# Patient Record
Sex: Female | Born: 1955
Health system: Southern US, Community
[De-identification: ages and names within clinical notes are randomized; demographics above are authoritative.]

## PROBLEM LIST (undated history)

## (undated) DIAGNOSIS — J45909 Unspecified asthma, uncomplicated: Secondary | ICD-10-CM

## (undated) DIAGNOSIS — I499 Cardiac arrhythmia, unspecified: Secondary | ICD-10-CM

## (undated) DIAGNOSIS — G4730 Sleep apnea, unspecified: Secondary | ICD-10-CM

## (undated) DIAGNOSIS — K219 Gastro-esophageal reflux disease without esophagitis: Secondary | ICD-10-CM

## (undated) DIAGNOSIS — F79 Unspecified intellectual disabilities: Secondary | ICD-10-CM

## (undated) DIAGNOSIS — J690 Pneumonitis due to inhalation of food and vomit: Secondary | ICD-10-CM

## (undated) DIAGNOSIS — I459 Conduction disorder, unspecified: Secondary | ICD-10-CM

## (undated) DIAGNOSIS — E039 Hypothyroidism, unspecified: Secondary | ICD-10-CM

## (undated) DIAGNOSIS — M1990 Unspecified osteoarthritis, unspecified site: Secondary | ICD-10-CM

## (undated) DIAGNOSIS — Z950 Presence of cardiac pacemaker: Secondary | ICD-10-CM

## (undated) DIAGNOSIS — I82439 Acute embolism and thrombosis of unspecified popliteal vein: Secondary | ICD-10-CM

## (undated) DIAGNOSIS — R569 Unspecified convulsions: Secondary | ICD-10-CM

## (undated) DIAGNOSIS — Q909 Down syndrome, unspecified: Secondary | ICD-10-CM

## (undated) DIAGNOSIS — I2699 Other pulmonary embolism without acute cor pulmonale: Secondary | ICD-10-CM

## (undated) HISTORY — DX: Hypothyroidism, unspecified: E03.9

## (undated) HISTORY — DX: Sleep apnea, unspecified: G47.30

## (undated) HISTORY — PX: TIBIA FRACTURE SURGERY: SHX806

## (undated) HISTORY — DX: Cardiac arrhythmia, unspecified: I49.9

## (undated) HISTORY — DX: Acute embolism and thrombosis of unspecified popliteal vein: I82.439

## (undated) HISTORY — DX: Unspecified osteoarthritis, unspecified site: M19.90

## (undated) HISTORY — DX: Gastro-esophageal reflux disease without esophagitis: K21.9

## (undated) HISTORY — DX: Unspecified asthma, uncomplicated: J45.909

## (undated) HISTORY — PX: PEG TUBE PLACEMENT: SUR1034

## (undated) HISTORY — DX: Presence of cardiac pacemaker: Z95.0

---

## 2015-12-09 HISTORY — PX: PACEMAKER INSERTION: SHX728

## 2016-05-10 DIAGNOSIS — Q909 Down syndrome, unspecified: Secondary | ICD-10-CM | POA: Insufficient documentation

## 2016-05-10 DIAGNOSIS — E039 Hypothyroidism, unspecified: Secondary | ICD-10-CM | POA: Insufficient documentation

## 2016-05-10 DIAGNOSIS — Z931 Gastrostomy status: Secondary | ICD-10-CM | POA: Insufficient documentation

## 2016-05-10 DIAGNOSIS — Z950 Presence of cardiac pacemaker: Secondary | ICD-10-CM | POA: Insufficient documentation

## 2016-05-10 DIAGNOSIS — Z86711 Personal history of pulmonary embolism: Secondary | ICD-10-CM | POA: Insufficient documentation

## 2016-05-10 DIAGNOSIS — D6489 Other specified anemias: Secondary | ICD-10-CM | POA: Insufficient documentation

## 2016-05-10 DIAGNOSIS — R748 Abnormal levels of other serum enzymes: Secondary | ICD-10-CM | POA: Insufficient documentation

## 2016-05-11 ENCOUNTER — Emergency Department (HOSPITAL_BASED_OUTPATIENT_CLINIC_OR_DEPARTMENT_OTHER): Payer: Medicare (Managed Care)

## 2016-05-11 ENCOUNTER — Emergency Department (HOSPITAL_BASED_OUTPATIENT_CLINIC_OR_DEPARTMENT_OTHER)
Admission: EM | Admit: 2016-05-11 | Discharge: 2016-05-11 | Disposition: A | Discharge status: Home / Self Care | Payer: Medicare (Managed Care) | Attending: Emergency Medicine | Admitting: Emergency Medicine

## 2016-05-11 ENCOUNTER — Encounter (HOSPITAL_BASED_OUTPATIENT_CLINIC_OR_DEPARTMENT_OTHER): Payer: Self-pay | Admitting: *Deleted

## 2016-05-11 DIAGNOSIS — Y929 Unspecified place or not applicable: Secondary | ICD-10-CM | POA: Diagnosis not present

## 2016-05-11 DIAGNOSIS — Q909 Down syndrome, unspecified: Secondary | ICD-10-CM | POA: Insufficient documentation

## 2016-05-11 DIAGNOSIS — S0512XA Contusion of eyeball and orbital tissues, left eye, initial encounter: Secondary | ICD-10-CM

## 2016-05-11 DIAGNOSIS — Y939 Activity, unspecified: Secondary | ICD-10-CM | POA: Diagnosis not present

## 2016-05-11 DIAGNOSIS — Z950 Presence of cardiac pacemaker: Secondary | ICD-10-CM | POA: Diagnosis not present

## 2016-05-11 DIAGNOSIS — Z79899 Other long term (current) drug therapy: Secondary | ICD-10-CM | POA: Insufficient documentation

## 2016-05-11 DIAGNOSIS — R569 Unspecified convulsions: Secondary | ICD-10-CM | POA: Diagnosis present

## 2016-05-11 DIAGNOSIS — Y999 Unspecified external cause status: Secondary | ICD-10-CM | POA: Diagnosis not present

## 2016-05-11 DIAGNOSIS — H05232 Hemorrhage of left orbit: Secondary | ICD-10-CM | POA: Insufficient documentation

## 2016-05-11 DIAGNOSIS — G40909 Epilepsy, unspecified, not intractable, without status epilepticus: Secondary | ICD-10-CM | POA: Insufficient documentation

## 2016-05-11 DIAGNOSIS — W19XXXA Unspecified fall, initial encounter: Secondary | ICD-10-CM

## 2016-05-11 DIAGNOSIS — W07XXXA Fall from chair, initial encounter: Secondary | ICD-10-CM | POA: Insufficient documentation

## 2016-05-11 HISTORY — DX: Other pulmonary embolism without acute cor pulmonale: I26.99

## 2016-05-11 HISTORY — DX: Conduction disorder, unspecified: I45.9

## 2016-05-11 HISTORY — DX: Pneumonitis due to inhalation of food and vomit: J69.0

## 2016-05-11 HISTORY — DX: Unspecified convulsions: R56.9

## 2016-05-11 HISTORY — DX: Down syndrome, unspecified: Q90.9

## 2016-05-11 HISTORY — DX: Unspecified intellectual disabilities: F79

## 2016-05-11 LAB — CBC WITH DIFFERENTIAL/PLATELET
BASOS ABS: 0.1 10*3/uL (ref 0.0–0.1)
Basophils Relative: 1 %
Eosinophils Absolute: 0.1 10*3/uL (ref 0.0–0.7)
Eosinophils Relative: 2 %
HEMATOCRIT: 32.3 % — AB (ref 36.0–46.0)
Hemoglobin: 10.1 g/dL — ABNORMAL LOW (ref 12.0–15.0)
LYMPHS ABS: 1.1 10*3/uL (ref 0.7–4.0)
LYMPHS PCT: 20 %
MCH: 34.1 pg — AB (ref 26.0–34.0)
MCHC: 31.3 g/dL (ref 30.0–36.0)
MCV: 109.1 fL — AB (ref 78.0–100.0)
MONO ABS: 0.5 10*3/uL (ref 0.1–1.0)
Monocytes Relative: 10 %
NEUTROS ABS: 3.7 10*3/uL (ref 1.7–7.7)
Neutrophils Relative %: 67 %
Platelets: 128 10*3/uL — ABNORMAL LOW (ref 150–400)
RBC: 2.96 MIL/uL — AB (ref 3.87–5.11)
RDW: 17.2 % — AB (ref 11.5–15.5)
WBC: 5.4 10*3/uL (ref 4.0–10.5)

## 2016-05-11 LAB — BASIC METABOLIC PANEL
ANION GAP: 9 (ref 5–15)
BUN: 21 mg/dL — AB (ref 6–20)
CO2: 29 mmol/L (ref 22–32)
Calcium: 9.2 mg/dL (ref 8.9–10.3)
Chloride: 98 mmol/L — ABNORMAL LOW (ref 101–111)
Creatinine, Ser: 1.13 mg/dL — ABNORMAL HIGH (ref 0.44–1.00)
GFR calc Af Amer: >60 mL/min (ref >60)
GFR calc non Af Amer: 52 mL/min — ABNORMAL LOW (ref >60)
GLUCOSE: 94 mg/dL (ref 65–99)
POTASSIUM: 4.2 mmol/L (ref 3.5–5.1)
Sodium: 136 mmol/L (ref 135–145)

## 2016-05-11 LAB — CBG MONITORING, ED: GLUCOSE-CAPILLARY: 110 mg/dL — AB (ref 65–99)

## 2016-05-11 NOTE — ED Provider Notes (Signed)
[facility] Provider Note   CSN: [id number] Arrival date & time: 05/11/16  1840     History   Chief Complaint Chief Complaint  Patient presents with  . Fall    HPI Kaitlyn Powell is a 60 y.o. female.  Patient with history of Down syndrome, recent pulmonary embolism on Lovenox, aspiration pneumonia, and seizures presents to the emergency department with chief complaint of seizures. She has company by her family members, state that she had a sudden seizure today. They state that she has occasional seizures despite taking Dilantin. She has been compliant in taking her medications. The seizure lasted only a few seconds. However, the patient's family members were unable to catch the patient, and she fell striking her left forehead. This is the reason that brought her for further evaluation the emergency department today. There are no other associated symptoms.   The history is provided by the patient. No language interpreter was used.    Past Medical History:  Diagnosis Date  . Aspiration pneumonia (HCC)   . Down syndrome   . Heart block   . Mental retardation   . Pulmonary emboli (HCC)   . Seizures (HCC)     There are no active problems to display for this patient.   Past Surgical History:  Procedure Laterality Date  . PACEMAKER INSERTION    . PEG TUBE PLACEMENT    . TIBIA FRACTURE SURGERY Right     OB History    No data available       Home Medications    Prior to Admission medications   Medication Sig Start Date End Date Taking? Authorizing Provider  citalopram (CELEXA) 40 MG tablet Take 40 mg by mouth daily.   Yes Historical Provider, MD  folic acid (FOLVITE) 1 MG tablet Take 1 mg by mouth daily.   Yes Historical Provider, MD  levothyroxine (SYNTHROID, LEVOTHROID) 100 MCG tablet Take 100 mcg by mouth daily before breakfast.   Yes Historical Provider, MD  Melatonin 1 MG/4ML LIQD Take by mouth.   Yes Historical Provider, MD  phenytoin (DILANTIN)  125 MG/5ML suspension Take by mouth 3 (three) times daily.   Yes Historical Provider, MD  simvastatin (ZOCOR) 40 MG tablet Take 40 mg by mouth daily.   Yes Historical Provider, MD  vitamin B-12 (CYANOCOBALAMIN) 100 MCG tablet Take 100 mcg by mouth daily.   Yes Historical Provider, MD    Family History History reviewed. No pertinent family history.  Social History Social History  Substance Use Topics  . Smoking status: Never Smoker  . Smokeless tobacco: Never Used  . Alcohol use No     Allergies   Review of patient's allergies indicates no known allergies.   Review of Systems Review of Systems  Skin: Positive for wound.  Neurological: Positive for seizures.  All other systems reviewed and are negative.    Physical Exam Updated Vital Signs BP 108/56 (BP Location: Right Arm)   Pulse 69   Temp 97.8 F (36.6 C) (Oral)   Resp 18   Ht 4\' 10"  (1.473 m)   Wt 77.1 kg   SpO2 99%   BMI 35.53 kg/m   Physical Exam  Constitutional: She is oriented to person, place, and time. She appears well-developed and well-nourished.  Characteristic features of Down syndrome  HENT:  Head: Normocephalic and atraumatic.  Left supraorbital hematoma, with moderate tenderness to palpation  Eyes: Conjunctivae and EOM are normal. Pupils are equal, round, and reactive to light.  No  evidence of entrapment  Neck: Normal range of motion. Neck supple.  Cardiovascular: Normal rate and regular rhythm.  Exam reveals no gallop and no friction rub.   No murmur heard. Pulmonary/Chest: Effort normal and breath sounds normal. No respiratory distress. She has no wheezes. She has no rales. She exhibits no tenderness.  Lungs sound clear on exam  Abdominal: Soft. Bowel sounds are normal. She exhibits no distension and no mass. There is no tenderness. There is no rebound and no guarding.  Musculoskeletal: Normal range of motion. She exhibits no edema or tenderness.  Moves all extremities throughout range of  motion  Neurological: She is alert and oriented to person, place, and time.  Skin: Skin is warm and dry.  Psychiatric: She has a normal mood and affect. Her behavior is normal. Judgment and thought content normal.  Nursing note and vitals reviewed.    ED Treatments / Results  Labs (all labs ordered are listed, but only abnormal results are displayed) Labs Reviewed  CBC WITH DIFFERENTIAL/PLATELET - Abnormal; Notable for the following:       Result Value   RBC 2.96 (*)    Hemoglobin 10.1 (*)    HCT 32.3 (*)    MCV 109.1 (*)    MCH 34.1 (*)    RDW 17.2 (*)    Platelets 128 (*)    All other components within normal limits  BASIC METABOLIC PANEL - Abnormal; Notable for the following:    Chloride 98 (*)    BUN 21 (*)    Creatinine, Ser 1.13 (*)    GFR calc non Af Amer 52 (*)    All other components within normal limits  CBG MONITORING, ED - Abnormal; Notable for the following:    Glucose-Capillary 110 (*)    All other components within normal limits    EKG  EKG Interpretation  Date/Time:  Monday May 11 2016 18:56:58 EDT Ventricular Rate:  62 PR Interval:    QRS Duration: 92 QT Interval:  431 QTC Calculation: 438 R Axis:   86 Text Interpretation:  Sinus rhythm Biatrial enlargement Low voltage, precordial leads Confirmed by DELO  MD, DOUGLAS ([id number]) on 05/11/2016 7:02:15 PM       Radiology Dg Chest 2 View  Result Date: 05/11/2016 CLINICAL DATA:  Status post seizure and fall out of chair, with central chest pain and shortness of breath. Initial encounter. EXAM: CHEST  2 VIEW COMPARISON:  None. FINDINGS: Vascular congestion is noted, with central airspace opacification, concerning for pulmonary edema. No pleural effusion or pneumothorax is seen. The heart is borderline normal in size; a pacemaker is noted at the right chest wall, with leads ending overlying the right atrium and right ventricle. No acute osseous abnormalities are identified. There is chronic deformity of  the distal left clavicle. IMPRESSION: Vascular congestion, with central airspace opacification, concerning for pulmonary edema. This may be transient in nature. No displaced rib fracture seen. Electronically Signed   By: Roanna Raider M.D.   On: 05/11/2016 20:12   Dg Abdomen 1 View  Result Date: 05/11/2016 CLINICAL DATA:  Status post fall out of chair, left-sided abdominal pain. Seizure. Initial encounter. EXAM: ABDOMEN - 1 VIEW COMPARISON:  None. FINDINGS: The patient's G-tube is noted overlying the left upper quadrant. Its position is not well assessed on supine images. The visualized bowel gas pattern is grossly unremarkable, with a moderate amount of stool noted in the colon. No free abdominal air is seen, though evaluation for free air is limited  on supine views. Mild degenerative change is noted at the lower lumbar spine. The visualized lung bases are grossly clear. IMPRESSION: 1. G-tube noted ending overlying the left upper quadrant. 2. Unremarkable bowel gas pattern; no free intra-abdominal air seen. Moderate amount of stool noted in the colon. Electronically Signed   By: Roanna RaiderJeffery  Chang M.D.   On: 05/11/2016 20:11   Ct Head Wo Contrast  Result Date: 05/11/2016 CLINICAL DATA:  60 year old female with Down's syndrome and intellectual disability with witnessed seizure falling out of chair and hitting head. On blood thinner. Initial encounter. EXAM: CT HEAD WITHOUT CONTRAST TECHNIQUE: Contiguous axial images were obtained from the base of the skull through the vertex without intravenous contrast. COMPARISON:  None. FINDINGS: Brain: No intracranial hemorrhage. Chronic microvascular changes without CT evidence of large acute infarct. Moderate global atrophy without hydrocephalus. No intracranial mass lesion noted on this unenhanced exam. Vascular: Mild vascular calcifications. Skull: Prominent hematoma left frontal/ supraorbital region without underlying skull fracture. Sinuses/Orbits: Globes appear to be  grossly intact. Minimal mucosal thickening right maxillary sinus. Other: Degenerative changes temporomandibular joint. Congenital incomplete fusion C1 ring. Mild dental disease. IMPRESSION: Large left frontal/ supraorbital subcutaneous hematoma without underlying fracture or intracranial hemorrhage. The globe appears to be grossly intact. Moderate global atrophy. Chronic microvascular changes without CT evidence of large acute infarct. Electronically Signed   By: Lacy DuverneySteven  Olson M.D.   On: 05/11/2016 19:32    Procedures Procedures (including critical care time)  Medications Ordered in ED Medications - No data to display   Initial Impression / Assessment and Plan / ED Course  I have reviewed the triage vital signs and the nursing notes.  Pertinent labs & imaging results that were available during my care of the patient were reviewed by me and considered in my medical decision making (see chart for details).  Clinical Course    Patient presents to ED after seizure. She has a hematoma to her left eyebrow. CT scan is negative. PEG tube in place (family was concerned about placement). Chest x-ray shows vascular congestion and opacification. Patient is currently being treated for PE and aspiration pneumonia.  Patient seen by and discussed with Dr. Judd Lienelo, who states that the patient can be discharged. She is well appearing despite her hematoma over her left eye. She is alert and oriented, and seems to be her typical self per the family members.  It is noted that the patient had a few episodes. Oxygen desaturation while on the monitor in the emergency department. This was witnessed by Dr. Judd Lienelo, who states the patient did not have a good waveform at the time, and questions true desaturation.  He states that as patient looks well and sounds clear, she can be discharged.  Final Clinical Impressions(s) / ED Diagnoses   Final diagnoses:  Fall, initial encounter  Traumatic hematoma of left orbit, initial  encounter  Seizure [facility](HCC)    New Prescriptions New Prescriptions   No medications on file     Roxy HorsemanRobert Murray Guzzetta, PA-C 05/11/16 2127    Geoffery Lyonsouglas Delo, MD 05/11/16 2259

## 2016-05-11 NOTE — ED Triage Notes (Addendum)
Pt has a hx of seizures.  Had a witnessed seizure tonight.  Reports that she fell out of the chair and hit her head.  States that she is on Lovenox due to a PE.  Noted to have a left hematoma.

## 2016-05-28 ENCOUNTER — Ambulatory Visit (HOSPITAL_COMMUNITY): Payer: Medicare (Managed Care)

## 2016-07-03 DIAGNOSIS — R569 Unspecified convulsions: Secondary | ICD-10-CM | POA: Insufficient documentation

## 2016-07-03 DIAGNOSIS — H9193 Unspecified hearing loss, bilateral: Secondary | ICD-10-CM | POA: Insufficient documentation

## 2016-07-06 ENCOUNTER — Telehealth: Payer: Self-pay

## 2016-07-06 NOTE — Telephone Encounter (Signed)
SENT NOTES TO SCHEDULING 

## 2016-07-15 ENCOUNTER — Ambulatory Visit (INDEPENDENT_AMBULATORY_CARE_PROVIDER_SITE_OTHER): Payer: Medicare (Managed Care) | Admitting: Podiatry

## 2016-07-15 ENCOUNTER — Encounter: Payer: Self-pay | Admitting: Podiatry

## 2016-07-15 VITALS — BP 110/73 | HR 52 | Ht <= 58 in | Wt 163.0 lb

## 2016-07-15 DIAGNOSIS — B351 Tinea unguium: Secondary | ICD-10-CM

## 2016-07-15 DIAGNOSIS — L608 Other nail disorders: Secondary | ICD-10-CM

## 2016-07-15 DIAGNOSIS — M79609 Pain in unspecified limb: Secondary | ICD-10-CM | POA: Diagnosis not present

## 2016-07-15 DIAGNOSIS — L603 Nail dystrophy: Secondary | ICD-10-CM

## 2016-07-15 DIAGNOSIS — Z79899 Other long term (current) drug therapy: Secondary | ICD-10-CM

## 2016-07-15 NOTE — Progress Notes (Signed)
Subjective: Patient presents today for possible treatment and evaluation of fungal nails bilaterally 1 through 5. Patient states that the nails have been discolored and thickened for greater than 1 month. Patient presents today for further treatment and evaluation.  Objective: Physical Exam General: The patient is alert and oriented x3 in no acute distress.  Dermatology: Hyperkeratotic, discolored, thickened, onychodystrophy of nails noted bilaterally.  Skin is warm, dry and supple bilateral lower extremities. Negative for open lesions or macerations.  Vascular: Palpable pedal pulses bilaterally. No edema or erythema noted. Capillary refill within normal limits.  Neurological: Epicritic and protective threshold grossly intact bilaterally.   Musculoskeletal Exam: Range of motion within normal limits to all pedal and ankle joints bilateral. Muscle strength 5/5 in all groups bilateral.   Assessment: #1 onychodystrophy bilateral toenails #2 possible onychomycosis #3 hyperkeratotic nails bilateral  Plan of Care:  #1 Patient was evaluated. #2 Orders for liver function tests were ordered today.  #3 Today nail biopsy was taken and sent to pathology for fungal culture. #5 patient is to return to clinic in 4 weeks to discuss fungal culture nail biopsy findings and LFT results and discuss different treatment options. #6 if nail fungal culture is positive for fungus we will contact the patient and let her know fungal culture results #7 today mechanical debridement of nails 1 through 5 bilaterally was performed using a nail nipper without incident or bleeding. Nails were smoothed down with a rotary dremel  Felecia ShellingBrent M. Marili Vader, DPM [facility]  Dr. Felecia ShellingBrent M. Vineta Carone, DPM   [address]                                        [place], [place] [address]                Office [phone]  Fax [phone]

## 2016-07-16 NOTE — Addendum Note (Signed)
Addended by: Renaldo ReelPARRY, MELODY A on: 07/16/2016 12:24 PM   Modules accepted: Orders

## 2016-07-22 ENCOUNTER — Encounter: Payer: Self-pay | Admitting: Internal Medicine

## 2016-07-22 ENCOUNTER — Ambulatory Visit
Admission: RE | Admit: 2016-07-22 | Discharge: 2016-07-22 | Disposition: A | Discharge status: Home / Self Care | Payer: Medicare Other | Source: Ambulatory Visit | Attending: Internal Medicine | Admitting: Internal Medicine

## 2016-07-22 ENCOUNTER — Other Ambulatory Visit: Payer: Self-pay | Admitting: Podiatry

## 2016-07-22 ENCOUNTER — Ambulatory Visit (INDEPENDENT_AMBULATORY_CARE_PROVIDER_SITE_OTHER): Payer: Medicare Other | Admitting: Internal Medicine

## 2016-07-22 VITALS — BP 106/70 | HR 64 | Ht <= 58 in | Wt 167.6 lb

## 2016-07-22 DIAGNOSIS — I442 Atrioventricular block, complete: Secondary | ICD-10-CM

## 2016-07-22 DIAGNOSIS — T82110A Breakdown (mechanical) of cardiac electrode, initial encounter: Secondary | ICD-10-CM

## 2016-07-22 DIAGNOSIS — I459 Conduction disorder, unspecified: Secondary | ICD-10-CM | POA: Diagnosis not present

## 2016-07-22 NOTE — Progress Notes (Signed)
ANDY,CAMILLE L, MD:  Kaitlyn MccreedyJeanne L Powell is a 60 y.o. female with a h/o down syndrome and transient complete heart block with syncope sp PPM (BSc) in [place] who presents today to establish care in the Electrophysiology device clinic.  She was quite ill with aspiration pneumonia 8/17.  She has had a slow recovery and has [place] to [place] to live with her sister Kaitlyn BruinsMary Zillich.   She is now doing better.  She has had no further syncope since her PPM implant.  Review of notes from [place] reveal that she has had elevated RV threshold since early June but does not RV pace very often.  Today, she  denies symptoms of palpitations, chest pain, shortness of breath, orthopnea, PND, lower extremity edema, dizziness, presyncope, syncope, or neurologic sequela.  The patientis tolerating medications without difficulties and is otherwise without complaint today.   Past Medical History:  Diagnosis Date  . Aspiration pneumonia (HCC)   . Asthma   . Down syndrome   . GERD (gastroesophageal reflux disease)   . Heart block   . Mental retardation   . Pulmonary emboli (HCC)   . Seizures (HCC)    Past Surgical History:  Procedure Laterality Date  . PACEMAKER INSERTION  12/09/2015   [employer] Accolade MRI EL PPM implanted in [place] for complete heart block and syncope  . PEG TUBE PLACEMENT    . TIBIA FRACTURE SURGERY Right     Social History   Social History  . Marital status: Single    Spouse name: N/A  . Number of children: N/A  . Years of education: N/A   Occupational History  . Not on file.   Social History Main Topics  . Smoking status: Never Smoker  . Smokeless tobacco: Never Used  . Alcohol use No  . Drug use: No  . Sexual activity: Not on file   Other Topics Concern  . Not on file   Social History Narrative  . No narrative on file    No family history on file.  Allergies  Allergen Reactions  . Cephalexin Other (See Comments)    Seizures     Current Outpatient  Prescriptions  Medication Sig Dispense Refill  . citalopram (CELEXA) 20 MG tablet Take one tablet  by mouth  daily    . famotidine (PEPCID) 40 MG/5ML suspension Take 20 mg  by mouth daily    . folic acid (FOLVITE) 1 MG tablet Take 1 mg  by mouth  daily    . ipratropium-albuterol (DUONEB) 0.5-2.5 (3) MG/3ML SOLN Take 1 ampule by nebulization 2 (two) times daily.    Marland Kitchen. levothyroxine (SYNTHROID, LEVOTHROID) 150 MCG tablet Take one tablet (150 mcg total)  by mouth  daily    . Melatonin 1 MG/4ML LIQD Take 3 mg  by mouth  at bedtime    . OXYGEN Inhale 2 L into the lungs as directed. With CPAP    . phenytoin (DILANTIN) 200 MG ER capsule Take 200 mg  by mouth  twice daily    . risperiDONE (RISPERDAL) 1 MG tablet Take 1 mg by mouth every morning and 2 mg at night    . simvastatin (ZOCOR) 40 MG tablet Take 40 mg  by mouth  at bedtime    . vitamin B-12 (CYANOCOBALAMIN) 100 MCG tablet Take 100 mcg by mouth daily     No current facility-administered medications for this visit.     ROS- all systems are reviewed and negative except  as per HPI  Physical Exam: Vitals:   07/22/16 1449  BP: 106/70  Pulse: 64  Weight: 167 lb 9.6 oz (76 kg)  Height: 4\' 10"  (1.473 m)    GEN- The patient is pleasant appearing, alert and oriented x 3 today.   Head- normocephalic, atraumatic Eyes-  Sclera clear, conjunctiva pink Ears- hearing intact Oropharynx- clear with poor dentition Neck- supple, no JVP Lungs- Clear to ausculation bilaterally, normal work of breathing Chest- pacemaker pocket is well healed Heart- Regular rate and rhythm, no murmurs, rubs or gallops, PMI not laterally displaced GI- soft, NT, ND, + BS Extremities- no clubbing, cyanosis, or edema MS- no significant deformity or atrophy Skin- no rash or lesion Psych- euthymic mood, full affect Neuro- strength and sensation are intact  Pacemaker interrogation- reviewed in detail today,  See PACEART report  Assessment and Plan:  1. Transient  complete heart block with syncope Doing well s/p PPM implant No further episodes Normal pacemaker function though RV threshold appears chronically elevated (3V @0 .4 msec since June 2017). See [employer] report No changes today Will obtain CXR to evaluate RV lead placement No plans for lead revision at this time.  Return to see EP NP in 6 months I will see in a year [employer] remote monitoring  Hillis RangeJames Annya Lizana MD, [facility] 07/22/2016 3:25 PM

## 2016-07-22 NOTE — Patient Instructions (Addendum)
Medication Instructions:  Your physician recommends that you continue on your current medications as directed. Please refer to the Current Medication list given to you today.   Labwork: None ordered   Testing/Procedures: A chest x-ray takes a picture of the organs and structures inside the chest, including the heart, lungs, and blood vessels. This test can show several things, including, whether the heart is enlarges; whether fluid is building up in the lungs; and whether pacemaker / defibrillator leads are still in place.    Follow-Up: Remote monitoring is used to monitor your Pacemaker  from home. This monitoring reduces the number of office visits required to check your device to one time per year. It allows us to keep an eye on the functioning of your device to ensure it is working properly. You are scheduled for a device check from home on 10/21/16. You may send your transmission at any time that day. If you have a wireless device, the transmission will be sent automatically. After your physician reviews your transmission, you will receive a postcard with your next transmission date.   Your physician wants you to follow-up in: 6 months with Kaitlyn BalsamAmber Seiler, NP You will receive a reminder letter in the mail two months in advance. If you don't receive a letter, please call our office to schedule the follow-up appointment.     Any Other Special Instructions Will Be Listed Below (If Applicable).     If you need a refill on your cardiac medications before your next appointment, please call your pharmacy.

## 2016-07-23 LAB — HEPATIC FUNCTION PANEL
ALBUMIN: 3.5 g/dL — AB (ref 3.6–4.8)
ALT: 10 IU/L (ref 0–32)
AST: 18 IU/L (ref 0–40)
Alkaline Phosphatase: 117 IU/L (ref 39–117)
Bilirubin, Direct: 0.08 mg/dL (ref 0.00–0.40)
TOTAL PROTEIN: 7.7 g/dL (ref 6.0–8.5)

## 2016-07-28 ENCOUNTER — Telehealth: Payer: Self-pay

## 2016-07-28 NOTE — Telephone Encounter (Signed)
Her results are in and are postive for fungus, look under labs for [facility] results

## 2016-07-28 NOTE — Telephone Encounter (Signed)
Patient calling to see if hepatic function panel results were back.

## 2016-07-28 NOTE — Telephone Encounter (Signed)
I don't see that her nail biopsy fungal culture results are available yet. Do not start on oral Terbinafine until pathology comes back.  Dr. Logan BoresEvans

## 2016-07-29 ENCOUNTER — Other Ambulatory Visit: Payer: Self-pay

## 2016-07-29 MED ORDER — TERBINAFINE HCL 250 MG PO TABS: 250.0000 mg | ORAL_TABLET | Freq: Every day | ORAL | 2 refills | Status: DC

## 2016-08-05 DIAGNOSIS — H906 Mixed conductive and sensorineural hearing loss, bilateral: Secondary | ICD-10-CM | POA: Insufficient documentation

## 2016-08-11 ENCOUNTER — Telehealth: Payer: Self-pay | Admitting: Gastroenterology

## 2016-08-11 NOTE — Telephone Encounter (Signed)
Records have been placed on Dr.Nandigam's desk for review. °

## 2016-08-12 ENCOUNTER — Encounter: Payer: Self-pay | Admitting: Gastroenterology

## 2016-08-12 NOTE — Telephone Encounter (Signed)
Dr. Nandigam reviewed records and has accepted patient. Ok to schedule OV. Left message for patient to return my call. °

## 2016-08-27 LAB — CUP PACEART INCLINIC DEVICE CHECK
Brady Statistic RA Percent Paced: 7 %
Brady Statistic RV Percent Paced: 1 % — CL
Date Time Interrogation Session: 20171220050000
Implantable Lead Implant Date: 20170508
Implantable Lead Location: 753859
Implantable Lead Serial Number: 1111
Lead Channel Pacing Threshold Amplitude: 0.8 V
Lead Channel Pacing Threshold Amplitude: 3.3 V
Lead Channel Pacing Threshold Pulse Width: 0.4 ms
Lead Channel Sensing Intrinsic Amplitude: 6.7 mV
Lead Channel Setting Pacing Amplitude: 5 V
Lead Channel Setting Pacing Pulse Width: 0.4 ms
MDC IDC LEAD IMPLANT DT: 20170508
MDC IDC LEAD LOCATION: 753860
MDC IDC LEAD SERIAL: 1111
MDC IDC MSMT LEADCHNL RA IMPEDANCE VALUE: 579 Ohm
MDC IDC MSMT LEADCHNL RV IMPEDANCE VALUE: 657 Ohm
MDC IDC MSMT LEADCHNL RV PACING THRESHOLD PULSEWIDTH: 0.4 ms
MDC IDC MSMT LEADCHNL RV SENSING INTR AMPL: 17.8 mV
MDC IDC PG IMPLANT DT: 20170508
MDC IDC SET LEADCHNL RA PACING AMPLITUDE: 2.5 V
MDC IDC SET LEADCHNL RV SENSING SENSITIVITY: 2.5 mV
Pulse Gen Serial Number: 747619

## 2016-09-02 ENCOUNTER — Ambulatory Visit (INDEPENDENT_AMBULATORY_CARE_PROVIDER_SITE_OTHER): Payer: Medicare Other | Admitting: Gastroenterology

## 2016-09-02 ENCOUNTER — Encounter: Payer: Self-pay | Admitting: Gastroenterology

## 2016-09-02 VITALS — BP 94/58 | HR 52 | Ht <= 58 in | Wt 161.0 lb

## 2016-09-02 DIAGNOSIS — K5901 Slow transit constipation: Secondary | ICD-10-CM

## 2016-09-02 DIAGNOSIS — R748 Abnormal levels of other serum enzymes: Secondary | ICD-10-CM

## 2016-09-02 DIAGNOSIS — R1312 Dysphagia, oropharyngeal phase: Secondary | ICD-10-CM

## 2016-09-02 DIAGNOSIS — Q909 Down syndrome, unspecified: Secondary | ICD-10-CM

## 2016-09-02 MED ORDER — LINACLOTIDE 72 MCG PO CAPS: 72.0000 ug | ORAL_CAPSULE | Freq: Every day | ORAL | 3 refills | Status: DC

## 2016-09-02 NOTE — Patient Instructions (Signed)
We have give you samples of Linzess 72 mcg to take 1 daily  Follow up as needed

## 2016-09-02 NOTE — Progress Notes (Signed)
Kaitlyn Powell    [id number]    [birth year]  Primary Care Physician:ANDY,CAMILLE L, MD  Referring Physician: Leamon Arnt, MD [address] [place] [place], [place] [address]  Chief complaint:  Constipation   HPI:  10 y is F with Down's syndrome with oropharyngeal dysphagia is here to establish care. Patient has relocated to [facility] and was previously followed by GI in [facility] He was admitted to the hospital in [facility] in September with aspiration pneumonia, pulmonary embolus and had  percutaneous gastrostomy at the time. PEG tube was pulled out in Oct, 2017, as was noted to have infection of the PEG tube site,  was transiently on to feeds through NG Dobhoff for 2 weeks and later she passed swallow test and is currently on pureed diet Other comorbidities include hypothyroidism, DVT, pulmonary embolism treated with anticoagulation for 3 months, Aspiration pneumonia, asthma, obstructive sleep apnea, sick sinus syndrome s/p pacemaker Most recent labs 05/12/16: WBC 5.0, Hgb 10.3, HCT 32.9, Platelets 166, Cr 1.07, Albumin 3.5, T.Bili 0.2, AST 20, ALT 12, Alk Phos 105, Lipase 220, Vit B12 882 She has been off anticoagulation since November 2017. Tolerating by mouth intake well. Denies any nausea, vomiting, abdominal pain, melena or bright red blood per rectum She is accompanied by her sister who is her caregiver, she is concerned about irregular bowel habits and constipation. She is getting MiraLAX and senna as needed with 2-3 bowel movements a week.    Constipation:   Outpatient Encounter Prescriptions as of 09/02/2016  Medication Sig  . citalopram (CELEXA) 20 MG tablet Take one tablet  by mouth  daily  . famotidine (PEPCID) 40 MG/5ML suspension Take 20 mg  by mouth daily  . folic acid (FOLVITE) 1 MG tablet Take 1 mg  by mouth  daily  . ipratropium-albuterol (DUONEB) 0.5-2.5 (3) MG/3ML SOLN Take 1 ampule by nebulization 2 (two) times daily.  Marland Kitchen  levothyroxine (SYNTHROID, LEVOTHROID) 150 MCG tablet Take one tablet (150 mcg total)  by mouth  daily  . Melatonin 1 MG/4ML LIQD Take 3 mg  by mouth  at bedtime  . OXYGEN Inhale 2 L into the lungs as directed. With CPAP  . phenytoin (DILANTIN) 200 MG ER capsule Take 200 mg  by mouth  twice daily  . risperiDONE (RISPERDAL) 1 MG tablet Take 1 mg by mouth every morning and 2 mg at night  . simvastatin (ZOCOR) 40 MG tablet Take 40 mg  by mouth  at bedtime  . vitamin B-12 (CYANOCOBALAMIN) 100 MCG tablet Take 100 mcg by mouth daily  . [DISCONTINUED] terbinafine (LAMISIL) 250 MG tablet Take 1 tablet (250 mg total) by mouth daily.   No facility-administered encounter medications on file as of 09/02/2016.     Allergies as of 09/02/2016 - Review Complete 09/02/2016  Allergen Reaction Noted  . Cephalexin Other (See Comments) 06/04/2016    Past Medical History:  Diagnosis Date  . Arthritis   . Aspiration pneumonia ([place])   . Asthma   . Cardiac arrhythmia    have pacemeaker  . Down syndrome   . GERD (gastroesophageal reflux disease)   . Heart block   . Hypothyroidism   . Mental retardation   . Pulmonary emboli ([place])   . Seizures ([place])   . Sleep apnea with use of continuous positive airway pressure (CPAP)     Past Surgical History:  Procedure Laterality Date  . PACEMAKER INSERTION  12/09/2015   [employer] Accolade MRI EL PPM implanted in [place] for complete heart block and syncope  . PEG TUBE PLACEMENT    . TIBIA FRACTURE SURGERY Right     Family History  Problem Relation Age of Onset  . Diabetes Father   . Heart disease Father   . Breast cancer Sister     Social History   Social History  . Marital status: Single    Spouse name: N/A  . Number of children: N/A  . Years of education: N/A   Occupational History  . Not on file.   Social History Main Topics  . Smoking status: Never Smoker  . Smokeless tobacco: Never Used  . Alcohol use No  . Drug use: No  . Sexual  activity: Not on file   Other Topics Concern  . Not on file   Social History Narrative  . No narrative on file      Review of systems: Review of Systems  Constitutional: Negative for fever and chills.  HENT: Negative.   Eyes: Negative for blurred vision.  Respiratory: Negative for cough, shortness of breath and wheezing.   Cardiovascular: Negative for chest pain and palpitations.  Gastrointestinal: as per HPI Genitourinary: Negative for dysuria, urgency, frequency and hematuria.  Musculoskeletal: Positive for myalgias, back pain and joint pain.  Skin: Negative for itching and rash.  Neurological: Negative for dizziness, tremors, focal weakness, seizures and loss of consciousness.  Endo/Heme/Allergies: Positive for seasonal allergies.  Psychiatric/Behavioral: Negative for depression, suicidal ideas and hallucinations.  All other systems reviewed and are negative.   Physical Exam: Vitals:   09/02/16 0827  BP: (!) 94/58  Pulse: (!) 52   Body mass index is 37.42 kg/m. Gen:      No acute distress HEENT:  EOMI, sclera anicteric Neck:     No masses; no thyromegaly Lungs:    Clear to auscultation bilaterally; normal respiratory effort CV:         Regular rate and rhythm; no murmurs Abd:      + bowel sounds; soft, non-tender; no palpable masses, no distension Ext:    No edema; adequate peripheral perfusion Skin:      Warm and dry; no rash Neuro: alert and oriented x 3 Psych: normal mood and affect  Data Reviewed:  Reviewed labs, radiology imaging, old records and pertinent past GI work up   Assessment and Plan/Recommendations:  61 year-old female with obesity, Down syndrome, hypothyroidism and obstructive sleep apnea is here to establish care  Constipation: Start low-dose linzess 72 g daily, will titrate the dose based on response Increase dietary fluid and fiber  Oropharyngeal dysphagia: She is currently tolerating pured diet  Elevated lipase: Patient has no  symptoms of pancreatitis, noted elevated lipase on prior labs during hospitalization No abdominal pain or renal dysfunction She is currently asymptomatic, no associated LFT abnormality No history of alcohol abuse Could be medication related or idiopathic We'll consider further investigation if she develops any symptoms  Discussed in detail the benefits and risks with colonoscopy for colorectal cancer screening and her sister who is also her caregiver Given her multiple comorbidities including oropharyngeal dysphagia , seizure disorder which puts her at high risk for aspiration, opted to hold off pursuing colonoscopy or any other modality for colorectal cancer screening at this point of time, her sister was in agreement  Greater than 50% of the time used for counseling as well as treatment plan and follow-up. Patient's sister had multiple questions which were  answered to her satisfaction  Damaris Hippo , MD [phone] Mon-Fri 8a-5p [phone] after 5p, weekends, holidays  CC: Leamon Arnt, MD

## 2016-09-21 ENCOUNTER — Encounter: Payer: Self-pay | Admitting: Gastroenterology

## 2016-09-22 ENCOUNTER — Other Ambulatory Visit: Payer: Self-pay

## 2016-09-23 ENCOUNTER — Other Ambulatory Visit: Payer: Self-pay

## 2016-09-23 MED ORDER — SENNA 176 MG/5ML PO SYRP: 10.0000 mL | ORAL_SOLUTION | Freq: Every day | ORAL | 11 refills | Status: DC

## 2016-09-28 ENCOUNTER — Other Ambulatory Visit: Payer: Self-pay

## 2016-09-28 MED ORDER — SENNA 176 MG/5ML PO SYRP: 10.0000 mL | ORAL_SOLUTION | Freq: Every day | ORAL | 11 refills | Status: DC

## 2016-10-14 ENCOUNTER — Ambulatory Visit (INDEPENDENT_AMBULATORY_CARE_PROVIDER_SITE_OTHER): Payer: Medicare Other | Admitting: Podiatry

## 2016-10-14 DIAGNOSIS — M79609 Pain in unspecified limb: Secondary | ICD-10-CM

## 2016-10-14 DIAGNOSIS — B351 Tinea unguium: Secondary | ICD-10-CM | POA: Diagnosis not present

## 2016-10-14 DIAGNOSIS — L608 Other nail disorders: Secondary | ICD-10-CM | POA: Diagnosis not present

## 2016-10-14 DIAGNOSIS — L603 Nail dystrophy: Secondary | ICD-10-CM

## 2016-10-14 MED ORDER — NONFORMULARY OR COMPOUNDED ITEM
1.0000 [drp] | Freq: Every day | 2 refills | Status: DC
Start: 2016-10-14 — End: 2017-01-11

## 2016-10-21 ENCOUNTER — Telehealth: Payer: Self-pay | Admitting: Cardiology

## 2016-10-21 ENCOUNTER — Ambulatory Visit (INDEPENDENT_AMBULATORY_CARE_PROVIDER_SITE_OTHER): Payer: Medicare Other | Admitting: *Deleted

## 2016-10-21 DIAGNOSIS — I442 Atrioventricular block, complete: Secondary | ICD-10-CM

## 2016-10-21 NOTE — Telephone Encounter (Signed)
Confirmed remote transmission w/ pt sister.   

## 2016-10-21 NOTE — Progress Notes (Signed)
Remote pacemaker transmission.   

## 2016-10-22 ENCOUNTER — Encounter: Payer: Self-pay | Admitting: Cardiology

## 2016-10-24 NOTE — Progress Notes (Signed)
   SUBJECTIVE Patient  presents to office today complaining of elongated, thickened nails. Pain while ambulating in shoes. Patient is unable to trim their own nails.   OBJECTIVE General Patient is awake, alert, and oriented x 3 and in no acute distress. Derm Skin is dry and supple bilateral. Negative open lesions or macerations. Remaining integument unremarkable. Nails are tender, long, thickened and dystrophic with subungual debris, consistent with onychomycosis, 1-5 bilateral. No signs of infection noted. Vasc  DP and PT pedal pulses palpable bilaterally. Temperature gradient within normal limits.  Neuro Epicritic and protective threshold sensation diminished bilaterally.  Musculoskeletal Exam No symptomatic pedal deformities noted bilateral. Muscular strength within normal limits.  ASSESSMENT 1. Onychodystrophic nails 1-5 bilateral with hyperkeratosis of nails.  2. Onychomycosis of nail due to dermatophyte bilateral 3. Pain in foot bilateral  PLAN OF CARE 1. Patient evaluated today.  2. Instructed to maintain good pedal hygiene and foot care.  3. Mechanical debridement of nails 1-5 bilaterally performed using a nail nipper. Filed with dremel without incident.  4. Today we discussed different options for fungal nails. We discussed laser treatment, oral terbinafine, and antifungal topical nail lacquer. 5. Prescription for antifungal nail lacquer through [facility] 6. Start terbinafine dry and 50 mg. Patient artery has a prescription. 7. Return to clinic in 6 mos.    [name], DPM [facility]  Dr. [name], DPM    [address]                                        [place], [place] [address]                Office [phone]  Fax [phone]

## 2016-10-26 LAB — CUP PACEART REMOTE DEVICE CHECK
Battery Remaining Percentage: 100 %
Implantable Lead Implant Date: 20170508
Implantable Lead Implant Date: 20170508
Implantable Lead Location: 753860
Implantable Lead Serial Number: 1111
Implantable Pulse Generator Implant Date: 20170508
Lead Channel Impedance Value: 564 Ohm
Lead Channel Impedance Value: 603 Ohm
Lead Channel Pacing Threshold Pulse Width: 0.4 ms
Lead Channel Setting Pacing Amplitude: 5 V
Lead Channel Setting Sensing Sensitivity: 2.5 mV
MDC IDC LEAD LOCATION: 753859
MDC IDC LEAD SERIAL: 1111
MDC IDC MSMT BATTERY REMAINING LONGEVITY: 180 mo
MDC IDC MSMT LEADCHNL RA PACING THRESHOLD AMPLITUDE: 0.5 V
MDC IDC SESS DTM: 20180321164000
MDC IDC SET LEADCHNL RA PACING AMPLITUDE: 2.5 V
MDC IDC SET LEADCHNL RV PACING PULSEWIDTH: 0.4 ms
MDC IDC STAT BRADY RA PERCENT PACED: 17 %
MDC IDC STAT BRADY RV PERCENT PACED: 0 %
Pulse Gen Serial Number: 747619

## 2016-12-30 ENCOUNTER — Encounter: Payer: Self-pay | Admitting: Internal Medicine

## 2016-12-31 DIAGNOSIS — G4733 Obstructive sleep apnea (adult) (pediatric): Secondary | ICD-10-CM | POA: Insufficient documentation

## 2017-01-11 ENCOUNTER — Encounter: Payer: Self-pay | Admitting: Neurology

## 2017-01-11 ENCOUNTER — Ambulatory Visit (INDEPENDENT_AMBULATORY_CARE_PROVIDER_SITE_OTHER): Payer: Medicare Other | Admitting: Neurology

## 2017-01-11 DIAGNOSIS — G40909 Epilepsy, unspecified, not intractable, without status epilepticus: Secondary | ICD-10-CM

## 2017-01-11 DIAGNOSIS — Q909 Down syndrome, unspecified: Secondary | ICD-10-CM

## 2017-01-11 MED ORDER — LEVETIRACETAM 500 MG PO TABS: 1000.0000 mg | ORAL_TABLET | Freq: Two times a day (BID) | ORAL | 11 refills | Status: DC

## 2017-01-11 NOTE — Progress Notes (Signed)
PATIENT: Kaitlyn Powell DOB: 04-26-1956  Chief Complaint  Patient presents with  . New Patient (Initial Visit)    [facility] increase in frequency of drop seizures, PT saw neurolgist in [place]     HISTORICAL  Kaitlyn Powell is a 61 years old left-handed female, accompanied by her sister Kaitlyn BruinsMary Powell, who is also her power of attorney, seen in refer by her primary care physician  Kaitlyn Powell, Kaitlyn L, to evaluate for seizure, initial evaluation was on January 11 2017.  I reviewed and summarized the referring and most recent [facility], she had a history of Down syndrome, asthma, obstructive sleep apnea, on CPAP at night, hypothyroidism, seizure, sick sinus syndrome status post pacemaker, she [facility] March 12 2016 with dyspnea and wheezing, was noted to have low-grade fever 37.6, oxygen saturation was 82%, chest x-ray showed Ldisease, she was treated with Zosyn and Vancomyocin, CT angiograms demonstrate small feeding defect in left lower lobe pulmonary artery, suspicious for PE, she was treated with IV heparin, was also seen for possible congestive heart failure, required ICU admission, daytime BiPAP, she had prolonged ICU stay in for 16 days, she was able to eventually wean off BiPAP, modified swallowing test confirmed swallowing dysfunction with silent aspiration, she was put on NG tube, later switched to Coumadin due to PE, but INR was difficult to stabilize in the therapeutic window,  Her acute hypoxic respiratory failure was thought due to the combination of pulmonary emboli, obstructive sleep apnea, congestive heart failure exacerbation and pneumonia, she was also treated with a short course of Solu-Medrol, echocardiogram August 11 showed ejection fraction of 60-65%, pulmonary hypertension likely secondary to diastolic heart  failure, possible contribution from PE and L space disease,  She was seen by hematologist, who suggested 3 months course of anticoagulation with Lovenox, she had prolonged rehabilitation since April 2017, regained significant recovery, she also noted To have thrombocytopenia, had PEG tube placement, this was placed on April 17 2016 without complication, postprocedure she had mild to moderate generalized abdominal pain, extensive evaluations, including normal or negative UA, hepatic panel, abdominal x-ray, she was able to tolerate the tube feeding well, next  Laboratory evaluations in September 2017, WBC 5.9, hemoglobin of 9.0, creatinine of 0.88,  She also had a lifelong history of seizure, previously was treated with Dilantin, macrocytic anemia with normal B12 level, considered due to prolonged use of Dilantin, there was a concern of interaction between Dilantin and other long-term by mouth anticoagulation treatment such as Coumadin and Kerin SalenXalreto, she was discharged with Lovenox subcutaneous injection, PEG tube was removed in Nov 2017, she is on a mechanical soft diet, thickened liquid, she began to progress to small bite, able to take by mouth's by mouth  Most recent laboratory evaluation in May 2018, hemoglobin 10 point 7, normal CMP, creatinine of 0.96, normal liver functional test, slight elevated d-dimer 1.01  She is currently taking Dilantin 200 mg twice a day, she lives with her sister's family, she has stopped the Lovenox since November 2017,  She suffered seizure all her life, drop seizures, she is now having on daily basis, she had sudden onset of lost muscle tone, loss consciousness, lasting less than 1 minute,   REVIEW OF SYSTEMS: Full 14 system review of systems performed and notable only for shortness of breath, cough, hearing loss, trouble swallowing,  seizure, passing out, allergies, runny nose  ALLERGIES: Allergies  Allergen Reactions  . Cephalexin Other (See Comments)     Seizures     HOME MEDICATIONS: Current Outpatient Prescriptions  Medication Sig Dispense Refill  . albuterol (ACCUNEB) 1.25 MG/3ML nebulizer solution Take 3 mLs (1.25 mg total) by nebulization every 6 (six) hours as needed for Wheezing.    . citalopram (CELEXA) 20 MG tablet GIVE 1 TABLET VIA G TUBE EVERY DAY    . cyanocobalamin (TH VITAMIN B12) 100 MCG tablet Take by mouth.    . famotidine (PEPCID) 40 MG/5ML suspension Take 20 mg  by mouth daily    . folic acid (FOLVITE) 1 MG tablet Take 1 mg  by mouth  daily    . ipratropium-albuterol (DUONEB) 0.5-2.5 (3) MG/3ML SOLN Take 1 ampule by nebulization 2 (two) times daily.    Marland Kitchen levothyroxine (SYNTHROID, LEVOTHROID) 150 MCG tablet Take one tablet (150 mcg total)  by mouth  daily    . Melatonin 1 MG TABS Take by mouth.    . OXYGEN Inhale 2 L into the lungs as directed. With CPAP    . phenytoin (DILANTIN) 200 MG ER capsule Take 200 mg  by mouth  twice daily    . risperiDONE (RISPERDAL) 1 MG tablet Take 1 mg by mouth every morning and 2 mg at night    . simvastatin (ZOCOR) 40 MG tablet Take 40 mg  by mouth  at bedtime     No current facility-administered medications for this visit.     PAST MEDICAL HISTORY: Past Medical History:  Diagnosis Date  . Arthritis   . Aspiration pneumonia ([facility])   . Asthma   . Cardiac arrhythmia    have pacemeaker  . Down syndrome   . GERD (gastroesophageal reflux disease)   . Heart block   . Hypothyroidism   . Mental retardation   . Pulmonary emboli ([facility])   . Seizures ([facility])   . Sleep apnea with use of continuous positive airway pressure (CPAP)     PAST SURGICAL HISTORY: Past Surgical History:  Procedure Laterality Date  . PACEMAKER INSERTION  12/09/2015   [employer] Accolade MRI EL PPM implanted in [place] for complete heart block and syncope  . PEG TUBE PLACEMENT    . TIBIA FRACTURE SURGERY Right     FAMILY HISTORY: Family History  Problem Relation Age of Onset  . Diabetes Father   . Heart  disease Father   . Breast cancer Sister     SOCIAL HISTORY:  Social History   Social History  . Marital status: Single    Spouse name: N/A  . Number of children: N/A  . Years of education: N/A   Occupational History  . disabled    Social History Main Topics  . Smoking status: Never Smoker  . Smokeless tobacco: Never Used  . Alcohol use No  . Drug use: No  . Sexual activity: Not on file   Other Topics Concern  . Not on file   Social History Narrative  . No narrative on file     PHYSICAL EXAM   Vitals:   01/11/17 1041  BP: (!) 105/58  Pulse: (!) 51  Weight: 158 lb 8 oz (71.9 kg)  Height: 4\' 10"  (1.473 m)    Not recorded      Body mass index is 33.13 kg/m.  PHYSICAL EXAMNIATION:  Gen: NAD, conversant, well nourised, obese, well groomed  Cardiovascular: Regular rate rhythm, no peripheral edema, warm, nontender. Eyes: Conjunctivae clear without exudates or hemorrhage Neck: Supple, no carotid bruits. Pulmonary: Clear to auscultation bilaterally   NEUROLOGICAL EXAM:  MENTAL STATUS: Speech/Cognition: Slurred speech, facial feature for people with Down syndrome, follow commands and exam CRANIAL NERVES: CN II: Visual fields are full to confrontation. Fundoscopic exam is normal with sharp discs and no vascular changes. Pupils are round equal and briskly reactive to light. CN III, IV, VI: extraocular movement are normal. No ptosis. CN V: Facial sensation is intact to pinprick in all 3 divisions bilaterally. Corneal responses are intact.  CN VII: Face is symmetric with normal eye closure and smile. CN VIII: Hearing is normal to rubbing fingers CN IX, X: Palate elevates symmetrically. Phonation is normal. CN XI: Head turning and shoulder shrug are intact CN XII: Tongue is midline with normal movements and no atrophy.  MOTOR: There is no pronator drift of out-stretched arms. Muscle bulk and tone are normal. Muscle strength is  normal.  REFLEXES: Reflexes are 2+ and symmetric at the biceps, triceps, knees, and ankles. Plantar responses are flexor.  SENSORY: Intact to light touch, pinprick, positional sensation and vibratory sensation are intact in fingers and toes.  COORDINATION: Rapid alternating movements and fine finger movements are intact. There is no dysmetria on finger-to-nose and heel-knee-shin.    GAIT/STANCE: Mildly unsteady   DIAGNOSTIC DATA (LABS, IMAGING, TESTING) - I reviewed patient records, labs, notes, testing and imaging myself where available.   ASSESSMENT AND PLAN  CAEDYN TASSINARI is a 61 y.o. female   Down syndrome Epilepsy  Complete evaluation with MRI of the brain with without contrast  EEG  Suboptimal control with Dilantin 200 mg twice a day, worry about medication interaction, will stop Dilantin, start Keppra 500 mg 2 tablets twice a day   Levert Feinstein, M.D. Ph.D.  [facility] [address] [place], [place] [address] Ph: [phone] Fax: [phone]  CC:Kaitlyn Ora, Powell

## 2017-01-13 ENCOUNTER — Telehealth: Payer: Self-pay | Admitting: Neurology

## 2017-01-13 NOTE — Telephone Encounter (Signed)
Already sent mychart message with instructions below this am.

## 2017-01-13 NOTE — Telephone Encounter (Signed)
Patient is scheduled for her MRI for [facility]/20/17 at [facility] for Wednesday 01/20/17. Patient sister [name] is aware of date and time and is good with the appointment. Medicaid auth: NPR Reference # [id number] on 01/13/17.

## 2017-01-13 NOTE — Telephone Encounter (Signed)
Please call or email patient's sister,  She should take Keppra 500 mg 2 tablets twice a day Dilantin 100 mg 2 tablets every night for one week Then Dilantin 100 mg one tablet every night for one week Then stop Dilantin  Keep Keppra 500 mg 2 tablets twice a day until next follow-up visit in September 2018.

## 2017-01-13 NOTE — Telephone Encounter (Signed)
I just got off the phone with patients sister Kaitlyn Powell (on dpr) because Kaitlyn Powell has a pacemaker and [facility] reached out to me and informed me they will not be able to schedule her MRI there do to the pace maker.. I informed her I could schedule [facility].. She also informed me that she sent a  my chart message to Dr. Terrace ArabiaYan on 01/11/17 about her sister medicine.

## 2017-01-15 MED ORDER — PHENYTOIN SODIUM EXTENDED 100 MG PO CAPS: 100.0000 mg | ORAL_CAPSULE | Freq: Every day | ORAL | 0 refills | Status: DC

## 2017-01-18 ENCOUNTER — Encounter: Payer: Self-pay | Admitting: Internal Medicine

## 2017-01-18 ENCOUNTER — Ambulatory Visit (INDEPENDENT_AMBULATORY_CARE_PROVIDER_SITE_OTHER): Payer: Medicare Other | Admitting: Internal Medicine

## 2017-01-18 VITALS — BP 110/60 | HR 54 | Ht <= 58 in | Wt 158.4 lb

## 2017-01-18 DIAGNOSIS — I442 Atrioventricular block, complete: Secondary | ICD-10-CM | POA: Diagnosis not present

## 2017-01-18 DIAGNOSIS — T82110A Breakdown (mechanical) of cardiac electrode, initial encounter: Secondary | ICD-10-CM

## 2017-01-18 NOTE — Progress Notes (Signed)
PCP: Kaitlyn Powell, Kaitlyn L, MD  Kaitlyn Powell is a 61 y.o. female who presents today for routine electrophysiology followup.  Since last being seen in our clinic, the patient reports doing very well.  Today, she denies symptoms of palpitations, chest pain, shortness of breath,  lower extremity edema, dizziness, presyncope, or syncope.  The patient is otherwise without complaint today.   Past Medical History:  Diagnosis Date  . Arthritis   . Aspiration pneumonia ([facility])   . Asthma   . Cardiac arrhythmia    have pacemeaker  . Down syndrome   . GERD (gastroesophageal reflux disease)   . Heart block   . Hypothyroidism   . Mental retardation   . Pulmonary emboli ([facility])   . Seizures ([facility])   . Sleep apnea with use of continuous positive airway pressure (CPAP)    Past Surgical History:  Procedure Laterality Date  . PACEMAKER INSERTION  12/09/2015   [employer] Scientific Accolade MRI EL PPM implanted in [place] for complete heart block and syncope  . PEG TUBE PLACEMENT    . TIBIA FRACTURE SURGERY Right     ROS- all systems are reviewed and negative except as per HPI above  Current Outpatient Prescriptions  Medication Sig Dispense Refill  . citalopram (CELEXA) 20 MG tablet GIVE 1 TABLET VIA G TUBE EVERY DAY    . cyanocobalamin (TH VITAMIN B12) 100 MCG tablet Take 100 mcg by mouth every morning.     . famotidine (PEPCID) 40 MG/5ML suspension Take 20 mg  by mouth daily    . folic acid (FOLVITE) 1 MG tablet Take 1 mg  by mouth  daily    . ipratropium-albuterol (DUONEB) 0.5-2.5 (3) MG/3ML SOLN Take 1 ampule by nebulization 2 (two) times daily.    Marland Kitchen. levETIRAcetam (KEPPRA) 500 MG tablet Take 2 tablets (1,000 mg total) by mouth 2 (two) times daily. 120 tablet 11  . levothyroxine (SYNTHROID, LEVOTHROID) 150 MCG tablet Take one tablet (150 mcg total)  by mouth  daily    . Melatonin 1 MG TABS Take 1 mg by mouth at bedtime.     . OXYGEN Inhale 2 Powell into the lungs as directed. With CPAP    . phenytoin (DILANTIN)  100 MG ER capsule Take 1 capsule (100 mg total) by mouth at bedtime. 7 capsule 0  . phenytoin (DILANTIN) 200 MG ER capsule Take 200 mg  by mouth at dinner time    . risperiDONE (RISPERDAL) 1 MG tablet Take 1 mg by mouth every morning and 2 mg at night    . senna (SENOKOT) 176 MG/5ML SYRP as directed. As needed for stomach    . simvastatin (ZOCOR) 40 MG tablet Take 40 mg  by mouth  at bedtime     No current facility-administered medications for this visit.     Physical Exam: Vitals:   01/18/17 1438  BP: 110/60  Pulse: (!) 54  SpO2: 93%  Weight: 158 lb 6.4 oz (71.8 kg)  Height: 4\' 10"  (1.473 m)    GEN- The patient is well appearing, alert and oriented x 3 today.   Head- normocephalic, atraumatic Eyes-  Sclera clear, conjunctiva pink Ears- hearing intact Oropharynx- clear Lungs- Clear to ausculation bilaterally, normal work of breathing Chest- pacemaker pocket is well healed Heart- Regular rate and rhythm, no murmurs, rubs or gallops, PMI not laterally displaced GI- soft, NT, ND, + BS Extremities- no clubbing, cyanosis, or edema  Pacemaker interrogation- reviewed in detail today,  See PACEART report  Assessment and Plan:  1. Transient complete heart block with syncope Resolved with PPM RV threshold is chronically elevated though she does not RV pace currently Will follow without revision at this point Normal pacemaker function See Arita Miss Art report  Return to see me in a year [place] remote monitoring  Hillis Range MD, [facility] 01/18/2017 3:07 PM

## 2017-01-18 NOTE — Patient Instructions (Addendum)
Medication Instructions:  Your physician recommends that you continue on your current medications as directed. Please refer to the Current Medication list given to you today.   Labwork: None ordered   Testing/Procedures: None ordered   Follow-Up: Remote monitoring is used to monitor your Pacemaker from home. This monitoring reduces the number of office visits required to check your device to one time per year. It allows us to keep an eye on the functioning of your device to ensure it is working properly. You are scheduled for a device check from home on 04/19/17. You may send your transmission at any time that day. If you have a wireless device, the transmission will be sent automatically. After your physician reviews your transmission, you will receive a postcard with your next transmission date.   Your physician wants you to follow-up in: 12 months with Dr. Johney FrameAllred.  You will receive a reminder letter in the mail two months in advance. If you don't receive a letter, please call our office to schedule the follow-up appointment.    Any Other Special Instructions Will Be Listed Below (If Applicable).     If you need a refill on your cardiac medications before your next appointment, please call your pharmacy.

## 2017-01-20 ENCOUNTER — Ambulatory Visit (HOSPITAL_COMMUNITY): Payer: Medicare Other

## 2017-01-21 ENCOUNTER — Telehealth: Payer: Self-pay | Admitting: Neurology

## 2017-01-21 ENCOUNTER — Institutional Professional Consult (permissible substitution): Payer: Medicare Other | Admitting: Pulmonary Disease

## 2017-01-21 MED ORDER — LEVETIRACETAM 500 MG PO TABS: 1500.0000 mg | ORAL_TABLET | Freq: Two times a day (BID) | ORAL | 0 refills | Status: DC

## 2017-01-21 NOTE — Telephone Encounter (Signed)
[facility] - says during the time of these seizures she was on the following medications: 1) Dilantin 100mg  ER, one tab in evening (in the process of tapering off) 2) Keppra 1000mg , BID  Dr. Lucia GaskinsAhern is work-in MD this afternoon.  She has instructed the following: 1) continue to taper off Dilantin, as prescribed 2) Increase Keppra to 1500mg . BID.  New rx sent to pharmacy.  Kaitlyn Powell is agreeable to the medication changed and verbalized understanding.

## 2017-01-21 NOTE — Addendum Note (Signed)
Addended by: Lilla ShookKIRKMAN, MICHELLE C on: 01/21/2017 06:35 PM   Modules accepted: Orders

## 2017-01-21 NOTE — Telephone Encounter (Signed)
Patients sister [name] (listed on DPR) called office in reference to phenytoin (DILANTIN) 100 MG ER capsule and levETIRAcetam (KEPPRA) 500 MG tablet.  Patient is week 2 of Keppra and next Wednesday Dilantin will be completed.  Patient had a seizure last week in the middle of the night convulsing bad, patient had another one today while using the restroom convulsing again  Please call

## 2017-01-25 NOTE — Telephone Encounter (Signed)
Spoke to Kaitlyn Powell - states her sister has been having tonic clonic seizures now rather than drop seizures.  Dr. Marjory LiesPenumalli is the work-in MD this morning and he has instructed the following:  1) continue Keppra 1500mg , BID 2) increase Dilantin ER back up to 200mg  daily (they enough med at home) 3) call us back in one week for an update then a further decision will be made about the Dilantin ER dose. 4) call us earlier for any seizure activity  Kaitlyn Powell is agreeable to this plan and verbalized understanding.

## 2017-01-25 NOTE — Telephone Encounter (Signed)
[name] patient;s sister is calling. She states patient had 2 seizures yesterday and needs to be seen today.

## 2017-01-25 NOTE — Telephone Encounter (Signed)
Left message for a return call

## 2017-01-25 NOTE — Telephone Encounter (Signed)
[name] returning your call

## 2017-01-28 ENCOUNTER — Other Ambulatory Visit: Payer: Self-pay | Admitting: *Deleted

## 2017-01-28 NOTE — Telephone Encounter (Signed)
I spoke with patient's sister. Since starting Keppra, before first breakthrough generalized seizure, patient appeared to be overmedicated/sedated. When Dilantin was decreased patient had first through generalized seizure. Since that time patient continues to be somewhat oversedated. Therefore we will return to patient's baseline Dilantin 200 mg twice a day, which helped keep generalized seizures under control for many years. We will also taper Keppra down to a lower dose to help reduce side effect of sedation. The patient continues to have repeated generalized seizures of the weekend, I have instructed family to take her to the emergency room for more immediate treatment.  PLAN: - Will restart dilantin 200mg  twice a day. - Tonight give keppra 1000mg  tonight. - Tomorrow start keppra 500mg  twice a day.    Suanne MarkerVIKRAM R. PENUMALLI, MD 01/28/2017, 4:42 PM Certified in Neurology, Neurophysiology and Neuroimaging  [facility] [address], [address] [place], [place] [address] [phone]

## 2017-01-28 NOTE — Telephone Encounter (Signed)
Per vo by Dr. Marjory LiesPenumalli, instruct the following: 1) continue Keppra 1500mg , BID 2) go back to previous dose of Dilantin ER 200mg , BID  Keep her pending EEG appt on 01/29/17.

## 2017-01-28 NOTE — Telephone Encounter (Signed)
Spoke to [place] (pt's primary caregiver) - she is concerned about her sister's recent medication changes.  She had two additional tonic clonic seizures today (both while having a bowel movement).  Also, reports she has developed a stuttering speech and excessive drooling.  She is currently taking Keppra 1500mg , BID and Dilantin ER 200mg  daily.

## 2017-01-28 NOTE — Telephone Encounter (Signed)
Patients sister [name] called office in reference to patient having 2 small seizures today again when patient is in the bathroom.  Patient is lethargic and tired  [name] is not happy with the medication and would like to speak with RN.  Please call

## 2017-01-28 NOTE — Telephone Encounter (Signed)
Spoke to [place] - she is uncomfortable to continue her sister on Keppra.  States it has been 30+ years since she had a tonic clonic seizure.  She would like MD to return her call.

## 2017-01-29 ENCOUNTER — Ambulatory Visit (INDEPENDENT_AMBULATORY_CARE_PROVIDER_SITE_OTHER): Payer: Medicare Other | Admitting: Diagnostic Neuroimaging

## 2017-01-29 DIAGNOSIS — Q909 Down syndrome, unspecified: Secondary | ICD-10-CM

## 2017-01-29 DIAGNOSIS — G40909 Epilepsy, unspecified, not intractable, without status epilepticus: Secondary | ICD-10-CM

## 2017-02-01 ENCOUNTER — Ambulatory Visit (HOSPITAL_COMMUNITY)
Admission: RE | Admit: 2017-02-01 | Discharge: 2017-02-01 | Disposition: A | Discharge status: Home / Self Care | Payer: Medicare Other | Source: Ambulatory Visit | Attending: Neurology | Admitting: Neurology

## 2017-02-01 DIAGNOSIS — G40909 Epilepsy, unspecified, not intractable, without status epilepticus: Secondary | ICD-10-CM

## 2017-02-01 DIAGNOSIS — Q909 Down syndrome, unspecified: Secondary | ICD-10-CM | POA: Insufficient documentation

## 2017-02-01 LAB — CREATININE, SERUM
Creatinine, Ser: 1 mg/dL (ref 0.44–1.00)
GFR calc Af Amer: >60 mL/min (ref >60)
GFR, EST NON AFRICAN AMERICAN: 60 mL/min — AB (ref >60)

## 2017-02-01 MED ORDER — GADOBENATE DIMEGLUMINE 529 MG/ML IV SOLN
15.0000 mL | Freq: Once | INTRAVENOUS | Status: AC
  Administered 2017-02-01: 15 mL via INTRAVENOUS

## 2017-02-04 NOTE — Procedures (Signed)
   [facility]  EEG (ELECTROENCEPHALOGRAM) REPORT   STUDY DATE: 01/29/17 PATIENT NAME: Kaitlyn Powell DOB: 1956-03-11 MRN: [id number]  ORDERING CLINICIAN: Levert FeinsteinYijun Yan, MD PhD   TECHNOLOGIST: Charlesetta IvoryBeau Handy TECHNIQUE: Electroencephalogram was recorded utilizing standard 10-20 system of lead placement and reformatted into average and bipolar montages.  RECORDING TIME: 20 minutes  ACTIVATION: none  CLINICAL INFORMATION: 61 year old female with down syndrome and seizures.  FINDINGS: Background rhythms of mixed frequencies: 2-3, 3-4, 5-6 hertz and 50-60 microvolts. Rare generalized spike and wave discharges (2-3 hertz) are noted. No electrographic seizures are seen. Patient recorded in the awake and drowsy state. EKG channel shows 50-60 beats per minute.  IMPRESSION:  Abnormal EEG in the awake and drowsy states demonstrating: 1. Moderate-severe slowing consistent with moderate-severe encephalopathy. 2. Rare generalized spike and wave discharges (2-3 hertz) are noted.  3. No electrographic seizures are seen.     INTERPRETING PHYSICIAN:  Suanne MarkerVIKRAM R. Almer Bushey, MD Certified in Neurology, Neurophysiology and Neuroimaging  [facility] [address], [address] [place], [place] [address] [phone]

## 2017-02-08 ENCOUNTER — Other Ambulatory Visit: Payer: Self-pay | Admitting: Obstetrics & Gynecology

## 2017-02-08 ENCOUNTER — Telehealth: Payer: Self-pay | Admitting: Neurology

## 2017-02-08 DIAGNOSIS — R928 Other abnormal and inconclusive findings on diagnostic imaging of breast: Secondary | ICD-10-CM

## 2017-02-08 LAB — CUP PACEART INCLINIC DEVICE CHECK
Date Time Interrogation Session: 20180618040000
Implantable Lead Implant Date: 20170508
Implantable Lead Location: 753859
Implantable Lead Model: 7740
Implantable Lead Serial Number: 1111
Implantable Pulse Generator Implant Date: 20170508
Lead Channel Pacing Threshold Amplitude: 2.5 V
Lead Channel Pacing Threshold Pulse Width: 1 ms
Lead Channel Sensing Intrinsic Amplitude: 14.3 mV
Lead Channel Setting Pacing Amplitude: 2 V
Lead Channel Setting Pacing Amplitude: 5 V
Lead Channel Setting Pacing Pulse Width: 1 ms
Lead Channel Setting Sensing Sensitivity: 2.5 mV
MDC IDC LEAD IMPLANT DT: 20170508
MDC IDC LEAD LOCATION: 753860
MDC IDC LEAD SERIAL: 1111
MDC IDC MSMT LEADCHNL RA IMPEDANCE VALUE: 567 Ohm
MDC IDC MSMT LEADCHNL RA PACING THRESHOLD AMPLITUDE: 0.8 V
MDC IDC MSMT LEADCHNL RA PACING THRESHOLD PULSEWIDTH: 0.4 ms
MDC IDC MSMT LEADCHNL RA SENSING INTR AMPL: 4.6 mV
MDC IDC MSMT LEADCHNL RV IMPEDANCE VALUE: 636 Ohm
MDC IDC PG SERIAL: 747619
MDC IDC STAT BRADY RA PERCENT PACED: 22 %
MDC IDC STAT BRADY RV PERCENT PACED: 1 % — AB

## 2017-02-08 NOTE — Telephone Encounter (Signed)
Patients sister called and stated that she had seen the results from the patients EEG posted on her mychart and she is concerned and would like to discuss this with Nadeen LandauMichelle RN. Please call and advise.

## 2017-02-08 NOTE — Telephone Encounter (Signed)
Left message for a return call

## 2017-02-08 NOTE — Telephone Encounter (Signed)
Patients sister returned call

## 2017-02-08 NOTE — Progress Notes (Signed)
Please call sister and advise her that patient's EEG or brain wave test we performed was reported as showing global slowing, which is a nonspecific finding and can be seen in patients with medication related sleepiness, lethargy due to an underlying medical illness, electrolyte disturbance, mental retardation or developmental delay. While there was no evidence of active Seizures, there were occasional abnormal electrical discharges in the brain waves, indicating risk for Sz or lower threshold for Sx. Continue AED and FU as per Dr. Terrace ArabiaYan.  No further action is required on this test at this time. Please remind patient's sister about Sz precautions and Sz triggers: Sleep deprivation, dehydration, overheating, stress, hypoglycemia or skipping meals, certain medications or excessive alcohol use, especially stopping alcohol abruptly if you have had heavy alcohol use before (aka alcohol withdrawal seizure). If you have a prolonged seizure over 2-5 minutes or back to back seizures, call or have someone call 911 or take you to the nearest emergency room. You cannot drive a car or operate any other machinery or vehicle within 6 months of a seizure. Please do not swim alone or take a tub bath for safety. Do not climb on ladders or be at heights alone. Do not cook with large quantities of boiling water or oil for safety. Please ensure the water temperature at home is not too high. When carrying or caring for small children and infants, make sure you sit down when holding the child are feeding the child or changing them to minimize risk for injury to the child are to you if you were to have a seizure.  Take your medicine for seizure prevention regularly and do not skip doses or stop medication abruptly and tone are told to do so by your healthcare provider. Try to get a refill on your antiepileptic medication ahead of time, so you are not at risk of running out. If you run out of the seizure medication and do not have a refill  at hand she may run into medication withdrawal seizures. Avoid taking Wellbutrin, narcotic pain medications and tramadol, as they can lower seizure threshold.  As per [facility] [place] D[place] statutes, patients with seizures and epilepsy are not allowed to drive until they have been seizure-free for at least 6 months. This also applies to driving or using heavy equipment or power tools. to keep any upcoming appointments or tests and to call us with any interim questions, concerns, problems or updates. Thanks,  Huston FoleySaima Trenten Watchman, MD, PhD

## 2017-02-08 NOTE — Telephone Encounter (Signed)
Spoke to her sister, Kaitlyn Powell - she is aware of results.  They would like an earlier appt to further discuss her sister's care. She has been scheduled on 02/17/17.  Please call sister and advise her that patient's EEG or brain wave test we performed was reported as showing global slowing, which is a nonspecific finding and can be seen in patients with medication related sleepiness, lethargy due to an underlying medical illness, electrolyte disturbance, mental retardation or developmental delay. While there was no evidence of active Seizures, there were occasional abnormal electrical discharges in the brain waves, indicating risk for Sz or lower threshold for Sx. Continue AED and FU as per Dr. Terrace ArabiaYan.  No further action is required on this test at this time. Please remind patient's sister about Sz precautions and Sz triggers: Sleep deprivation, dehydration, overheating, stress, hypoglycemia or skipping meals, certain medications or excessive alcohol use, especially stopping alcohol abruptly if you have had heavy alcohol use before (aka alcohol withdrawal seizure). If you have a prolonged seizure over 2-5 minutes or back to back seizures, call or have someone call 911 or take you to the nearest emergency room. You cannot drive a car or operate any other machinery or vehicle within 6 months of a seizure. Please do not swim alone or take a tub bath for safety. Do not climb on ladders or be at heights alone. Do not cook with large quantities of boiling water or oil for safety. Please ensure the water temperature at home is not too high. When carrying or caring for small children and infants, make sure you sit down when holding the child are feeding the child or changing them to  minimize risk for injury to the child are to you if you were to have a seizure.  Take your medicine for seizure prevention regularly and do not skip doses or stop medication abruptly and tone are told to do so by your healthcare provider. Try to  get a refill on your antiepileptic medication ahead of time, so you are not at risk of running out. If you run out of the seizure medication and do not have a refill at hand she may run into medication withdrawal seizures. Avoid taking Wellbutrin, narcotic pain medications and tramadol, as they can lower seizure threshold.  As per [facility] [place] DMP statutes, patients with seizures and epilepsy are not allowed to drive until they have been seizure-free for at least 6 months. This also applies to driving or using heavy equipment or power tools.  to keep any upcoming appointments or tests and to call us with any interim questions, concerns, problems or updates. Thanks,  Huston FoleySaima Athar, MD, PhD

## 2017-02-10 ENCOUNTER — Ambulatory Visit
Admission: RE | Admit: 2017-02-10 | Discharge: 2017-02-10 | Disposition: A | Discharge status: Home / Self Care | Payer: Medicare Other | Source: Ambulatory Visit | Attending: Obstetrics & Gynecology | Admitting: Obstetrics & Gynecology

## 2017-02-10 DIAGNOSIS — R928 Other abnormal and inconclusive findings on diagnostic imaging of breast: Secondary | ICD-10-CM

## 2017-02-10 NOTE — Telephone Encounter (Signed)
Spoke to patient's sister to provide EEG results.  She requested an earlier appt and it has been moved to 02/17/17.

## 2017-02-11 ENCOUNTER — Ambulatory Visit (INDEPENDENT_AMBULATORY_CARE_PROVIDER_SITE_OTHER)
Admission: RE | Admit: 2017-02-11 | Discharge: 2017-02-11 | Disposition: A | Discharge status: Home / Self Care | Payer: Medicare Other | Source: Ambulatory Visit | Attending: Pulmonary Disease | Admitting: Pulmonary Disease

## 2017-02-11 ENCOUNTER — Institutional Professional Consult (permissible substitution): Payer: Medicare Other | Admitting: Pulmonary Disease

## 2017-02-11 ENCOUNTER — Telehealth: Payer: Self-pay | Admitting: Pulmonary Disease

## 2017-02-11 ENCOUNTER — Ambulatory Visit (INDEPENDENT_AMBULATORY_CARE_PROVIDER_SITE_OTHER): Payer: Medicare Other | Admitting: Pulmonary Disease

## 2017-02-11 ENCOUNTER — Encounter: Payer: Self-pay | Admitting: Pulmonary Disease

## 2017-02-11 VITALS — BP 120/60 | HR 56 | Ht <= 58 in | Wt 158.0 lb

## 2017-02-11 DIAGNOSIS — J690 Pneumonitis due to inhalation of food and vomit: Secondary | ICD-10-CM | POA: Diagnosis not present

## 2017-02-11 DIAGNOSIS — G4734 Idiopathic sleep related nonobstructive alveolar hypoventilation: Secondary | ICD-10-CM

## 2017-02-11 DIAGNOSIS — R1310 Dysphagia, unspecified: Secondary | ICD-10-CM | POA: Diagnosis not present

## 2017-02-11 DIAGNOSIS — G4733 Obstructive sleep apnea (adult) (pediatric): Secondary | ICD-10-CM

## 2017-02-11 DIAGNOSIS — R05 Cough: Secondary | ICD-10-CM

## 2017-02-11 DIAGNOSIS — R059 Cough, unspecified: Secondary | ICD-10-CM

## 2017-02-11 NOTE — Patient Instructions (Addendum)
For your recurrent aspiration pneumonia: We will check a chest x-ray today We will refer you to speech therapy If you develop fever, chills, lethargy, shortness of breath, or cough with mucus production then let me know right away because this could be pneumonia  For your obstructive sleep apnea: Keep using BiPAP with oxygen at night We will check her oxygen level while you are sleeping  Cough: I think this is due to gastroesophageal reflux disease Ask the pharmacist if it's okay to take pantoprazole in the morning.  For your asthma: Keep using albuterol on an as-needed basis for shortness of breath, I think the way you're doing it now is fine  We will see you back in 4 months or sooner if needed

## 2017-02-11 NOTE — Addendum Note (Signed)
Addended by: Maxwell MarionBLANKENSHIP, MARGIE A on: 02/11/2017 12:53 PM   Modules accepted: Orders

## 2017-02-11 NOTE — Telephone Encounter (Signed)
Called [name]/[facility] regarding patient's O2 order.  Since the patient has a documented OSA they cannot have an ONO and must go into the sleep lab for a Titration Study on Bipap to prove need for O2.  Will send to Dr Kendrick FriesMcQuaid for the okay to place the the order.  Order will need to request that a family member be accepted to stay the night with her.  Please advise. Thanks.

## 2017-02-11 NOTE — Progress Notes (Signed)
Subjective:    Patient ID: Kaitlyn Powell, female    DOB: 19-Dec-1955, 61 y.o.   MRN: [id number]  Synopsis: Referred in July 2018 for evaluation of asthma and pulmonary embolism. The pulmonary embolism was diagnosed in 2017.  That is her only PE and it was provoked by being sedentary in a wheelchair.  She has a significant history of aspiration pneumonia.  Has a past medical history significant for Down syndrome. Had a PEG tube which was removed in November 2017.  She has used BIPAP with oxygen since 2008.   HPI Chief Complaint  Patient presents with  . Pulmonary Consult    Self referral for family history of PE and asthma. Pt c/o SOB with rest and activity, dry cough. Pt states she has occassional chest pain. Pt denies f/c/s.    Kaitlyn Powell ("Jee-nee") is here with her sister to establish care.  They note a dry cough which has been a problem for her for a few months.  The family notes that her oxygen level will drop down to the low 90's and they use oxygen sometimes during the daytime to treat her.  They also give a fair amount of albuterol during the day (2-3x per day).  If she is not doing well she will cough more, she nearly never coughs up mucus.  The family tries to watch for signs of dyspnea when she walks with them.  Her brother in law is a nurse who will listen to her lungs.    There has been concern for aspiration and she still has a couple of episodes of overt aspiration while eating in the last few months.  She had worked with speech therapy quite a bit.  They feel that her dysphagia is related to her Down's syndrome.  She eats mechanical soft and pureed diet.  She drinks honey thickened liquids.     She has been living with her sister since 04/2016. Prior that she lived in a group home for 30+ years.  She apparently had an infection around the PEG In 05/2016 and had Keflex that was associated with a seizure.  In November 2017 she had a normal swallowing test and had the PEG removed.     She uses the BIPAP every night.  Apparently she needs a very special mask.  She is very compliant with the mask.    Past Medical History:  Diagnosis Date  . Arthritis   . Aspiration pneumonia (HCC)   . Asthma   . Cardiac arrhythmia    have pacemeaker  . Down syndrome   . GERD (gastroesophageal reflux disease)   . Heart block   . Hypothyroidism   . Mental retardation   . Pulmonary emboli (HCC)   . Seizures (HCC)   . Sleep apnea with use of continuous positive airway pressure (CPAP)      Family History  Problem Relation Age of Onset  . Diabetes Father   . Heart disease Father   . Breast cancer Sister      Social History   Social History  . Marital status: Single    Spouse name: N/A  . Number of children: N/A  . Years of education: N/A   Occupational History  . disabled    Social History Main Topics  . Smoking status: Never Smoker  . Smokeless tobacco: Never Used  . Alcohol use No  . Drug use: No  . Sexual activity: Not on file   Other Topics Concern  . Not  on file   Social History Narrative   LIves in group home      Allergies  Allergen Reactions  . Cephalexin Other (See Comments)    Seizures      Outpatient Medications Prior to Visit  Medication Sig Dispense Refill  . citalopram (CELEXA) 20 MG tablet GIVE 1 TABLET VIA G TUBE EVERY DAY    . cyanocobalamin (TH VITAMIN B12) 100 MCG tablet Take 100 mcg by mouth every morning.     . famotidine (PEPCID) 40 MG/5ML suspension Take 20 mg  by mouth daily    . folic acid (FOLVITE) 1 MG tablet Take 1 mg  by mouth  daily    . ipratropium-albuterol (DUONEB) 0.5-2.5 (3) MG/3ML SOLN Take 1 ampule by nebulization 2 (two) times daily.    Marland Kitchen levETIRAcetam (KEPPRA) 500 MG tablet Take 3 tablets (1,500 mg total) by mouth 2 (two) times daily. 180 tablet 0  . levothyroxine (SYNTHROID, LEVOTHROID) 150 MCG tablet Take one tablet (150 mcg total)  by mouth  daily    . Melatonin 1 MG TABS Take 1 mg by mouth at bedtime.     .  OXYGEN Inhale 2 L into the lungs as directed. With CPAP    . phenytoin (DILANTIN) 100 MG ER capsule Take 1 capsule (100 mg total) by mouth at bedtime. 7 capsule 0  . risperiDONE (RISPERDAL) 1 MG tablet Take 1 mg by mouth every morning and 2 mg at night    . senna (SENOKOT) 176 MG/5ML SYRP as directed. As needed for stomach    . simvastatin (ZOCOR) 40 MG tablet Take 40 mg  by mouth  at bedtime    . phenytoin (DILANTIN) 200 MG ER capsule Take 200 mg  by mouth at dinner time     No facility-administered medications prior to visit.       Review of Systems  Constitutional: Negative for fever and unexpected weight change.  HENT: Negative for congestion, dental problem, ear pain, nosebleeds, postnasal drip, rhinorrhea, sinus pressure, sneezing, sore throat and trouble swallowing.   Eyes: Negative for redness and itching.  Respiratory: Positive for cough and shortness of breath. Negative for chest tightness and wheezing.   Cardiovascular: Negative for palpitations and leg swelling.  Gastrointestinal: Negative for nausea and vomiting.  Genitourinary: Negative for dysuria.  Musculoskeletal: Negative for joint swelling.  Skin: Negative for rash.  Neurological: Negative for headaches.  Hematological: Does not bruise/bleed easily.  Psychiatric/Behavioral: Negative for dysphoric mood. The patient is not nervous/anxious.        Objective:   Physical Exam Vitals:   02/11/17 1058  BP: 120/60  Pulse: (!) 56  SpO2: 96%  Weight: 158 lb (71.7 kg)  Height: 4\' 10"  (1.473 m)    Gen: obese, changes consistent with Downs syndrome, no acute distress HENT: NCAT, OP clear with poor dentition, thick neck Eyes: PERRL, EOMi Lymph: no cervical lymphadenopathy PULM: Crackles bases bilaterally CV: RRR, no mgr, no JVD GI: BS+, soft, nontender, no hsm Derm: no rash or skin breakdown MSK: normal bulk and tone Neuro: A&Ox4, CN II-XII intact, strength 5/5 in all 4 extremities Psyche: normal mood and  affect    CBC    Component Value Date/Time   WBC 5.4 05/11/2016 1920   RBC 2.96 (L) 05/11/2016 1920   HGB 10.1 (L) 05/11/2016 1920   HCT 32.3 (L) 05/11/2016 1920   PLT 128 (L) 05/11/2016 1920   MCV 109.1 (H) 05/11/2016 1920   MCH 34.1 (H) 05/11/2016 1920  MCHC 31.3 05/11/2016 1920   RDW 17.2 (H) 05/11/2016 1920   LYMPHSABS 1.1 05/11/2016 1920   MONOABS 0.5 05/11/2016 1920   EOSABS 0.1 05/11/2016 1920   BASOSABS 0.1 05/11/2016 1920    Records from her physicians in [facility] reviewed, they note that she has a past medical history significant for Down syndrome, pulmonary embolism, PEG tube placement, hypothyroidism and Dilantin use for seizure disorder.       Assessment & Plan:  Aspiration pneumonia, unspecified aspiration pneumonia type, unspecified laterality, unspecified part of lung (HCC) - Plan: DG Chest 2 View  OSA (obstructive sleep apnea) - Plan: Ambulatory Referral for DME, Pulse oximetry, overnight  Dysphagia, unspecified type - Plan: Ambulatory referral to Speech Therapy  Cough   Discussion: [name] is here to establish care with me today for her dysphasia leading to aspiration pneumonia, obstructive sleep apnea with nocturnal hypoxemia, and asthma all in the setting of Down syndrome. From what I can tell she has been very well cared for over the years. I think that the current treatment plan her family has at home clearly is working quite well she is able to ambulate and she has not been hospitalized recently for pneumonia.  In regards to her pulmonary embolism this was a provoked one time event and so she no longer needs anticoagulation.  In regards to her dry cough, I think there is a high likelihood of GERD.  She likely has some degree of scarring in her lungs secondary to chronic aspiration. To try to mitigate this risk and going to get her back into speech therapy to help her with her swallowing technique and appropriate food texture.  We  will check an overnight oximetry test.  For your recurrent aspiration pneumonia: We will check a chest x-ray today We will refer you to speech therapy If you develop fever, chills, lethargy, shortness of breath, or cough with mucus production then let me know right away because this could be pneumonia  For your obstructive sleep apnea: Keep using BiPAP with oxygen at night We will check her oxygen level while you are sleeping  For your asthma: Keep using albuterol on an as-needed basis for shortness of breath, I think the way you're doing it now is fine  We will see you back in 4 months or sooner if needed  Current Outpatient Prescriptions:  .  citalopram (CELEXA) 20 MG tablet, GIVE 1 TABLET VIA G TUBE EVERY DAY, Disp: , Rfl:  .  cyanocobalamin (TH VITAMIN B12) 100 MCG tablet, Take 100 mcg by mouth every morning. , Disp: , Rfl:  .  famotidine (PEPCID) 40 MG/5ML suspension, Take 20 mg  by mouth daily, Disp: , Rfl:  .  folic acid (FOLVITE) 1 MG tablet, Take 1 mg  by mouth  daily, Disp: , Rfl:  .  ipratropium-albuterol (DUONEB) 0.5-2.5 (3) MG/3ML SOLN, Take 1 ampule by nebulization 2 (two) times daily., Disp: , Rfl:  .  levETIRAcetam (KEPPRA) 500 MG tablet, Take 3 tablets (1,500 mg total) by mouth 2 (two) times daily., Disp: 180 tablet, Rfl: 0 .  levothyroxine (SYNTHROID, LEVOTHROID) 150 MCG tablet, Take one tablet (150 mcg total)  by mouth  daily, Disp: , Rfl:  .  Melatonin 1 MG TABS, Take 1 mg by mouth at bedtime. , Disp: , Rfl:  .  OXYGEN, Inhale 2 L into the lungs as directed. With CPAP, Disp: , Rfl:  .  phenytoin (DILANTIN) 100 MG ER capsule, Take 1 capsule (100 mg total)  by mouth at bedtime., Disp: 7 capsule, Rfl: 0 .  risperiDONE (RISPERDAL) 1 MG tablet, Take 1 mg by mouth every morning and 2 mg at night, Disp: , Rfl:  .  senna (SENOKOT) 176 MG/5ML SYRP, as directed. As needed for stomach, Disp: , Rfl:  .  simvastatin (ZOCOR) 40 MG tablet, Take 40 mg  by mouth  at bedtime, Disp: ,  Rfl:

## 2017-02-12 NOTE — Telephone Encounter (Signed)
[facility].  [employer] has been providing pt's O2 since October & they will be responsible for her.  I will send ONO order to [employer].  The issue is that [employer] is not in network with Medicare & they did not realize this was the pt's primary insurance.  They will need to work something out.  Nothing further needed at this time.

## 2017-02-12 NOTE — Addendum Note (Signed)
Addended by: Sheran LuzEAST, Najee Manninen K on: 02/12/2017 10:35 AM   Modules accepted: Orders

## 2017-02-12 NOTE — Telephone Encounter (Signed)
Can someone ask another company to do the ONO?  I've had lots of people get an ONO on BIPAP and O2 before.  It would be overkill to send this girl to the sleep lab for this.

## 2017-02-17 ENCOUNTER — Encounter: Payer: Self-pay | Admitting: Neurology

## 2017-02-17 ENCOUNTER — Ambulatory Visit (INDEPENDENT_AMBULATORY_CARE_PROVIDER_SITE_OTHER): Payer: Medicare Other | Admitting: Neurology

## 2017-02-17 VITALS — BP 123/58 | HR 58 | Ht <= 58 in | Wt 154.5 lb

## 2017-02-17 DIAGNOSIS — G40909 Epilepsy, unspecified, not intractable, without status epilepticus: Secondary | ICD-10-CM

## 2017-02-17 DIAGNOSIS — Q909 Down syndrome, unspecified: Secondary | ICD-10-CM | POA: Diagnosis not present

## 2017-02-17 NOTE — Progress Notes (Signed)
PATIENT: Kaitlyn Powell DOB: 04-11-1956  Chief Complaint  Patient presents with  . Seizures    She is here with her sister/primary caregiver, Kaitlyn Powell. She would like to discuss her EEG results and medication regimen.  . Down Syndrome     HISTORICAL  Kaitlyn Powell is a 61 years old left-handed female, accompanied by her sister Kaitlyn Powell, who is also her power of attorney, seen in refer by her primary care physician  Kaitlyn Powell, Kaitlyn Powell, to evaluate for seizure, initial evaluation was on January 11 2017.  I reviewed and summarized the referring and most recent hospital [facility], she had a history of Down syndrome, asthma, obstructive sleep apnea, on CPAP at night, hypothyroidism, seizure, sick sinus syndrome status post pacemaker, she was [facility] on March 12 2016 with dyspnea and wheezing, was noted to have low-grade fever 37.6, oxygen saturation was 82%, chest x-ray showed Ldisease, she was treated with Zosyn and Vancomyocin, CT angiograms demonstrate small feeding defect in left lower lobe pulmonary artery, suspicious for PE, she was treated with IV heparin, was also seen for possible congestive heart failure, required ICU admission, daytime BiPAP, she had prolonged ICU stay in for 16 days, she was able to eventually wean off BiPAP, modified swallowing test confirmed swallowing dysfunction with silent aspiration, she was put on NG tube, later switched to Coumadin due to PE, but INR was difficult to stabilize in the therapeutic window,   Her acute hypoxic respiratory failure was thought due to the combination of pulmonary emboli, obstructive sleep apnea, congestive heart failure exacerbation and pneumonia, she was also treated with a short course of Solu-Medrol, echocardiogram August 11 showed ejection fraction of 60-65%, pulmonary hypertension likely secondary to  diastolic heart failure, possible contribution from PE and Powell space disease,  She was seen by hematologist, who suggested 3 months course of anticoagulation with Lovenox, she had prolonged rehabilitation since April 2017, regained significant recovery, she also noted to have thrombocytopenia, had PEG tube placement, this was placed on April 17 2016 without complication, postprocedure she had mild to moderate generalized abdominal pain, extensive evaluations, including normal or negative UA, hepatic panel, abdominal x-ray, she was able to tolerate the tube feeding well, next  Laboratory evaluations in September 2017, WBC 5.9, hemoglobin of 9.0, creatinine of 0.88,  She also had a lifelong history of seizure, previously was treated with Dilantin, macrocytic anemia with normal B12 level, considered due to prolonged use of Dilantin, there was a concern of interaction between Dilantin and other long-term by mouth anticoagulation treatment such as Coumadin and Kerin SalenXalreto, she was discharged with Lovenox subcutaneous injection, PEG tube was removed in Nov 2017, she is on a mechanical soft diet, thickened liquid, she began to progress to small bite, able to take by mouth's by mouth  Most recent laboratory evaluation in May 2018, hemoglobin 10 point 7, normal CMP, creatinine of 0.96, normal liver functional test, slight elevated d-dimer 1.01  She is currently taking Dilantin 200 mg twice a day, she lives with her sister's family, she has stopped the Lovenox since November 2017,  She suffered seizure all her life, drop seizures, she is now having on daily basis, she had sudden onset of lost muscle tone, loss consciousness, lasting less than 1 minute,   UPDATE February 17 2017: She is accompanied by her sister at today's clinical visit, following last visit on January 11 2017, in attempt to change her antiepileptic medications tapering off the Dilantin, titrating up Keppra, she suffered significant  complications,  While taking Keppra 500 mg twice a day, decrease Dilantin to 200 mg every night, she suffered generalized tonic-clonic seizure, with total of 6 generalized tonic-clonic seizure since, the last one was on January 28 2017, Dilantin 200 mg twice a day was reintroduced, she is now on lower dose of Keppra 500 mg twice a day,  She still drowsy, especially after morning dose of medication, she no longer has recurrent generalized tonic-clonic seizure, only had one drop attack with current combinations,  Before that last generalized seizure was in October 2017, she was also noted to be emotional.   We have personally reviewed MRI of the brain without contrast in July 2018: Congenital microcephaly consistent with her history of Down syndrome, moderate parenchymal brain volume loss, moderate small vessel disease  EEG showed moderate to severe background slowing, rare generalized spike slow waves,  REVIEW OF SYSTEMS: Full 14 system review of systems performed and notable only for shortness of breath, cough, hearing loss, trouble swallowing, seizure, passing out, allergies, runny nose  ALLERGIES: Allergies  Allergen Reactions  . Cephalexin Other (See Comments)    Seizures     HOME MEDICATIONS: Current Outpatient Prescriptions  Medication Sig Dispense Refill  . citalopram (CELEXA) 20 MG tablet GIVE 1 TABLET VIA G TUBE EVERY DAY    . cyanocobalamin (TH VITAMIN B12) 100 MCG tablet Take 100 mcg by mouth every morning.     . famotidine (PEPCID) 40 MG/5ML suspension Take 20 mg  by mouth daily    . folic acid (FOLVITE) 1 MG tablet Take 1 mg  by mouth  daily    . ipratropium-albuterol (DUONEB) 0.5-2.5 (3) MG/3ML SOLN Take 1 ampule by nebulization 2 (two) times daily.    Marland Kitchen levETIRAcetam (KEPPRA) 500 MG tablet Take 3 tablets (1,500 mg total) by mouth 2 (two) times daily. 180 tablet 0  . levothyroxine (SYNTHROID, LEVOTHROID) 150 MCG tablet Take one tablet (150 mcg total)  by mouth  daily    .  Melatonin 1 MG TABS Take 1 mg by mouth at bedtime.     . OXYGEN Inhale 2 Powell into the lungs as directed. With CPAP    . phenytoin (DILANTIN) 100 MG ER capsule Take 1 capsule (100 mg total) by mouth at bedtime. 7 capsule 0  . risperiDONE (RISPERDAL) 1 MG tablet Take 1 mg by mouth every morning and 2 mg at night    . senna (SENOKOT) 176 MG/5ML SYRP as directed. As needed for stomach    . simvastatin (ZOCOR) 40 MG tablet Take 40 mg  by mouth  at bedtime     No current facility-administered medications for this visit.     PAST MEDICAL HISTORY: Past Medical History:  Diagnosis Date  . Arthritis   . Aspiration pneumonia (HCC)   . Asthma   . Cardiac arrhythmia    have pacemeaker  . Down syndrome   . GERD (gastroesophageal reflux disease)   . Heart block   . Hypothyroidism   . Mental retardation   . Pulmonary emboli (HCC)   . Seizures (HCC)   . Sleep apnea with use of continuous positive airway pressure (CPAP)     PAST SURGICAL HISTORY: Past Surgical History:  Procedure Laterality Date  . PACEMAKER INSERTION  12/09/2015   [employer] Accolade MRI EL PPM implanted in PA for complete heart block and syncope  . PEG  TUBE PLACEMENT    . TIBIA FRACTURE SURGERY Right     FAMILY HISTORY: Family History  Problem Relation Age of Onset  . Diabetes Father   . Heart disease Father   . Breast cancer Sister     SOCIAL HISTORY:  Social History   Social History  . Marital status: Single    Spouse name: N/A  . Number of children: N/A  . Years of education: N/A   Occupational History  . disabled    Social History Main Topics  . Smoking status: Never Smoker  . Smokeless tobacco: Never Used  . Alcohol use No  . Drug use: No  . Sexual activity: Not on file   Other Topics Concern  . Not on file   Social History Narrative   LIves in group home      PHYSICAL EXAM   Vitals:   02/17/17 1527  BP: (!) 123/58  Pulse: (!) 58  Weight: 154 lb 8 oz (70.1 kg)  Height: 4\' 10"   (1.473 m)    Not recorded      Body mass index is 32.29 kg/m.  PHYSICAL EXAMNIATION:  Gen: NAD, conversant, well nourised, obese, well groomed                     Cardiovascular: Regular rate rhythm, no peripheral edema, warm, nontender. Eyes: Conjunctivae clear without exudates or hemorrhage Neck: Supple, no carotid bruits. Pulmonary: Clear to auscultation bilaterally   NEUROLOGICAL EXAM:  MENTAL STATUS: Speech/Cognition: Slurred speech, facial feature consistent with Down syndrome, follow commands and exam CRANIAL NERVES: CN II: Visual fields are full to confrontation. Fundoscopic exam is normal with sharp discs and no vascular changes. Pupils are round equal and briskly reactive to light. CN III, IV, VI: extraocular movement are normal. No ptosis. CN V: Facial sensation is intact to pinprick in all 3 divisions bilaterally. Corneal responses are intact.  CN VII: Face is symmetric with normal eye closure and smile. CN VIII: Hearing is normal to rubbing fingers CN IX, X: Palate elevates symmetrically. Phonation is normal. CN XI: Head turning and shoulder shrug are intact CN XII: Tongue is midline with normal movements and no atrophy.  MOTOR: There is no pronator drift of out-stretched arms. Muscle bulk and tone are normal. Muscle strength is normal.  REFLEXES: Reflexes are 2+ and symmetric at the biceps, triceps, knees, and ankles. Plantar responses are flexor.  SENSORY: Intact to light touch, pinprick, positional sensation and vibratory sensation are intact in fingers and toes.  COORDINATION: Rapid alternating movements and fine finger movements are intact. There is no dysmetria on finger-to-nose and heel-knee-shin.    GAIT/STANCE: Mildly unsteady   DIAGNOSTIC DATA (LABS, IMAGING, TESTING) - I reviewed patient records, labs, notes, testing and imaging myself where available.   ASSESSMENT AND PLAN  Kaitlyn Powell is a 61 y.o. female   Down  syndrome Epilepsy  Continue Keppra 500mg  bid  Dilantin 200mg  bid  Return to clinic in 4-6 weeks, if she remains to have early morning fatigue, emotional outburst, may consider lower dose of keppra.   Kaitlyn Powell, M.D. Ph.D.  [facility] [address], [address] [place], [place] [address] Ph: [phone] Fax: [phone]  CC:Kaitlyn Ora, MD

## 2017-02-18 ENCOUNTER — Telehealth: Payer: Self-pay | Admitting: Neurology

## 2017-02-18 ENCOUNTER — Encounter: Payer: Self-pay | Admitting: Pulmonary Disease

## 2017-02-18 NOTE — Telephone Encounter (Signed)
Pt sister [name](on DPR) has additional questions re: care of pt and would like a call back please

## 2017-02-18 NOTE — Telephone Encounter (Signed)
[name] needed to get the fax number to medical records.  Provided her with the information.

## 2017-02-24 ENCOUNTER — Telehealth: Payer: Self-pay | Admitting: Pulmonary Disease

## 2017-02-24 DIAGNOSIS — G4733 Obstructive sleep apnea (adult) (pediatric): Secondary | ICD-10-CM

## 2017-02-24 NOTE — Telephone Encounter (Signed)
lmom tcb x1 to E[facility]ison...we are not pt's pcp office

## 2017-02-24 NOTE — Telephone Encounter (Signed)
[name] to call me back regarding order for cpap supplies.

## 2017-02-24 NOTE — Telephone Encounter (Signed)
Left a message for Kaitlyn Powell to call back.

## 2017-02-25 NOTE — Telephone Encounter (Signed)
OK by me 

## 2017-02-25 NOTE — Telephone Encounter (Signed)
Left detailed VM on Allison's named VMbox giving VO as requested.  Nothing further needed.

## 2017-02-25 NOTE — Telephone Encounter (Signed)
Revonda StandardAllison returned phone call, Dr. Kendrick FriesMcquaid is the referring physician for speech therapy.Marland Kitchen.Marland Kitchen.Allison contact # [phone].Marland Kitchen.ert

## 2017-02-25 NOTE — Telephone Encounter (Signed)
BQ  Please Advise-  Revonda Standardllison [facility] requesting [facility] verbal order for pt to receive speech once a week for three weeks and an order for evaluation for physical therapy at home.    FYI to nurse: Revonda Standardllison states due to her being in and out of patients home, she states if we get her voicemail it is confidential and we can leave the orders on her voicemail.

## 2017-02-25 NOTE — Telephone Encounter (Signed)
lmomtcb x1 

## 2017-02-25 NOTE — Telephone Encounter (Signed)
LMOM TCB x2 for Verlon Au[facility]lie with [facility]

## 2017-02-25 NOTE — Telephone Encounter (Signed)
[name] called me back & left vm.  She states pt & her sister came by the office on 7/23 & picked up supplies.  I called the sister & she states this is correct.  He call yesterday was in regards to the oxygen concentrator they need.  Can no longer use [facility] for O2 due to they don't file Medicare. I told her I would send this message to triage nurse & let them know order needed for O2  I spoke to Security[facility]lie [facility] yesterday & she was going to have a triage message put in.  I see that message has been sent to triage for O2 so I will close this message.  Nothing further needed for cpap supplies

## 2017-02-26 NOTE — Telephone Encounter (Signed)
Kaitlyn Powell returning call - she can be reached at [phone] -pr

## 2017-02-26 NOTE — Telephone Encounter (Signed)
BQ ok to order cpap titration [facility]?  Thanks

## 2017-02-26 NOTE — Telephone Encounter (Signed)
Spoke with Verlon [facility]lie at [facility]. She stated that the patient will be need to be sent to a different DME for her o2 due to her Medicare. She stated that she had already talked to [place] about this? Will route to [place]herry.

## 2017-02-26 NOTE — Telephone Encounter (Signed)
[facility] [place] [facility].  Since pt is on bi-pap & needs O2 she will have to have a cpap titration study.  An order needs to be placed.

## 2017-02-26 NOTE — Telephone Encounter (Signed)
I have left vm for [name] to [facility] me[facility]  They have pt's speech therapy & bi-pap.  Will see if they can take her for O2 before trying another dme & pulling the bi-pap.  [facility].

## 2017-03-01 NOTE — Telephone Encounter (Signed)
The cpap titration has been ordered per BQ>  Nothing further is needed.

## 2017-03-01 NOTE — Telephone Encounter (Signed)
Ok by me

## 2017-03-03 ENCOUNTER — Ambulatory Visit (HOSPITAL_BASED_OUTPATIENT_CLINIC_OR_DEPARTMENT_OTHER): Payer: Medicare Other | Attending: Pulmonary Disease | Admitting: Pulmonary Disease

## 2017-03-03 ENCOUNTER — Encounter: Payer: Self-pay | Admitting: Pulmonary Disease

## 2017-03-03 DIAGNOSIS — Q909 Down syndrome, unspecified: Secondary | ICD-10-CM | POA: Insufficient documentation

## 2017-03-03 DIAGNOSIS — G4761 Periodic limb movement disorder: Secondary | ICD-10-CM | POA: Diagnosis not present

## 2017-03-03 DIAGNOSIS — Z950 Presence of cardiac pacemaker: Secondary | ICD-10-CM | POA: Insufficient documentation

## 2017-03-03 DIAGNOSIS — G4733 Obstructive sleep apnea (adult) (pediatric): Secondary | ICD-10-CM | POA: Insufficient documentation

## 2017-03-05 ENCOUNTER — Telehealth: Payer: Self-pay

## 2017-03-05 ENCOUNTER — Institutional Professional Consult (permissible substitution): Payer: Medicare Other | Admitting: Pulmonary Disease

## 2017-03-05 ENCOUNTER — Encounter: Payer: Self-pay | Admitting: Pulmonary Disease

## 2017-03-05 NOTE — Procedures (Signed)
Patient Name: Kaitlyn Powell, Kaitlyn Powell Study Date: 03/03/2017 Gender: Female D.O.B: 03-21-1956 Age (years): 60 Referring Provider: Max Fickleouglas McQuaid Height (inches): 58 Interpreting Physician: Cyril Mourningakesh Wylodean Shimmel MD, ABSM Weight (lbs): 160 RPSGT: Lowry RamMckinney, Takeya BMI: 33 MRN: [id number] Neck Size: 16.50    CLINICAL INFORMATION The patient is referred for a CPAP titration to treat sleep apnea.h/o Downs syndrome , with previous diagnosis of  OSA maintained on biPAP ?Titration study 09/2015 : BiPAP titrated to 19/15 This study was per formed to assess need for supplemental O2  SLEEP STUDY TECHNIQUE As per the AASM Manual for the Scoring of Sleep and Associated Events v2.3 (April 2016) with a hypopnea requiring 4% desaturations.  The channels recorded and monitored were frontal, central and occipital EEG, electrooculogram (EOG), submentalis EMG (chin), nasal and oral airflow, thoracic and abdominal wall motion, anterior tibialis EMG, snore microphone, electrocardiogram, and pulse oximetry. Continuous positive airway pressure (CPAP) was initiated at the beginning of the study and titrated to treat sleep-disordered breathing.  RESPIRATORY PARAMETERS Optimal PAP Pressure (cm): 14 AHI at Optimal Pressure (/hr): 9.6 Overall Minimal O2 (%): 76.00 Supine % at Optimal Pressure (%): 45 Minimal O2 at Optimal Pressure (%): 85.0     SLEEP ARCHITECTURE The study was initiated at 9:15:23 PM and ended at 4:55:00 AM.  Sleep onset time was 0.3 minutes and the sleep efficiency was 98.4%. The total sleep time was 452.5 minutes.  The patient spent 0.77% of the night in stage N1 sleep, 90.50% in stage N2 sleep, 0.00% in stage N3 and 8.73% in REM.Stage REM latency was 144.5 minutes  Wake after sleep onset was 6.8. Alpha intrusion was absent. Supine sleep was 86.96%.  CARDIAC DATA The 2 lead EKG demonstrated sinus rhythm, pacemaker generated. The mean heart rate was 50.23 beats per minute. Other EKG findings include:  None. LEG MOVEMENT DATA The total Periodic Limb Movements of Sleep (PLMS) were 10. The PLMS index was 1.33. A PLMS index of <15 is considered normal in adults.  IMPRESSIONS -  PAP pressure was titrated to 14 cm of water. Titration was sub optimal since few residual events were noted at this level - Central sleep apnea was not noted during this titration (CAI = 0.0/h). - Severe oxygen desaturations were observed during this titration (min O2 = 76.00%). - The patient snored with Soft snoring volume during this titration study. - No cardiac abnormalities were observed during this study. - Clinically significant periodic limb movements were not noted during this study. Arousals associated with PLMs were rare.   DIAGNOSIS - Obstructive Sleep Apnea (327.23 [G47.33 ICD-10])   RECOMMENDATIONS - CPAP was titrated to 14 cm H2O with a Small size Resmed Full Face Mask AirFit F20 mask and heated humidification. I note that on a prior titration on 09/2015, events were eliminated with bilevel 19/15 cm. That may be the more optimal level. No supplemental O2 was required during either study - Avoid alcohol, sedatives and other CNS depressants that may worsen sleep apnea and disrupt normal sleep architecture. - Sleep hygiene should be reviewed to assess factors that may improve sleep quality. - Weight management and regular exercise should be initiated or continued. - Return to [facility] Center for re-evaluation after 4 weeks of therapy   Cyril Mourningakesh Sephora Boyar MD Board Certified in Sleep medicine

## 2017-03-05 NOTE — Telephone Encounter (Signed)
A,  Please let her know that her oxygen level was low on BIPAP with Oxygen. I want them to increase her O2 flow to 3LPM with BIPAP.  Thanks,  B  -------  Spoke with pt's sister and POA [name], who is requesting to further discuss these changes to her machine with a sleep specialist- pt owns bipap and wishes to stay on Bipap, and would like a further explanation to her sleep study than I am willing to offer.  Pt scheduled to see RA on Monday. Will close encounter.

## 2017-03-05 NOTE — Telephone Encounter (Signed)
-----   Message from Lupita Leashouglas B McQuaid, MD sent at 03/05/2017  1:31 PM EDT ----- A, Her CPAP titration showed that she should be on 14cm H20.  Let's try this, but she needs 3L O2 bleed in.  Needs ONO.  If her sister notes any change in symptoms then let us know right away and we can try BIPAP again. Kipp BroodBrent

## 2017-03-08 ENCOUNTER — Telehealth: Payer: Self-pay | Admitting: Pulmonary Disease

## 2017-03-08 ENCOUNTER — Ambulatory Visit: Payer: 59 | Admitting: Pulmonary Disease

## 2017-03-08 DIAGNOSIS — G4733 Obstructive sleep apnea (adult) (pediatric): Secondary | ICD-10-CM

## 2017-03-08 NOTE — Telephone Encounter (Signed)
Libby the order has been placed. Thanks

## 2017-03-08 NOTE — Telephone Encounter (Signed)
Kaitlyn PenSherry,  Do you know anything about placing [facility] the portable concentrator that they are needing since they have to turn the one in to [facility] due to insurance.

## 2017-03-08 NOTE — Telephone Encounter (Signed)
Spoke to ToysRusmary she said pt has changed to regular medicare and [facility] does not except it so pt needs order to [facility] and will need ordered placed thanks libby

## 2017-03-08 NOTE — Telephone Encounter (Signed)
Order was sent to [facility] to change [facility] from [facility] Kaitlyn Powell

## 2017-03-09 ENCOUNTER — Encounter: Payer: Self-pay | Admitting: *Deleted

## 2017-03-09 ENCOUNTER — Telehealth: Payer: Self-pay | Admitting: Pulmonary Disease

## 2017-03-09 NOTE — Telephone Encounter (Signed)
Order was put in yesterday afternoon for [facility]  Nothing further needed.

## 2017-03-09 NOTE — Telephone Encounter (Signed)
Called and spoke with [name] and she stated that at this point they are going to wait and see if they can find one that the pt may purchase without going through a company.  Nothing further is needed.

## 2017-03-11 ENCOUNTER — Telehealth: Payer: Self-pay | Admitting: Pulmonary Disease

## 2017-03-11 NOTE — Telephone Encounter (Signed)
Patient sister [name] calling back regarding return phone call - she can be reached at [phone] -pr

## 2017-03-11 NOTE — Telephone Encounter (Signed)
Lupita LeashMcQuaid, Douglas B, MD  Velvet Batheaulfield, Ashley L, CMA        Hi,  This showed that her O2 sat on BIPAP with 2L was less than 88% for over an hour. She needs to use 3L with BIPAP. I think we may have already addressed this. There was an office visit pending.  Kipp BroodBrent   --------------------------------------------- Spoke with pt's daughter, [name]. She states that she is concerned with the increased. Reports that at night her and her husband check the pt's oxygen level on 2L and she's always at 97-98%. [name] is concerned with increasing her to 3L when it seems that 2L is sufficient enough per their pulse ox.  BQ - please advise. Thanks.

## 2017-03-18 ENCOUNTER — Ambulatory Visit (INDEPENDENT_AMBULATORY_CARE_PROVIDER_SITE_OTHER): Payer: Medicare Other | Admitting: Neurology

## 2017-03-18 ENCOUNTER — Encounter: Payer: Self-pay | Admitting: Neurology

## 2017-03-18 VITALS — BP 130/64 | HR 53 | Resp 18 | Ht <= 58 in | Wt 157.0 lb

## 2017-03-18 DIAGNOSIS — G40909 Epilepsy, unspecified, not intractable, without status epilepticus: Secondary | ICD-10-CM | POA: Diagnosis not present

## 2017-03-18 DIAGNOSIS — Q909 Down syndrome, unspecified: Secondary | ICD-10-CM | POA: Diagnosis not present

## 2017-03-18 MED ORDER — PHENYTOIN SODIUM EXTENDED 200 MG PO CAPS: 200.0000 mg | ORAL_CAPSULE | Freq: Every day | ORAL | 4 refills | Status: DC

## 2017-03-18 MED ORDER — LEVETIRACETAM ER 750 MG PO TB24: 1500.0000 mg | ORAL_TABLET | Freq: Every day | ORAL | 11 refills | Status: DC

## 2017-03-18 NOTE — Progress Notes (Signed)
PATIENT: Kaitlyn Powell DOB: [birth year]  Chief Complaint  Patient presents with  . Seizures    4 sz. since last ov, all while straining with bowel movement.  8/5, 8-7, 8-8, 8-13.       HISTORICAL  MELLANY DINSMORE is a 61 years old left-handed female, accompanied by her sister [name], who is also her power of attorney, seen in refer by her primary care physician  [name], to evaluate for seizure, initial evaluation was on January 11 2017.  I reviewed and summarized the referring and most recent hospital discharge from [facility] in September 2017, she had a history of Down syndrome, asthma, obstructive sleep apnea, on CPAP at night, hypothyroidism, seizure, sick sinus syndrome status post pacemaker, she was admitted to the hospital at [facility] on March 12 2016 with dyspnea and wheezing, was noted to have low-grade fever 37.6, oxygen saturation was 82%, chest x-ray showed Ldisease, she was treated with Zosyn and Vancomyocin, CT angiograms demonstrate small feeding defect in left lower lobe pulmonary artery, suspicious for PE, she was treated with IV heparin, was also seen for possible congestive heart failure, required ICU admission, daytime BiPAP, she had prolonged ICU stay in for 16 days, she was able to eventually wean off BiPAP, modified swallowing test confirmed swallowing dysfunction with silent aspiration, she was put on NG tube, later switched to Coumadin due to PE, but INR was difficult to stabilize in the therapeutic window,   Her acute hypoxic respiratory failure was thought due to the combination of pulmonary emboli, obstructive sleep apnea, congestive heart failure exacerbation and pneumonia, she was also treated with a short course of Solu-Medrol, echocardiogram August 11 showed ejection fraction of 60-65%, pulmonary hypertension likely secondary to diastolic heart failure, possible contribution from PE and L  space disease,  She was seen by hematologist, who suggested 3 months course of anticoagulation with Lovenox, she had prolonged rehabilitation since April 2017, regained significant recovery, she also noted to have thrombocytopenia, had PEG tube placement, this was placed on April 17 2016 without complication, postprocedure she had mild to moderate generalized abdominal pain, extensive evaluations, including normal or negative UA, hepatic panel, abdominal x-ray, she was able to tolerate the tube feeding well, next  Laboratory evaluations in September 2017, WBC 5.9, hemoglobin of 9.0, creatinine of 0.88,  She also had a lifelong history of seizure, previously was treated with Dilantin, macrocytic anemia with normal B12 level, considered due to prolonged use of Dilantin, there was a concern of interaction between Dilantin and other long-term by mouth anticoagulation treatment such as Coumadin and [name], she was discharged with Lovenox subcutaneous injection, PEG tube was removed in Nov 2017, she is on a mechanical soft diet, thickened liquid, she began to progress to small bite, able to take by mouth's by mouth  Most recent laboratory evaluation in May 2018, hemoglobin 10 point 7, normal CMP, creatinine of 0.96, normal liver functional test, slight elevated d-dimer 1.01  She is currently taking Dilantin 200 mg twice a day, she lives with her sister's family, she has stopped the Lovenox since November 2017,  She suffered seizure all her life, drop seizures, she is now having on daily basis, she had sudden onset of lost muscle tone, loss consciousness, lasting less than 1 minute,   UPDATE February 17 2017: She is accompanied by her sister at today's clinical visit, following last visit on January 11 2017, in attempt to change  her antiepileptic medications tapering off the Dilantin, titrating up Keppra, she suffered significant complications,  While taking Keppra 500 mg twice a day, decrease Dilantin to  200 mg every night, she suffered generalized tonic-clonic seizure, with total of 6 generalized tonic-clonic seizure since, the last one was on January 28 2017, Dilantin 200 mg twice a day was reintroduced, she is now on lower dose of Keppra 500 mg twice a day,  She still drowsy, especially after morning dose of medication, she no longer has recurrent generalized tonic-clonic seizure, only had one drop attack with current combinations,  Before that last generalized seizure was in October 2017, she was also noted to be emotional.   We have personally reviewed MRI of the brain without contrast in July 2018: Congenital microcephaly consistent with her history of Down syndrome, moderate parenchymal brain volume loss, moderate small vessel disease  EEG showed moderate to severe background slowing, rare generalized spike slow waves,  UPDATE March 18 2017: she is now taking Dilantin ER 200 mg 1 tablet twice a day, and  keppra 500 mg twice a day  sister Reported drop attacks, sudden loss of muscle tone, lasting for a few second, no body jerking movement, but only happened while she was sitting on the toilet straining, she had 4 spells over past 2 weeks,  Her sister reported that adding Keppra has decreased the frequency of those spells, but I am not sure those could represent vasovagal syncope versus seizures  REVIEW OF SYSTEMS: Full 14 system review of systems performed and notable only for headache, seizure, hearing loss, eye itching,  ALLERGIES: Allergies  Allergen Reactions  . Cephalexin Other (See Comments)    Seizures     HOME MEDICATIONS: Current Outpatient Prescriptions  Medication Sig Dispense Refill  . citalopram (CELEXA) 20 MG tablet GIVE 1 TABLET VIA G TUBE EVERY DAY    . cyanocobalamin (TH VITAMIN B12) 100 MCG tablet Take 100 mcg by mouth every morning.     . famotidine (PEPCID) 40 MG/5ML suspension Take 20 mg  by mouth daily    . folic acid (FOLVITE) 1 MG tablet Take 1 mg  by mouth   daily    . ipratropium-albuterol (DUONEB) 0.5-2.5 (3) MG/3ML SOLN Take 1 ampule by nebulization 2 (two) times daily.    Marland Kitchen. levETIRAcetam (KEPPRA) 500 MG tablet Take 3 tablets (1,500 mg total) by mouth 2 (two) times daily. 180 tablet 0  . levothyroxine (SYNTHROID, LEVOTHROID) 150 MCG tablet Take one tablet (150 mcg total)  by mouth  daily    . Melatonin 1 MG TABS Take 1 mg by mouth at bedtime.     . OXYGEN Inhale 2 L into the lungs as directed. With CPAP    . phenytoin (DILANTIN) 100 MG ER capsule Take 1 capsule (100 mg total) by mouth at bedtime. 7 capsule 0  . risperiDONE (RISPERDAL) 1 MG tablet Take 1 mg by mouth every morning and 2 mg at night    . senna (SENOKOT) 176 MG/5ML SYRP as directed. As needed for stomach    . simvastatin (ZOCOR) 40 MG tablet Take 40 mg  by mouth  at bedtime     No current facility-administered medications for this visit.     PAST MEDICAL HISTORY: Past Medical History:  Diagnosis Date  . Arthritis   . Aspiration pneumonia (HCC)   . Asthma   . Cardiac arrhythmia    have pacemeaker  . Down syndrome   . GERD (gastroesophageal reflux disease)   .  Heart block   . Hypothyroidism   . Mental retardation   . Pulmonary emboli (HCC)   . Seizures (HCC)   . Sleep apnea with use of continuous positive airway pressure (CPAP)     PAST SURGICAL HISTORY: Past Surgical History:  Procedure Laterality Date  . PACEMAKER INSERTION  12/09/2015   [employer] Accolade MRI EL PPM implanted in PA for complete heart block and syncope  . PEG TUBE PLACEMENT    . TIBIA FRACTURE SURGERY Right     FAMILY HISTORY: Family History  Problem Relation Age of Onset  . Diabetes Father   . Heart disease Father   . Breast cancer Sister     SOCIAL HISTORY:  Social History   Social History  . Marital status: Single    Spouse name: N/A  . Number of children: N/A  . Years of education: N/A   Occupational History  . disabled    Social History Main Topics  . Smoking  status: Never Smoker  . Smokeless tobacco: Never Used  . Alcohol use No  . Drug use: No  . Sexual activity: Not on file   Other Topics Concern  . Not on file   Social History Narrative   LIves in group home      PHYSICAL EXAM   Vitals:   03/18/17 1309  BP: 130/64  Pulse: (!) 53  Resp: 18  Weight: 157 lb (71.2 kg)  Height: 4\' 7"  (1.397 m)    Not recorded      Body mass index is 36.49 kg/m.  PHYSICAL EXAMNIATION:  Gen: NAD, conversant, well nourised, obese, well groomed                     Cardiovascular: Regular rate rhythm, no peripheral edema, warm, nontender. Eyes: Conjunctivae clear without exudates or hemorrhage Neck: Supple, no carotid bruits. Pulmonary: Clear to auscultation bilaterally   NEUROLOGICAL EXAM:  MENTAL STATUS: Speech/Cognition: Slurred speech, facial feature consistent with Down syndrome, follow commands and exam CRANIAL NERVES: CN II: Visual fields are full to confrontation. Fundoscopic exam is normal with sharp discs and no vascular changes. Pupils are round equal and briskly reactive to light. CN III, IV, VI: extraocular movement are normal. No ptosis. CN V: Facial sensation is intact to pinprick in all 3 divisions bilaterally. Corneal responses are intact.  CN VII: Face is symmetric with normal eye closure and smile. CN VIII: Hearing is normal to rubbing fingers CN IX, X: Palate elevates symmetrically. Phonation is normal. CN XI: Head turning and shoulder shrug are intact CN XII: Tongue is midline with normal movements and no atrophy.  MOTOR: There is no pronator drift of out-stretched arms. Muscle bulk and tone are normal. Muscle strength is normal.  REFLEXES: Reflexes are 2+ and symmetric at the biceps, triceps, knees, and ankles. Plantar responses are flexor.  SENSORY: Intact to light touch, pinprick, positional sensation and vibratory sensation are intact in fingers and toes.  COORDINATION: Rapid alternating movements and fine  finger movements are intact. There is no dysmetria on finger-to-nose and heel-knee-shin.    GAIT/STANCE: Mildly unsteady   DIAGNOSTIC DATA (LABS, IMAGING, TESTING) - I reviewed patient records, labs, notes, testing and imaging myself where available.   ASSESSMENT AND PLAN  JONAY HITCHCOCK is a 61 y.o. female   Down syndrome Epilepsy  Keep Dilantin 200mg  bid  Increase Keppra xr 750mg  2 tabs qhs.  She has recurrent spells of brief drop on toilet.  Levert Feinstein, M.D.  Ph.D.  [facility] [address] [place], [place] [address] Ph: [phone] Fax: [phone]  CC:[name], MD

## 2017-03-25 ENCOUNTER — Telehealth: Payer: Self-pay | Admitting: *Deleted

## 2017-03-25 NOTE — Telephone Encounter (Addendum)
PA for levetiracetam ER approved by [facility] [phone]) with end date marked "until further notice".  Pt LT#[id number].  Attempted to reach patient's sister/cargiver, [name] - no answer and mailbox full  I have notified the pharmacy of approval and they will contact them for pick up.

## 2017-03-29 NOTE — Telephone Encounter (Signed)
On two separate occassions our devices have measured much lower O2 saturations: 01/2017 ONO on BIPAP with 2L> O2 sat < 88% for an hour 03/2017 CPAP titration > 14 cm H20, still had very low oxygen desaturation to 70% range  If they choose to believe that their device is more accurate then that is ultimately their decision that comes with the risk of cardiac arrhythmias and pulmonary hypertension. We recommend 3L O2 with BIPAP.

## 2017-03-29 NOTE — Telephone Encounter (Signed)
LM x 1 

## 2017-03-30 ENCOUNTER — Telehealth: Payer: Self-pay | Admitting: Pulmonary Disease

## 2017-03-30 NOTE — Telephone Encounter (Signed)
Spoke with Kaitlyn Powell. She stated that they will keep the patient on 2L of O2. She still feels 3L is too much at this time. Nothing else was needed at time of call.

## 2017-03-30 NOTE — Telephone Encounter (Signed)
Closing this message since the original message is still open.

## 2017-04-12 ENCOUNTER — Ambulatory Visit: Payer: Medicare Other | Admitting: Neurology

## 2017-04-12 ENCOUNTER — Ambulatory Visit (INDEPENDENT_AMBULATORY_CARE_PROVIDER_SITE_OTHER): Payer: Medicare Other | Admitting: Podiatry

## 2017-04-12 ENCOUNTER — Encounter: Payer: Self-pay | Admitting: Podiatry

## 2017-04-12 DIAGNOSIS — M79609 Pain in unspecified limb: Secondary | ICD-10-CM

## 2017-04-12 DIAGNOSIS — B351 Tinea unguium: Secondary | ICD-10-CM | POA: Diagnosis not present

## 2017-04-16 NOTE — Progress Notes (Signed)
   SUBJECTIVE Patient presents to office today complaining of elongated, thickened nails. Pain while ambulating in shoes. Patient is unable to trim their own nails.   Past Medical History:  Diagnosis Date  . Arthritis   . Aspiration pneumonia (HCC)   . Asthma   . Cardiac arrhythmia    have pacemeaker  . Down syndrome   . GERD (gastroesophageal reflux disease)   . Heart block   . Hypothyroidism   . Mental retardation   . Pulmonary emboli (HCC)   . Seizures (HCC)   . Sleep apnea with use of continuous positive airway pressure (CPAP)     OBJECTIVE General Patient is awake, alert, and oriented x 3 and in no acute distress. Derm Skin is dry and supple bilateral. Negative open lesions or macerations. Remaining integument unremarkable. Nails are tender, long, thickened and dystrophic with subungual debris, consistent with onychomycosis, 1-5 bilateral. No signs of infection noted. Vasc  DP and PT pedal pulses palpable bilaterally. Temperature gradient within normal limits.  Neuro Epicritic and protective threshold sensation diminished bilaterally.  Musculoskeletal Exam No symptomatic pedal deformities noted bilateral. Muscular strength within normal limits.  ASSESSMENT 1. Onychodystrophic nails 1-5 bilateral with hyperkeratosis of nails.  2. Onychomycosis of nail due to dermatophyte bilateral 3. Pain in foot bilateral  PLAN OF CARE 1. Patient evaluated today.  2. Instructed to maintain good pedal hygiene and foot care.  3. Mechanical debridement of nails 1-5 bilaterally performed using a nail nipper. Filed with dremel without incident.  4. Return to clinic in 3 mos.    Bethanny Toelle M. Brittini Brubeck, DPM [facility]  Dr. Hezakiah Champeau M. Edy Belt, DPM    [address]                                        [place], [place] [address]                Office [phone]  Fax [phone]     

## 2017-04-19 ENCOUNTER — Ambulatory Visit (INDEPENDENT_AMBULATORY_CARE_PROVIDER_SITE_OTHER): Payer: Medicare Other | Admitting: *Deleted

## 2017-04-19 ENCOUNTER — Ambulatory Visit: Payer: Medicare Other | Admitting: Podiatry

## 2017-04-19 DIAGNOSIS — I442 Atrioventricular block, complete: Secondary | ICD-10-CM

## 2017-04-19 NOTE — Progress Notes (Signed)
Remote pacemaker transmission.   

## 2017-04-22 ENCOUNTER — Encounter: Payer: Self-pay | Admitting: Cardiology

## 2017-04-23 ENCOUNTER — Institutional Professional Consult (permissible substitution): Payer: Medicare Other | Admitting: Pulmonary Disease

## 2017-04-26 LAB — CUP PACEART REMOTE DEVICE CHECK
Battery Remaining Longevity: 180 mo
Battery Remaining Percentage: 100 %
Brady Statistic RA Percent Paced: 30 %
Implantable Lead Implant Date: 20170508
Implantable Lead Location: 753859
Implantable Lead Location: 753860
Implantable Lead Model: 7740
Implantable Lead Model: 7741
Implantable Pulse Generator Implant Date: 20170508
Lead Channel Impedance Value: 631 Ohm
Lead Channel Setting Pacing Amplitude: 2 V
Lead Channel Setting Sensing Sensitivity: 2.5 mV
MDC IDC LEAD IMPLANT DT: 20170508
MDC IDC LEAD SERIAL: 1111
MDC IDC LEAD SERIAL: 1111
MDC IDC MSMT LEADCHNL RA IMPEDANCE VALUE: 573 Ohm
MDC IDC MSMT LEADCHNL RA PACING THRESHOLD AMPLITUDE: 0.6 V
MDC IDC MSMT LEADCHNL RA PACING THRESHOLD PULSEWIDTH: 0.4 ms
MDC IDC PG SERIAL: 747619
MDC IDC SESS DTM: 20180917063200
MDC IDC SET LEADCHNL RV PACING AMPLITUDE: 5 V
MDC IDC SET LEADCHNL RV PACING PULSEWIDTH: 1 ms
MDC IDC STAT BRADY RV PERCENT PACED: 2 %

## 2017-05-25 ENCOUNTER — Telehealth: Payer: Self-pay | Admitting: Neurology

## 2017-05-25 NOTE — Telephone Encounter (Signed)
Pt sister ([place] has asked that I add she is asking for a call back re: concern of pt, not so much about appointment but just with advise re: pt medication

## 2017-05-25 NOTE — Telephone Encounter (Signed)
Pt sister(on DPR) calling re: concern for pt, she states there has been an increase in her mini drop seizures.  There are concerns with balance and memory of pt as well.  Pt sister would like to be called if there is anyway of pt being seen before current scheduled appointment

## 2017-05-25 NOTE — Telephone Encounter (Signed)
[place]oke to [place] (sister and primary caregiver on HIPAA) - she is reporting an increase in patient's drop seizures, balance issues and memory concerns.  She feels like it is medication related and is asking for patient to be seen earlier for evaluation.  She has been placed on Megan's schedule, in an available time slot, on 05/26/17.

## 2017-05-26 ENCOUNTER — Encounter: Payer: Self-pay | Admitting: Adult Health

## 2017-05-26 ENCOUNTER — Ambulatory Visit (INDEPENDENT_AMBULATORY_CARE_PROVIDER_SITE_OTHER): Payer: Medicare Other | Admitting: Adult Health

## 2017-05-26 VITALS — BP 107/57 | HR 61 | Ht <= 58 in | Wt 161.8 lb

## 2017-05-26 DIAGNOSIS — R569 Unspecified convulsions: Secondary | ICD-10-CM | POA: Diagnosis not present

## 2017-05-26 DIAGNOSIS — Z5181 Encounter for therapeutic drug level monitoring: Secondary | ICD-10-CM | POA: Diagnosis not present

## 2017-05-26 MED ORDER — LEVETIRACETAM ER 500 MG PO TB24: 2000.0000 mg | ORAL_TABLET | Freq: Every day | ORAL | 11 refills | Status: DC

## 2017-05-26 NOTE — Progress Notes (Signed)
PATIENT: Kaitlyn Powell DOB: 11-01-1955  REASON FOR VISIT: follow up HISTORY FROM: patient  HISTORY OF PRESENT ILLNESS: HISTORY- Copied From Dr. Zannie Cove notes: Kaitlyn Powell is a 61 years old left-handed female, accompanied by her sister [name], who is also her power of attorney, seen in refer by her primary care physician  [name], to evaluate for seizure, initial evaluation was on January 11 2017.  I reviewed and summarized the referring and most recent hospital discharge from [facility] in September 2017, she had a history of Down syndrome, asthma, obstructive sleep apnea, on CPAP at night, hypothyroidism, seizure, sick sinus syndrome status post pacemaker, she was admitted to the hospital at [facility] on March 12 2016 with dyspnea and wheezing, was noted to have low-grade fever 37.6, oxygen saturation was 82%, chest x-ray showed Ldisease, she was treated with Zosyn and Vancomyocin, CT angiograms demonstrate small feeding defect in left lower lobe pulmonary artery, suspicious for PE, she was treated with IV heparin, was also seen for possible congestive heart failure, required ICU admission, daytime BiPAP, she had prolonged ICU stay in for 16 days, she was able to eventually wean off BiPAP, modified swallowing test confirmed swallowing dysfunction with silent aspiration, she was put on NG tube, later switched to Coumadin due to PE, but INR was difficult to stabilize in the therapeutic window,   Her acute hypoxic respiratory failure was thought due to the combination of pulmonary emboli, obstructive sleep apnea, congestive heart failure exacerbation and pneumonia, she was also treated with a short course of Solu-Medrol, echocardiogram August 11 showed ejection fraction of 60-65%, pulmonary hypertension likely secondary to diastolic heart failure, possible contribution from PE and L space disease,  She was seen by  hematologist, who suggested 3 months course of anticoagulation with Lovenox, she had prolonged rehabilitation since April 2017, regained significant recovery, she also noted to have thrombocytopenia, had PEG tube placement, this was placed on April 17 2016 without complication, postprocedure she had mild to moderate generalized abdominal pain, extensive evaluations, including normal or negative UA, hepatic panel, abdominal x-ray, she was able to tolerate the tube feeding well, next  Laboratory evaluations in September 2017, WBC 5.9, hemoglobin of 9.0, creatinine of 0.88,  She also had a lifelong history of seizure, previously was treated with Dilantin, macrocytic anemia with normal B12 level, considered due to prolonged use of Dilantin, there was a concern of interaction between Dilantin and other long-term by mouth anticoagulation treatment such as Coumadin and [name], she was discharged with Lovenox subcutaneous injection, PEG tube was removed in Nov 2017, she is on a mechanical soft diet, thickened liquid, she began to progress to small bite, able to take by mouth's by mouth  Most recent laboratory evaluation in May 2018, hemoglobin 10 point 7, normal CMP, creatinine of 0.96, normal liver functional test, slight elevated d-dimer 1.01  She is currently taking Dilantin 200 mg twice a day, she lives with her sister's family, she has stopped the Lovenox since November 2017,  She suffered seizure all her life, drop seizures, she is now having on daily basis, she had sudden onset of lost muscle tone, loss consciousness, lasting less than 1 minute,   UPDATE February 17 2017: She is accompanied by her sister at today's clinical visit, following last visit on January 11 2017, in attempt to change her antiepileptic medications tapering off the Dilantin, titrating up Keppra, she suffered significant complications,  While taking  Keppra 500 mg twice a day, decrease Dilantin to 200 mg every night, she  suffered generalized tonic-clonic seizure, with total of 6 generalized tonic-clonic seizure since, the last one was on January 28 2017, Dilantin 200 mg twice a day was reintroduced, she is now on lower dose of Keppra 500 mg twice a day,  She still drowsy, especially after morning dose of medication, she no longer has recurrent generalized tonic-clonic seizure, only had one drop attack with current combinations,  Before that last generalized seizure was in October 2017, she was also noted to be emotional.   We have personally reviewed MRI of the brain without contrast in July 2018: Congenital microcephaly consistent with her history of Down syndrome, moderate parenchymal brain volume loss, moderate small vessel disease  EEG showed moderate to severe background slowing, rare generalized spike slow waves,  UPDATE March 18 2017: she is now taking Dilantin ER 200 mg 1 tablet twice a day, and  keppra 500 mg twice a day  sister Reported drop attacks, sudden loss of muscle tone, lasting for a few second, no body jerking movement, but only happened while she was sitting on the toilet straining, she had 4 spells over past 2 weeks,  Her sister reported that adding Keppra has decreased the frequency of those spells, but I am not sure those could represent vasovagal syncope versus seizures  Today 05/26/17:  Kaitlyn Powell is a 61 year old female with a history of Down syndrome and seizures. She returns today for follow-up visit. At the last visit Keppra ER was increased to1500 mg at bedtime. She remains on Dilantin 100 mg twice a day. Her sister reports that she had an increase in "drop attacks." In August she had 3, in September she had 6 and in October she has had 8. They report that these episodes always occur when she is on the toilet. She reports in the past when Keppra was initiated they did see a decrease in these events but recently they have increased again. The patient has not changed  Or missed  any medication. The patient's brother-in-law does not feel that it is a vasovagal event because she is disoriented and slightly confused after the event. She returns today for an evaluation.     REVIEW OF SYSTEMS: Out of a complete 14 system review of symptoms, the patient complains only of the following symptoms, and all other reviewed systems are negative.  See history of present illness  ALLERGIES: Allergies  Allergen Reactions  . Cephalexin Other (See Comments)    Seizures     HOME MEDICATIONS: Outpatient Medications Prior to Visit  Medication Sig Dispense Refill  . citalopram (CELEXA) 20 MG tablet GIVE 1 TABLET VIA G TUBE EVERY DAY    . cyanocobalamin (TH VITAMIN B12) 100 MCG tablet Take 100 mcg by mouth every morning.     . famotidine (PEPCID) 40 MG/5ML suspension Take 20 mg  by mouth daily    . folic acid (FOLVITE) 1 MG tablet Take 1 mg  by mouth  daily    . ipratropium-albuterol (DUONEB) 0.5-2.5 (3) MG/3ML SOLN Take 1 ampule by nebulization 2 (two) times daily.    . Levetiracetam (KEPPRA XR) 750 MG TB24 Take 2 tablets (1,500 mg total) by mouth at bedtime. 60 tablet 11  . levETIRAcetam (KEPPRA) 500 MG tablet Take 3 tablets (1,500 mg total) by mouth 2 (two) times daily. 180 tablet 0  . levothyroxine (SYNTHROID, LEVOTHROID) 150 MCG tablet Take one tablet (150 mcg total)  by  mouth  daily    . Melatonin 1 MG TABS Take 1 mg by mouth at bedtime.     . OXYGEN Inhale 2 L into the lungs as directed. With CPAP    . phenytoin (DILANTIN) 200 MG ER capsule Take 1 capsule (200 mg total) by mouth at bedtime. 90 capsule 4  . risperiDONE (RISPERDAL) 1 MG tablet Take 1 mg by mouth every morning and 2 mg at night    . senna (SENOKOT) 176 MG/5ML SYRP as directed. As needed for stomach    . simvastatin (ZOCOR) 40 MG tablet Take 40 mg  by mouth  at bedtime     No facility-administered medications prior to visit.     PAST MEDICAL HISTORY: Past Medical History:  Diagnosis Date  . Arthritis   .  Aspiration pneumonia (HCC)   . Asthma   . Cardiac arrhythmia    have pacemeaker  . Down syndrome   . GERD (gastroesophageal reflux disease)   . Heart block   . Hypothyroidism   . Mental retardation   . Pulmonary emboli (HCC)   . Seizures (HCC)   . Sleep apnea with use of continuous positive airway pressure (CPAP)     PAST SURGICAL HISTORY: Past Surgical History:  Procedure Laterality Date  . PACEMAKER INSERTION  12/09/2015   [employer] Accolade MRI EL PPM implanted in PA for complete heart block and syncope  . PEG TUBE PLACEMENT    . TIBIA FRACTURE SURGERY Right     FAMILY HISTORY: Family History  Problem Relation Age of Onset  . Diabetes Father   . Heart disease Father   . Breast cancer Sister     SOCIAL HISTORY: Social History   Social History  . Marital status: Single    Spouse name: N/A  . Number of children: N/A  . Years of education: N/A   Occupational History  . disabled    Social History Main Topics  . Smoking status: Never Smoker  . Smokeless tobacco: Never Used  . Alcohol use No  . Drug use: No  . Sexual activity: Not on file   Other Topics Concern  . Not on file   Social History Narrative   LIves in group home       PHYSICAL EXAM  Vitals:   05/26/17 1052  BP: (!) 107/57  Pulse: 61  Weight: 161 lb 12.8 oz (73.4 kg)  Height: 4\' 7"  (1.397 m)   There is no height or weight on file to calculate BMI.  Generalized: Well developed, in no acute distress   Neurological examination  Mentation: Alert Follows all commands. Cranial nerve II-XII: Pupils were equal round reactive to light. Extraocular movements were full, visual field were full on confrontational test. Head turning and shoulder shrug  were normal and symmetric. Motor: The motor testing reveals 5 over 5 strength of all 4 extremities. Good symmetric motor tone is noted throughout.  Sensory: Sensory testing is intact to soft touch on all 4 extremities. No evidence of  extinction is noted.   Gait and station: Gait is slightly unsteady. Tandem gait not attempted.   DIAGNOSTIC DATA (LABS, IMAGING, TESTING) - I reviewed patient records, labs, notes, testing and imaging myself where available.  Lab Results  Component Value Date   WBC 5.4 05/11/2016   HGB 10.1 (L) 05/11/2016   HCT 32.3 (L) 05/11/2016   MCV 109.1 (H) 05/11/2016   PLT 128 (L) 05/11/2016      Component Value Date/Time  NA 136 05/11/2016 1920   K 4.2 05/11/2016 1920   CL 98 (L) 05/11/2016 1920   CO2 29 05/11/2016 1920   GLUCOSE 94 05/11/2016 1920   BUN 21 (H) 05/11/2016 1920   CREATININE 1.00 02/01/2017 1417   CALCIUM 9.2 05/11/2016 1920   PROT 7.7 07/22/2016 1419   ALBUMIN 3.5 (L) 07/22/2016 1419   AST 18 07/22/2016 1419   ALT 10 07/22/2016 1419   ALKPHOS 117 07/22/2016 1419   BILITOT <0.2 07/22/2016 1419   GFRNONAA 60 (L) 02/01/2017 1417   GFRAA >60 02/01/2017 1417     ASSESSMENT AND PLAN 61 y.o. year old female  has a past medical history of Arthritis; Aspiration pneumonia (HCC); Asthma; Cardiac arrhythmia; Down syndrome; GERD (gastroesophageal reflux disease); Heart block; Hypothyroidism; Mental retardation; Pulmonary emboli (HCC); Seizures (HCC); and Sleep apnea with use of continuous positive airway pressure (CPAP). here with:  1. Seizures  The patient has had an increase in "drop attacks." These events always occur on the toilet. These events may not represent seizures but rather vasovagal events. The family tends to disagree. I will increase Keppra to 2000 mg at bedtime. She will continue on Dilantin. I will check blood work today. The patient's gait is slightly unsteady. It may be beneficial for her to begin using her walker again when ambulating. Again advised that if these events continue they should let us know. She will follow-up in 6 months or sooner if needed.     Butch PennyMegan Haili Donofrio, MSN, NP-C 05/26/2017, 10:56 AM [facility] [address],  [address] [place], [place] [address] [phone]

## 2017-05-26 NOTE — Patient Instructions (Signed)
Your Plan:  Continue Dilantin  Change Keppra XR to 500 mg- 4 tablets at bedtime Blood work today Can consider Namenda for memory  Thank you for coming [facility]aitlyn Powell at [facility]. I hope we have been able to provide you high quality care today.  You may receive a patient satisfaction survey over the next few weeks. We would appreciate your feedback and comments so that we may continue to improve ourselves and the health of our patients.  Memantine Tablets What is this medicine? MEMANTINE (MEM an teen) is used to treat dementia caused by Alzheimer's disease. This medicine may be used for other purposes; ask your health care provider or pharmacist if you have questions. COMMON BRAND NAME(S): Namenda What should I tell my health care provider before I take this medicine? They need to know if you have any of these conditions: -difficulty passing urine -kidney disease -liver disease -seizures -an unusual or allergic reaction to memantine, other medicines, foods, dyes, or preservatives -pregnant or trying to get pregnant -breast-feeding How should I use this medicine? Take this medicine by mouth with a glass of water. Follow the directions on the prescription label. You may take this medicine with or without food. Take your doses at regular intervals. Do not take your medicine more often than directed. Continue to take your medicine even if you feel better. Do not stop taking except on the advice of your doctor or health care professional. Talk to your pediatrician regarding the use of this medicine in children. Special care may be needed. Overdosage: If you think you have taken too much of this medicine contact a poison control center or emergency room at once. NOTE: This medicine is only for you. Do not share this medicine with others. What if I miss a dose? If you miss a dose, take it as soon as you can. If it is almost time for your next dose, take only that dose. Do not  take double or extra doses. If you do not take your medicine for several days, contact your health care provider. Your dose may need to be changed. What may interact with this medicine? -acetazolamide -amantadine -cimetidine -dextromethorphan -dofetilide -hydrochlorothiazide -ketamine -metformin -methazolamide -quinidine -ranitidine -sodium bicarbonate -triamterene This list may not describe all possible interactions. Give your health care provider a list of all the medicines, herbs, non-prescription drugs, or dietary supplements you use. Also tell them if you smoke, drink alcohol, or use illegal drugs. Some items may interact with your medicine. What should I watch for while using this medicine? Visit your doctor or health care professional for regular checks on your progress. Check with your doctor or health care professional if there is no improvement in your symptoms or if they get worse. You may get drowsy or dizzy. Do not drive, use machinery, or do anything that needs mental alertness until you know how this drug affects you. Do not stand or sit up quickly, especially if you are an older patient. This reduces the risk of dizzy or fainting spells. Alcohol can make you more drowsy and dizzy. Avoid alcoholic drinks. What side effects may I notice from receiving this medicine? Side effects that you should report to your doctor or health care professional as soon as possible: -allergic reactions like skin rash, itching or hives, swelling of the face, lips, or tongue -agitation or a feeling of restlessness -depressed mood -dizziness -hallucinations -redness, blistering, peeling or loosening of the skin, including inside the mouth -seizures -vomiting Side  effects that usually do not require medical attention (report to your doctor or health care professional if they continue or are bothersome): -constipation -diarrhea -headache -nausea -trouble sleeping This list may not describe  all possible side effects. Call your doctor for medical advice about side effects. You may report side effects to FDA at 1-800-FDA-1088. Where should I keep my medicine? Keep out of the reach of children. Store at room temperature between 15 degrees and 30 degrees C (59 degrees and 86 degrees F). Throw away any unused medicine after the expiration date. NOTE: This sheet is a summary. It may not cover all possible information. If you have questions about this medicine, talk to your doctor, pharmacist, or health care provider.  2018 Elsevier/Gold Standard (2013-05-08 14:10:42)

## 2017-05-27 LAB — COMPREHENSIVE METABOLIC PANEL
ALK PHOS: 127 IU/L — AB (ref 39–117)
ALT: 11 IU/L (ref 0–32)
AST: 17 IU/L (ref 0–40)
Albumin/Globulin Ratio: 0.8 — ABNORMAL LOW (ref 1.2–2.2)
Albumin: 3.5 g/dL — ABNORMAL LOW (ref 3.6–4.8)
BUN / CREAT RATIO: 29 — AB (ref 12–28)
BUN: 23 mg/dL (ref 8–27)
CHLORIDE: 97 mmol/L (ref 96–106)
CO2: 27 mmol/L (ref 20–29)
Calcium: 9.1 mg/dL (ref 8.7–10.3)
Creatinine, Ser: 0.8 mg/dL (ref 0.57–1.00)
GFR calc Af Amer: 92 mL/min/{1.73_m2} (ref >59)
GFR calc non Af Amer: 80 mL/min/{1.73_m2} (ref >59)
GLUCOSE: 67 mg/dL (ref 65–99)
Globulin, Total: 4.2 g/dL (ref 1.5–4.5)
Potassium: 4.9 mmol/L (ref 3.5–5.2)
Sodium: 138 mmol/L (ref 134–144)
Total Protein: 7.7 g/dL (ref 6.0–8.5)

## 2017-05-27 LAB — CBC WITH DIFFERENTIAL/PLATELET
BASOS ABS: 0.1 10*3/uL (ref 0.0–0.2)
Basos: 1 %
EOS (ABSOLUTE): 0.3 10*3/uL (ref 0.0–0.4)
Eos: 5 %
HEMOGLOBIN: 11.7 g/dL (ref 11.1–15.9)
Hematocrit: 35.6 % (ref 34.0–46.6)
IMMATURE GRANULOCYTES: 0 %
Immature Grans (Abs): 0 10*3/uL (ref 0.0–0.1)
LYMPHS ABS: 1 10*3/uL (ref 0.7–3.1)
Lymphs: 18 %
MCH: 33.6 pg — ABNORMAL HIGH (ref 26.6–33.0)
MCHC: 32.9 g/dL (ref 31.5–35.7)
MCV: 102 fL — ABNORMAL HIGH (ref 79–97)
MONOCYTES: 14 %
Monocytes Absolute: 0.8 10*3/uL (ref 0.1–0.9)
NEUTROS PCT: 62 %
Neutrophils Absolute: 3.2 10*3/uL (ref 1.4–7.0)
Platelets: 163 10*3/uL (ref 150–379)
RBC: 3.48 x10E6/uL — AB (ref 3.77–5.28)
RDW: 14.2 % (ref 12.3–15.4)
WBC: 5.2 10*3/uL (ref 3.4–10.8)

## 2017-05-27 LAB — PHENYTOIN LEVEL, TOTAL: PHENYTOIN (DILANTIN), SERUM: 15.5 ug/mL (ref 10.0–20.0)

## 2017-05-27 NOTE — Progress Notes (Signed)
I have reviewed and agreed above plan. 

## 2017-05-31 ENCOUNTER — Encounter (HOSPITAL_BASED_OUTPATIENT_CLINIC_OR_DEPARTMENT_OTHER): Payer: Self-pay

## 2017-05-31 ENCOUNTER — Emergency Department (HOSPITAL_BASED_OUTPATIENT_CLINIC_OR_DEPARTMENT_OTHER): Payer: Medicare Other

## 2017-05-31 ENCOUNTER — Emergency Department (HOSPITAL_BASED_OUTPATIENT_CLINIC_OR_DEPARTMENT_OTHER)
Admission: EM | Admit: 2017-05-31 | Discharge: 2017-05-31 | Disposition: A | Discharge status: Home / Self Care | Payer: Medicare Other | Attending: Emergency Medicine | Admitting: Emergency Medicine

## 2017-05-31 DIAGNOSIS — Z79899 Other long term (current) drug therapy: Secondary | ICD-10-CM | POA: Insufficient documentation

## 2017-05-31 DIAGNOSIS — W19XXXA Unspecified fall, initial encounter: Secondary | ICD-10-CM

## 2017-05-31 DIAGNOSIS — Y92512 Supermarket, store or market as the place of occurrence of the external cause: Secondary | ICD-10-CM | POA: Diagnosis not present

## 2017-05-31 DIAGNOSIS — Y9301 Activity, walking, marching and hiking: Secondary | ICD-10-CM | POA: Insufficient documentation

## 2017-05-31 DIAGNOSIS — S0990XA Unspecified injury of head, initial encounter: Secondary | ICD-10-CM

## 2017-05-31 DIAGNOSIS — F99 Mental disorder, not otherwise specified: Secondary | ICD-10-CM | POA: Diagnosis not present

## 2017-05-31 DIAGNOSIS — W010XXA Fall on same level from slipping, tripping and stumbling without subsequent striking against object, initial encounter: Secondary | ICD-10-CM | POA: Diagnosis not present

## 2017-05-31 DIAGNOSIS — Q909 Down syndrome, unspecified: Secondary | ICD-10-CM | POA: Diagnosis not present

## 2017-05-31 DIAGNOSIS — E039 Hypothyroidism, unspecified: Secondary | ICD-10-CM | POA: Diagnosis not present

## 2017-05-31 DIAGNOSIS — Y999 Unspecified external cause status: Secondary | ICD-10-CM | POA: Diagnosis not present

## 2017-05-31 DIAGNOSIS — J45909 Unspecified asthma, uncomplicated: Secondary | ICD-10-CM | POA: Insufficient documentation

## 2017-05-31 NOTE — ED Provider Notes (Signed)
[facility] EMERGENCY DEPARTMENT Provider Note   CSN: [id number] Arrival date & time: 05/31/17  1449     History   Chief Complaint Chief Complaint  Patient presents with  . Fall    HPI AFUA HOOTS is a 61 y.o. female.  Patient was at the grocery store slipped and fell backwards.  No loss of consciousness.  No syncope.  Patient fall was witnessed.  Patient has Down syndrome.  Patient very pleasant and very cooperative.  No specific complaints.      Past Medical History:  Diagnosis Date  . Arthritis   . Aspiration pneumonia (HCC)   . Asthma   . Cardiac arrhythmia    have pacemeaker  . Down syndrome   . GERD (gastroesophageal reflux disease)   . Heart block   . Hypothyroidism   . Mental retardation   . Pulmonary emboli (HCC)   . Seizures (HCC)   . Sleep apnea with use of continuous positive airway pressure (CPAP)     Patient Active Problem List   Diagnosis Date Noted  . OSA (obstructive sleep apnea) 03/03/2017  . Down syndrome 01/11/2017  . Seizure disorder (HCC) 01/11/2017    Past Surgical History:  Procedure Laterality Date  . PACEMAKER INSERTION  12/09/2015   [employer] Accolade MRI EL PPM implanted in PA for complete heart block and syncope  . PEG TUBE PLACEMENT    . TIBIA FRACTURE SURGERY Right     OB History    No data available       Home Medications    Prior to Admission medications   Medication Sig Start Date End Date Taking? Authorizing Provider  citalopram (CELEXA) 20 MG tablet GIVE 1 TABLET VIA G TUBE EVERY DAY 09/08/16   [provider]  cyanocobalamin (TH VITAMIN B12) 100 MCG tablet Take 100 mcg by mouth every morning.     [provider]  famotidine (PEPCID) 40 MG/5ML suspension Take 20 mg  by mouth daily    [provider]  folic acid (FOLVITE) 1 MG tablet Take 1 mg  by mouth  daily    [provider]  ipratropium-albuterol (DUONEB) 0.5-2.5 (3) MG/3ML SOLN Take 1 ampule by  nebulization 2 (two) times daily. 07/03/16   [provider]  levETIRAcetam (KEPPRA XR) 500 MG 24 hr tablet Take 4 tablets (2,000 mg total) by mouth daily. 05/26/17   Butch Penny, NP  levothyroxine (SYNTHROID, LEVOTHROID) 150 MCG tablet Take one tablet (150 mcg total)  by mouth  daily 05/21/16   [provider]  Melatonin 1 MG TABS Take 1 mg by mouth at bedtime.     [provider]  OXYGEN Inhale 2 L into the lungs as directed. With CPAP    [provider]  phenytoin (DILANTIN) 200 MG ER capsule Take 1 capsule (200 mg total) by mouth at bedtime. Patient taking differently: Take 100 mg by mouth 2 (two) times daily.  03/18/17   Levert Feinstein, MD  risperiDONE (RISPERDAL) 1 MG tablet Take 1 mg by mouth every morning and 2 mg at night    [provider]  senna (SENOKOT) 176 MG/5ML SYRP as directed. As needed for stomach    [provider]  simvastatin (ZOCOR) 40 MG tablet Take 40 mg  by mouth  at bedtime    [provider]    Family History Family History  Problem Relation Age of Onset  . Diabetes Father   . Heart disease Father   .  Breast cancer Sister     Social History Social History  Substance Use Topics  . Smoking status: Never Smoker  . Smokeless tobacco: Never Used  . Alcohol use No     Allergies   Cephalexin   Review of Systems Review of Systems  Constitutional: Negative for fever.  HENT: Negative for congestion.   Eyes: Negative for redness.  Respiratory: Negative for shortness of breath.   Cardiovascular: Negative for chest pain.  Gastrointestinal: Negative for abdominal pain.  Genitourinary: Negative for dysuria.  Musculoskeletal: Negative for back pain and neck pain.  Skin: Negative for wound.  Neurological: Positive for headaches. Negative for syncope.  Hematological: Does not bruise/bleed easily.  Psychiatric/Behavioral: Negative for confusion.     Physical Exam Updated Vital Signs BP (!) 126/51  (BP Location: Right Arm)   Pulse (!) 57   Temp 97.9 F (36.6 C) (Oral)   Resp 16   SpO2 100%   Physical Exam  Constitutional: She appears well-developed and well-nourished. No distress.  HENT:  Head: Normocephalic and atraumatic.  Mouth/Throat: Oropharynx is clear and moist.  Eyes: Pupils are equal, round, and reactive to light. Conjunctivae and EOM are normal.  Neck: Normal range of motion. Neck supple.  Cardiovascular: Normal rate, regular rhythm and normal heart sounds.   Pulmonary/Chest: Effort normal and breath sounds normal. No respiratory distress.  Abdominal: Soft. Bowel sounds are normal. There is no tenderness.  Musculoskeletal: Normal range of motion.  Neurological: She is alert. No cranial nerve deficit or sensory deficit. She exhibits normal muscle tone. Coordination normal.  Skin: Skin is warm.  Nursing note and vitals reviewed.    ED Treatments / Results  Labs (all labs ordered are listed, but only abnormal results are displayed) Labs Reviewed - No data to display  EKG  EKG Interpretation None       Radiology Ct Head Wo Contrast  Result Date: 05/31/2017 CLINICAL DATA:  Head trauma after fall. EXAM: CT HEAD WITHOUT CONTRAST CT CERVICAL SPINE WITHOUT CONTRAST TECHNIQUE: Multidetector CT imaging of the head and cervical spine was performed following the standard protocol without intravenous contrast. Multiplanar CT image reconstructions of the cervical spine were also generated. COMPARISON:  Brain MRI dated by second 2018. FINDINGS: CT HEAD FINDINGS Brain: No evidence of acute infarction, hemorrhage, hydrocephalus, extra-axial collection or mass lesion/mass effect. Unchanged moderate cerebral atrophy with ex vacuo dilatation of the ventricles, advanced for age. Stable chronic microvascular ischemic white matter disease. Vascular: No hyperdense vessel or unexpected calcification. Skull: No fracture or focal lesion. Sinuses/Orbits: Air-fluid level in the right  maxillary sinus. The orbits are unremarkable. Other: None. CT CERVICAL SPINE FINDINGS Alignment: Straightening of the normal cervical lordosis. No traumatic malalignment. Skull base and vertebrae: Congenital nonunion of the posterior arch of C1. No fracture or primary bone lesion. Soft tissues and spinal canal: No prevertebral fluid or swelling. No visible canal hematoma. Disc levels: Moderate degenerative disc disease and uncovertebral hypertrophy from C3-C4 through C6-C7. Scattered right-sided facet arthropathy resulting in mild right neuroforaminal stenosis at C2-C3, C3-C4, and C5-C6. Upper chest: Negative. Other: None. IMPRESSION: 1. No acute intracranial abnormality. Stable moderate cerebral atrophy, advanced for age. 2. No acute cervical spine fracture. Moderate multilevel degenerative changes throughout the cervical spine, as described above. 3. Air-fluid level in the right maxillary sinus. Correlate for sinusitis. Electronically Signed   By: Obie Dredge M.D.   On: 05/31/2017 16:30   Ct Cervical Spine Wo Contrast  Result Date: 05/31/2017 CLINICAL DATA:  Head trauma after  fall. EXAM: CT HEAD WITHOUT CONTRAST CT CERVICAL SPINE WITHOUT CONTRAST TECHNIQUE: Multidetector CT imaging of the head and cervical spine was performed following the standard protocol without intravenous contrast. Multiplanar CT image reconstructions of the cervical spine were also generated. COMPARISON:  Brain MRI dated by second 2018. FINDINGS: CT HEAD FINDINGS Brain: No evidence of acute infarction, hemorrhage, hydrocephalus, extra-axial collection or mass lesion/mass effect. Unchanged moderate cerebral atrophy with ex vacuo dilatation of the ventricles, advanced for age. Stable chronic microvascular ischemic white matter disease. Vascular: No hyperdense vessel or unexpected calcification. Skull: No fracture or focal lesion. Sinuses/Orbits: Air-fluid level in the right maxillary sinus. The orbits are unremarkable. Other: None. CT  CERVICAL SPINE FINDINGS Alignment: Straightening of the normal cervical lordosis. No traumatic malalignment. Skull base and vertebrae: Congenital nonunion of the posterior arch of C1. No fracture or primary bone lesion. Soft tissues and spinal canal: No prevertebral fluid or swelling. No visible canal hematoma. Disc levels: Moderate degenerative disc disease and uncovertebral hypertrophy from C3-C4 through C6-C7. Scattered right-sided facet arthropathy resulting in mild right neuroforaminal stenosis at C2-C3, C3-C4, and C5-C6. Upper chest: Negative. Other: None. IMPRESSION: 1. No acute intracranial abnormality. Stable moderate cerebral atrophy, advanced for age. 2. No acute cervical spine fracture. Moderate multilevel degenerative changes throughout the cervical spine, as described above. 3. Air-fluid level in the right maxillary sinus. Correlate for sinusitis. Electronically Signed   By: Obie DredgeWilliam T Derry M.D.   On: 05/31/2017 16:30    Procedures Procedures (including critical care time)  Medications Ordered in ED Medications - No data to display   Initial Impression / Assessment and Plan / ED Course  I have reviewed the triage vital signs and the nursing notes.  Pertinent labs & imaging results that were available during my care of the patient were reviewed by me and considered in my medical decision making (see chart for details).    Patient has Down syndrome.  But patient is very alert very cooperative.  Patient without any complaints other than a little bit of a mild headache.  CT of head and neck without any acute findings.  Her concussion information provided.  Patient will be treated symptomatically and will return for any new or worse symptoms or follow-up with her doctor.   Final Clinical Impressions(s) / ED Diagnoses   Final diagnoses:  Fall, initial encounter  Injury of head, initial encounter    New Prescriptions New Prescriptions   No medications on file     Vanetta MuldersZackowski,  Ysidro Ramsay, MD 05/31/17 1807

## 2017-05-31 NOTE — ED Notes (Signed)
ED Provider at bedside. 

## 2017-05-31 NOTE — ED Triage Notes (Signed)
Pt fell at grocery store approx 1pm-was sent here by [facility] for possible CT r/t to head injury-per sister pt struck back of head on the floor-only pain site c/o-no break in skin noted-presents to triage in w/c-NAD

## 2017-05-31 NOTE — Discharge Instructions (Signed)
Return for any new or worse symptoms.  Follow-up with your doctor as needed.  CT of head and neck without any acute findings.  Still could have concussion symptoms.  Information provided.

## 2017-06-14 ENCOUNTER — Telehealth: Payer: Self-pay | Admitting: Adult Health

## 2017-06-14 NOTE — Telephone Encounter (Signed)
Called sister back . She stated that the patient has been compliant with seizure medications. She stated that she and her husband discussed home EEG with NP at last office visit if the increase in medication didn't work. [name] stated these episodes were a little different; the patient fell out on the floor, began staring, was confused, but she didn't black out. This RN stated will discuss with NP and she will get a call about setting it up. She verbalized understanding, appreciation.

## 2017-06-14 NOTE — Telephone Encounter (Signed)
LVM for sister, Charlotte SanesMary POA and requested call back. Advised in VM this RN wanted to know if patient had been compliant in medications since Keppra was increased by NP at last office visit.

## 2017-06-14 NOTE — Telephone Encounter (Signed)
Patient's sister [name] is returning your call.

## 2017-06-14 NOTE — Telephone Encounter (Signed)
Called patient's sister, Kaitlyn Powell and informed her that Kaitlyn Powell ordered a 72 hr EEG with [facility], and the order has been faxed  Advised her that since the events sometimes occur when her sister is in the bathroom, she will need to be sure the EEG monitors are in place when patient goes to the bathroom. Gave her their phone number and advised she may call if she hasn't heard from them in several days. She verbalized understanding, appreciation.

## 2017-06-14 NOTE — Telephone Encounter (Signed)
Patient sister [name] called stating that her sister had some sort of Seizer of Friday and Saturday. They were walking her to the bedroom and she stated her leg hurt and she fell out and her eyes rolled back. Her sister stating about having a EEG at home.

## 2017-06-15 ENCOUNTER — Telehealth: Payer: Self-pay | Admitting: *Deleted

## 2017-06-15 NOTE — Telephone Encounter (Signed)
Received call from [place] stating [facility] needs EEG report from past year, required by insurance.  EEG report successfully faxed .

## 2017-06-16 ENCOUNTER — Ambulatory Visit (INDEPENDENT_AMBULATORY_CARE_PROVIDER_SITE_OTHER): Payer: Medicare Other | Admitting: Pulmonary Disease

## 2017-06-16 ENCOUNTER — Encounter: Payer: Self-pay | Admitting: Pulmonary Disease

## 2017-06-16 VITALS — BP 118/66 | HR 50 | Ht <= 58 in | Wt 157.0 lb

## 2017-06-16 DIAGNOSIS — R55 Syncope and collapse: Secondary | ICD-10-CM

## 2017-06-16 DIAGNOSIS — G4733 Obstructive sleep apnea (adult) (pediatric): Secondary | ICD-10-CM

## 2017-06-16 DIAGNOSIS — G4734 Idiopathic sleep related nonobstructive alveolar hypoventilation: Secondary | ICD-10-CM

## 2017-06-16 DIAGNOSIS — R1310 Dysphagia, unspecified: Secondary | ICD-10-CM | POA: Diagnosis not present

## 2017-06-16 DIAGNOSIS — J690 Pneumonitis due to inhalation of food and vomit: Secondary | ICD-10-CM | POA: Diagnosis not present

## 2017-06-16 MED ORDER — FLUTICASONE PROPIONATE 50 MCG/ACT NA SUSP
2.0000 | Freq: Every day | NASAL | 11 refills | Status: DC
Start: 2017-06-16 — End: 2017-09-02

## 2017-06-16 NOTE — Patient Instructions (Signed)
Obstructive sleep apnea: Keep using BiPAP at night We will adjust the amount of oxygen flow to 3 L/min Let us know if she seems to be too sleepy in the mornings  Chronic respiratory failure with hypoxemia: As above, we will adjust the amount of oxygen to 3 L/min  Dysphagia with chronic aspiration: Continue to follow the recommendations of pured diet  Syncope: If the workup from the neuro doctor is unrevealing then I recommend an echocardiogram  Allergic rhinitis: We will refill the prescription for fluticasone  We will see back in 6 months or sooner if needed

## 2017-06-16 NOTE — Progress Notes (Signed)
Subjective:    Patient ID: Kaitlyn Powell, female    DOB: [birth year], 61 y.o.   MRN: [id number]  Synopsis: Referred in July 2018 for evaluation of asthma and pulmonary embolism. The pulmonary embolism was diagnosed in 2017.  That is her only PE and it was provoked by being sedentary in a wheelchair.  She has a significant history of aspiration pneumonia.  Has a past medical history significant for Down syndrome. Had a PEG tube which was removed in November 2017.  She has used BIPAP with oxygen since 2008.   HPI Chief Complaint  Patient presents with  . Follow-up    pt states she is doing well, does note occasional dyspnea, cough.      Kaitlyn Powell has been doing well. Her transition to home has been.   Her family has been checking her oxygen level 2-3 times per night and they say that her oxygen level is around 96-97%. No problems with cough or pneumonia.  Her sister had a cold recently, but they haven't had much trouble. No problems breathing during the daytime.  She has not been as active as they would like her to be. They have had a little trouble with her seizure medicine.  They've had a couple of episodes of "drop seizure".   Past Medical History:  Diagnosis Date  . Arthritis   . Aspiration pneumonia (HCC)   . Asthma   . Cardiac arrhythmia    have pacemeaker  . Down syndrome   . GERD (gastroesophageal reflux disease)   . Heart block   . Hypothyroidism   . Mental retardation   . Pulmonary emboli (HCC)   . Seizures (HCC)   . Sleep apnea with use of continuous positive airway pressure (CPAP)         Review of Systems  Constitutional: Negative for fever and unexpected weight change.  HENT: Negative for nosebleeds, postnasal drip, rhinorrhea, sinus pressure, sneezing, sore throat and trouble swallowing.   Respiratory: Positive for cough and shortness of breath. Negative for chest tightness and wheezing.   Cardiovascular: Negative for palpitations and leg swelling.      Objective:   Physical Exam Vitals:   06/16/17 1015  BP: 118/66  Pulse: (!) 50  SpO2: 96%  Weight: 157 lb (71.2 kg)  Height: 4\' 7"  (1.397 m)    Gen: chronically ill appearing HENT: OP clear, Poor dentition, thick neck supple PULM: Crackles bases B, normal percussion CV: RRR, no mgr, trace edema GI: BS+, soft, nontender Derm: no cyanosis or rash Psyche: normal mood and affect    CBC    Component Value Date/Time   WBC 5.2 05/26/2017 1141   WBC 5.4 05/11/2016 1920   RBC 3.48 (L) 05/26/2017 1141   RBC 2.96 (L) 05/11/2016 1920   HGB 11.7 05/26/2017 1141   HCT 35.6 05/26/2017 1141   PLT 163 05/26/2017 1141   MCV 102 (H) 05/26/2017 1141   MCH 33.6 (H) 05/26/2017 1141   MCH 34.1 (H) 05/11/2016 1920   MCHC 32.9 05/26/2017 1141   MCHC 31.3 05/11/2016 1920   RDW 14.2 05/26/2017 1141   LYMPHSABS 1.0 05/26/2017 1141   MONOABS 0.5 05/11/2016 1920   EOSABS 0.3 05/26/2017 1141   BASOSABS 0.1 05/26/2017 1141    Records from her physicians in [facility] [place] reviewed, they note that she has a past medical history significant for Down syndrome, pulmonary embolism, PEG tube placement, hypothyroidism and Dilantin use for seizure disorder.       Assessment &  Plan:  OSA (obstructive sleep apnea) - Plan: Ambulatory Referral for DME  Aspiration pneumonia, unspecified aspiration pneumonia type, unspecified laterality, unspecified part of lung (HCC)  Dysphagia, unspecified type  Nocturnal hypoxia  Syncope, unspecified syncope type  Kaitlyn Powell has been doing well from a sleep apnea standpoint, though I remain concerned that her family was not interested in adjusting the dose of oxygen.  Today I counseled him on the fact that she had 2 separate recordings while wearing her BiPAP mask that showed that her O2 saturation was in the 70% range for more than an hour.  Today they were willing to increase the flow rate to 3 L.  She also describes syncope recently.  The family thinks  this may be related to seizure activity.  I advised that if they are unable to find clear evidence of seizure activity from the workup from the neurologist then she would need to have an echocardiogram to assess for pulmonary hypertension as she is at very high risk for this condition.  Plan: Obstructive sleep apnea: Keep using BiPAP at night We will adjust the amount of oxygen flow to 3 L/min Let us know if she seems to be too sleepy in the mornings  Chronic respiratory failure with hypoxemia: As above, we will adjust the amount of oxygen to 3 L/min  Dysphagia with chronic aspiration: Continue to follow the recommendations of pured diet  Syncope: If the workup from the neuro doctor is unrevealing then I recommend an echocardiogram  Allergic rhinitis: We will refill the prescription for fluticasone  We will see back in 6 months or sooner if needed   Current Outpatient Medications:  .  citalopram (CELEXA) 20 MG tablet, GIVE 1 TABLET VIA G TUBE EVERY DAY, Disp: , Rfl:  .  cyanocobalamin (TH VITAMIN B12) 100 MCG tablet, Take 100 mcg by mouth every morning. , Disp: , Rfl:  .  famotidine (PEPCID) 40 MG/5ML suspension, Take 20 mg  by mouth daily, Disp: , Rfl:  .  folic acid (FOLVITE) 1 MG tablet, Take 1 mg  by mouth  daily, Disp: , Rfl:  .  ipratropium-albuterol (DUONEB) 0.5-2.5 (3) MG/3ML SOLN, Take 1 ampule by nebulization 2 (two) times daily., Disp: , Rfl:  .  levETIRAcetam (KEPPRA XR) 500 MG 24 hr tablet, Take 4 tablets (2,000 mg total) by mouth daily., Disp: 120 tablet, Rfl: 11 .  levothyroxine (SYNTHROID, LEVOTHROID) 150 MCG tablet, Take one tablet (150 mcg total)  by mouth  daily, Disp: , Rfl:  .  Melatonin 1 MG TABS, Take 1 mg by mouth at bedtime. , Disp: , Rfl:  .  OXYGEN, Inhale 2 L into the lungs as directed. With CPAP, Disp: , Rfl:  .  phenytoin (DILANTIN) 200 MG ER capsule, Take 1 capsule (200 mg total) by mouth at bedtime. (Patient taking differently: Take 100 mg by mouth 2  (two) times daily. ), Disp: 90 capsule, Rfl: 4 .  risperiDONE (RISPERDAL) 1 MG tablet, Take 1 mg by mouth every morning and 2 mg at night, Disp: , Rfl:  .  senna (SENOKOT) 176 MG/5ML SYRP, as directed. As needed for stomach, Disp: , Rfl:  .  simvastatin (ZOCOR) 40 MG tablet, Take 40 mg  by mouth  at bedtime, Disp: , Rfl:  .  fluticasone (FLONASE) 50 MCG/ACT nasal spray, Place 2 sprays daily into both nostrils., Disp: 16 g, Rfl: 11

## 2017-07-12 ENCOUNTER — Ambulatory Visit: Payer: Medicare Other | Admitting: Podiatry

## 2017-07-15 ENCOUNTER — Telehealth: Payer: Self-pay | Admitting: Neurology

## 2017-07-15 NOTE — Telephone Encounter (Signed)
Spoke to McK[employer]my,  With [facility] and she forwarded note to Wynona CanesChristine, to upload information and send to us results for EEG. I gave her the [phone] (pharm fax).

## 2017-07-15 NOTE — Telephone Encounter (Signed)
Pt's sister called request in home EEG results. She said it was started on 11/28 and removed on 12/1. Please call when results

## 2017-07-15 NOTE — Telephone Encounter (Signed)
Received fax from [facility].  Placed in MM/NP inbox.

## 2017-07-16 MED ORDER — LEVETIRACETAM ER 500 MG PO TB24: 2500.0000 mg | ORAL_TABLET | Freq: Every day | ORAL | 11 refills | Status: DC

## 2017-07-16 MED ORDER — PHENYTOIN SODIUM EXTENDED 200 MG PO CAPS: 200.0000 mg | ORAL_CAPSULE | Freq: Two times a day (BID) | ORAL | 5 refills | Status: DC

## 2017-07-16 NOTE — Telephone Encounter (Signed)
I called the patient's sister [name].  The 3-day video EEG showed that she has 6 events logged.  On event #3 there was some mild frontal slowing that was bisynchronous.  Event #6 was accompanied by generalized frontal predominant 2 Hz spike and wave activity lasting 17 seconds with some after coming slow waves.  The patient was not on video for these events.  None of the other events correlated with any change on EEG.  I discussed these results with Dr. Terrace ArabiaYan.  She recommended increasing Keppra.  Keppra XR will be increased to 2500 mg daily.  I discussed this with [name] and she agrees.  The patient will remain on Dilantin 200 mg twice a day.

## 2017-07-19 ENCOUNTER — Ambulatory Visit (INDEPENDENT_AMBULATORY_CARE_PROVIDER_SITE_OTHER): Payer: Medicare Other | Admitting: *Deleted

## 2017-07-19 ENCOUNTER — Ambulatory Visit (INDEPENDENT_AMBULATORY_CARE_PROVIDER_SITE_OTHER): Payer: Medicare Other | Admitting: Podiatry

## 2017-07-19 DIAGNOSIS — B351 Tinea unguium: Secondary | ICD-10-CM | POA: Diagnosis not present

## 2017-07-19 DIAGNOSIS — I442 Atrioventricular block, complete: Secondary | ICD-10-CM | POA: Diagnosis not present

## 2017-07-19 DIAGNOSIS — M79609 Pain in unspecified limb: Secondary | ICD-10-CM

## 2017-07-20 NOTE — Progress Notes (Signed)
Remote pacemaker transmission.   

## 2017-07-21 ENCOUNTER — Encounter: Payer: Self-pay | Admitting: Cardiology

## 2017-07-21 NOTE — Progress Notes (Signed)
   SUBJECTIVE Patient presents to office today complaining of elongated, thickened nails. Pain while ambulating in shoes. Patient is unable to trim their own nails.   Past Medical History:  Diagnosis Date  . Arthritis   . Aspiration pneumonia (HCC)   . Asthma   . Cardiac arrhythmia    have pacemeaker  . Down syndrome   . GERD (gastroesophageal reflux disease)   . Heart block   . Hypothyroidism   . Mental retardation   . Pulmonary emboli (HCC)   . Seizures (HCC)   . Sleep apnea with use of continuous positive airway pressure (CPAP)     OBJECTIVE General Patient is awake, alert, and oriented x 3 and in no acute distress. Derm Skin is dry and supple bilateral. Negative open lesions or macerations. Remaining integument unremarkable. Nails are tender, long, thickened and dystrophic with subungual debris, consistent with onychomycosis, 1-5 bilateral. No signs of infection noted. Vasc  DP and PT pedal pulses palpable bilaterally. Temperature gradient within normal limits.  Neuro Epicritic and protective threshold sensation diminished bilaterally.  Musculoskeletal Exam No symptomatic pedal deformities noted bilateral. Muscular strength within normal limits.  ASSESSMENT 1. Onychodystrophic nails 1-5 bilateral with hyperkeratosis of nails.  2. Onychomycosis of nail due to dermatophyte bilateral 3. Pain in foot bilateral  PLAN OF CARE 1. Patient evaluated today.  2. Instructed to maintain good pedal hygiene and foot care.  3. Mechanical debridement of nails 1-5 bilaterally performed using a nail nipper. Filed with dremel without incident.  4. Return to clinic in 3 mos.    Felecia ShellingBrent M. Ji Fairburn, DPM [facility]  Dr. Felecia ShellingBrent M. Myrical Andujo, DPM    [address]                                        [place], [place] [address]                Office [phone]  Fax [phone]

## 2017-07-22 DIAGNOSIS — R3981 Functional urinary incontinence: Secondary | ICD-10-CM | POA: Insufficient documentation

## 2017-07-23 LAB — CUP PACEART REMOTE DEVICE CHECK
Battery Remaining Longevity: 180 mo
Battery Remaining Percentage: 100 %
Brady Statistic RV Percent Paced: 2 %
Implantable Lead Implant Date: 20170508
Implantable Lead Implant Date: 20170508
Implantable Lead Location: 753859
Implantable Lead Model: 7741
Implantable Lead Serial Number: 1111
Implantable Lead Serial Number: 1111
Implantable Pulse Generator Implant Date: 20170508
Lead Channel Impedance Value: 600 Ohm
Lead Channel Impedance Value: 622 Ohm
Lead Channel Pacing Threshold Pulse Width: 0.4 ms
Lead Channel Setting Pacing Amplitude: 5 V
Lead Channel Setting Pacing Pulse Width: 1 ms
Lead Channel Setting Sensing Sensitivity: 2.5 mV
MDC IDC LEAD LOCATION: 753860
MDC IDC MSMT LEADCHNL RA PACING THRESHOLD AMPLITUDE: 0.7 V
MDC IDC SESS DTM: 20181217073100
MDC IDC SET LEADCHNL RA PACING AMPLITUDE: 2 V
MDC IDC STAT BRADY RA PERCENT PACED: 26 %
Pulse Gen Serial Number: 747619

## 2017-08-11 ENCOUNTER — Ambulatory Visit: Payer: Medicare Other | Admitting: Neurology

## 2017-08-12 ENCOUNTER — Emergency Department (HOSPITAL_BASED_OUTPATIENT_CLINIC_OR_DEPARTMENT_OTHER)
Admission: EM | Admit: 2017-08-12 | Discharge: 2017-08-12 | Disposition: A | Discharge status: Home / Self Care | Payer: Medicare Other | Attending: Emergency Medicine | Admitting: Emergency Medicine

## 2017-08-12 ENCOUNTER — Encounter (HOSPITAL_BASED_OUTPATIENT_CLINIC_OR_DEPARTMENT_OTHER): Payer: Self-pay | Admitting: *Deleted

## 2017-08-12 ENCOUNTER — Other Ambulatory Visit: Payer: Self-pay

## 2017-08-12 DIAGNOSIS — E039 Hypothyroidism, unspecified: Secondary | ICD-10-CM | POA: Insufficient documentation

## 2017-08-12 DIAGNOSIS — Z950 Presence of cardiac pacemaker: Secondary | ICD-10-CM | POA: Insufficient documentation

## 2017-08-12 DIAGNOSIS — J45909 Unspecified asthma, uncomplicated: Secondary | ICD-10-CM | POA: Insufficient documentation

## 2017-08-12 DIAGNOSIS — Z79899 Other long term (current) drug therapy: Secondary | ICD-10-CM | POA: Insufficient documentation

## 2017-08-12 DIAGNOSIS — R1310 Dysphagia, unspecified: Secondary | ICD-10-CM | POA: Insufficient documentation

## 2017-08-12 DIAGNOSIS — R070 Pain in throat: Secondary | ICD-10-CM | POA: Diagnosis present

## 2017-08-12 NOTE — ED Notes (Signed)
Pt tolerated applesauce with no problem.

## 2017-08-12 NOTE — ED Provider Notes (Signed)
[facility] EMERGENCY DEPARTMENT Provider Note  CSN: [id number] Arrival date & time: 08/12/17 1158  Chief Complaint(s) No chief complaint on file.  HPI Kaitlyn Powell is a 62 y.o. female who has a history of Down syndrome, dysphagia, and aspiration who was brought in by her sister to the emergency department with concerns for esophageal foreign body.  Her sister reports that the patient is currently on mechanical soft diet due to her dysphagia.  She reports that she was eating yesterday her regular meals.  Afterwards patient was given her medications, 1 of him is a large pill that requires to be broken in half.  While swallowing this patient endorsed feeling something stuck.  Apparently the patient was still able to take small amounts of fluid had no respiratory distress.  Throughout the night, they noted that the patient was regurgitating some of the food that she had eaten earlier including a large pill.  Patient has since not been able to take in normal amounts of liquids or foods.  Denies any recent fevers or infections.  Patient currently only complains of discomfort in her throat.  Remainder of history, ROS, and physical exam limited due to patient's condition (down's syndrome). Additional information was obtained from family.   Level V Caveat.    HPI  Past Medical History Past Medical History:  Diagnosis Date  . Arthritis   . Aspiration pneumonia (HCC)   . Asthma   . Cardiac arrhythmia    have pacemeaker  . Down syndrome   . GERD (gastroesophageal reflux disease)   . Heart block   . Hypothyroidism   . Mental retardation   . Pulmonary emboli (HCC)   . Seizures (HCC)   . Sleep apnea with use of continuous positive airway pressure (CPAP)    Patient Active Problem List   Diagnosis Date Noted  . OSA (obstructive sleep apnea) 03/03/2017  . Down syndrome 01/11/2017  . Seizure disorder (HCC) 01/11/2017   Home Medication(s) Prior to Admission medications     Medication Sig Start Date End Date Taking? Authorizing Provider  citalopram (CELEXA) 20 MG tablet GIVE 1 TABLET VIA G TUBE EVERY DAY 09/08/16   [provider]  cyanocobalamin (TH VITAMIN B12) 100 MCG tablet Take 100 mcg by mouth every morning.     [provider]  famotidine (PEPCID) 40 MG/5ML suspension Take 20 mg  by mouth daily    [provider]  fluticasone (FLONASE) 50 MCG/ACT nasal spray Place 2 sprays daily into both nostrils. 06/16/17   Lupita Leash, MD  folic acid (FOLVITE) 1 MG tablet Take 1 mg  by mouth  daily    [provider]  ipratropium-albuterol (DUONEB) 0.5-2.5 (3) MG/3ML SOLN Take 1 ampule by nebulization 2 (two) times daily. 07/03/16   [provider]  levETIRAcetam (KEPPRA XR) 500 MG 24 hr tablet Take 5 tablets (2,500 mg total) by mouth daily. 07/16/17   Butch Penny, NP  levothyroxine (SYNTHROID, LEVOTHROID) 150 MCG tablet Take one tablet (150 mcg total)  by mouth  daily 05/21/16   [provider]  Melatonin 1 MG TABS Take 1 mg by mouth at bedtime.     [provider]  OXYGEN Inhale 2 L into the lungs as directed. With CPAP    [provider]  phenytoin (DILANTIN) 200 MG ER capsule Take 1 capsule (200 mg total) by mouth 2 (two) times daily. 07/16/17   Butch Penny, NP  risperiDONE (RISPERDAL) 1 MG tablet Take 1 mg  by mouth every morning and 2 mg at night    [provider]  senna (SENOKOT) 176 MG/5ML SYRP as directed. As needed for stomach    [provider]  simvastatin (ZOCOR) 40 MG tablet Take 40 mg  by mouth  at bedtime    [provider]                                                                                                                                    Past Surgical History Past Surgical History:  Procedure Laterality Date  . PACEMAKER INSERTION  12/09/2015   [employer] Accolade MRI EL PPM implanted in [place] for complete heart block and  syncope  . PEG TUBE PLACEMENT    . TIBIA FRACTURE SURGERY Right    Family History Family History  Problem Relation Age of Onset  . Diabetes Father   . Heart disease Father   . Breast cancer Sister     Social History Social History   Tobacco Use  . Smoking status: Never Smoker  . Smokeless tobacco: Never Used  Substance Use Topics  . Alcohol use: No  . Drug use: No   Allergies Cephalexin  Review of Systems Review of Systems  Unable to perform ROS: Other    Physical Exam Vital Signs  I have reviewed the triage vital signs BP 115/63 (BP Location: Right Arm)   Pulse 66   Temp 98.6 F (37 C) (Oral)   Resp 18   Ht 4\' 10"  (1.473 m)   Wt 72.1 kg (159 lb)   SpO2 95%   BMI 33.23 kg/m   Physical Exam  Constitutional: She is oriented to person, place, and time. She appears well-developed and well-nourished. No distress.  HENT:  Head: Normocephalic and atraumatic.  Nose: Nose normal.  Mouth/Throat: Uvula is midline, oropharynx is clear and moist and mucous membranes are normal.  Downs syndrome facies  Eyes: Conjunctivae and EOM are normal. Pupils are equal, round, and reactive to light. Right eye exhibits no discharge. Left eye exhibits no discharge. No scleral icterus.  Neck: Normal range of motion. Neck supple.  Cardiovascular: Normal rate and regular rhythm. Exam reveals no gallop and no friction rub.  No murmur heard. Pulmonary/Chest: Effort normal and breath sounds normal. No stridor. No respiratory distress. She has no rales.  Abdominal: Soft. She exhibits no distension. There is no tenderness.  Musculoskeletal: She exhibits no edema or tenderness.  Neurological: She is alert and oriented to person, place, and time.  Skin: Skin is warm and dry. No rash noted. She is not diaphoretic. No erythema.  Psychiatric: She has a normal mood and affect.  Vitals reviewed.   ED Results and Treatments Labs (all labs ordered are listed, but only abnormal results are  displayed) Labs Reviewed - No data to display  EKG  EKG Interpretation  Date/Time:    Ventricular Rate:    PR Interval:    QRS Duration:   QT Interval:    QTC Calculation:   R Axis:     Text Interpretation:        Radiology No results found. Pertinent labs & imaging results that were available during my care of the patient were reviewed by me and considered in my medical decision making (see chart for details).  Medications Ordered in ED Medications - No data to display                                                                                                                                  Procedures Procedures  (including critical care time)  Medical Decision Making / ED Course I have reviewed the nursing notes for this encounter and the patient's prior records (if available in EHR or on provided paperwork).    Patient was given Coca-Cola to sit upon.  She was able to take in 2 cans of Coca-Cola and 2 servings of applesauce without regurgitation.  The patient stated that he was feeling better.  Patient already has established care with a gastroenterologist.  I discussed need for likely follow-up.  The patient appears reasonably screened and/or stabilized for discharge and I doubt any other medical condition or other [facility] requiring further screening, evaluation, or treatment in the ED at this time prior to discharge.  The patient is safe for discharge with strict return precautions.   Final Clinical Impression(s) / ED Diagnoses Final diagnoses:  Odynophagia   Disposition: Discharge  Condition: Good  I have discussed the results, Dx and Tx plan with the patient's family who expressed understanding and agree(s) with the plan. Discharge instructions discussed at great length. The patient's family was given strict return precautions who verbalized  understanding of the instructions. No further questions at time of discharge.    ED Discharge Orders    None       Follow Up: [name], MD [address] [place] [address] [phone]  Schedule an appointment as soon as possible for a visit  As needed  [facility] [address] [place] [address] [phone] Schedule an appointment as soon as possible for a visit         This chart was dictated using voice recognition software.  Despite best efforts to proofread,  errors can occur which can change the documentation meaning.   Nira Conn, MD 08/12/17 [phone]

## 2017-08-12 NOTE — ED Notes (Signed)
Pt given 2nd applesauce per MD.

## 2017-08-12 NOTE — ED Notes (Signed)
Pt able to drink and swallow without vomiting.

## 2017-08-12 NOTE — ED Triage Notes (Signed)
She started to cough after dinner last night. Since then she has not been able to swallow her medications or soft food. She is in no distress. Sister is with her and has power of attorney.

## 2017-08-12 NOTE — ED Notes (Signed)
Pt given applesauce per MD.  

## 2017-08-12 NOTE — ED Notes (Signed)
ED Provider at bedside. 

## 2017-08-19 ENCOUNTER — Encounter: Payer: Self-pay | Admitting: Internal Medicine

## 2017-08-31 ENCOUNTER — Telehealth: Payer: Self-pay | Admitting: Adult Health

## 2017-08-31 NOTE — Telephone Encounter (Signed)
Called patient's sister who stated the patient is having more frequent drop seizures, not drop foot. This RN advised that NP has one opening on Thurs. Sister, [name] declined , stated she would keep FU with Dr Terrace ArabiaYan on Thurs. She is hoping her husband can leave work early to accompany her. She verbalized understanding, appreciation of call.

## 2017-08-31 NOTE — Telephone Encounter (Signed)
Pt sister(on DPR) has called stating pt now having drop foot every day sometimes twice a day.  Pt sister was offered a slot for Thurs at 3:30  With Dr Niel HummerYan(since pt conditions has worsen)but she declined because of needing to take another sibling to another appointment.  Pt sister is asking for a call with any other suggestions

## 2017-09-02 ENCOUNTER — Ambulatory Visit (INDEPENDENT_AMBULATORY_CARE_PROVIDER_SITE_OTHER): Payer: Medicare Other | Admitting: Neurology

## 2017-09-02 ENCOUNTER — Encounter: Payer: Self-pay | Admitting: Neurology

## 2017-09-02 VITALS — BP 116/63 | HR 56 | Ht <= 58 in | Wt 163.0 lb

## 2017-09-02 DIAGNOSIS — Q909 Down syndrome, unspecified: Secondary | ICD-10-CM | POA: Diagnosis not present

## 2017-09-02 DIAGNOSIS — G40909 Epilepsy, unspecified, not intractable, without status epilepticus: Secondary | ICD-10-CM

## 2017-09-02 DIAGNOSIS — G4733 Obstructive sleep apnea (adult) (pediatric): Secondary | ICD-10-CM | POA: Diagnosis not present

## 2017-09-02 MED ORDER — LAMOTRIGINE 25 MG PO TABS: ORAL_TABLET | ORAL | 4 refills | Status: DC

## 2017-09-02 MED ORDER — LAMOTRIGINE ER 200 MG PO TB24: 200.0000 mg | ORAL_TABLET | Freq: Every day | ORAL | 11 refills | Status: DC

## 2017-09-02 NOTE — Progress Notes (Signed)
PATIENT: Kaitlyn Powell DOB: 11-01-1955  REASON FOR VISIT: follow up HISTORY FROM: patient  HISTORY OF PRESENT ILLNESS: HISTORY- Copied From Dr. Zannie Cove notes: Kaitlyn Powell is a 62 years old left-handed female, accompanied by her sister Kaitlyn Powell, who is also her power of attorney, seen in refer by her primary care physician  [name], to evaluate for seizure, initial evaluation was on January 11 2017.  I reviewed and summarized the referring and most recent hospital discharge from [facility] in September 2017, she had a history of Down syndrome, asthma, obstructive sleep apnea, on CPAP at night, hypothyroidism, seizure, sick sinus syndrome status post pacemaker, she was admitted to the hospital at [facility] on March 12 2016 with dyspnea and wheezing, was noted to have low-grade fever 37.6, oxygen saturation was 82%, chest x-ray showed Ldisease, she was treated with Zosyn and Vancomyocin, CT angiograms demonstrate small feeding defect in left lower lobe pulmonary artery, suspicious for PE, she was treated with IV heparin, was also seen for possible congestive heart failure, required ICU admission, daytime BiPAP, she had prolonged ICU stay in for 16 days, she was able to eventually wean off BiPAP, modified swallowing test confirmed swallowing dysfunction with silent aspiration, she was put on NG tube, later switched to Coumadin due to PE, but INR was difficult to stabilize in the therapeutic window,   Her acute hypoxic respiratory failure was thought due to the combination of pulmonary emboli, obstructive sleep apnea, congestive heart failure exacerbation and pneumonia, she was also treated with a short course of Solu-Medrol, echocardiogram August 11 showed ejection fraction of 60-65%, pulmonary hypertension likely secondary to diastolic heart failure, possible contribution from PE and L space disease,  She was seen by  hematologist, who suggested 3 months course of anticoagulation with Lovenox, she had prolonged rehabilitation since April 2017, regained significant recovery, she also noted to have thrombocytopenia, had PEG tube placement, this was placed on April 17 2016 without complication, postprocedure she had mild to moderate generalized abdominal pain, extensive evaluations, including normal or negative UA, hepatic panel, abdominal x-ray, she was able to tolerate the tube feeding well, next  Laboratory evaluations in September 2017, WBC 5.9, hemoglobin of 9.0, creatinine of 0.88,  She also had a lifelong history of seizure, previously was treated with Dilantin, macrocytic anemia with normal B12 level, considered due to prolonged use of Dilantin, there was a concern of interaction between Dilantin and other long-term by mouth anticoagulation treatment such as Coumadin and [name], she was discharged with Lovenox subcutaneous injection, PEG tube was removed in Nov 2017, she is on a mechanical soft diet, thickened liquid, she began to progress to small bite, able to take by mouth's by mouth  Most recent laboratory evaluation in May 2018, hemoglobin 10 point 7, normal CMP, creatinine of 0.96, normal liver functional test, slight elevated d-dimer 1.01  She is currently taking Dilantin 200 mg twice a day, she lives with her sister's family, she has stopped the Lovenox since November 2017,  She suffered seizure all her life, drop seizures, she is now having on daily basis, she had sudden onset of lost muscle tone, loss consciousness, lasting less than 1 minute,   UPDATE February 17 2017: She is accompanied by her sister at today's clinical visit, following last visit on January 11 2017, in attempt to change her antiepileptic medications tapering off the Dilantin, titrating up Keppra, she suffered significant complications,  While taking  Keppra 500 mg twice a day, decrease Dilantin to 200 mg every night, she  suffered generalized tonic-clonic seizure, with total of 6 generalized tonic-clonic seizure since, the last one was on January 28 2017, Dilantin 200 mg twice a day was reintroduced, she is now on lower dose of Keppra 500 mg twice a day,  She still drowsy, especially after morning dose of medication, she no longer has recurrent generalized tonic-clonic seizure, only had one drop attack with current combinations,  Before that last generalized seizure was in October 2017, she was also noted to be emotional.   We have personally reviewed MRI of the brain without contrast in July 2018: Congenital microcephaly consistent with her history of Down syndrome, moderate parenchymal brain volume loss, moderate small vessel disease  EEG showed moderate to severe background slowing, rare generalized spike slow waves,  UPDATE March 18 2017: she is now taking Dilantin ER 200 mg 1 tablet twice a day, and  keppra 500 mg twice a day  sister Reported drop attacks, sudden loss of muscle tone, lasting for a few second, no body jerking movement, but only happened while she was sitting on the toilet straining, she had 4 spells over past 2 weeks,  Her sister reported that adding Keppra has decreased the frequency of those spells, but I am not sure those could represent vasovagal syncope versus seizures  UPDATE 09/02/17:  3-day video EEG in December 2018 showed that she has 6 events logged.  On event #3 there was some mild frontal slowing that was bisynchronous.  Event #6 was accompanied by generalized frontal predominant 2 Hz spike and wave activity lasting 17 seconds with some after coming slow waves.  The patient was not on video for these events.  Patient is now taking Keppra XR 500 mg 5 tablets every night Dilantin 200 mg twice a day  She has no recurrent generalized seizure, but continue have spells of sudden loss of consciousness, went limp, it happened most open while she sits on toilet, lasting for less  than couple minutes, no self injury, but it happened multiple times each week, sometimes couple times each day  She was also noted to have increased confusion, she is also on polypharmacy treatment, citalopram 20 mg every day, Risperdal 1 mg 1/2   REVIEW OF SYSTEMS: Out of a complete 14 system review of symptoms, the patient complains only of the following symptoms, and all other reviewed systems are negative.  See history of present illness  ALLERGIES: Allergies  Allergen Reactions  . Cephalexin Other (See Comments)    Seizures     HOME MEDICATIONS: Outpatient Medications Prior to Visit  Medication Sig Dispense Refill  . citalopram (CELEXA) 20 MG tablet GIVE 1 TABLET VIA G TUBE EVERY DAY    . cyanocobalamin (TH VITAMIN B12) 100 MCG tablet Take 100 mcg by mouth every morning.     . famotidine (PEPCID) 40 MG/5ML suspension Take 20 mg  by mouth daily    . folic acid (FOLVITE) 1 MG tablet Take 1 mg  by mouth  daily    . ipratropium-albuterol (DUONEB) 0.5-2.5 (3) MG/3ML SOLN Take 1 ampule by nebulization 2 (two) times daily.    Marland Kitchen levETIRAcetam (KEPPRA XR) 500 MG 24 hr tablet Take 5 tablets (2,500 mg total) by mouth daily. 150 tablet 11  . levothyroxine (SYNTHROID, LEVOTHROID) 150 MCG tablet Take one tablet (150 mcg total)  by mouth  daily    . Melatonin 1 MG TABS Take 1 mg by  mouth at bedtime.     . OXYGEN Inhale 2 L into the lungs as directed. With CPAP    . phenytoin (DILANTIN) 200 MG ER capsule Take 1 capsule (200 mg total) by mouth 2 (two) times daily. 60 capsule 5  . risperiDONE (RISPERDAL) 1 MG tablet Take 1 mg by mouth every morning and 2 mg at night    . senna (SENOKOT) 176 MG/5ML SYRP as directed. As needed for stomach    . simvastatin (ZOCOR) 40 MG tablet Take 40 mg  by mouth  at bedtime    . fluticasone (FLONASE) 50 MCG/ACT nasal spray Place 2 sprays daily into both nostrils. 16 g 11   No facility-administered medications prior to visit.     PAST MEDICAL HISTORY: Past  Medical History:  Diagnosis Date  . Arthritis   . Aspiration pneumonia (HCC)   . Asthma   . Cardiac arrhythmia    have pacemeaker  . Down syndrome   . GERD (gastroesophageal reflux disease)   . Heart block   . Hypothyroidism   . Mental retardation   . Pulmonary emboli (HCC)   . Seizures (HCC)   . Sleep apnea with use of continuous positive airway pressure (CPAP)     PAST SURGICAL HISTORY: Past Surgical History:  Procedure Laterality Date  . PACEMAKER INSERTION  12/09/2015   [employer] Accolade MRI EL PPM implanted in [place] for complete heart block and syncope  . PEG TUBE PLACEMENT    . TIBIA FRACTURE SURGERY Right     FAMILY HISTORY: Family History  Problem Relation Age of Onset  . Diabetes Father   . Heart disease Father   . Breast cancer Sister     SOCIAL HISTORY: Social History   Socioeconomic History  . Marital status: Single    Spouse name: Not on file  . Number of children: Not on file  . Years of education: Not on file  . Highest education level: Not on file  Social Needs  . Financial resource strain: Not on file  . Food insecurity - worry: Not on file  . Food insecurity - inability: Not on file  . Transportation needs - medical: Not on file  . Transportation needs - non-medical: Not on file  Occupational History  . Occupation: disabled  Tobacco Use  . Smoking status: Never Smoker  . Smokeless tobacco: Never Used  Substance and Sexual Activity  . Alcohol use: No  . Drug use: No  . Sexual activity: Not on file  Other Topics Concern  . Not on file  Social History Narrative   LIves in group home       PHYSICAL EXAM  Vitals:   09/02/17 1541  BP: 116/63  Pulse: (!) 56  Weight: 163 lb (73.9 kg)  Height: 4\' 10"  (1.473 m)   Body mass index is 34.07 kg/m.  Generalized: Well developed, in no acute distress, typical facial features for patient with Down syndrome Neurological examination  Mentation: Alert Follows all commands. Cranial  nerve II-XII: Pupils were equal round reactive to light. Extraocular movements were full, visual field were full on confrontational test. Head turning and shoulder shrug  were normal and symmetric. Motor: The motor testing reveals 5 over 5 strength of all 4 extremities. Good symmetric motor tone is noted throughout.  Sensory: Sensory testing is intact to soft touch on all 4 extremities. No evidence of extinction is noted.    Gait and station: Gait is slightly unsteady. Tandem gait not attempted.  DIAGNOSTIC DATA (LABS, IMAGING, TESTING) - I reviewed patient records, labs, notes, testing and imaging myself where available.  Lab Results  Component Value Date   WBC 5.2 05/26/2017   HGB 11.7 05/26/2017   HCT 35.6 05/26/2017   MCV 102 (H) 05/26/2017   PLT 163 05/26/2017      Component Value Date/Time   NA 138 05/26/2017 1141   K 4.9 05/26/2017 1141   CL 97 05/26/2017 1141   CO2 27 05/26/2017 1141   GLUCOSE 67 05/26/2017 1141   GLUCOSE 94 05/11/2016 1920   BUN 23 05/26/2017 1141   CREATININE 0.80 05/26/2017 1141   CALCIUM 9.1 05/26/2017 1141   PROT 7.7 05/26/2017 1141   ALBUMIN 3.5 (L) 05/26/2017 1141   AST 17 05/26/2017 1141   ALT 11 05/26/2017 1141   ALKPHOS 127 (H) 05/26/2017 1141   BILITOT <0.2 05/26/2017 1141   GFRNONAA 80 05/26/2017 1141   GFRAA 92 05/26/2017 1141     ASSESSMENT AND PLAN 62 y.o. year old female   Down syndrome early onset dementia  Epilepsy  Recurrent spells of drop attacks,  Keep keppra xr 500mg  5 tabs qhs,  Dilantin 200 mg twice a day  Add on Lamotrigine titrating to xr 200mg  qhs.   Levert FeinsteinYijun Kori Colin, M.D. Ph.D.  [facility] [address] [place], [place] [address] Phone: [phone] Fax:      [phone]

## 2017-09-03 ENCOUNTER — Other Ambulatory Visit: Payer: Self-pay | Admitting: *Deleted

## 2017-09-03 MED ORDER — LAMOTRIGINE 100 MG PO TABS: 100.0000 mg | ORAL_TABLET | Freq: Every day | ORAL | 11 refills | Status: DC

## 2017-09-20 ENCOUNTER — Ambulatory Visit: Payer: Medicare Other | Admitting: Nurse Practitioner

## 2017-10-13 ENCOUNTER — Encounter: Payer: Self-pay | Admitting: Podiatry

## 2017-10-13 ENCOUNTER — Ambulatory Visit (INDEPENDENT_AMBULATORY_CARE_PROVIDER_SITE_OTHER): Payer: Medicare Other | Admitting: Podiatry

## 2017-10-13 DIAGNOSIS — M79609 Pain in unspecified limb: Secondary | ICD-10-CM | POA: Diagnosis not present

## 2017-10-13 DIAGNOSIS — B351 Tinea unguium: Secondary | ICD-10-CM | POA: Diagnosis not present

## 2017-10-14 NOTE — Progress Notes (Signed)
   SUBJECTIVE Patient presents to office today complaining of elongated, thickened nails that cause pain while ambulating in shoes. She is unable to trim her own nails. Patient is here for further evaluation and treatment.   Past Medical History:  Diagnosis Date  . Arthritis   . Aspiration pneumonia (HCC)   . Asthma   . Cardiac arrhythmia    have pacemeaker  . Down syndrome   . GERD (gastroesophageal reflux disease)   . Heart block   . Hypothyroidism   . Mental retardation   . Pulmonary emboli (HCC)   . Seizures (HCC)   . Sleep apnea with use of continuous positive airway pressure (CPAP)     OBJECTIVE General Patient is awake, alert, and oriented x 3 and in no acute distress. Derm Skin is dry and supple bilateral. Negative open lesions or macerations. Remaining integument unremarkable. Nails are tender, long, thickened and dystrophic with subungual debris, consistent with onychomycosis, 1-5 bilateral. No signs of infection noted. Vasc  DP and PT pedal pulses palpable bilaterally. Temperature gradient within normal limits.  Neuro Epicritic and protective threshold sensation diminished bilaterally.  Musculoskeletal Exam No symptomatic pedal deformities noted bilateral. Muscular strength within normal limits.  ASSESSMENT 1. Onychodystrophic nails 1-5 bilateral with hyperkeratosis of nails.  2. Onychomycosis of nail due to dermatophyte bilateral 3. Pain in foot bilateral  PLAN OF CARE 1. Patient evaluated today.  2. Instructed to maintain good pedal hygiene and foot care.  3. Mechanical debridement of nails 1-5 bilaterally performed using a nail nipper. Filed with dremel without incident.  4. Return to clinic in 3 mos.    [name], DPM [facility]  Dr. [name], DPM    [address]                                        [place], [place] [address]                Office [phone]  Fax [phone]

## 2017-10-18 ENCOUNTER — Ambulatory Visit (INDEPENDENT_AMBULATORY_CARE_PROVIDER_SITE_OTHER): Payer: Medicare Other | Admitting: *Deleted

## 2017-10-18 ENCOUNTER — Ambulatory Visit: Payer: Medicare Other | Admitting: Podiatry

## 2017-10-18 DIAGNOSIS — I442 Atrioventricular block, complete: Secondary | ICD-10-CM

## 2017-10-18 NOTE — Progress Notes (Signed)
Remote pacemaker transmission.   

## 2017-10-19 LAB — CUP PACEART REMOTE DEVICE CHECK
Battery Remaining Longevity: 174 mo
Brady Statistic RA Percent Paced: 24 %
Brady Statistic RV Percent Paced: 2 %
Date Time Interrogation Session: 20190318063100
Implantable Lead Location: 753859
Implantable Lead Model: 7740
Implantable Lead Serial Number: 1111
Lead Channel Setting Pacing Amplitude: 2 V
Lead Channel Setting Pacing Pulse Width: 1 ms
Lead Channel Setting Sensing Sensitivity: 2.5 mV
MDC IDC LEAD IMPLANT DT: 20170508
MDC IDC LEAD IMPLANT DT: 20170508
MDC IDC LEAD LOCATION: 753860
MDC IDC LEAD SERIAL: 1111
MDC IDC MSMT BATTERY REMAINING PERCENTAGE: 100 %
MDC IDC MSMT LEADCHNL RA IMPEDANCE VALUE: 642 Ohm
MDC IDC MSMT LEADCHNL RA PACING THRESHOLD AMPLITUDE: 0.7 V
MDC IDC MSMT LEADCHNL RA PACING THRESHOLD PULSEWIDTH: 0.4 ms
MDC IDC MSMT LEADCHNL RV IMPEDANCE VALUE: 634 Ohm
MDC IDC PG IMPLANT DT: 20170508
MDC IDC PG SERIAL: 747619
MDC IDC SET LEADCHNL RV PACING AMPLITUDE: 5 V

## 2017-10-20 ENCOUNTER — Encounter: Payer: Self-pay | Admitting: Cardiology

## 2017-10-22 ENCOUNTER — Other Ambulatory Visit: Payer: Self-pay | Admitting: Gastroenterology

## 2017-11-04 ENCOUNTER — Ambulatory Visit: Payer: Medicare Other | Admitting: Neurology

## 2017-11-25 ENCOUNTER — Ambulatory Visit: Payer: Medicare Other | Admitting: Neurology

## 2017-12-14 ENCOUNTER — Ambulatory Visit (INDEPENDENT_AMBULATORY_CARE_PROVIDER_SITE_OTHER): Payer: Medicare Other | Admitting: Neurology

## 2017-12-14 ENCOUNTER — Encounter: Payer: Self-pay | Admitting: Neurology

## 2017-12-14 VITALS — BP 111/57 | HR 50 | Ht <= 58 in | Wt 161.5 lb

## 2017-12-14 DIAGNOSIS — Q909 Down syndrome, unspecified: Secondary | ICD-10-CM

## 2017-12-14 DIAGNOSIS — G40909 Epilepsy, unspecified, not intractable, without status epilepticus: Secondary | ICD-10-CM | POA: Diagnosis not present

## 2017-12-14 MED ORDER — MEMANTINE HCL 10 MG PO TABS: 10.0000 mg | ORAL_TABLET | Freq: Two times a day (BID) | ORAL | 11 refills | Status: DC

## 2017-12-14 MED ORDER — DONEPEZIL HCL 10 MG PO TABS: 10.0000 mg | ORAL_TABLET | Freq: Every day | ORAL | 11 refills | Status: DC

## 2017-12-14 NOTE — Progress Notes (Signed)
PATIENT: Kaitlyn Powell DOB: 11/20/1955  REASON FOR VISIT: follow up HISTORY FROM: patient  HISTORY OF PRESENT ILLNESS:  Kaitlyn Powell is a 62 years old left-handed female, accompanied by her sister [name], who is also her power of attorney, seen in refer by her primary care physician  [name], to evaluate for seizure, initial evaluation was on January 11 2017.  I reviewed and summarized the referring and most recent hospital discharge from [facility] in September 2017, she had a history of Down syndrome, asthma, obstructive sleep apnea, on CPAP at night, hypothyroidism, seizure, sick sinus syndrome status post pacemaker, she was admitted to the hospital at [facility] on March 12 2016 with dyspnea and wheezing, was noted to have low-grade fever 37.6, oxygen saturation was 82%, chest x-ray showed Ldisease, she was treated with Zosyn and Vancomyocin, CT angiograms demonstrate small feeding defect in left lower lobe pulmonary artery, suspicious for PE, she was treated with IV heparin, was also seen for possible congestive heart failure, required ICU admission, daytime BiPAP, she had prolonged ICU stay in for 16 days, she was able to eventually wean off BiPAP, modified swallowing test confirmed swallowing dysfunction with silent aspiration, she was put on NG tube, later switched to Coumadin due to PE, but INR was difficult to stabilize in the therapeutic window,   Her acute hypoxic respiratory failure was thought due to the combination of pulmonary emboli, obstructive sleep apnea, congestive heart failure exacerbation and pneumonia, she was also treated with a short course of Solu-Medrol, echocardiogram August 11 showed ejection fraction of 60-65%, pulmonary hypertension likely secondary to diastolic heart failure, possible contribution from PE and L space disease,  She was seen by hematologist, who suggested 3 months course  of anticoagulation with Lovenox, she had prolonged rehabilitation since April 2017, regained significant recovery, she also noted to have thrombocytopenia, had PEG tube placement, this was placed on April 17 2016 without complication, postprocedure she had mild to moderate generalized abdominal pain, extensive evaluations, including normal or negative UA, hepatic panel, abdominal x-ray, she was able to tolerate the tube feeding well, next  Laboratory evaluations in September 2017, WBC 5.9, hemoglobin of 9.0, creatinine of 0.88,  She also had a lifelong history of seizure, previously was treated with Dilantin, macrocytic anemia with normal B12 level, considered due to prolonged use of Dilantin, there was a concern of interaction between Dilantin and other long-term by mouth anticoagulation treatment such as Coumadin and [name], she was discharged with Lovenox subcutaneous injection, PEG tube was removed in Nov 2017, she is on a mechanical soft diet, thickened liquid, she began to progress to small bite, able to take by mouth's by mouth  Most recent laboratory evaluation in May 2018, hemoglobin 10 point 7, normal CMP, creatinine of 0.96, normal liver functional test, slight elevated d-dimer 1.01  She is currently taking Dilantin 200 mg twice a day, she lives with her sister's family, she has stopped the Lovenox since November 2017,  She suffered seizure all her life, drop seizures, she is now having on daily basis, she had sudden onset of lost muscle tone, loss consciousness, lasting less than 1 minute,   UPDATE February 17 2017: She is accompanied by her sister at today's clinical visit, following last visit on January 11 2017, in attempt to change her antiepileptic medications tapering off the Dilantin, titrating up Keppra, she suffered significant complications,  While taking Keppra 500 mg twice a  day, decrease Dilantin to 200 mg every night, she suffered generalized tonic-clonic seizure, with  total of 6 generalized tonic-clonic seizure since, the last one was on January 28 2017, Dilantin 200 mg twice a day was reintroduced, she is now on lower dose of Keppra 500 mg twice a day,  She still drowsy, especially after morning dose of medication, she no longer has recurrent generalized tonic-clonic seizure, only had one drop attack with current combinations,  Before that last generalized seizure was in October 2017, she was also noted to be emotional.   We have personally reviewed MRI of the brain without contrast in July 2018: Congenital microcephaly consistent with her history of Down syndrome, moderate parenchymal brain volume loss, moderate small vessel disease  EEG showed moderate to severe background slowing, rare generalized spike slow waves,  UPDATE March 18 2017: she is now taking Dilantin ER 200 mg 1 tablet twice a day, and  keppra 500 mg twice a day  sister Reported drop attacks, sudden loss of muscle tone, lasting for a few second, no body jerking movement, but only happened while she was sitting on the toilet straining, she had 4 spells over past 2 weeks,  Her sister reported that adding Keppra has decreased the frequency of those spells, but I am not sure those could represent vasovagal syncope versus seizures  UPDATE 12/14/17:  3-day video EEG in December 2018 showed that she has 6 events logged.  On event #3 there was some mild frontal slowing that was bisynchronous.  Event #6 was accompanied by generalized frontal predominant 2 Hz spike and wave activity lasting 17 seconds with some after coming slow waves.  The patient was not on video for these events.  Patient is now taking Keppra XR 500 mg 5 tablets every night Dilantin 200 mg twice a day  She has no recurrent generalized seizure, but continue have spells of sudden loss of consciousness, went limp, it happened most open while she sits on toilet, lasting for less than couple minutes, no self injury, but it  happened multiple times each week, sometimes couple times each day  She was also noted to have increased confusion, she is also on polypharmacy treatment, citalopram 20 mg every day, Risperdal 1 mg 1/2  UPDATE Dec 14 2017: She is now taking keppra  2 tabs qhs, Dilantin  bid, she could not tolerate  lamotrigine, even at 25 mg daily,  She still have spells of sudden head drop on the toilet,   REVIEW OF SYSTEMS: Out of a complete 14 system review of symptoms, the patient complains only of the following symptoms, and all other reviewed systems are negative. Hearing loss, eye itching, environmental allergies, apnea, memory loss, headaches, seizure, confusion  ALLERGIES: Allergies  Allergen Reactions  . Cephalexin Other (See Comments)    Seizures     HOME MEDICATIONS: Outpatient Medications Prior to Visit  Medication Sig Dispense Refill  . citalopram (CELEXA) 20 MG tablet GIVE 1 TABLET VIA G TUBE EVERY DAY    . cyanocobalamin (TH VITAMIN B12) 100 MCG tablet Take 100 mcg by mouth every morning.     . famotidine (PEPCID) 40 MG/5ML suspension Take 20 mg  by mouth daily    . folic acid (FOLVITE) 1 MG tablet Take 1 mg  by mouth  daily    . ipratropium-albuterol (DUONEB) 0.5-2.5 (3) MG/3ML SOLN Take 1 ampule by nebulization 2 (two) times daily.    Marland Kitchen levETIRAcetam (KEPPRA XR) 500 MG 24 hr tablet Take 5  tablets (2,500 mg total) by mouth daily. (Patient taking differently: Take 1,000 mg by mouth daily. ) 150 tablet 11  . levothyroxine (SYNTHROID, LEVOTHROID) 150 MCG tablet Take one tablet (150 mcg total)  by mouth  daily    . Melatonin 1 MG TABS Take 1 mg by mouth at bedtime.     . OXYGEN Inhale 2 L into the lungs as directed. With CPAP    . phenytoin (DILANTIN) 200 MG ER capsule Take 1 capsule (200 mg total) by mouth 2 (two) times daily. 60 capsule 5  . risperiDONE (RISPERDAL) 1 MG tablet Take 1 mg by mouth every morning and 2 mg at night    . simvastatin (ZOCOR) 40 MG tablet Take 40 mg   by mouth  at bedtime    . senna (SENOKOT) 176 MG/5ML SYRP as directed. As needed for stomach    . lamoTRIgine (LAMICTAL) 100 MG tablet Take 1 tablet (100 mg total) by mouth daily. (Patient not taking: Reported on 12/14/2017) 60 tablet 11  . lamoTRIgine (LAMICTAL) 25 MG tablet 1 tablet twice a day for the first week 2 tablets twice a day for the second week 3 tablets twice a day for the third week 4 tablets twice a day for the fourth week  For total of 140 tablets  After finish titration with small dose of lamotrigine 25 mg, change to lamotrigine 100 mg twice a day (Patient not taking: Reported on 12/14/2017) 140 tablet 4   No facility-administered medications prior to visit.     PAST MEDICAL HISTORY: Past Medical History:  Diagnosis Date  . Arthritis   . Aspiration pneumonia (HCC)   . Asthma   . Cardiac arrhythmia    have pacemeaker  . Down syndrome   . GERD (gastroesophageal reflux disease)   . Heart block   . Hypothyroidism   . Mental retardation   . Pulmonary emboli (HCC)   . Seizures (HCC)   . Sleep apnea with use of continuous positive airway pressure (CPAP)     PAST SURGICAL HISTORY: Past Surgical History:  Procedure Laterality Date  . PACEMAKER INSERTION  12/09/2015   [employer] Accolade MRI EL PPM implanted in PA for complete heart block and syncope  . PEG TUBE PLACEMENT    . TIBIA FRACTURE SURGERY Right     FAMILY HISTORY: Family History  Problem Relation Age of Onset  . Diabetes Father   . Heart disease Father   . Breast cancer Sister     SOCIAL HISTORY: Social History   Socioeconomic History  . Marital status: Single    Spouse name: Not on file  . Number of children: Not on file  . Years of education: Not on file  . Highest education level: Not on file  Occupational History  . Occupation: disabled  Social Needs  . Financial resource strain: Not on file  . Food insecurity:    Worry: Not on file    Inability: Not on file  .  Transportation needs:    Medical: Not on file    Non-medical: Not on file  Tobacco Use  . Smoking status: Never Smoker  . Smokeless tobacco: Never Used  Substance and Sexual Activity  . Alcohol use: No  . Drug use: No  . Sexual activity: Not on file  Lifestyle  . Physical activity:    Days per week: Not on file    Minutes per session: Not on file  . Stress: Not on file  Relationships  .  Social connections:    Talks on phone: Not on file    Gets together: Not on file    Attends religious service: Not on file    Active member of club or organization: Not on file    Attends meetings of clubs or organizations: Not on file    Relationship status: Not on file  . Intimate partner violence:    Fear of current or ex partner: Not on file    Emotionally abused: Not on file    Physically abused: Not on file    Forced sexual activity: Not on file  Other Topics Concern  . Not on file  Social History Narrative   LIves in group home       PHYSICAL EXAM  Vitals:   12/14/17 0903  BP: (!) 111/57  Pulse: (!) 50  Weight: 161 lb 8 oz (73.3 kg)  Height:  (1.473 m)   Body mass index is 33.75 kg/m.  Generalized: Well developed, in no acute distress, typical facial features for patient with Down syndrome Neurological examination  Mentation: Alert Follows all commands. Cranial nerve II-XII: Pupils were equal round reactive to light. Extraocular movements were full, visual field were full on confrontational test. Head turning and shoulder shrug  were normal and symmetric. Motor: The motor testing reveals 5 over 5 strength of all 4 extremities. Good symmetric motor tone is noted throughout.  Sensory: Sensory testing is intact to soft touch on all 4 extremities. No evidence of extinction is noted.    Gait and station: Gait is slightly unsteady. Tandem gait not attempted.   DIAGNOSTIC DATA (LABS, IMAGING, TESTING) - I reviewed patient records, labs, notes, testing and imaging myself  where available.  Lab Results  Component Value Date   WBC 5.2 05/26/2017   HGB 11.7 05/26/2017   HCT 35.6 05/26/2017   MCV 102 (H) 05/26/2017   PLT 163 05/26/2017      Component Value Date/Time   NA 138 05/26/2017 1141   K 4.9 05/26/2017 1141   CL 97 05/26/2017 1141   CO2 27 05/26/2017 1141   GLUCOSE 67 05/26/2017 1141   GLUCOSE 94 05/11/2016 1920   BUN 23 05/26/2017 1141   CREATININE 0.80 05/26/2017 1141   CALCIUM 9.1 05/26/2017 1141   PROT 7.7 05/26/2017 1141   ALBUMIN 3.5 (L) 05/26/2017 1141   AST 17 05/26/2017 1141   ALT 11 05/26/2017 1141   ALKPHOS 127 (H) 05/26/2017 1141   BILITOT <0.2 05/26/2017 1141   GFRNONAA 80 05/26/2017 1141   GFRAA 92 05/26/2017 1141     ASSESSMENT AND PLAN 62 y.o. year old female   Down syndrome early onset dementia   Add on Aricept 10 mg daily, Namenda 10 mg twice a day Epilepsy  Recurrent spells of drop attacks,  Family self adjust the medications to Keppra xr  2 tabs qhs,  Dilantin 200 mg twice a day, she is doing well on current combination, attempt to add on lamotrigine 25 mg every night, cause increased sleepiness.   Levert Feinstein, M.D. Ph.D.  [facility] [address] [place], [place] [address] Phone: [phone] Fax:      [phone]

## 2018-01-05 ENCOUNTER — Encounter: Payer: Self-pay | Admitting: Internal Medicine

## 2018-01-05 ENCOUNTER — Ambulatory Visit (INDEPENDENT_AMBULATORY_CARE_PROVIDER_SITE_OTHER): Payer: Medicare Other | Admitting: Internal Medicine

## 2018-01-05 VITALS — BP 130/76 | HR 60 | Ht <= 58 in | Wt 158.0 lb

## 2018-01-05 DIAGNOSIS — E785 Hyperlipidemia, unspecified: Secondary | ICD-10-CM

## 2018-01-05 DIAGNOSIS — Z950 Presence of cardiac pacemaker: Secondary | ICD-10-CM | POA: Diagnosis not present

## 2018-01-05 DIAGNOSIS — I442 Atrioventricular block, complete: Secondary | ICD-10-CM | POA: Diagnosis not present

## 2018-01-05 LAB — CUP PACEART INCLINIC DEVICE CHECK
Brady Statistic RA Percent Paced: 27 %
Brady Statistic RV Percent Paced: 3 %
Implantable Lead Implant Date: 20170508
Implantable Lead Implant Date: 20170508
Implantable Lead Location: 753860
Implantable Lead Model: 7740
Implantable Lead Model: 7741
Implantable Lead Serial Number: 1111
Lead Channel Impedance Value: 621 Ohm
Lead Channel Impedance Value: 624 Ohm
Lead Channel Pacing Threshold Amplitude: 0.9 V
Lead Channel Pacing Threshold Amplitude: 2.4 V
Lead Channel Pacing Threshold Pulse Width: 0.4 ms
Lead Channel Setting Sensing Sensitivity: 2.5 mV
MDC IDC LEAD LOCATION: 753859
MDC IDC LEAD SERIAL: 1111
MDC IDC MSMT LEADCHNL RA SENSING INTR AMPL: 5.1 mV
MDC IDC MSMT LEADCHNL RV PACING THRESHOLD PULSEWIDTH: 1 ms
MDC IDC MSMT LEADCHNL RV SENSING INTR AMPL: 15.2 mV
MDC IDC PG IMPLANT DT: 20170508
MDC IDC SESS DTM: 20190605040000
MDC IDC SET LEADCHNL RA PACING AMPLITUDE: 2 V
MDC IDC SET LEADCHNL RV PACING AMPLITUDE: 5 V
MDC IDC SET LEADCHNL RV PACING PULSEWIDTH: 1 ms
Pulse Gen Serial Number: 747619

## 2018-01-05 NOTE — Patient Instructions (Addendum)
Medication Instructions:  Your physician recommends that you continue on your current medications as directed. Please refer to the Current Medication list given to you today.  Labwork: You will need to get lab work:  Fasting lipids, LFT.  [facility] 01/10/2018 any time.  Testing/Procedures: None ordered.  Follow-Up: Your physician wants you to follow-up in: one year with Francis Dowseenee Ursuy, PA.   You will receive a reminder letter in the mail two months in advance. If you don't receive a letter, please call our office to schedule the follow-up appointment.  Remote monitoring is used to monitor your Pacemaker from home. This monitoring reduces the number of office visits required to check your device to one time per year. It allows us to keep an eye on the functioning of your device to ensure it is working properly. You are scheduled for a device check from home on 01/17/2018. You may send your transmission at any time that day. If you have a wireless device, the transmission will be sent automatically. After your physician reviews your transmission, you will receive a postcard with your next transmission date.  Any Other Special Instructions Will Be Listed Below (If Applicable).  If you need a refill on your cardiac medications before your next appointment, please call your pharmacy.

## 2018-01-05 NOTE — Progress Notes (Signed)
PCP: Tracey HarriesBouska, David, MD   Primary EP:  Dr Ileene MusaAllred  Kaitlyn Powell is a 62 y.o. female who presents today for routine electrophysiology followup.  Since last being seen in our clinic, the patient reports doing very well.  Today, she denies symptoms of palpitations, chest pain, shortness of breath,  lower extremity edema, dizziness, presyncope, or syncope.  The patient is otherwise without complaint today.   Past Medical History:  Diagnosis Date  . Arthritis   . Aspiration pneumonia (HCC)   . Asthma   . Cardiac arrhythmia    have pacemeaker  . Down syndrome   . GERD (gastroesophageal reflux disease)   . Heart block   . Hypothyroidism   . Mental retardation   . Pulmonary emboli (HCC)   . Seizures (HCC)   . Sleep apnea with use of continuous positive airway pressure (CPAP)    Past Surgical History:  Procedure Laterality Date  . PACEMAKER INSERTION  12/09/2015   [employer] Accolade MRI EL PPM implanted in PA for complete heart block and syncope  . PEG TUBE PLACEMENT    . TIBIA FRACTURE SURGERY Right     ROS- all systems are reviewed and negative except as per HPI above  Current Outpatient Medications  Medication Sig Dispense Refill  . citalopram (CELEXA) 20 MG tablet qday    . cyanocobalamin (TH VITAMIN B12) 100 MCG tablet Take 100 mcg by mouth every morning.     . donepezil (ARICEPT) 10 MG tablet Take 1 tablet (10 mg total) by mouth at bedtime. 30 tablet 11  . folic acid (FOLVITE) 1 MG tablet Take 1 mg  by mouth  daily    . ipratropium-albuterol (DUONEB) 0.5-2.5 (3) MG/3ML SOLN Take 1 ampule by nebulization 2 (two) times daily.    Marland Kitchen. levETIRAcetam (KEPPRA XR) 500 MG 24 hr tablet Take 5 tablets (2,500 mg total) by mouth daily. (Patient taking differently: Take 1,000 mg by mouth daily. ) 150 tablet 11  . levothyroxine (SYNTHROID, LEVOTHROID) 150 MCG tablet Take one tablet (150 mcg total)  by mouth  daily    . Melatonin 1 MG TABS Take 1 mg by mouth at bedtime.     .  memantine (NAMENDA) 10 MG tablet Take 1 tablet (10 mg total) by mouth 2 (two) times daily. 60 tablet 11  . OXYGEN Inhale 2 L into the lungs as directed. With CPAP    . phenytoin (DILANTIN) 200 MG ER capsule Take 1 capsule (200 mg total) by mouth 2 (two) times daily. 60 capsule 5  . risperiDONE (RISPERDAL) 1 MG tablet Take 1 mg by mouth every morning and 2 mg at night    . simvastatin (ZOCOR) 40 MG tablet Take 40 mg  by mouth  at bedtime     No current facility-administered medications for this visit.     Physical Exam: Vitals:   01/05/18 1014  BP: 130/76  Pulse: 60  Weight: 158 lb (71.7 kg)  Height: 4\' 10"  (1.473 m)    GEN- The patient is well appearing, alert and oriented x 3 today.   Head- normocephalic, atraumatic Eyes-  Sclera clear, conjunctiva pink Ears- hearing intact Oropharynx- clear Lungs- Clear to ausculation bilaterally, normal work of breathing Chest- pacemaker pocket is well healed Heart- Regular rate and rhythm, no murmurs, rubs or gallops, PMI not laterally displaced GI- soft, NT, ND, + BS Extremities- no clubbing, cyanosis, or edema  Pacemaker interrogation- reviewed in detail today,  See PACEART report  ekg  tracing ordered today is personally reviewed and shows sinus rhythm 60 bpm, PR 188 msec, QRS 82 msec, QTc 422 msec, nonspecific ST/T changes  Assessment and Plan:  1. Symptomatic complete heart block (transient) with syncope Normal pacemaker function See Pace Art report No changes today  2. OSA Use of bipap is encouraged  3. HL She wonders if she should continue zocor Will check fasting lipids, lfts today and then advise  4. Dementia She asks about starting aricept.  She just started namenda.  Given multiple medicines that can affect QT, I have advised that she see if she still needs aricept after assessing response to namenda.  If she starts aricept, I would advise that she return for an ekg about 3 days after initiation to evaluate qt.     [place] Return to see EP PA in a year  Hillis Range MD, [facility] 01/05/2018 10:38 AM

## 2018-01-07 ENCOUNTER — Other Ambulatory Visit: Payer: Self-pay | Admitting: Neurology

## 2018-01-10 ENCOUNTER — Other Ambulatory Visit: Payer: Medicare Other | Admitting: *Deleted

## 2018-01-10 ENCOUNTER — Telehealth: Payer: Self-pay | Admitting: Pulmonary Disease

## 2018-01-10 DIAGNOSIS — I442 Atrioventricular block, complete: Secondary | ICD-10-CM

## 2018-01-10 DIAGNOSIS — E785 Hyperlipidemia, unspecified: Secondary | ICD-10-CM

## 2018-01-10 DIAGNOSIS — Z950 Presence of cardiac pacemaker: Secondary | ICD-10-CM

## 2018-01-10 LAB — HEPATIC FUNCTION PANEL
ALBUMIN: 3.7 g/dL (ref 3.6–4.8)
ALK PHOS: 101 IU/L (ref 39–117)
ALT: 14 IU/L (ref 0–32)
AST: 20 IU/L (ref 0–40)
Bilirubin Total: 0.2 mg/dL (ref 0.0–1.2)
Bilirubin, Direct: 0.11 mg/dL (ref 0.00–0.40)
TOTAL PROTEIN: 7.6 g/dL (ref 6.0–8.5)

## 2018-01-10 LAB — LIPID PANEL
CHOL/HDL RATIO: 1.8 ratio (ref 0.0–4.4)
Cholesterol, Total: 176 mg/dL (ref 100–199)
HDL: 98 mg/dL (ref >39)
LDL CALC: 68 mg/dL (ref 0–99)
Triglycerides: 50 mg/dL (ref 0–149)
VLDL CHOLESTEROL CAL: 10 mg/dL (ref 5–40)

## 2018-01-10 NOTE — Telephone Encounter (Signed)
Called patient, unable to reach left message to give us a call back. 

## 2018-01-11 ENCOUNTER — Ambulatory Visit (INDEPENDENT_AMBULATORY_CARE_PROVIDER_SITE_OTHER): Payer: Medicare Other | Admitting: Pulmonary Disease

## 2018-01-11 ENCOUNTER — Encounter: Payer: Self-pay | Admitting: Pulmonary Disease

## 2018-01-11 VITALS — BP 130/88 | HR 59 | Ht <= 58 in | Wt 159.0 lb

## 2018-01-11 DIAGNOSIS — R1310 Dysphagia, unspecified: Secondary | ICD-10-CM | POA: Diagnosis not present

## 2018-01-11 DIAGNOSIS — J690 Pneumonitis due to inhalation of food and vomit: Secondary | ICD-10-CM | POA: Diagnosis not present

## 2018-01-11 DIAGNOSIS — G4733 Obstructive sleep apnea (adult) (pediatric): Secondary | ICD-10-CM | POA: Diagnosis not present

## 2018-01-11 DIAGNOSIS — R55 Syncope and collapse: Secondary | ICD-10-CM

## 2018-01-11 NOTE — Telephone Encounter (Signed)
Spoke with Kaitlyn Powell and advised her to bring the machine with her to the appt today and we can show her where the SD card is located. She states she will bring it and nothing further is needed.

## 2018-01-11 NOTE — Patient Instructions (Signed)
Chronic respiratory failure with hypercapnia: Continue using BiPAP nightly as you are doing  Aspiration pneumonia: Continue aspiration precautions and dysphasia diet as you are doing already  Syncope: We will arrange for an echocardiogram to check for a condition called pulmonary hypertension  We will see you back in 6 months or sooner if the echocardiogram suggest pulmonary hypertension

## 2018-01-11 NOTE — Progress Notes (Signed)
Subjective:    Patient ID: Kaitlyn Powell, female    DOB: 06/14/1956, 62 y.o.   MRN: [id number]  Synopsis: Referred in July 2018 for evaluation of asthma and pulmonary embolism. The pulmonary embolism was diagnosed in 2017.  That is her only PE and it was provoked by being sedentary in a wheelchair.  She has a significant history of aspiration pneumonia.  Has a past medical history significant for Down syndrome. Had a PEG tube which was removed in November 2017.  She has used BIPAP with oxygen since 2008.   HPI Chief Complaint  Patient presents with  . Follow-up    ROV    Kaitlyn Powell has been compliant with her medications.  She has been using BiPAP nightly and has not had any problems from it.  About once a night she may stay up later talking but in general her sleep habits are fairly predictable.  She continues to have multiple episodes of syncope while sitting on the toilet throughout the course of the week.  She has been undergoing a work-up by neurology and at this point they still do not have an explanation for these episodes.  Past Medical History:  Diagnosis Date  . Arthritis   . Aspiration pneumonia (HCC)   . Asthma   . Cardiac arrhythmia    have pacemeaker  . Down syndrome   . GERD (gastroesophageal reflux disease)   . Heart block   . Hypothyroidism   . Mental retardation   . Pulmonary emboli (HCC)   . Seizures (HCC)   . Sleep apnea with use of continuous positive airway pressure (CPAP)         Review of Systems  Constitutional: Negative for fever and unexpected weight change.  HENT: Negative for nosebleeds, postnasal drip, rhinorrhea, sinus pressure, sneezing, sore throat and trouble swallowing.   Respiratory: Positive for cough and shortness of breath. Negative for chest tightness and wheezing.   Cardiovascular: Negative for palpitations and leg swelling.       Objective:   Physical Exam Vitals:   01/11/18 1600  BP: 130/88  Pulse: (!) 59  SpO2: 91%    Weight: 159 lb (72.1 kg)  Height: 4\' 10"  (1.473 m)    Gen: down syndrome appearance,  HENT: OP clear, thick neck PULM: CTA B, normal percussion CV: RRR, no mgr, trace edema GI: BS+, soft, nontender Derm: no cyanosis or rash Psyche: normal mood and affect     CBC    Component Value Date/Time   WBC 5.2 05/26/2017 1141   WBC 5.4 05/11/2016 1920   RBC 3.48 (L) 05/26/2017 1141   RBC 2.96 (L) 05/11/2016 1920   HGB 11.7 05/26/2017 1141   HCT 35.6 05/26/2017 1141   PLT 163 05/26/2017 1141   MCV 102 (H) 05/26/2017 1141   MCH 33.6 (H) 05/26/2017 1141   MCH 34.1 (H) 05/11/2016 1920   MCHC 32.9 05/26/2017 1141   MCHC 31.3 05/11/2016 1920   RDW 14.2 05/26/2017 1141   LYMPHSABS 1.0 05/26/2017 1141   MONOABS 0.5 05/11/2016 1920   EOSABS 0.3 05/26/2017 1141   BASOSABS 0.1 05/26/2017 1141    Records from her visit with cardiology reviewed where she was seen for her pacemaker.  No changes were made to her medications.       Assessment & Plan:  Syncope, unspecified syncope type - Plan: ECHOCARDIOGRAM COMPLETE  Aspiration pneumonia, unspecified aspiration pneumonia type, unspecified laterality, unspecified part of lung (HCC)  OSA (obstructive sleep apnea)  Dysphagia,  unspecified type  Discussion: This has been a stable interval for [name].  However she continues to struggle with frequent episodes of syncope in the bathroom.  I explained to sister that I think the pretest probability of pulmonary hypertension from her chronic hypoventilation and issues with aspiration pneumonia over the years is exceedingly high.  This could be an explanation for the Valsalva related syncope.  That being said, I am not sure there is a great treatment option here considering her overall situation.  Plan: Chronic respiratory failure with hypercapnia: Continue using BiPAP nightly as you are doing  Aspiration pneumonia: Continue aspiration precautions and dysphasia diet as you are doing  already  Syncope: We will arrange for an echocardiogram to check for a condition called pulmonary hypertension  We will see you back in 6 months or sooner if the echocardiogram suggest pulmonary hypertension   Current Outpatient Medications:  .  citalopram (CELEXA) 20 MG tablet, qday, Disp: , Rfl:  .  cyanocobalamin (TH VITAMIN B12) 100 MCG tablet, Take 100 mcg by mouth every morning. , Disp: , Rfl:  .  folic acid (FOLVITE) 1 MG tablet, Take 1 mg  by mouth  daily, Disp: , Rfl:  .  ipratropium-albuterol (DUONEB) 0.5-2.5 (3) MG/3ML SOLN, Take 1 ampule by nebulization 2 (two) times daily., Disp: , Rfl:  .  levETIRAcetam (KEPPRA XR) 500 MG 24 hr tablet, Take 5 tablets (2,500 mg total) by mouth daily. (Patient taking differently: Take 1,000 mg by mouth daily. ), Disp: 150 tablet, Rfl: 11 .  levothyroxine (SYNTHROID, LEVOTHROID) 150 MCG tablet, Take one tablet (150 mcg total)  by mouth  daily, Disp: , Rfl:  .  Melatonin 1 MG TABS, Take 1 mg by mouth at bedtime. , Disp: , Rfl:  .  memantine (NAMENDA) 10 MG tablet, TAKE 1 TABLET BY MOUTH TWICE A DAY, Disp: 180 tablet, Rfl: 3 .  OXYGEN, Inhale 2 L into the lungs as directed. With CPAP, Disp: , Rfl:  .  phenytoin (DILANTIN) 200 MG ER capsule, Take 1 capsule (200 mg total) by mouth 2 (two) times daily., Disp: 60 capsule, Rfl: 5 .  risperiDONE (RISPERDAL) 1 MG tablet, Take 1 mg by mouth every morning and 2 mg at night, Disp: , Rfl:  .  simvastatin (ZOCOR) 40 MG tablet, Take 40 mg  by mouth  at bedtime, Disp: , Rfl:

## 2018-01-12 ENCOUNTER — Other Ambulatory Visit: Payer: Self-pay

## 2018-01-12 DIAGNOSIS — E785 Hyperlipidemia, unspecified: Secondary | ICD-10-CM

## 2018-01-12 NOTE — Progress Notes (Signed)
Repeat lipids in 3 months s/p discontinuing simvastatin.

## 2018-01-17 ENCOUNTER — Encounter: Payer: Self-pay | Admitting: Neurology

## 2018-01-17 ENCOUNTER — Telehealth: Payer: Self-pay | Admitting: Cardiology

## 2018-01-17 ENCOUNTER — Telehealth: Payer: Self-pay | Admitting: Neurology

## 2018-01-17 ENCOUNTER — Encounter: Payer: Self-pay | Admitting: *Deleted

## 2018-01-17 ENCOUNTER — Ambulatory Visit (INDEPENDENT_AMBULATORY_CARE_PROVIDER_SITE_OTHER): Payer: Medicare Other | Admitting: *Deleted

## 2018-01-17 ENCOUNTER — Encounter: Payer: Medicare Other | Admitting: Internal Medicine

## 2018-01-17 DIAGNOSIS — I442 Atrioventricular block, complete: Secondary | ICD-10-CM | POA: Diagnosis not present

## 2018-01-17 NOTE — Telephone Encounter (Signed)
Confirmed remote transmission w/ pt daughter.   

## 2018-01-17 NOTE — Telephone Encounter (Signed)
From: Benito MccreedyJeanne L Magda  Sent: 01/17/2018 12:39 PM  To: [facility]  Subject: Visit Follow-Up Question               ----- Message from Mychart, Generic sent at 01/17/2018 12:39 PM EDT -----    Peri JeffersonGood afternoon Dr Terrace ArabiaYan-  After [name]'s visit with you on June14, 2019, it was decided that we would start Kaitlyn PoagJeanne on Memantine(nemenda), as well as Aricept ( donepezil). When we went to pick up her medications the pharmacist stated that the Aricept would interfere with her heart rhythm, so we did NOT introduce the Aricept to [name]. Last week we had a visit with her cardiologist, and he too, stated that unless we really thought it was necessary, that we DO NOT start her on Aricept.  In the meantime, Kaitlyn PoagJeanne will be on nemenda almost a month, and she is having too many side effect symptoms! She is lethargic, confused more than usual, making uncontrolled mouth, tongue and jaw movements, constipated, coughing, and this week she developed a rash, which is now gone.   Please let us know the proper measures to wheen her off of nemenda.   Thank you for your prompt attention in this matter!  Kaitlyn BruinsMary Powell, POA (sister of Mickel FuchsJeanne Powell)  [phone]    Please call her sister, it is ok to hold off aricept, which might slow her heart rate, if namenda cause more problem rather than helping her, it is ok to stop namenda 10mg  every night for 3 days then stop

## 2018-01-17 NOTE — Telephone Encounter (Signed)
Spoke to patient's sister on DPR and she verbalized understanding of Dr. Zannie CoveYan's instructions below.

## 2018-01-18 ENCOUNTER — Ambulatory Visit (HOSPITAL_COMMUNITY): Payer: Medicare Other | Attending: Cardiovascular Disease

## 2018-01-18 ENCOUNTER — Encounter: Payer: Self-pay | Admitting: Cardiology

## 2018-01-18 ENCOUNTER — Other Ambulatory Visit: Payer: Self-pay

## 2018-01-18 DIAGNOSIS — G4733 Obstructive sleep apnea (adult) (pediatric): Secondary | ICD-10-CM | POA: Insufficient documentation

## 2018-01-18 DIAGNOSIS — R55 Syncope and collapse: Secondary | ICD-10-CM | POA: Insufficient documentation

## 2018-01-18 NOTE — Progress Notes (Signed)
Remote pacemaker transmission.   

## 2018-01-19 ENCOUNTER — Telehealth: Payer: Self-pay | Admitting: Pulmonary Disease

## 2018-01-19 NOTE — Telephone Encounter (Signed)
lmtcb x1 for pt's sister, [name].  Notes recorded by Lupita LeashMcQuaid, Douglas B, MD on 01/18/2018 at 4:16 PM EDT BJ, Please let the patient know this showed mild pulmonary hypertension, will discuss more on the next vsiit. Thanks, B

## 2018-01-19 NOTE — Telephone Encounter (Signed)
Pt's sister Kaitlyn Powell is calling back [phone]

## 2018-01-19 NOTE — Telephone Encounter (Signed)
Result Notes for ECHOCARDIOGRAM COMPLETE   Notes recorded by Eliseo GumBlankenship, Margie A, CMA on 01/19/2018 at 3:09 PM EDT Please see 01/19/18 phone note. ------  Notes recorded by Tana Feltsillman, Bettie J, RN on 01/18/2018 at 4:40 PM EDT Attempted to call patient, no answer, left message to call back. ------  Notes recorded by Lupita LeashMcQuaid, Douglas B, MD on 01/18/2018 at 4:16 PM EDT BJ, Please let the patient know this showed mild pulmonary hypertension, will discuss more on the next vsiit. Thanks, B   Called pt's sister [name] and relayed the results of the echo to her.  [name] expressed understanding. Nothing further needed.

## 2018-01-24 ENCOUNTER — Ambulatory Visit: Payer: Medicare Other | Admitting: Podiatry

## 2018-01-26 ENCOUNTER — Ambulatory Visit: Payer: Medicare Other | Admitting: Podiatry

## 2018-02-02 LAB — CUP PACEART REMOTE DEVICE CHECK
Battery Remaining Longevity: 156 mo
Brady Statistic RV Percent Paced: 4 %
Implantable Lead Implant Date: 20170508
Implantable Lead Location: 753859
Implantable Lead Model: 7740
Implantable Lead Serial Number: 1111
Implantable Pulse Generator Implant Date: 20170508
Lead Channel Pacing Threshold Pulse Width: 0.4 ms
Lead Channel Setting Pacing Pulse Width: 1 ms
Lead Channel Setting Sensing Sensitivity: 2.5 mV
MDC IDC LEAD IMPLANT DT: 20170508
MDC IDC LEAD LOCATION: 753860
MDC IDC LEAD SERIAL: 1111
MDC IDC MSMT BATTERY REMAINING PERCENTAGE: 100 %
MDC IDC MSMT LEADCHNL RA IMPEDANCE VALUE: 615 Ohm
MDC IDC MSMT LEADCHNL RA PACING THRESHOLD AMPLITUDE: 0.7 V
MDC IDC MSMT LEADCHNL RV IMPEDANCE VALUE: 605 Ohm
MDC IDC PG SERIAL: 747619
MDC IDC SESS DTM: 20190618063100
MDC IDC SET LEADCHNL RA PACING AMPLITUDE: 2 V
MDC IDC SET LEADCHNL RV PACING AMPLITUDE: 5 V
MDC IDC STAT BRADY RA PERCENT PACED: 32 %

## 2018-02-04 ENCOUNTER — Telehealth: Payer: Self-pay | Admitting: Gastroenterology

## 2018-02-04 NOTE — Telephone Encounter (Signed)
Spoke with [name], the legal guardian of the patient. She began complaining of abdominal pain 2 days ago. Last bowel movement was also 2 days ago. Patient was given Senna as is the normal treatment for her to have a bowel movement. This time it did not produce a bowel movement. Patient's brother in-law is a Engineer, civil (consulting)nurse. He states the abdomen is soft and non-tender. No vomiting. Early satiety which "is not her." When I called, the patient and her care givers were at the PCP. She will let us know what the plan is.

## 2018-02-06 ENCOUNTER — Emergency Department (HOSPITAL_COMMUNITY): Payer: Medicare Other

## 2018-02-06 ENCOUNTER — Encounter (HOSPITAL_COMMUNITY): Payer: Self-pay

## 2018-02-06 ENCOUNTER — Emergency Department (HOSPITAL_COMMUNITY)
Admission: EM | Admit: 2018-02-06 | Discharge: 2018-02-06 | Disposition: A | Discharge status: Home / Self Care | Payer: Medicare Other | Attending: Emergency Medicine | Admitting: Emergency Medicine

## 2018-02-06 ENCOUNTER — Other Ambulatory Visit: Payer: Self-pay

## 2018-02-06 DIAGNOSIS — Q909 Down syndrome, unspecified: Secondary | ICD-10-CM | POA: Insufficient documentation

## 2018-02-06 DIAGNOSIS — Z79899 Other long term (current) drug therapy: Secondary | ICD-10-CM | POA: Diagnosis not present

## 2018-02-06 DIAGNOSIS — K828 Other specified diseases of gallbladder: Secondary | ICD-10-CM | POA: Insufficient documentation

## 2018-02-06 DIAGNOSIS — R05 Cough: Secondary | ICD-10-CM | POA: Insufficient documentation

## 2018-02-06 DIAGNOSIS — F79 Unspecified intellectual disabilities: Secondary | ICD-10-CM | POA: Insufficient documentation

## 2018-02-06 DIAGNOSIS — E871 Hypo-osmolality and hyponatremia: Secondary | ICD-10-CM | POA: Diagnosis not present

## 2018-02-06 DIAGNOSIS — J45909 Unspecified asthma, uncomplicated: Secondary | ICD-10-CM | POA: Insufficient documentation

## 2018-02-06 DIAGNOSIS — R1030 Lower abdominal pain, unspecified: Secondary | ICD-10-CM | POA: Diagnosis present

## 2018-02-06 DIAGNOSIS — R630 Anorexia: Secondary | ICD-10-CM | POA: Insufficient documentation

## 2018-02-06 DIAGNOSIS — E039 Hypothyroidism, unspecified: Secondary | ICD-10-CM | POA: Insufficient documentation

## 2018-02-06 DIAGNOSIS — Z950 Presence of cardiac pacemaker: Secondary | ICD-10-CM | POA: Insufficient documentation

## 2018-02-06 LAB — COMPREHENSIVE METABOLIC PANEL WITH GFR
ALT: 12 U/L (ref 0–44)
AST: 22 U/L (ref 15–41)
Albumin: 3.3 g/dL — ABNORMAL LOW (ref 3.5–5.0)
Alkaline Phosphatase: 83 U/L (ref 38–126)
Anion gap: 11 (ref 5–15)
BUN: 16 mg/dL (ref 8–23)
CO2: 28 mmol/L (ref 22–32)
Calcium: 9.1 mg/dL (ref 8.9–10.3)
Chloride: 89 mmol/L — ABNORMAL LOW (ref 98–111)
Creatinine, Ser: 0.76 mg/dL (ref 0.44–1.00)
GFR calc Af Amer: >60 mL/min
GFR calc non Af Amer: >60 mL/min
Glucose, Bld: 100 mg/dL — ABNORMAL HIGH (ref 70–99)
Potassium: 4.5 mmol/L (ref 3.5–5.1)
Sodium: 128 mmol/L — ABNORMAL LOW (ref 135–145)
Total Bilirubin: 0.5 mg/dL (ref 0.3–1.2)
Total Protein: 8.3 g/dL — ABNORMAL HIGH (ref 6.5–8.1)

## 2018-02-06 LAB — CBC
HCT: 37 % (ref 36.0–46.0)
Hemoglobin: 12.9 g/dL (ref 12.0–15.0)
MCH: 34.6 pg — ABNORMAL HIGH (ref 26.0–34.0)
MCHC: 34.9 g/dL (ref 30.0–36.0)
MCV: 99.2 fL (ref 78.0–100.0)
Platelets: 157 10*3/uL (ref 150–400)
RBC: 3.73 MIL/uL — ABNORMAL LOW (ref 3.87–5.11)
RDW: 12.1 % (ref 11.5–15.5)
WBC: 5 10*3/uL (ref 4.0–10.5)

## 2018-02-06 LAB — URINALYSIS, ROUTINE W REFLEX MICROSCOPIC
Bilirubin Urine: NEGATIVE
Glucose, UA: NEGATIVE mg/dL
KETONES UR: NEGATIVE mg/dL
Leukocytes, UA: NEGATIVE
Nitrite: NEGATIVE
PH: 8 (ref 5.0–8.0)
PROTEIN: NEGATIVE mg/dL
Specific Gravity, Urine: 1.004 — ABNORMAL LOW (ref 1.005–1.030)

## 2018-02-06 LAB — LIPASE, BLOOD: LIPASE: 88 U/L — AB (ref 11–51)

## 2018-02-06 MED ORDER — SODIUM CHLORIDE 0.9 % IV BOLUS
1000.0000 mL | Freq: Once | INTRAVENOUS | Status: AC
  Administered 2018-02-06: 1000 mL via INTRAVENOUS

## 2018-02-06 MED ORDER — MORPHINE SULFATE (PF) 4 MG/ML IV SOLN
4.0000 mg | Freq: Once | INTRAVENOUS | Status: AC
  Administered 2018-02-06: 4 mg via INTRAVENOUS
  Filled 2018-02-06: qty 1

## 2018-02-06 MED ORDER — IOPAMIDOL (ISOVUE-300) INJECTION 61%
100.0000 mL | Freq: Once | INTRAVENOUS | Status: AC | PRN
  Administered 2018-02-06: 100 mL via INTRAVENOUS

## 2018-02-06 MED ORDER — ONDANSETRON HCL 4 MG/2ML IJ SOLN
4.0000 mg | Freq: Once | INTRAMUSCULAR | Status: AC
  Administered 2018-02-06: 4 mg via INTRAVENOUS
  Filled 2018-02-06: qty 2

## 2018-02-06 MED ORDER — IOPAMIDOL (ISOVUE-300) INJECTION 61%
INTRAVENOUS | Status: AC
  Filled 2018-02-06: qty 100

## 2018-02-06 NOTE — ED Notes (Signed)
Pt is unable to give urine sample at this time. 

## 2018-02-06 NOTE — ED Notes (Addendum)
Went to pt on [place], family refused stating that her oxygen level is always low.

## 2018-02-06 NOTE — ED Provider Notes (Signed)
[facility]-EMERGENCY DEPT Provider Note   CSN: [id number] Arrival date & time: 02/06/18  1711     History   Chief Complaint Chief Complaint  Patient presents with  . Abdominal Pain    HPI Kaitlyn Powell is a 62 y.o. female.  Kaitlyn Powell is a 62 y.o. Female with a history of Down syndrome, seizures, pulmonary embolus, hypothyroidism, GERD, asthma and cardiac arrhythmia with pacemaker, who presents to the emergency department for evaluation of lower abdominal pain.  This started approximately 4 to 5 days ago, patient was initially seen by her PCP and had a urinalysis done which showed no evidence of urinary tract infection, but patient is prone to these and was given prescription for nitrofurantoin.  Family reports abdominal pain has persisted, and patient rarely complains of pain.  He denies fevers, nausea or vomiting the patient has had very poor appetite she is continued to drink well but has not been interested in eating which is very atypical for her.  They deny diarrhea, constipation, melena or hematochezia.  No chest pain or shortness of breath.  Family does report some intermittent cough and seems to make abdominal pain worse.  They were unable to get into see patient's GI doctor.     Past Medical History:  Diagnosis Date  . Arthritis   . Aspiration pneumonia (HCC)   . Asthma   . Cardiac arrhythmia    have pacemeaker  . Down syndrome   . GERD (gastroesophageal reflux disease)   . Heart block   . Hypothyroidism   . Mental retardation   . Pulmonary emboli (HCC)   . Seizures (HCC)   . Sleep apnea with use of continuous positive airway pressure (CPAP)     Patient Active Problem List   Diagnosis Date Noted  . Functional urinary incontinence 07/22/2017  . OSA treated with BiPAP 12/31/2016  . Mixed conductive and sensorineural hearing loss of both ears 08/05/2016  . Seizures (HCC) 07/03/2016  . Bilateral hearing loss 07/03/2016  . Down syndrome  05/10/2016  . Anemia due to medication 05/10/2016  . Acquired hypothyroidism 05/10/2016  . Elevated lipase 05/10/2016  . History of pulmonary embolism 05/10/2016  . G tube feedings (HCC) 05/10/2016  . Pacemaker 05/10/2016  . S/P percutaneous endoscopic gastrostomy (PEG) tube placement (HCC) 05/10/2016    Past Surgical History:  Procedure Laterality Date  . PACEMAKER INSERTION  12/09/2015   [employer] Accolade MRI EL PPM implanted in PA for complete heart block and syncope  . PEG TUBE PLACEMENT    . TIBIA FRACTURE SURGERY Right      OB History   None      Home Medications    Prior to Admission medications   Medication Sig Start Date End Date Taking? Authorizing Provider  citalopram (CELEXA) 20 MG tablet Take 20 mg by mouth daily.   Yes [provider]  cyanocobalamin (TH VITAMIN B12) 100 MCG tablet Take 100 mcg by mouth every morning.    Yes [provider]  folic acid (FOLVITE) 1 MG tablet Take 1 mg  by mouth  daily   Yes [provider]  ipratropium-albuterol (DUONEB) 0.5-2.5 (3) MG/3ML SOLN Inhale 3 mLs into the lungs every 6 (six) hours as needed (shortness of breath).  07/03/16  Yes [provider]  levETIRAcetam (KEPPRA XR) 500 MG 24 hr tablet Take 5 tablets (2,500 mg total) by mouth daily. Patient taking differently: Take 1,000 mg by mouth at bedtime.  07/16/17  Yes Butch PennyMillikan, Megan, NP  levothyroxine (SYNTHROID, LEVOTHROID) 150 MCG tablet Take 150 mcg by mouth daily before breakfast.  05/21/16  Yes [provider]  Melatonin 1 MG TABS Take 1 mg by mouth at bedtime.    Yes [provider]  mometasone (NASONEX) 50 MCG/ACT nasal spray Place 2 sprays into the nose at bedtime as needed (allergies).   Yes [provider]  nitrofurantoin, macrocrystal-monohydrate, (MACROBID) 100 MG capsule Take 100 mg by mouth 2 (two) times daily. 02/04/18 02/14/18 Yes [provider]  phenytoin (DILANTIN) 200 MG ER  capsule Take 1 capsule (200 mg total) by mouth 2 (two) times daily. 07/16/17  Yes Butch PennyMillikan, Megan, NP  risperiDONE (RISPERDAL) 1 MG tablet Take 1 mg by mouth every morning and 2 mg at night   Yes [provider]  sodium chloride (MURO 128) 2 % ophthalmic solution Place 1 drop into both eyes at bedtime.   Yes [provider]  OXYGEN Inhale 2 L into the lungs as directed. With CPAP    [provider]  simvastatin (ZOCOR) 40 MG tablet Take 40 mg  by mouth  at bedtime    [provider]    Family History Family History  Problem Relation Age of Onset  . Diabetes Father   . Heart disease Father   . Breast cancer Sister     Social History Social History   Tobacco Use  . Smoking status: Never Smoker  . Smokeless tobacco: Never Used  Substance Use Topics  . Alcohol use: No  . Drug use: No     Allergies   Cephalexin   Review of Systems Review of Systems  Constitutional: Positive for appetite change. Negative for chills and fever.  HENT: Negative.   Eyes: Negative for visual disturbance.  Respiratory: Positive for cough. Negative for shortness of breath.   Cardiovascular: Negative for chest pain.  Gastrointestinal: Positive for abdominal pain. Negative for blood in stool, constipation, diarrhea, nausea and vomiting.  Genitourinary: Negative for dysuria and frequency.  Musculoskeletal: Negative for arthralgias and myalgias.  Skin: Negative for color change and rash.  Neurological: Negative for dizziness, syncope and light-headedness.     Physical Exam Updated Vital Signs Blood pressure 132/63, pulse (!) 56, resp. rate 18, height 4\' 10"  (1.473 m), weight 70.6 kg (155 lb 9.6 oz), SpO2 100 %.   Physical Exam  Constitutional: She appears well-developed and well-nourished.  Non-toxic appearance. She does not appear ill. No distress.  HENT:  Head: Normocephalic and atraumatic.  Mouth/Throat: Oropharynx is clear and moist.  Eyes: Right eye  exhibits no discharge. Left eye exhibits no discharge.  Cardiovascular: Normal rate, regular rhythm, normal heart sounds and intact distal pulses.  Pulmonary/Chest: Effort normal and breath sounds normal. No respiratory distress.  Respirations equal and unlabored, patient able to speak in full sentences, lungs clear to auscultation bilaterally  Abdominal: Soft. Normal appearance. She exhibits no distension. Bowel sounds are increased. There is tenderness in the right lower quadrant and left lower quadrant. There is guarding. There is no rigidity, no rebound and no CVA tenderness.  Abdomen soft and nondistended, bowel sounds hyperactive throughout, there is diffuse lower abdominal tenderness with guarding, worse in the right lower quadrant  Neurological: She is alert. Coordination normal.  Skin: Skin is warm and dry. Capillary refill takes less than 2 seconds. She is not diaphoretic.  Psychiatric: She has a normal mood and affect. Her behavior is normal.  Nursing note and vitals reviewed.  ED Treatments / Results  Labs (all labs ordered are listed, but only abnormal results are displayed) Labs Reviewed  LIPASE, BLOOD - Abnormal; Notable for the following components:      Result Value   Lipase 88 (*)    All other components within normal limits  COMPREHENSIVE METABOLIC PANEL - Abnormal; Notable for the following components:   Sodium 128 (*)    Chloride 89 (*)    Glucose, Bld 100 (*)    Total Protein 8.3 (*)    Albumin 3.3 (*)    All other components within normal limits  CBC - Abnormal; Notable for the following components:   RBC 3.73 (*)    MCH 34.6 (*)    All other components within normal limits  URINALYSIS, ROUTINE W REFLEX MICROSCOPIC - Abnormal; Notable for the following components:   Specific Gravity, Urine 1.004 (*)    Hgb urine dipstick SMALL (*)    Bacteria, UA RARE (*)    All other components within normal limits    EKG None  Radiology Dg Chest 2 View  Result  Date: 02/06/2018 CLINICAL DATA:  Recent cough, hypoxemia, history asthma, pulmonary embolism, pneumonia, Down syndrome EXAM: CHEST - 2 VIEW COMPARISON:  02/11/2017, 05/11/2016 FINDINGS: RIGHT subclavian transvenous pacemaker leads project over RIGHT atrium and RIGHT ventricle, unchanged. Upper normal heart size. Slight pulmonary vascular congestion. Prominence of RIGHT hilum unchanged. Atherosclerotic calcification aorta. Mild streaky atelectasis RIGHT lower lobe. Remaining lungs clear. No pleural effusion or pneumothorax. Old fracture deformities of the LEFT clavicle and multiple LEFT ribs. Chronic compression deformity of upper to mid thoracic vertebral body unchanged, approximately T5. IMPRESSION: Chronic prominence of RIGHT hilum unchanged since 2017, in combination with prior exams suspect due to enlarged central pulmonary arteries question pulmonary arterial hypertension. RIGHT basilar atelectasis. Electronically Signed   By: Ulyses Southward M.D.   On: 02/06/2018 19:11   Ct Abdomen Pelvis W Contrast  Result Date: 02/06/2018 CLINICAL DATA:  Abdominal pain since Thursday, negative urinalysis, question appendicitis; history Down syndrome, GERD, asthma EXAM: CT ABDOMEN AND PELVIS WITH CONTRAST TECHNIQUE: Multidetector CT imaging of the abdomen and pelvis was performed using the standard protocol following bolus administration of intravenous contrast. Sagittal and coronal MPR images reconstructed from axial data set. CONTRAST:  ISOVUE-300 IOPAMIDOL (ISOVUE-300) INJECTION 61% IV. No oral contrast. COMPARISON:  None FINDINGS: Lower chest: Bibasilar atelectasis. Pacemaker leads RIGHT atrium and RIGHT ventricle. Hepatobiliary: Gallbladder and liver normal appearance Pancreas: Normal appearance Spleen: Normal appearance Adrenals/Urinary Tract: Adrenal glands, kidneys, and ureters normal appearance. Minimal bladder wall thickening, nonspecific, could be related to cystitis or chronic outlet obstruction. No discrete  bladder mass. Stomach/Bowel: Appendix not localized but no pericecal inflammatory process is seen. Colon interposition between liver and diaphragm. Stomach decompressed. No definite acute bowel abnormalities. Vascular/Lymphatic: Aorta normal caliber. Vascular structures grossly patent. No adenopathy. Reproductive: Atrophic uterus.  Atrophic ovaries. Other: No free air or free fluid. No hernia or acute inflammatory process. Musculoskeletal: Degenerative disc disease changes of the lumbar spine. Compression deformities of endplates at T12 and L1, appear old. IMPRESSION: No acute intra-abdominal or intrapelvic abnormalities. Bibasilar atelectasis. Minimal nonspecific bladder wall thickening. Electronically Signed   By: Ulyses Southward M.D.   On: 02/06/2018 21:01   US Abdomen Limited Ruq  Result Date: 02/06/2018 CLINICAL DATA:  Stated history of enlarged gallbladder. Patient reports abdominal pain. EXAM: ULTRASOUND ABDOMEN LIMITED RIGHT UPPER QUADRANT COMPARISON:  Abdominopelvic CT earlier this day FINDINGS: Gallbladder: Physiologically distended. No gallstones or wall thickening visualized.  No sonographic Murphy sign noted by sonographer. Common bile duct: Diameter: 5 mm, normal. Liver: No focal lesion identified. Within normal limits in parenchymal echogenicity. Portal vein is patent on color Doppler imaging with normal direction of blood flow towards the liver. IMPRESSION: Unremarkable right upper quadrant ultrasound. Particularly, normal sonographic appearance of the gallbladder. Electronically Signed   By: Rubye Oaks M.D.   On: 02/06/2018 22:30    Procedures Procedures (including critical care time)  Medications Ordered in ED Medications  iopamidol (ISOVUE-300) 61 % injection (has no administration in time range)  ondansetron (ZOFRAN) injection 4 mg (4 mg Intravenous Given 02/06/18 1846)  sodium chloride 0.9 % bolus 1,000 mL (0 mLs Intravenous Stopped 02/06/18 1947)  morphine 4 MG/ML injection 4 mg (4  mg Intravenous Given 02/06/18 1846)  iopamidol (ISOVUE-300) 61 % injection 100 mL (100 mLs Intravenous Contrast Given 02/06/18 2030)     Initial Impression / Assessment and Plan / ED Course  I have reviewed the triage vital signs and the nursing notes.  Pertinent labs & imaging results that were available during my care of the patient were reviewed by me and considered in my medical decision making (see chart for details).  Patient presents for evaluation of lower abdominal pain.  Patient with Down syndrome and has difficulty communicating her symptoms, family at bedside report patient complaining of pain is highly atypical.  No fevers, nausea or vomiting, patient with very poor appetite.  No diarrhea, melena, hematochezia.  On exam vitals normal and patient appears to be in no acute distress.  Lower abdomen is focally tender with guarding, tenderness worse in the right lower quadrant than the left.  Will get abdominal labs and CT abdomen pelvis given focal tenderness, concern for appendicitis, versus colitis or other acute intra-abdominal pathology.  Will get chest x-ray given intermittent cough.  IV fluids, morphine and Zofran for symptomatic management.  No leukocytosis, hemoglobin normal, mild hyponatremia of 128, and hypochloremia of 89, which is likely in the setting of dehydration, fluid bolus given, no other acute electrolyte derangements, normal renal and liver function, lipase slightly elevated at 88.  No evidence of urinary tract infection.  Chest x-ray shows no active cardiopulmonary disease.  CT abdomen pelvis shows no acute intra-abdominal pathology, there is some mild bladder wall thickening but again no evidence of urinary tract infection on urinalysis.  My review of the images suggests a somewhat enlarged gallbladder, will get right upper quadrant ultrasound to rule out pathology.   Right upper quadrant ultrasound unremarkable.  Discussed reassuring results with family, patient appears  comfortable and is tolerating p.o. here in the ED.  Encourage boost and Ensure to help with nutrition, patient with hyperactive bowel sounds on exam, something may be related to gas bloating, recommend over-the-counter simethicone and close follow-up with primary doctor and GI doctor.  Family expressed understanding and are in agreement with plan.  Return precautions discussed.  Patient discussed with Dr. Madilyn Hook, who saw patient as well and agrees with plan.   Final Clinical Impressions(s) / ED Diagnoses   Final diagnoses:  Lower abdominal pain  Poor appetite  Hyponatremia    ED Discharge Orders    None       Dartha Lodge, [place] 02/07/18 0047    Tilden Fossa, MD 02/11/18 1002

## 2018-02-06 NOTE — ED Notes (Signed)
Pt given water to take home meds PO per provider

## 2018-02-06 NOTE — ED Triage Notes (Signed)
Pt c/o abd pain since Thursday. Pt was seen at PCP the other day, and had a UA done, which was negative. Family concerned for appendicitis.

## 2018-02-06 NOTE — ED Notes (Signed)
Patient transported to X-ray 

## 2018-02-06 NOTE — Discharge Instructions (Addendum)
Labs, CT scan and ultrasound overall very reassuring.  Gallbladder initially looked enlarged on CT but right upper quadrant ultrasound shows no issues with the gallbladder or liver.  Sodium is slightly low at 128 but labs are otherwise very reassuring, no evidence of urinary tract infection.  Please continue to use boost and Ensure to help with nutrition.  Headache pain may be related to gas, you can try simethicone or over-the-counter Gas-X to help with discomfort.  Please follow closely with your primary care doctor and GI doctor.  We did not check at the line level today this may be something that your PCP would like to check to ensure it is in an appropriate level given her decreased intake.   Please return to the emergency department for fevers, worsening abdominal pain, nausea, vomiting, blood in the stool or any other new or concerning symptoms.

## 2018-02-06 NOTE — ED Notes (Addendum)
Family aware patient needs a urine sample.

## 2018-02-23 ENCOUNTER — Encounter: Payer: Self-pay | Admitting: Podiatry

## 2018-02-23 ENCOUNTER — Ambulatory Visit (INDEPENDENT_AMBULATORY_CARE_PROVIDER_SITE_OTHER): Payer: Medicare Other | Admitting: Podiatry

## 2018-02-23 DIAGNOSIS — M79609 Pain in unspecified limb: Secondary | ICD-10-CM | POA: Diagnosis not present

## 2018-02-23 DIAGNOSIS — B351 Tinea unguium: Secondary | ICD-10-CM

## 2018-02-25 NOTE — Progress Notes (Signed)
   SUBJECTIVE Patient presents to office today complaining of elongated, thickened nails that cause pain while ambulating in shoes. She is unable to trim her own nails. Patient is here for further evaluation and treatment.  Past Medical History:  Diagnosis Date  . Arthritis   . Aspiration pneumonia (HCC)   . Asthma   . Cardiac arrhythmia    have pacemeaker  . Down syndrome   . GERD (gastroesophageal reflux disease)   . Heart block   . Hypothyroidism   . Mental retardation   . Pulmonary emboli (HCC)   . Seizures (HCC)   . Sleep apnea with use of continuous positive airway pressure (CPAP)     OBJECTIVE General Patient is awake, alert, and oriented x 3 and in no acute distress. Derm Skin is dry and supple bilateral. Negative open lesions or macerations. Remaining integument unremarkable. Nails are tender, long, thickened and dystrophic with subungual debris, consistent with onychomycosis, 1-5 bilateral. No signs of infection noted. Vasc  DP and PT pedal pulses palpable bilaterally. Temperature gradient within normal limits.  Neuro Epicritic and protective threshold sensation grossly intact bilaterally.  Musculoskeletal Exam No symptomatic pedal deformities noted bilateral. Muscular strength within normal limits.  ASSESSMENT 1. Onychodystrophic nails 1-5 bilateral with hyperkeratosis of nails.  2. Onychomycosis of nail due to dermatophyte bilateral 3. Pain in foot bilateral  PLAN OF CARE 1. Patient evaluated today.  2. Instructed to maintain good pedal hygiene and foot care.  3. Mechanical debridement of nails 1-5 bilaterally performed using a nail nipper. Filed with dremel without incident.  4. Return to clinic in 3 mos.    Felecia ShellingBrent M. Tabbetha Kutscher, DPM [facility]  Dr. Felecia ShellingBrent M. Taja Pentland, DPM    [address]                                        [place], [place] [address]                Office [phone]  Fax [phone]

## 2018-02-28 ENCOUNTER — Encounter: Payer: Self-pay | Admitting: Neurology

## 2018-02-28 ENCOUNTER — Telehealth: Payer: Self-pay | Admitting: *Deleted

## 2018-02-28 DIAGNOSIS — Q909 Down syndrome, unspecified: Secondary | ICD-10-CM

## 2018-02-28 DIAGNOSIS — R569 Unspecified convulsions: Secondary | ICD-10-CM

## 2018-02-28 NOTE — Telephone Encounter (Signed)
Email from patient:  Hello Dr Terrace ArabiaYan-    During our last visit with you on Dec 14, 2017, we requested a script for a wheelchair for my sister, Mickel FuchsJeanne Noxon.  You provided that for us, however, upon shopping for the wheelchair, we were told that another script is need which states " TRANSPORT WHEELCHAIR" . Our request to you is, if you could kindly fax a new script to:   [facility] at [phone], which would [employer] Transport Wheelchair for Owens & MinorJeanne Gerbino, along with your notes that support the need for Donnamarie PoagJeanne to have a transport wheelchair.  Thank you in advance for your immediate attention to this matter as they are holding a transport wheelchair for [place]ne.    Sincerely,   Stacie GlazeMary Zillich,POA for Owens & MinorJeanne Christoffel  [phone] --if you should have any questions.

## 2018-02-28 NOTE — Telephone Encounter (Signed)
Transport wheelchair orders and patient's notes fax[facility] and [facility].

## 2018-03-03 ENCOUNTER — Encounter: Payer: Self-pay | Admitting: Gastroenterology

## 2018-03-03 ENCOUNTER — Ambulatory Visit (INDEPENDENT_AMBULATORY_CARE_PROVIDER_SITE_OTHER): Payer: Medicare Other | Admitting: Gastroenterology

## 2018-03-03 VITALS — BP 98/52 | HR 60 | Ht <= 58 in | Wt 151.0 lb

## 2018-03-03 DIAGNOSIS — K588 Other irritable bowel syndrome: Secondary | ICD-10-CM

## 2018-03-03 DIAGNOSIS — R197 Diarrhea, unspecified: Secondary | ICD-10-CM

## 2018-03-03 DIAGNOSIS — K5909 Other constipation: Secondary | ICD-10-CM

## 2018-03-03 NOTE — Patient Instructions (Addendum)
Take Benefiber 1 tablespoon three times a day with meals  Use Miralax 1/2 capful daily as needed  Take IB Gard 1 capsule three times a day as needed  AVOID high fructose, corn syrup and  artificial sweeteners     Lactose-Free Diet, Adult If you have lactose intolerance, you are not able to digest lactose. Lactose is a natural sugar found mainly in milk and milk products. You may need to avoid all foods and beverages that contain lactose. A lactose-free diet can help you do this. What do I need to know about this diet?  Do not consume foods, beverages, vitamins, minerals, or medicines with lactose. Read ingredients lists carefully.  Look for the words "lactose-free" on labels.  Use lactase enzyme drops or tablets as directed by your health care provider.  Use lactose-free milk or a milk alternative, such as soy milk, for drinking and cooking.  Make sure you get enough calcium and vitamin D in your diet. A lactose-free eating plan can be lacking in these important nutrients.  Take calcium and vitamin D supplements as directed by your health care provider. Talk to your provider about supplements if you are not able to get enough calcium and vitamin D from food. Which foods have lactose? Lactose is found in:  Milk and foods made from milk.  Yogurt.  Cheese.  Butter.  Margarine.  Sour cream.  Cream.  Whipped toppings and nondairy creamers.  Ice cream and other milk-based desserts.  Lactose is also found in foods or products made with milk or milk ingredients. To find out whether a food contains milk or a milk ingredient, look at the ingredients list. Avoid foods with the statement "May contain milk" and foods that contain:  Butter.  Cream.  Milk.  Milk solids.  Milk powder.  Whey.  Curd.  Caseinate.  Lactose.  Lactalbumin.  Lactoglobulin.  What are some alternatives to milk and foods made with milk products?  Lactose-free milk.  Soy milk with added  calcium and vitamin D.  Almond, coconut, or rice milk with added calcium and vitamin D. Note that these are low in protein.  Soy products, such as soy yogurt, soy cheese, soy ice cream, and soy-based sour cream. Which foods can I eat? Grains Breads and rolls made without milk, such as [place], [place], or [place] bread, bagels, pita, and [employer]. Corn tortillas, corn meal, grits, and polenta. Crackers without lactose or milk solids, such as soda crackers and graham crackers. Cooked or dry cereals without lactose or milk solids. Pasta, quinoa, couscous, barley, oats, bulgur, farro, rice, wild rice, or other grains prepared without milk or lactose. Plain popcorn. Vegetables Fresh, frozen, and canned vegetables without cheese, cream, or butter sauces. Fruits All fresh, canned, frozen, or dried fruits that are not processed with lactose. Meats and Other Protein Sources Plain beef, chicken, fish, [place], lamb, veal, pork, wild game, or ham. Kosher-prepared meat products. Strained or junior meats that do not contain milk. Eggs. Soy meat substitutes. Beans, lentils, and hummus. Tofu. Nuts and seeds. Peanut or other nut butters without lactose. Soups, casseroles, and mixed dishes without cheese, cream, or milk. Dairy Lactose-free milk. Soy, rice, or almond milk with added calcium and vitamin D. Soy cheese and yogurt. Beverages Carbonated drinks. Tea. Coffee, freeze-dried coffee, and some instant coffees. Fruit and vegetable juices. Condiments Soy sauce. Carob powder. Olives. Gravy made with water. Baker's cocoa. Rosita Fire. Pure seasonings and spices. Ketchup. Mustard. Bouillon. Broth. Sweets and Desserts Water and fruit ices. Gelatin.  Cookies, pies, or cakes made from allowed ingredients, such as angel food cake. Pudding made with water or a milk substitute. Lactose-free tofu desserts. Soy, coconut milk, or rice-milk-based frozen desserts. Sugar. Honey. Jam, jelly, and marmalade. Molasses. Pure sugar candy.  Dark chocolate without milk. Marshmallows. Fats and Oils Margarines and salad dressings that do not contain milk. Tomasa BlaseBacon. Vegetable oils. Shortening. Mayonnaise. Soy or coconut-based cream. The items listed above may not be a complete list of recommended foods or beverages. Contact your dietitian for more options. Which foods are not recommended? Grains Breads and rolls that contain milk. Toaster pastries. Muffins, biscuits, waffles, cornbread, and pancakes. These can be prepared at home, commercial, or from mixes. Sweet rolls, donuts, English muffins, fry bread, lefse, flour tortillas with lactose, or [place] toast made with milk or milk ingredients. Crackers that contain lactose. Corn curls. Cooked or dry cereals with lactose. Vegetables Creamed or breaded vegetables. Vegetables in a cheese or butter sauce or with lactose-containing margarines. Instant potatoes. [place] fries. Scalloped or au gratin potatoes. Fruits None. Meats and Other Protein Sources Scrambled eggs, omelets, and souffles that contain milk. Creamed or breaded meat, fish, chicken, or [place]. Sausage products, such as wieners and liver sausage. Cold cuts that contain milk solids. Cheese, cottage cheese, ricotta cheese, and cheese spreads. Lasagna and macaroni and cheese. Pizza. Peanut or other nut butters with added milk solids. Casseroles or mixed dishes containing milk or cheese. Dairy All dairy products, including milk, goat's milk, buttermilk, kefir, acidophilus milk, flavored milk, evaporated milk, condensed milk, dulce de Bradenleche, eggnog, yogurt, cheese, and cheese spreads. Beverages Hot chocolate. Cocoa with lactose. Instant iced teas. Powdered fruit drinks. Smoothies made with milk or yogurt. Condiments Chewing gum that has lactose. Cocoa that has lactose. Spice blends if they contain milk products. Artificial sweeteners that contain lactose. Nondairy creamers. Sweets and Desserts Ice cream, ice milk, gelato, sherbet, and  frozen yogurt. Custard, pudding, and mousse. Cake, cream pies, cookies, and other desserts containing milk, cream, cream cheese, or milk chocolate. Pie crust made with milk-containing margarine or butter. Reduced-calorie desserts made with a sugar substitute that contains lactose. Toffee and butterscotch. Milk, white, or dark chocolate that contains milk. Fudge. Caramel. Fats and Oils Margarines and salad dressings that contain milk or cheese. Cream. Half and half. Cream cheese. Sour cream. Chip dips made with sour cream or yogurt. The items listed above may not be a complete list of foods and beverages to avoid. Contact your dietitian for more information. Am I getting enough calcium? Calcium is found in many foods that contain lactose and is important for bone health. The amount of calcium you need depends on your age:  Adults younger than 50 years: 1000 mg of calcium a day.  Adults older than 50 years: 1200 mg of calcium a day.  If you are not getting enough calcium, other calcium sources include:  Orange juice with calcium added. There are 300-350 mg of calcium in 1 cup of orange juice.  Sardines with edible bones. There are 325 mg of calcium in 3 oz of sardines.  Calcium-fortified soy milk. There are 300-400 mg of calcium in 1 cup of calcium-fortified soy milk.  Calcium-fortified rice or almond milk. There are 300 mg of calcium in 1 cup of calcium-fortified rice or almond milk.  Canned salmon with edible bones. There are 180 mg of calcium in 3 oz of canned salmon with edible bones.  Calcium-fortified breakfast cereals. There are [phone] mg of calcium in calcium-fortified breakfast cereals.  Tofu set with calcium sulfate. There are 250 mg of calcium in  cup of tofu set with calcium sulfate.  Spinach, cooked. There are 145 mg of calcium in  cup of cooked spinach.  Edamame, cooked. There are 130 mg of calcium in  cup of cooked edamame.  Collard greens, cooked. There are 125 mg of  calcium in  cup of cooked collard greens.  Kale, frozen or cooked. There are 90 mg of calcium in  cup of cooked or frozen kale.  Almonds. There are 95 mg of calcium in  cup of almonds.  Broccoli, cooked. There are 60 mg of calcium in 1 cup of cooked broccoli.  This information is not intended to replace advice given to you by your health care provider. Make sure you discuss any questions you have with your health care provider. Document Released: 01/09/2002 Document Revised: 12/26/2015 Document Reviewed: 10/20/2013 Elsevier Interactive Patient Education  2018 [employer].   Thank you for choosing [facility]  Kavitha Nandigam,MD

## 2018-03-04 NOTE — Progress Notes (Signed)
Kaitlyn Powell    [id number]    26-Dec-1955  Primary Care Physician:Bouska, Onalee Hua, MD  Referring Physician: Tracey Harries, MD [address] [place], [place] [address]  Chief complaint: Abdominal pain  HPI:  62 year old female with history of Down syndrome is here for follow-up visit accompanied by her sister. Patient has been complaining about lower abdominal pain for past month.  She had ultrasound abdomen and also CT abdomen and pelvis both were unremarkable.  Labs unremarkable except for mild elevation in lipase.  She was treated for UTI.  Appetite is normal.  She has been having alternating constipation and diarrhea with episodes of fecal incontinence.  Is currently taking MiraLAX and senna as needed.  No nausea, vomiting, loss of appetite, melena or blood per rectum   Outpatient Encounter Medications as of 03/03/2018  Medication Sig  . citalopram (CELEXA) 20 MG tablet Take 20 mg by mouth daily.  . cyanocobalamin (TH VITAMIN B12) 100 MCG tablet Take 100 mcg by mouth every morning.   . folic acid (FOLVITE) 1 MG tablet Take 1 mg  by mouth  daily  . ipratropium-albuterol (DUONEB) 0.5-2.5 (3) MG/3ML SOLN Inhale 3 mLs into the lungs every 6 (six) hours as needed (shortness of breath).   . levETIRAcetam (KEPPRA XR) 500 MG 24 hr tablet Take 5 tablets (2,500 mg total) by mouth daily. (Patient taking differently: Take 1,000 mg by mouth at bedtime. )  . levothyroxine (SYNTHROID, LEVOTHROID) 150 MCG tablet Take 150 mcg by mouth daily before breakfast.   . Melatonin 1 MG TABS Take 1 mg by mouth at bedtime.   . mometasone (NASONEX) 50 MCG/ACT nasal spray Place 2 sprays into the nose at bedtime as needed (allergies).  . OXYGEN Inhale 3 L into the lungs as directed. With CPAP   . phenytoin (DILANTIN) 200 MG ER capsule Take 1 capsule (200 mg total) by mouth 2 (two) times daily.  . risperiDONE (RISPERDAL) 1 MG tablet Take 1 mg by mouth every morning and 2 mg at night  .  simvastatin (ZOCOR) 40 MG tablet Take 40 mg  by mouth  at bedtime  . sodium chloride (MURO 128) 2 % ophthalmic solution Place 1 drop into both eyes at bedtime.   No facility-administered encounter medications on file as of 03/03/2018.     Allergies as of 03/03/2018 - Review Complete 03/03/2018  Allergen Reaction Noted  . Cephalexin Other (See Comments) 06/04/2016    Past Medical History:  Diagnosis Date  . Arthritis   . Aspiration pneumonia (HCC)   . Asthma   . Cardiac arrhythmia    have pacemeaker  . Down syndrome   . GERD (gastroesophageal reflux disease)   . Heart block   . Hypothyroidism   . Mental retardation   . Pacemaker   . Pulmonary emboli (HCC)   . Seizures (HCC)   . Sleep apnea with use of continuous positive airway pressure (CPAP)     Past Surgical History:  Procedure Laterality Date  . PACEMAKER INSERTION  12/09/2015   [employer] Accolade MRI EL PPM implanted in PA for complete heart block and syncope  . PEG TUBE PLACEMENT     removed in 2017  . TIBIA FRACTURE SURGERY Right     Family History  Problem Relation Age of Onset  . Diabetes Father   . Heart disease Father   . Breast cancer Sister     Social History  Socioeconomic History  . Marital status: Single    Spouse name: Not on file  . Number of children: Not on file  . Years of education: Not on file  . Highest education level: Not on file  Occupational History  . Occupation: disabled  Social Needs  . Financial resource strain: Not on file  . Food insecurity:    Worry: Not on file    Inability: Not on file  . Transportation needs:    Medical: Not on file    Non-medical: Not on file  Tobacco Use  . Smoking status: Never Smoker  . Smokeless tobacco: Never Used  Substance and Sexual Activity  . Alcohol use: No  . Drug use: No  . Sexual activity: Not on file  Lifestyle  . Physical activity:    Days per week: Not on file    Minutes per session: Not on file  . Stress: Not on  file  Relationships  . Social connections:    Talks on phone: Not on file    Gets together: Not on file    Attends religious service: Not on file    Active member of club or organization: Not on file    Attends meetings of clubs or organizations: Not on file    Relationship status: Not on file  . Intimate partner violence:    Fear of current or ex partner: Not on file    Emotionally abused: Not on file    Physically abused: Not on file    Forced sexual activity: Not on file  Other Topics Concern  . Not on file  Social History Narrative   LI[facility] in group home       Review of systems: Review of Systems  Constitutional: Negative for fever and chills.  HENT: Negative.   Eyes: Negative for blurred vision.  Respiratory: Negative for cough, shortness of breath and wheezing.   Cardiovascular: Negative for chest pain and palpitations.  Gastrointestinal: as per HPI Genitourinary: Negative for dysuria, urgency, frequency and hematuria.  Musculoskeletal: Positive for myalgias, back pain and joint pain.  Skin: Negative for itching and rash.  Neurological: Negative for dizziness, tremors, focal weakness, seizures and loss of consciousness.  Endo/Heme/Allergies: Positive for seasonal allergies.  Psychiatric/Behavioral: Negative for depression, suicidal ideas and hallucinations.  All other systems reviewed and are negative.   Physical Exam: Vitals:   03/03/18 1005  BP: (!) 98/52  Pulse: 60  SpO2: 97%   Body mass index is 36.41 kg/m. Gen:      No acute distress HEENT:  EOMI, sclera anicteric Neck:     No masses; no thyromegaly Lungs:    Clear to auscultation bilaterally; normal respiratory effort CV:         Regular rate and rhythm; no murmurs Abd:      + bowel sounds; soft, non-tender; no palpable masses, no distension Ext:    No edema; adequate peripheral perfusion Skin:      Warm and dry; no rash Neuro: alert and oriented x 3 Psych: normal mood and affect  Data  Reviewed:  Reviewed labs, radiology imaging, old records and pertinent past GI work up   Assessment and Plan/Recommendations:  62 year old female with history of Down syndrome, hypothyroidism, obstructive sleep apnea, sick sinus syndrome status post pacemaker and, seizure disorder accompanied by her sister who is primary caregiver  Constipation: Increase fluid intake Benefiber 1 teaspoon 3 times daily with meals MiraLAX half capful daily as needed with goal 1-2 soft bowel movements daily  Intermittent diarrhea with fecal urgency and incontinence Trial of lactose-free diet Avoid drinks with high fructose corn syrup or artificial sweeteners Okay to use low-dose Imodium as needed  Mild elevation in lipase with no clinical signs of significant pancreatitis On review of the labs she had mild elevation of lipase in the past when [place] was [place]  Lower abdominal pain: CT abdomen pelvis and ultrasound unremarkable with no significant pathology Could be secondary to irritable bowel syndrome with chronic constipation We will do a trial of IBgard 1 capsule up to 3 times daily  25 minutes was spent face-to-face with the patient. Greater than 50% of the time used for counseling as well as treatment plan and follow-up.   Iona BeardK. Veena Ailyn Gladd , MD [phone]    CC: Tracey HarriesBouska, David, MD

## 2018-03-09 ENCOUNTER — Encounter: Payer: Self-pay | Admitting: Gastroenterology

## 2018-04-13 ENCOUNTER — Ambulatory Visit (INDEPENDENT_AMBULATORY_CARE_PROVIDER_SITE_OTHER): Payer: Medicare Other | Admitting: Adult Health

## 2018-04-13 ENCOUNTER — Encounter: Payer: Self-pay | Admitting: Adult Health

## 2018-04-13 VITALS — BP 129/66 | HR 80 | Resp 22 | Ht <= 58 in | Wt 156.5 lb

## 2018-04-13 DIAGNOSIS — R413 Other amnesia: Secondary | ICD-10-CM | POA: Diagnosis not present

## 2018-04-13 DIAGNOSIS — R569 Unspecified convulsions: Secondary | ICD-10-CM

## 2018-04-13 DIAGNOSIS — Z5181 Encounter for therapeutic drug level monitoring: Secondary | ICD-10-CM

## 2018-04-13 MED ORDER — LEVETIRACETAM ER 500 MG PO TB24: 1500.0000 mg | ORAL_TABLET | Freq: Every day | ORAL | 3 refills | Status: DC

## 2018-04-13 NOTE — Progress Notes (Signed)
PATIENT: Kaitlyn Powell DOB: Mar 23, 1956  REASON FOR VISIT: follow up HISTORY FROM: patient  HISTORY OF PRESENT ILLNESS: Kaitlyn Srey Stokesis a 62 years old left-handed female, accompanied by her sister Kaitlyn Powell, who is also her power of attorney, seen in refer by her primary care physician [name], to evaluate for seizure, initial evaluation was on January 11 2017.  I reviewed and summarized the referring and most recent hospital discharge from [facility] in September 2017, she had a history of Down syndrome, asthma, obstructive sleep apnea, on CPAP at night, hypothyroidism, seizure, sick sinus syndrome status post pacemaker, she was admitted to the hospital at [facility] on March 12 2016 with dyspnea and wheezing, was noted to have low-grade fever 37.6, oxygen saturation was 82%, chest x-ray showed Ldisease, she was treated with Zosyn and Vancomyocin, CT angiograms demonstrate small feeding defect in left lower lobe pulmonary artery, suspicious for PE, she was treated with IV heparin, was also seen for possible congestive heart failure, required ICU admission, daytime BiPAP, she had prolonged ICU stay in for 16 days, she was able to eventually wean off BiPAP, modified swallowing test confirmed swallowing dysfunction with silent aspiration, she was put on NG tube, later switched to Coumadin due to PE, but INR was difficult to stabilize in the therapeutic window,   Her acute hypoxic respiratory failure was thought due to the combination of pulmonary emboli, obstructive sleep apnea, congestive heart failure exacerbation and pneumonia, she was also treated with a short course of Solu-Medrol, echocardiogram August 11 showed ejection fraction of 60-65%, pulmonary hypertension likely secondary to diastolic heart failure, possible contribution from PE and L space disease,  She was seen by hematologist, who suggested 3 months course of  anticoagulation with Lovenox, she had prolonged rehabilitation since April 2017, regained significant recovery, she also noted to have thrombocytopenia, had PEG tube placement, this was placed on April 17 2016 without complication, postprocedure she had mild to moderate generalized abdominal pain, extensive evaluations, including normal or negative UA, hepatic panel, abdominal x-ray, she was able to tolerate the tube feeding well, next  Laboratory evaluations in September 2017, WBC 5.9, hemoglobin of 9.0, creatinine of 0.88,  She also had a lifelong history of seizure, previously was treated with Dilantin, macrocytic anemia with normal B12 level, considered due to prolonged use of Dilantin, there was a concern of interaction between Dilantin and other long-term by mouth anticoagulation treatment such as Coumadin and [name], she was discharged with Lovenox subcutaneous injection, PEG tube was removed in Nov 2017, she is on a mechanical soft diet, thickened liquid, she began to progress to small bite, able to take by mouth's by mouth  Most recent laboratory evaluation in May 2018, hemoglobin 10 point 7, normal CMP, creatinine of 0.96, normal liver functional test, slight elevated d-dimer 1.01  She is currently taking Dilantin 200 mg twice a day, she lives with her sister's family, she has stopped the Lovenox since November 2017,  She suffered seizure all her life, drop seizures, she is now having on daily basis, she had sudden onset of lost muscle tone, loss consciousness, lasting less than 1 minute,   UPDATE February 17 2017: She is accompanied by her sister at today's clinical visit, following last visit on January 11 2017, in attempt to change her antiepileptic medications tapering off the Dilantin, titrating up Keppra, she suffered significant complications,  While taking Keppra 500 mg twice a day, decrease Dilantin  to 200 mg every night, she suffered generalized tonic-clonic seizure, with  total of 6 generalized tonic-clonic seizure since, the last one was on January 28 2017, Dilantin 200 mg twice a day was reintroduced, she is now on lower dose of Keppra 500 mg twice a day,  She still drowsy, especially after morning dose of medication, she no longer has recurrent generalized tonic-clonic seizure, only had one drop attack with current combinations,  Before that last generalized seizure was in October 2017, she was also noted to be emotional.   We have personally reviewed MRI of the brain without contrast in July 2018: Congenital microcephaly consistent with her history of Down syndrome, moderate parenchymal brain volume loss, moderate small vessel disease  EEG showed moderate to severe background slowing, rare generalized spike slow waves,  UPDATE March 18 2017: she is now taking Dilantin ER 200 mg 1 tablet twice a day, and keppra 500 mg twice a day  sister Reported drop attacks, sudden loss of muscle tone, lasting for a few second, no body jerking movement, but only happened while she was sitting on the toilet straining, she had 4 spells over past 2 weeks,  Her sister reported that adding Keppra has decreased the frequency of those spells, but I am not sure those could represent vasovagal syncope versus seizures  UPDATE 12/14/17:  3-day video EEG in December 2018 showed that she has 6 events logged.  On event #3 there was some mild frontal slowing that was bisynchronous.  Event #6 was accompanied by generalized frontal predominant 2 Hz spike and wave activity lasting 17 seconds with some after coming slow waves.  The patient was not on video for these events.  Patient is now taking Keppra XR 500 mg 5 tablets every night Dilantin 200 mg twice a day  She has no recurrent generalized seizure, but continue have spells of sudden loss of consciousness, went limp, it happened most open while she sits on toilet, lasting for less than couple minutes, no self injury, but it  happened multiple times each week, sometimes couple times each day  She was also noted to have increased confusion, she is also on polypharmacy treatment, citalopram 20 mg every day, Risperdal 1 mg 1/2  UPDATE Dec 14 2017: She is now taking keppra 500mg  2 tabs qhs, Dilantin 200mg  bid, she could not tolerate  lamotrigine, even at 25 mg daily,  She still have spells of sudden head drop on the toilet  Today 04/13/18  Kaitlyn Powell is a 62 year old female with a history of seizures and dementia.  She returns today for follow-up.  Her daughter reports that she was doing well but for the last 3 months she began having episodes while she is on the toilet again.  She states that most of the episodes she just goes limp for several seconds.  She states there has been 2 occasions where she had mild convulsing only for seconds and then she was back to her baseline.  She reports that she has been more confused in the morning.  She states over the weekend they increased Keppra XR to 1500 mg daily.  She states that she has been a little more lethargic in the mornings but she contributes this to the increase in medication.  She states that she did not have an event yesterday and prior to the increase in Keppra she was having daily events.  She continues on Dilantin 200 mg twice a day.   REVIEW OF SYSTEMS: Out of a  complete 14 system review of symptoms, the patient complains only of the following symptoms, and all other reviewed systems are negative.  See HPI  ALLERGIES: Allergies  Allergen Reactions  . Cephalexin Other (See Comments)    Seizures     HOME MEDICATIONS: Outpatient Medications Prior to Visit  Medication Sig Dispense Refill  . citalopram (CELEXA) 20 MG tablet Take 20 mg by mouth daily.    . cyanocobalamin (TH VITAMIN B12) 100 MCG tablet Take 100 mcg by mouth every morning.     . folic acid (FOLVITE) 1 MG tablet Take 1 mg  by mouth  daily    . ipratropium-albuterol (DUONEB) 0.5-2.5 (3)  MG/3ML SOLN Inhale 3 mLs into the lungs every 6 (six) hours as needed (shortness of breath).     . levETIRAcetam (KEPPRA XR) 500 MG 24 hr tablet Take 5 tablets (2,500 mg total) by mouth daily. (Patient taking differently: Take 1,000 mg by mouth at bedtime. ) 150 tablet 11  . levothyroxine (SYNTHROID, LEVOTHROID) 150 MCG tablet Take 150 mcg by mouth daily before breakfast.     . Melatonin 1 MG TABS Take 1 mg by mouth at bedtime.     . mometasone (NASONEX) 50 MCG/ACT nasal spray Place 2 sprays into the nose at bedtime as needed (allergies).    . OXYGEN Inhale 3 L into the lungs as directed. With CPAP     . phenytoin (DILANTIN) 200 MG ER capsule Take 1 capsule (200 mg total) by mouth 2 (two) times daily. 60 capsule 5  . risperiDONE (RISPERDAL) 1 MG tablet Take 1 mg by mouth every morning and 2 mg at night    . simvastatin (ZOCOR) 40 MG tablet Take 40 mg  by mouth  at bedtime    . sodium chloride (MURO 128) 2 % ophthalmic solution Place 1 drop into both eyes at bedtime.     No facility-administered medications prior to visit.     PAST MEDICAL HISTORY: Past Medical History:  Diagnosis Date  . Arthritis   . Aspiration pneumonia (HCC)   . Asthma   . Cardiac arrhythmia    have pacemeaker  . Down syndrome   . GERD (gastroesophageal reflux disease)   . Heart block   . Hypothyroidism   . Mental retardation   . Pacemaker   . Pulmonary emboli (HCC)   . Seizures (HCC)   . Sleep apnea with use of continuous positive airway pressure (CPAP)     PAST SURGICAL HISTORY: Past Surgical History:  Procedure Laterality Date  . PACEMAKER INSERTION  12/09/2015   [employer] Accolade MRI EL PPM implanted in PA for complete heart block and syncope  . PEG TUBE PLACEMENT     removed in 2017  . TIBIA FRACTURE SURGERY Right     FAMILY HISTORY: Family History  Problem Relation Age of Onset  . Diabetes Father   . Heart disease Father   . Breast cancer Sister     SOCIAL HISTORY: Social History     Socioeconomic History  . Marital status: Single    Spouse name: Not on file  . Number of children: Not on file  . Years of education: Not on file  . Highest education level: Not on file  Occupational History  . Occupation: disabled  Social Needs  . Financial resource strain: Not on file  . Food insecurity:    Worry: Not on file    Inability: Not on file  . Transportation needs:    Medical: Not  on file    Non-medical: Not on file  Tobacco Use  . Smoking status: Never Smoker  . Smokeless tobacco: Never Used  Substance and Sexual Activity  . Alcohol use: No  . Drug use: No  . Sexual activity: Not on file  Lifestyle  . Physical activity:    Days per week: Not on file    Minutes per session: Not on file  . Stress: Not on file  Relationships  . Social connections:    Talks on phone: Not on file    Gets together: Not on file    Attends religious service: Not on file    Active member of club or organization: Not on file    Attends meetings of clubs or organizations: Not on file    Relationship status: Not on file  . Intimate partner violence:    Fear of current or ex partner: Not on file    Emotionally abused: Not on file    Physically abused: Not on file    Forced sexual activity: Not on file  Other Topics Concern  . Not on file  Social History Narrative   LIves in group home       PHYSICAL EXAM  Vitals:   04/13/18 1104  BP: 129/66  Pulse: 80  Resp: (!) 22  Weight: 156 lb 8 oz (71 kg)  Height: 4\' 6"  (1.372 m)   Body mass index is 37.73 kg/m.  Generalized: Well developed, in no acute distress   Neurological examination  Mentation: Alert. Follows all commands  Cranial nerve II-XII:  Extraocular movements were full, visual field were full on confrontational test. Facial sensation and strength were normal.. Head turning and shoulder shrug  were normal and symmetric. Motor: The motor testing reveals 5 over 5 strength of all 4 extremities. Good symmetric  motor tone is noted throughout.  Sensory: Sensory testing is intact to soft touch on all 4 extremities. No evidence of extinction is noted.  Coordination: Cerebellar testing reveals good finger-nose-finger and heel-to-shin bilaterally.  Trouble with following directions Gait and station: Gait is unsteady.    DIAGNOSTIC DATA (LABS, IMAGING, TESTING) - I reviewed patient records, labs, notes, testing and imaging myself where available.  Lab Results  Component Value Date   WBC 5.0 02/06/2018   HGB 12.9 02/06/2018   HCT 37.0 02/06/2018   MCV 99.2 02/06/2018   PLT 157 02/06/2018      Component Value Date/Time   NA 128 (L) 02/06/2018 1833   NA 138 05/26/2017 1141   K 4.5 02/06/2018 1833   CL 89 (L) 02/06/2018 1833   CO2 28 02/06/2018 1833   GLUCOSE 100 (H) 02/06/2018 1833   BUN 16 02/06/2018 1833   BUN 23 05/26/2017 1141   CREATININE 0.76 02/06/2018 1833   CALCIUM 9.1 02/06/2018 1833   PROT 8.3 (H) 02/06/2018 1833   PROT 7.6 01/10/2018 0819   ALBUMIN 3.3 (L) 02/06/2018 1833   ALBUMIN 3.7 01/10/2018 0819   AST 22 02/06/2018 1833   ALT 12 02/06/2018 1833   ALKPHOS 83 02/06/2018 1833   BILITOT 0.5 02/06/2018 1833   BILITOT 0.2 01/10/2018 0819   GFRNONAA >60 02/06/2018 1833   GFRAA >60 02/06/2018 1833   Lab Results  Component Value Date   CHOL 176 01/10/2018   HDL 98 01/10/2018   LDLCALC 68 01/10/2018   TRIG 50 01/10/2018   CHOLHDL 1.8 01/10/2018      ASSESSMENT AND PLAN 62 y.o. year old female  has a past  medical history of Arthritis, Aspiration pneumonia (HCC), Asthma, Cardiac arrhythmia, Down syndrome, GERD (gastroesophageal reflux disease), Heart block, Hypothyroidism, Mental retardation, Pacemaker, Pulmonary emboli (HCC), Seizures (HCC), and Sleep apnea with use of continuous positive airway pressure (CPAP). here with:  1.  Seizures 2.  Dementia  The patient will continue on Keppra XR 1500 mg at bedtime.  If she continues to have frequent events they should let us  know.  We may consider adding on Lamictal.  The patient will continue on Dilantin 200 mg twice a day.  I will check blood work today.  If her symptoms worsen or she develops new symptoms she should let us know.  She will follow-up in 6 months or sooner if needed.    Butch Penny, MSN, NP-C 04/13/2018, 11:04 AM [facility] [address], [address] [place], [place] [address] [phone]

## 2018-04-13 NOTE — Patient Instructions (Signed)
Your Plan:  Continue Keppra XR 1500 mg at bedtime  Continue Dilantin 200 mg twice a day Blood work today If seizures do not improve with the increase in keppra please let us know If your symptoms worsen or you develop new symptoms please let us know.   Thank you for coming to see [place] at [facility]. I hope we have been able to provide you high quality care today.  You may receive a patient satisfaction survey over the next few weeks. We would appreciate your feedback and comments so that we may continue to improve ourselves and the health of our patients.

## 2018-04-14 ENCOUNTER — Other Ambulatory Visit: Payer: Medicare Other | Admitting: *Deleted

## 2018-04-14 DIAGNOSIS — E785 Hyperlipidemia, unspecified: Secondary | ICD-10-CM

## 2018-04-14 LAB — LIPID PANEL
CHOL/HDL RATIO: 2.5 ratio (ref 0.0–4.4)
CHOLESTEROL TOTAL: 264 mg/dL — AB (ref 100–199)
HDL: 107 mg/dL (ref >39)
LDL CALC: 143 mg/dL — AB (ref 0–99)
Triglycerides: 70 mg/dL (ref 0–149)
VLDL CHOLESTEROL CAL: 14 mg/dL (ref 5–40)

## 2018-04-15 LAB — CBC WITH DIFFERENTIAL/PLATELET
Basophils Absolute: 0.1 10*3/uL (ref 0.0–0.2)
Basos: 1 %
EOS (ABSOLUTE): 0.1 10*3/uL (ref 0.0–0.4)
EOS: 1 %
Hematocrit: 35.9 % (ref 34.0–46.6)
Hemoglobin: 12.1 g/dL (ref 11.1–15.9)
IMMATURE GRANS (ABS): 0.1 10*3/uL (ref 0.0–0.1)
IMMATURE GRANULOCYTES: 1 %
LYMPHS: 15 %
Lymphocytes Absolute: 1 10*3/uL (ref 0.7–3.1)
MCH: 34.3 pg — ABNORMAL HIGH (ref 26.6–33.0)
MCHC: 33.7 g/dL (ref 31.5–35.7)
MCV: 102 fL — ABNORMAL HIGH (ref 79–97)
MONOS ABS: 0.6 10*3/uL (ref 0.1–0.9)
Monocytes: 10 %
NEUTROS PCT: 72 %
Neutrophils Absolute: 4.9 10*3/uL (ref 1.4–7.0)
PLATELETS: 149 10*3/uL — AB (ref 150–450)
RBC: 3.53 x10E6/uL — AB (ref 3.77–5.28)
RDW: 13 % (ref 12.3–15.4)
WBC: 6.7 10*3/uL (ref 3.4–10.8)

## 2018-04-15 LAB — COMPREHENSIVE METABOLIC PANEL
ALK PHOS: 87 IU/L (ref 39–117)
ALT: 13 IU/L (ref 0–32)
AST: 16 IU/L (ref 0–40)
Albumin/Globulin Ratio: 1 — ABNORMAL LOW (ref 1.2–2.2)
Albumin: 3.8 g/dL (ref 3.6–4.8)
BUN/Creatinine Ratio: 22 (ref 12–28)
BUN: 17 mg/dL (ref 8–27)
Bilirubin Total: <0.2 mg/dL (ref 0.0–1.2)
CALCIUM: 9.2 mg/dL (ref 8.7–10.3)
CO2: 25 mmol/L (ref 20–29)
Chloride: 94 mmol/L — ABNORMAL LOW (ref 96–106)
Creatinine, Ser: 0.79 mg/dL (ref 0.57–1.00)
GFR calc Af Amer: 93 mL/min/{1.73_m2} (ref >59)
GFR calc non Af Amer: 81 mL/min/{1.73_m2} (ref >59)
GLOBULIN, TOTAL: 3.7 g/dL (ref 1.5–4.5)
Glucose: 93 mg/dL (ref 65–99)
Potassium: 4.9 mmol/L (ref 3.5–5.2)
SODIUM: 132 mmol/L — AB (ref 134–144)
Total Protein: 7.5 g/dL (ref 6.0–8.5)

## 2018-04-15 LAB — PHENYTOIN LEVEL, TOTAL: Phenytoin (Dilantin), Serum: 10.9 ug/mL (ref 10.0–20.0)

## 2018-04-15 LAB — LEVETIRACETAM LEVEL: LEVETIRACETAM: 32.9 ug/mL (ref 10.0–40.0)

## 2018-04-18 ENCOUNTER — Telehealth: Payer: Self-pay | Admitting: *Deleted

## 2018-04-18 ENCOUNTER — Ambulatory Visit (INDEPENDENT_AMBULATORY_CARE_PROVIDER_SITE_OTHER): Payer: Medicare Other | Admitting: *Deleted

## 2018-04-18 DIAGNOSIS — I442 Atrioventricular block, complete: Secondary | ICD-10-CM

## 2018-04-18 NOTE — Telephone Encounter (Signed)
Spoke with patient's sister, [name], on DPR [facility] and informed her that the patient's lab work is stable with previous lab work.  Advised her dilantin level is in therapeutic range. She verbalized understanding, appreciation.

## 2018-04-18 NOTE — Progress Notes (Signed)
Remote pacemaker transmission.   

## 2018-04-21 ENCOUNTER — Telehealth: Payer: Self-pay

## 2018-04-21 NOTE — Telephone Encounter (Signed)
Left detailed message on POA VM.  Advised Pt cholesterol levels were back up after stopping statin.  Advised to restart statin and repeat lab work.  Requested call back or MyChart message.    Will cont to monitor.

## 2018-04-21 NOTE — Telephone Encounter (Signed)
-----   Message from Hillis RangeJames Allred, MD sent at 04/18/2018  5:22 PM EDT ----- Results reviewed.  Boneta LucksJenny, please inform pt of result. Total cholesterol and LDL are much worse than prior.  Would consider restarting statin.  Could start at half prior dose if patient and family prefer.  Repeat cholesterol 8 weeks after restarting statin.

## 2018-04-22 ENCOUNTER — Other Ambulatory Visit: Payer: Self-pay

## 2018-04-22 ENCOUNTER — Other Ambulatory Visit: Payer: Self-pay | Admitting: Internal Medicine

## 2018-04-22 DIAGNOSIS — E785 Hyperlipidemia, unspecified: Secondary | ICD-10-CM

## 2018-04-22 MED ORDER — SIMVASTATIN 40 MG PO TABS: ORAL_TABLET | ORAL | 2 refills | Status: DC

## 2018-04-22 MED ORDER — SIMVASTATIN 40 MG PO TABS: 40.0000 mg | ORAL_TABLET | Freq: Every day | ORAL | 3 refills | Status: DC

## 2018-04-22 NOTE — Telephone Encounter (Signed)
Pt's medication was resent to pt's pharmacy because the medication had 2 sets of directions. Corrected the Rx and resent it in. Confirmation received.

## 2018-05-13 LAB — CUP PACEART REMOTE DEVICE CHECK
Battery Remaining Longevity: 168 mo
Battery Remaining Percentage: 100 %
Brady Statistic RA Percent Paced: 17 %
Implantable Lead Implant Date: 20170508
Implantable Lead Model: 7741
Implantable Lead Serial Number: 1111
Implantable Pulse Generator Implant Date: 20170508
Lead Channel Impedance Value: 622 Ohm
Lead Channel Impedance Value: 668 Ohm
Lead Channel Pacing Threshold Amplitude: 0.6 V
Lead Channel Setting Pacing Amplitude: 2 V
Lead Channel Setting Pacing Amplitude: 5 V
MDC IDC LEAD IMPLANT DT: 20170508
MDC IDC LEAD LOCATION: 753859
MDC IDC LEAD LOCATION: 753860
MDC IDC LEAD SERIAL: 1111
MDC IDC MSMT LEADCHNL RA PACING THRESHOLD PULSEWIDTH: 0.4 ms
MDC IDC SESS DTM: 20190916063100
MDC IDC SET LEADCHNL RV PACING PULSEWIDTH: 1 ms
MDC IDC SET LEADCHNL RV SENSING SENSITIVITY: 2.5 mV
MDC IDC STAT BRADY RV PERCENT PACED: 1 %
Pulse Gen Serial Number: 747619

## 2018-05-26 ENCOUNTER — Other Ambulatory Visit: Payer: Self-pay

## 2018-05-26 ENCOUNTER — Ambulatory Visit (INDEPENDENT_AMBULATORY_CARE_PROVIDER_SITE_OTHER): Payer: Medicare Other | Admitting: Podiatry

## 2018-05-26 ENCOUNTER — Encounter: Payer: Self-pay | Admitting: Podiatry

## 2018-05-26 DIAGNOSIS — B351 Tinea unguium: Secondary | ICD-10-CM

## 2018-05-26 DIAGNOSIS — M79674 Pain in right toe(s): Secondary | ICD-10-CM

## 2018-05-26 DIAGNOSIS — M79675 Pain in left toe(s): Secondary | ICD-10-CM

## 2018-05-26 NOTE — Progress Notes (Signed)
Subjective: Kaitlyn Powell is a pleasant 62 y.o. Caucasian female who is accompanied by her sister on today.  She is seen for painful mycotic toenails.  Pain is aggravated when wearing enclosed shoe gear and relieved with periodic professional debridement.  Kaitlyn Powell nor her family voice any new pedal concerns on today's visit.  Objective: Vascular Examination: Capillary refill time <3 seconds x 10 digits Dorsalis pedis and posterior tibial pulses present b/l No digital hair x 10 digits Skin temperature warm to warm b/l  Dermatological Examination: Skin with normal turgor, texture and tone b/l Toenails 1-5 b/l discolored, thick, dystrophic with subungual debris and pain with palpation to nailbeds due to thickness of nails.  Musculoskeletal: Muscle strength 5/5 to all LE muscle groups  Neurological: Sensation intact with 10 gram monofilament. Vibratory sensation intact  Assessment: Painful onychomycosis toenails 1-5 b/l   Plan: 1. Toenails 1-5 b/l were debrided in length and girth without iatrogenic bleeding. 2. Patient to continue soft, supportive shoe gear 3. Patient to report any pedal injuries to medical professional immediately. 4. Follow up 3 months. Patient/POA to call should there be a concern in the interim.

## 2018-06-12 ENCOUNTER — Other Ambulatory Visit: Payer: Self-pay | Admitting: Pulmonary Disease

## 2018-06-15 ENCOUNTER — Other Ambulatory Visit: Payer: Self-pay | Admitting: Internal Medicine

## 2018-06-15 LAB — LIPID PANEL
Chol/HDL Ratio: 2 ratio (ref 0.0–4.4)
Cholesterol, Total: 209 mg/dL — ABNORMAL HIGH (ref 100–199)
HDL: 106 mg/dL (ref >39)
LDL CALC: 91 mg/dL (ref 0–99)
Triglycerides: 60 mg/dL (ref 0–149)
VLDL Cholesterol Cal: 12 mg/dL (ref 5–40)

## 2018-06-20 ENCOUNTER — Ambulatory Visit: Payer: Medicare Other | Admitting: Adult Health

## 2018-06-21 ENCOUNTER — Encounter: Payer: Self-pay | Admitting: Adult Health

## 2018-06-21 ENCOUNTER — Ambulatory Visit (INDEPENDENT_AMBULATORY_CARE_PROVIDER_SITE_OTHER): Payer: Medicare Other | Admitting: Adult Health

## 2018-06-21 VITALS — BP 128/67 | HR 71 | Ht <= 58 in | Wt 158.2 lb

## 2018-06-21 DIAGNOSIS — R569 Unspecified convulsions: Secondary | ICD-10-CM | POA: Diagnosis not present

## 2018-06-21 DIAGNOSIS — R410 Disorientation, unspecified: Secondary | ICD-10-CM

## 2018-06-21 DIAGNOSIS — Q909 Down syndrome, unspecified: Secondary | ICD-10-CM

## 2018-06-21 NOTE — Patient Instructions (Signed)
Your Plan:  Continue Keppra and Dilantin Blood work today and UA Consider adding Lamictal If your symptoms worsen or you develop new symptoms please let us know.   [facility]. I hope we have been able to provide you high quality care today.  You may receive a patient satisfaction survey over the next few weeks. We would appreciate your feedback and comments so that we may continue to improve ourselves and the health of our patients.

## 2018-06-21 NOTE — Progress Notes (Signed)
PATIENT: Kaitlyn LIGHTSEY  DOB: 1956/06/18  REASON FOR VISIT: follow up HISTORY FROM: patient  HISTORY OF PRESENT ILLNESS: Today 06/21/18  HISTORY Kaitlyn Molino Stokesis a 62 years old left-handed female, accompanied by her sister [name], who is also her power of attorney, seen in refer by her primary care physician [name], to evaluate for seizure, initial evaluation was on January 11 2017.  I reviewed and summarized the referring and most recent hospital discharge from [facility] in September 2017, she had a history of Down syndrome, asthma, obstructive sleep apnea, on CPAP at night, hypothyroidism, seizure, sick sinus syndrome status post pacemaker, she was admitted to the hospital at [facility] on March 12 2016 with dyspnea and wheezing, was noted to have low-grade fever 37.6, oxygen saturation was 82%, chest x-ray showed Ldisease, she was treated with Zosyn and Vancomyocin, CT angiograms demonstrate small feeding defect in left lower lobe pulmonary artery, suspicious for PE, she was treated with IV heparin, was also seen for possible congestive heart failure, required ICU admission, daytime BiPAP, she had prolonged ICU stay in for 16 days, she was able to eventually wean off BiPAP, modified swallowing test confirmed swallowing dysfunction with silent aspiration, she was put on NG tube, later switched to Coumadin due to PE, but INR was difficult to stabilize in the therapeutic window,   Her acute hypoxic respiratory failure was thought due to the combination of pulmonary emboli, obstructive sleep apnea, congestive heart failure exacerbation and pneumonia, she was also treated with a short course of Solu-Medrol, echocardiogram August 11 showed ejection fraction of 60-65%, pulmonary hypertension likely secondary to diastolic heart failure, possible contribution from PE and L space disease,  She was seen by hematologist, who  suggested 3 months course of anticoagulation with Lovenox, she had prolonged rehabilitation since April 2017, regained significant recovery, she also noted to have thrombocytopenia, had PEG tube placement, this was placed on April 17 2016 without complication, postprocedure she had mild to moderate generalized abdominal pain, extensive evaluations, including normal or negative UA, hepatic panel, abdominal x-ray, she was able to tolerate the tube feeding well, next  Laboratory evaluations in September 2017, WBC 5.9, hemoglobin of 9.0, creatinine of 0.88,  She also had a lifelong history of seizure, previously was treated with Dilantin, macrocytic anemia with normal B12 level, considered due to prolonged use of Dilantin, there was a concern of interaction between Dilantin and other long-term by mouth anticoagulation treatment such as Coumadin and [name], she was discharged with Lovenox subcutaneous injection, PEG tube was removed in Nov 2017, she is on a mechanical soft diet, thickened liquid, she began to progress to small bite, able to take by mouth's by mouth  Most recent laboratory evaluation in May 2018, hemoglobin 10 point 7, normal CMP, creatinine of 0.96, normal liver functional test, slight elevated d-dimer 1.01  She is currently taking Dilantin 200 mg twice a day, she lives with her sister's family, she has stopped the Lovenox since November 2017,  She suffered seizure all her life, drop seizures, she is now having on daily basis, she had sudden onset of lost muscle tone, loss consciousness, lasting less than 1 minute,   UPDATE February 17 2017: She is accompanied by her sister at today's clinical visit, following last visit on January 11 2017, in attempt to change her antiepileptic medications tapering off the Dilantin, titrating up Keppra, she suffered significant complications,  While taking Keppra 500 mg  twice a day, decrease Dilantin to 200 mg every night, she suffered generalized  tonic-clonic seizure, with total of 6 generalized tonic-clonic seizure since, the last one was on January 28 2017, Dilantin 200 mg twice a day was reintroduced, she is now on lower dose of Keppra 500 mg twice a day,  She still drowsy, especially after morning dose of medication, she no longer has recurrent generalized tonic-clonic seizure, only had one drop attack with current combinations,  Before that last generalized seizure was in October 2017, she was also noted to be emotional.   We have personally reviewed MRI of the brain without contrast in July 2018: Congenital microcephaly consistent with her history of Down syndrome, moderate parenchymal brain volume loss, moderate small vessel disease  EEG showed moderate to severe background slowing, rare generalized spike slow waves,  UPDATE March 18 2017: she is now taking Dilantin ER 200 mg 1 tablet twice a day, and keppra 500 mg twice a day  sister Reported drop attacks, sudden loss of muscle tone, lasting for a few second, no body jerking movement, but only happened while she was sitting on the toilet straining, she had 4 spells over past 2 weeks,  Her sister reported that adding Keppra has decreased the frequency of those spells, but I am not sure those could represent vasovagal syncope versus seizures  UPDATE05/14/19: 3-day video EEG in December 2018 showed that she has 6 events logged. On event #3 there was some mild frontal slowing that was bisynchronous. Event #6 was accompanied by generalized frontal predominant 2 Hz spike and wave activity lasting 17 seconds with some after coming slow waves. The patient was not on video for these events.  Patient is now taking Keppra XR 500 mg 5 tablets every night Dilantin 200 mg twice a day  She has no recurrent generalized seizure, but continue have spells of sudden loss of consciousness, went limp, it happened most open while she sits on toilet, lasting for less than couple minutes,  no self injury, but it happened multiple times each week, sometimes couple times each day  She was also noted to have increased confusion, she is also on polypharmacy treatment, citalopram 20 mg every day, Risperdal 1 mg 1/2  UPDATE Dec 14 2017: She is now taking keppra 500mg  2 tabs qhs, Dilantin 200mg  bid,she could not tolerate lamotrigine, even at 25 mg daily,  She still have spells of suddenheaddrop onthetoilet  Update 04/13/18  Kaitlyn Powell is a 62 year old female with a history of seizures and dementia.  She returns today for follow-up.  Her daughter reports that she was doing well but for the last 3 months she began having episodes while she is on the toilet again.  She states that most of the episodes she just goes limp for several seconds.  She states there has been 2 occasions where she had mild convulsing only for seconds and then she was back to her baseline.  She reports that she has been more confused in the morning.  She states over the weekend they increased Keppra XR to 1500 mg daily.  She states that she has been a little more lethargic in the mornings but she contributes this to the increase in medication.  She states that she did not have an event yesterday and prior to the increase in Keppra she was having daily events.  She continues on Dilantin 200 mg twice a day.  Today 06/21/18:  Ms. Tristan Schroeder. is a 62 year old female with a history of  seizures.  She returns today for follow-up.  At the last visit they had increase Keppra XR to 1500 mg daily and they state after  3 weeks she began having daily events.  They reduce her dose back to thousand milligrams.  She seems to tolerate this better in regards to drowsiness.  Her family states that she may go several days with no seizure event and then she will have them daily for several days.  Her seizures continue to occur mainly when she is on the toilet.  They also noted in the last 2 to 3 weeks she has been more confused.  This  is a sudden change.  They state that she has to be prompted to do everything.  Whereas before is if it was  in front of her she knew what to do.  She returns today for an evaluation.    REVIEW OF SYSTEMS: Out of a complete 14 system review of symptoms, the patient complains only of the following symptoms, and all other reviewed systems are negative.  Apnea, confusion, seizure, fatigue, drooling  ALLERGIES: Allergies  Allergen Reactions  . Cephalexin Other (See Comments)    Seizures     HOME MEDICATIONS: Outpatient Medications Prior to Visit  Medication Sig Dispense Refill  . citalopram (CELEXA) 20 MG tablet Take 20 mg by mouth daily.    . cyanocobalamin (TH VITAMIN B12) 100 MCG tablet Take 100 mcg by mouth every morning.     . fluticasone (FLONASE) 50 MCG/ACT nasal spray PLACE 2 SPRAYS DAILY INTO BOTH NOSTRILS.  11  . fluticasone (FLONASE) 50 MCG/ACT nasal spray PLACE 2 SPRAYS DAILY INTO BOTH NOSTRILS. 16 g 3  . folic acid (FOLVITE) 1 MG tablet Take 1 mg  by mouth  daily    . ipratropium-albuterol (DUONEB) 0.5-2.5 (3) MG/3ML SOLN Inhale 3 mLs into the lungs every 6 (six) hours as needed (shortness of breath).     . levETIRAcetam (KEPPRA XR) 500 MG 24 hr tablet Take 3 tablets (1,500 mg total) by mouth daily. 270 tablet 3  . levothyroxine (SYNTHROID, LEVOTHROID) 150 MCG tablet Take 150 mcg by mouth daily before breakfast.     . Melatonin 1 MG TABS Take 1 mg by mouth at bedtime.     . meloxicam (MOBIC) 15 MG tablet TAKE ONE TABLET (15 MG DOSE) BY MOUTH DAILY.  2  . memantine (NAMENDA) 10 MG tablet Take 10 mg by mouth 2 (two) times daily.  3  . mometasone (NASONEX) 50 MCG/ACT nasal spray Place 2 sprays into the nose at bedtime as needed (allergies).    . OXYGEN Inhale 3 L into the lungs as directed. With CPAP     . phenytoin (DILANTIN) 200 MG ER capsule Take 1 capsule (200 mg total) by mouth 2 (two) times daily. 60 capsule 5  . risperiDONE (RISPERDAL) 1 MG tablet Take 1 mg by mouth every  morning and 2 mg at night    . simvastatin (ZOCOR) 40 MG tablet Take 40 mg  by mouth daily at bedtime 90 tablet 2  . sodium chloride (MURO 128) 2 % ophthalmic solution Place 1 drop into both eyes at bedtime.     No facility-administered medications prior to visit.     PAST MEDICAL HISTORY: Past Medical History:  Diagnosis Date  . Arthritis   . Aspiration pneumonia (HCC)   . Asthma   . Cardiac arrhythmia    have pacemeaker  . Down syndrome   . GERD (gastroesophageal reflux disease)   .  Heart block   . Hypothyroidism   . Mental retardation   . Pacemaker   . Pulmonary emboli (HCC)   . Seizures (HCC)   . Sleep apnea with use of continuous positive airway pressure (CPAP)     PAST SURGICAL HISTORY: Past Surgical History:  Procedure Laterality Date  . PACEMAKER INSERTION  12/09/2015   [employer] Accolade MRI EL PPM implanted in PA for complete heart block and syncope  . PEG TUBE PLACEMENT     removed in 2017  . TIBIA FRACTURE SURGERY Right     FAMILY HISTORY: Family History  Problem Relation Age of Onset  . Diabetes Father   . Heart disease Father   . Breast cancer Sister     SOCIAL HISTORY: Social History   Socioeconomic History  . Marital status: Single    Spouse name: Not on file  . Number of children: Not on file  . Years of education: Not on file  . Highest education level: Not on file  Occupational History  . Occupation: disabled  Social Needs  . Financial resource strain: Not on file  . Food insecurity:    Worry: Not on file    Inability: Not on file  . Transportation needs:    Medical: Not on file    Non-medical: Not on file  Tobacco Use  . Smoking status: Never Smoker  . Smokeless tobacco: Never Used  Substance and Sexual Activity  . Alcohol use: No  . Drug use: No  . Sexual activity: Not on file  Lifestyle  . Physical activity:    Days per week: Not on file    Minutes per session: Not on file  . Stress: Not on file  Relationships    . Social connections:    Talks on phone: Not on file    Gets together: Not on file    Attends religious service: Not on file    Active member of club or organization: Not on file    Attends meetings of clubs or organizations: Not on file    Relationship status: Not on file  . Intimate partner violence:    Fear of current or ex partner: Not on file    Emotionally abused: Not on file    Physically abused: Not on file    Forced sexual activity: Not on file  Other Topics Concern  . Not on file  Social History Narrative   LIves in group home       PHYSICAL EXAM  Vitals:   06/21/18 0901  BP: 128/67  Pulse: 71  Weight: 158 lb 3.2 oz (71.8 kg)  Height: 4\' 6"  (1.372 m)   Body mass index is 38.14 kg/m.  Generalized: Well developed, in no acute distress   Neurological examination  Mentation: Alert. Follows all commands intermittently speech is limited cranial nerve II-XII. Extraocular movements were full. Facial sensation and strength were normal. Motor: The motor testing reveals 5 over 5 strength of all 4 extremities. Good symmetric motor tone is noted throughout.  Sensory: Sensory testing is intact to soft touch on all 4 extremities. No evidence of extinction is noted.  Coordination: unable to test Gait and station: Gait is slightly unsteady.   DIAGNOSTIC DATA (LABS, IMAGING, TESTING) - I reviewed patient records, labs, notes, testing and imaging myself where available.  Lab Results  Component Value Date   WBC 6.7 04/13/2018   HGB 12.1 04/13/2018   HCT 35.9 04/13/2018   MCV 102 (H) 04/13/2018   PLT  149 (L) 04/13/2018      Component Value Date/Time   NA 132 (L) 04/13/2018 1135   K 4.9 04/13/2018 1135   CL 94 (L) 04/13/2018 1135   CO2 25 04/13/2018 1135   GLUCOSE 93 04/13/2018 1135   GLUCOSE 100 (H) 02/06/2018 1833   BUN 17 04/13/2018 1135   CREATININE 0.79 04/13/2018 1135   CALCIUM 9.2 04/13/2018 1135   PROT 7.5 04/13/2018 1135   ALBUMIN 3.8 04/13/2018 1135    AST 16 04/13/2018 1135   ALT 13 04/13/2018 1135   ALKPHOS 87 04/13/2018 1135   BILITOT <0.2 04/13/2018 1135   GFRNONAA 81 04/13/2018 1135   GFRAA 93 04/13/2018 1135   Lab Results  Component Value Date   CHOL 209 (H) 06/15/2018   HDL 106 06/15/2018   LDLCALC 91 06/15/2018   TRIG 60 06/15/2018   CHOLHDL 2.0 06/15/2018   No results found for: HGBA1C No results found for: VITAMINB12 No results found for: TSH    ASSESSMENT AND PLAN 62 y.o. year old female  has a past medical history of Arthritis, Aspiration pneumonia (HCC), Asthma, Cardiac arrhythmia, Down syndrome, GERD (gastroesophageal reflux disease), Heart block, Hypothyroidism, Mental retardation, Pacemaker, Pulmonary emboli (HCC), Seizures (HCC), and Sleep apnea with use of continuous positive airway pressure (CPAP). here with :  1.  Seizures 2.  Down syndrome 3.  Increasing confusion  The patient will continue on Keppra and Dilantin.  I suggested that we add on an additional medication such as Lamictal for continued seizure events.  However the family is not sure that they want to add any additional medication at this time.  I will also check blood work and urinalysis to rule out any possible infection that could be contributing to her increasing confusion.  Once we have results of the blood work and urinalysis the family will decide if they want to add on another medication.  I have advised that if her symptoms worsen or she develops new symptoms they should let us know.  She will follow-up in 6 months or sooner if needed.    Kaitlyn Penny, MSN, NP-C 06/21/2018, 8:57 AM [facility] [address], [address] [place], [place] [address] [phone]

## 2018-06-22 ENCOUNTER — Telehealth: Payer: Self-pay | Admitting: Adult Health

## 2018-06-22 LAB — CBC WITH DIFFERENTIAL/PLATELET
BASOS: 1 %
Basophils Absolute: 0.1 10*3/uL (ref 0.0–0.2)
EOS (ABSOLUTE): 0.2 10*3/uL (ref 0.0–0.4)
EOS: 3 %
HEMATOCRIT: 36 % (ref 34.0–46.6)
HEMOGLOBIN: 12.1 g/dL (ref 11.1–15.9)
Immature Grans (Abs): 0 10*3/uL (ref 0.0–0.1)
Immature Granulocytes: 0 %
LYMPHS ABS: 1.2 10*3/uL (ref 0.7–3.1)
Lymphs: 21 %
MCH: 35 pg — AB (ref 26.6–33.0)
MCHC: 33.6 g/dL (ref 31.5–35.7)
MCV: 104 fL — ABNORMAL HIGH (ref 79–97)
MONOCYTES: 9 %
Monocytes Absolute: 0.6 10*3/uL (ref 0.1–0.9)
NEUTROS ABS: 3.8 10*3/uL (ref 1.4–7.0)
Neutrophils: 66 %
Platelets: 122 10*3/uL — ABNORMAL LOW (ref 150–450)
RBC: 3.46 x10E6/uL — ABNORMAL LOW (ref 3.77–5.28)
RDW: 12.2 % — ABNORMAL LOW (ref 12.3–15.4)
WBC: 5.8 10*3/uL (ref 3.4–10.8)

## 2018-06-22 LAB — COMPREHENSIVE METABOLIC PANEL
ALBUMIN: 3.7 g/dL (ref 3.6–4.8)
ALT: 15 IU/L (ref 0–32)
AST: 22 IU/L (ref 0–40)
Albumin/Globulin Ratio: 0.9 — ABNORMAL LOW (ref 1.2–2.2)
Alkaline Phosphatase: 91 IU/L (ref 39–117)
BILIRUBIN TOTAL: 0.3 mg/dL (ref 0.0–1.2)
BUN / CREAT RATIO: 23 (ref 12–28)
BUN: 18 mg/dL (ref 8–27)
CALCIUM: 9.2 mg/dL (ref 8.7–10.3)
CO2: 23 mmol/L (ref 20–29)
CREATININE: 0.79 mg/dL (ref 0.57–1.00)
Chloride: 93 mmol/L — ABNORMAL LOW (ref 96–106)
GFR calc Af Amer: 93 mL/min/{1.73_m2} (ref >59)
GFR, EST NON AFRICAN AMERICAN: 80 mL/min/{1.73_m2} (ref >59)
Globulin, Total: 3.9 g/dL (ref 1.5–4.5)
Glucose: 81 mg/dL (ref 65–99)
Potassium: 5 mmol/L (ref 3.5–5.2)
Sodium: 132 mmol/L — ABNORMAL LOW (ref 134–144)
TOTAL PROTEIN: 7.6 g/dL (ref 6.0–8.5)

## 2018-06-22 LAB — MICROSCOPIC EXAMINATION
Casts: NONE SEEN /lpf
WBC, UA: >30 /hpf — AB (ref 0–5)

## 2018-06-22 LAB — URINALYSIS, ROUTINE W REFLEX MICROSCOPIC
BILIRUBIN UA: NEGATIVE
Glucose, UA: NEGATIVE
KETONES UA: NEGATIVE
NITRITE UA: POSITIVE — AB
SPEC GRAV UA: 1.012 (ref 1.005–1.030)
Urobilinogen, Ur: 0.2 mg/dL (ref 0.2–1.0)
pH, UA: 6.5 (ref 5.0–7.5)

## 2018-06-22 MED ORDER — NITROFURANTOIN MONOHYD MACRO 100 MG PO CAPS: 100.0000 mg | ORAL_CAPSULE | Freq: Two times a day (BID) | ORAL | 0 refills | Status: AC

## 2018-06-22 NOTE — Telephone Encounter (Signed)
I called the patient's sister.  Advised that urinalysis does show possible urinary tract infection.  Since reports that she was on Macrobid approximately 5 to 6 months ago for urinary tract infection and she tolerated it well.  She has been on cephalexin in the past but that caused seizures.  I will re-prescribe Macrobid 100 mg twice a day for 10 days however in the future we may have to use a different antibiotic if she continues to have infections.

## 2018-06-24 ENCOUNTER — Other Ambulatory Visit: Payer: Medicare Other

## 2018-07-18 ENCOUNTER — Telehealth: Payer: Self-pay

## 2018-07-18 ENCOUNTER — Ambulatory Visit (INDEPENDENT_AMBULATORY_CARE_PROVIDER_SITE_OTHER): Payer: Medicare Other

## 2018-07-18 DIAGNOSIS — I442 Atrioventricular block, complete: Secondary | ICD-10-CM | POA: Diagnosis not present

## 2018-07-18 NOTE — Telephone Encounter (Signed)
LMOVM reminding pt to send remote transmission.   

## 2018-07-19 ENCOUNTER — Encounter: Payer: Self-pay | Admitting: Cardiology

## 2018-07-19 NOTE — Progress Notes (Signed)
Remote pacemaker transmission.   

## 2018-07-22 ENCOUNTER — Encounter: Payer: Self-pay | Admitting: Neurology

## 2018-07-25 ENCOUNTER — Telehealth: Payer: Self-pay | Admitting: Pulmonary Disease

## 2018-07-25 DIAGNOSIS — G4733 Obstructive sleep apnea (adult) (pediatric): Secondary | ICD-10-CM

## 2018-07-25 NOTE — Telephone Encounter (Signed)
Please place order for supplies. Please schedule an appointment with Dr. Kendrick FriesMcQuaid. Thanks so much

## 2018-07-25 NOTE — Telephone Encounter (Signed)
Left message informing patient daughter that we have placed order for Bi-pap supplies and to call if anything else was needed. Order placed for Bi-PAP supplies with [facility]. Nothing further is needed at this time.

## 2018-07-25 NOTE — Telephone Encounter (Signed)
Called and spoke with Patient's sister, [name].  She stated [facility] is needing a order for new BIPAP supplies.  Patient last OV was 01/11/18, with Dr. Kendrick FriesMcQuaid. [name] stated they have been trying since the beginning of the month for new supplies.  Evette DoffingSarah G, Please advise if ok for Bipap supplies order

## 2018-07-25 NOTE — Addendum Note (Signed)
Addended by: Kerin RansomBLACKWELL, Mckinzey Entwistle on: 07/25/2018 12:10 PM   Modules accepted: Orders

## 2018-07-25 NOTE — Addendum Note (Signed)
Addended by: Kerin RansomBLACKWELL, Alexander Mcauley on: 07/25/2018 12:12 PM   Modules accepted: Orders

## 2018-07-29 ENCOUNTER — Telehealth: Payer: Self-pay | Admitting: Pulmonary Disease

## 2018-07-29 DIAGNOSIS — G4733 Obstructive sleep apnea (adult) (pediatric): Secondary | ICD-10-CM

## 2018-07-29 NOTE — Telephone Encounter (Signed)
Pt sister [name]([name])  returning call cb# [phone]

## 2018-07-29 NOTE — Telephone Encounter (Signed)
Called and spoke with patients sister, the patients DME company is [facility] not [facility]. I have discontinued the supplies from [facility] and [facility]. Nothing further needed.

## 2018-07-29 NOTE — Telephone Encounter (Signed)
Attempted to call patient today regarding DME info. I did not receive an answer at time of call. I have left a voicemail message for Kaitlyn Powell to return call. X1

## 2018-08-01 ENCOUNTER — Inpatient Hospital Stay (HOSPITAL_COMMUNITY)
Admission: EM | Admit: 2018-08-01 | Discharge: 2018-08-03 | DRG: 177 | Disposition: A | Discharge status: Home / Self Care | Payer: Medicare Other | Attending: Internal Medicine | Admitting: Internal Medicine

## 2018-08-01 ENCOUNTER — Ambulatory Visit: Payer: Medicare Other | Admitting: Neurology

## 2018-08-01 ENCOUNTER — Emergency Department (HOSPITAL_COMMUNITY): Payer: Medicare Other

## 2018-08-01 ENCOUNTER — Encounter (HOSPITAL_COMMUNITY): Payer: Self-pay | Admitting: Emergency Medicine

## 2018-08-01 DIAGNOSIS — D696 Thrombocytopenia, unspecified: Secondary | ICD-10-CM | POA: Diagnosis present

## 2018-08-01 DIAGNOSIS — Q909 Down syndrome, unspecified: Secondary | ICD-10-CM | POA: Diagnosis not present

## 2018-08-01 DIAGNOSIS — R1310 Dysphagia, unspecified: Secondary | ICD-10-CM | POA: Diagnosis present

## 2018-08-01 DIAGNOSIS — G4730 Sleep apnea, unspecified: Secondary | ICD-10-CM | POA: Diagnosis present

## 2018-08-01 DIAGNOSIS — J181 Lobar pneumonia, unspecified organism: Secondary | ICD-10-CM

## 2018-08-01 DIAGNOSIS — Z86711 Personal history of pulmonary embolism: Secondary | ICD-10-CM

## 2018-08-01 DIAGNOSIS — D539 Nutritional anemia, unspecified: Secondary | ICD-10-CM | POA: Diagnosis present

## 2018-08-01 DIAGNOSIS — R569 Unspecified convulsions: Secondary | ICD-10-CM | POA: Diagnosis not present

## 2018-08-01 DIAGNOSIS — Z9989 Dependence on other enabling machines and devices: Secondary | ICD-10-CM

## 2018-08-01 DIAGNOSIS — Z79899 Other long term (current) drug therapy: Secondary | ICD-10-CM

## 2018-08-01 DIAGNOSIS — E039 Hypothyroidism, unspecified: Secondary | ICD-10-CM | POA: Diagnosis present

## 2018-08-01 DIAGNOSIS — E860 Dehydration: Secondary | ICD-10-CM | POA: Diagnosis present

## 2018-08-01 DIAGNOSIS — Z881 Allergy status to other antibiotic agents status: Secondary | ICD-10-CM | POA: Diagnosis not present

## 2018-08-01 DIAGNOSIS — G92 Toxic encephalopathy: Secondary | ICD-10-CM | POA: Diagnosis present

## 2018-08-01 DIAGNOSIS — G4733 Obstructive sleep apnea (adult) (pediatric): Secondary | ICD-10-CM | POA: Diagnosis present

## 2018-08-01 DIAGNOSIS — K219 Gastro-esophageal reflux disease without esophagitis: Secondary | ICD-10-CM | POA: Diagnosis present

## 2018-08-01 DIAGNOSIS — Z791 Long term (current) use of non-steroidal anti-inflammatories (NSAID): Secondary | ICD-10-CM

## 2018-08-01 DIAGNOSIS — J690 Pneumonitis due to inhalation of food and vomit: Principal | ICD-10-CM | POA: Diagnosis present

## 2018-08-01 DIAGNOSIS — F79 Unspecified intellectual disabilities: Secondary | ICD-10-CM | POA: Diagnosis present

## 2018-08-01 DIAGNOSIS — D7589 Other specified diseases of blood and blood-forming organs: Secondary | ICD-10-CM | POA: Diagnosis present

## 2018-08-01 DIAGNOSIS — J0100 Acute maxillary sinusitis, unspecified: Secondary | ICD-10-CM | POA: Diagnosis present

## 2018-08-01 DIAGNOSIS — G40909 Epilepsy, unspecified, not intractable, without status epilepticus: Secondary | ICD-10-CM | POA: Diagnosis present

## 2018-08-01 DIAGNOSIS — Z950 Presence of cardiac pacemaker: Secondary | ICD-10-CM | POA: Diagnosis not present

## 2018-08-01 DIAGNOSIS — G928 Other toxic encephalopathy: Secondary | ICD-10-CM | POA: Diagnosis present

## 2018-08-01 DIAGNOSIS — Z888 Allergy status to other drugs, medicaments and biological substances status: Secondary | ICD-10-CM | POA: Diagnosis not present

## 2018-08-01 DIAGNOSIS — J189 Pneumonia, unspecified organism: Secondary | ICD-10-CM

## 2018-08-01 LAB — URINALYSIS, ROUTINE W REFLEX MICROSCOPIC
Bilirubin Urine: NEGATIVE
Glucose, UA: NEGATIVE mg/dL
HGB URINE DIPSTICK: NEGATIVE
Ketones, ur: NEGATIVE mg/dL
Leukocytes, UA: NEGATIVE
Nitrite: NEGATIVE
PH: 6 (ref 5.0–8.0)
Protein, ur: NEGATIVE mg/dL
Specific Gravity, Urine: 1.011 (ref 1.005–1.030)

## 2018-08-01 LAB — COMPREHENSIVE METABOLIC PANEL
ALT: 15 U/L (ref 0–44)
AST: 19 U/L (ref 15–41)
Albumin: 3 g/dL — ABNORMAL LOW (ref 3.5–5.0)
Alkaline Phosphatase: 66 U/L (ref 38–126)
Anion gap: 9 (ref 5–15)
BUN: 24 mg/dL — ABNORMAL HIGH (ref 8–23)
CALCIUM: 8.6 mg/dL — AB (ref 8.9–10.3)
CO2: 25 mmol/L (ref 22–32)
Chloride: 101 mmol/L (ref 98–111)
Creatinine, Ser: 0.82 mg/dL (ref 0.44–1.00)
GFR calc Af Amer: >60 mL/min (ref >60)
GFR calc non Af Amer: >60 mL/min (ref >60)
Glucose, Bld: 92 mg/dL (ref 70–99)
Potassium: 4.1 mmol/L (ref 3.5–5.1)
Sodium: 135 mmol/L (ref 135–145)
Total Bilirubin: 0.4 mg/dL (ref 0.3–1.2)
Total Protein: 6.9 g/dL (ref 6.5–8.1)

## 2018-08-01 LAB — CBC WITH DIFFERENTIAL/PLATELET
Abs Immature Granulocytes: 0.11 10*3/uL — ABNORMAL HIGH (ref 0.00–0.07)
Basophils Absolute: 0 10*3/uL (ref 0.0–0.1)
Basophils Relative: 0 %
Eosinophils Absolute: 0.1 10*3/uL (ref 0.0–0.5)
Eosinophils Relative: 1 %
HCT: 36 % (ref 36.0–46.0)
HEMOGLOBIN: 11.6 g/dL — AB (ref 12.0–15.0)
IMMATURE GRANULOCYTES: 1 %
Lymphocytes Relative: 9 %
Lymphs Abs: 1.1 10*3/uL (ref 0.7–4.0)
MCH: 34.6 pg — ABNORMAL HIGH (ref 26.0–34.0)
MCHC: 32.2 g/dL (ref 30.0–36.0)
MCV: 107.5 fL — ABNORMAL HIGH (ref 80.0–100.0)
Monocytes Absolute: 1 10*3/uL (ref 0.1–1.0)
Monocytes Relative: 8 %
NEUTROS PCT: 81 %
Neutro Abs: 10.4 10*3/uL — ABNORMAL HIGH (ref 1.7–7.7)
Platelets: 110 10*3/uL — ABNORMAL LOW (ref 150–400)
RBC: 3.35 MIL/uL — ABNORMAL LOW (ref 3.87–5.11)
RDW: 12.1 % (ref 11.5–15.5)
WBC: 12.7 10*3/uL — ABNORMAL HIGH (ref 4.0–10.5)
nRBC: 0 % (ref 0.0–0.2)

## 2018-08-01 LAB — INFLUENZA PANEL BY PCR (TYPE A & B)
Influenza A By PCR: NEGATIVE
Influenza B By PCR: NEGATIVE

## 2018-08-01 MED ORDER — PHENYTOIN SODIUM EXTENDED 100 MG PO CAPS
200.0000 mg | ORAL_CAPSULE | Freq: Two times a day (BID) | ORAL | Status: DC
  Administered 2018-08-01 – 2018-08-03 (×4): 200 mg via ORAL
  Filled 2018-08-01 (×4): qty 2

## 2018-08-01 MED ORDER — LEVOTHYROXINE SODIUM 150 MCG PO TABS
150.0000 ug | ORAL_TABLET | Freq: Every day | ORAL | Status: DC
  Administered 2018-08-02 – 2018-08-03 (×2): 150 ug via ORAL
  Filled 2018-08-01 (×3): qty 1

## 2018-08-01 MED ORDER — SENNOSIDES-DOCUSATE SODIUM 8.6-50 MG PO TABS: 1.0000 | ORAL_TABLET | Freq: Every evening | ORAL | Status: DC | PRN

## 2018-08-01 MED ORDER — SODIUM CHLORIDE 0.9 % IV SOLN
3.0000 g | Freq: Four times a day (QID) | INTRAVENOUS | Status: DC
  Administered 2018-08-02 – 2018-08-03 (×7): 3 g via INTRAVENOUS
  Filled 2018-08-01 (×8): qty 3

## 2018-08-01 MED ORDER — LACTATED RINGERS IV BOLUS: 1000.0000 mL | Freq: Once | INTRAVENOUS | Status: DC

## 2018-08-01 MED ORDER — LEVOFLOXACIN IN D5W 750 MG/150ML IV SOLN: 750.0000 mg | Freq: Once | INTRAVENOUS | Status: DC

## 2018-08-01 MED ORDER — RISPERIDONE 1 MG PO TABS
1.0000 mg | ORAL_TABLET | Freq: Every day | ORAL | Status: DC
  Administered 2018-08-02 – 2018-08-03 (×2): 1 mg via ORAL
  Filled 2018-08-01 (×2): qty 1

## 2018-08-01 MED ORDER — VITAMIN B-12 100 MCG PO TABS
100.0000 ug | ORAL_TABLET | Freq: Every day | ORAL | Status: DC
  Administered 2018-08-02 – 2018-08-03 (×2): 100 ug via ORAL
  Filled 2018-08-01 (×2): qty 1

## 2018-08-01 MED ORDER — LEVETIRACETAM ER 500 MG PO TB24
1000.0000 mg | ORAL_TABLET | Freq: Two times a day (BID) | ORAL | Status: DC
  Administered 2018-08-01 – 2018-08-03 (×4): 1000 mg via ORAL
  Filled 2018-08-01 (×4): qty 2

## 2018-08-01 MED ORDER — LORAZEPAM 2 MG/ML IJ SOLN: 1.0000 mg | Freq: Four times a day (QID) | INTRAMUSCULAR | Status: DC | PRN

## 2018-08-01 MED ORDER — ENOXAPARIN SODIUM 40 MG/0.4ML ~~LOC~~ SOLN
40.0000 mg | SUBCUTANEOUS | Status: DC
  Administered 2018-08-01 – 2018-08-02 (×2): 40 mg via SUBCUTANEOUS
  Filled 2018-08-01 (×2): qty 0.4

## 2018-08-01 MED ORDER — LACTATED RINGERS IV BOLUS
1000.0000 mL | Freq: Once | INTRAVENOUS | Status: AC
  Administered 2018-08-01: 1000 mL via INTRAVENOUS

## 2018-08-01 MED ORDER — ACETAMINOPHEN 325 MG PO TABS: 650.0000 mg | ORAL_TABLET | Freq: Four times a day (QID) | ORAL | Status: DC | PRN

## 2018-08-01 MED ORDER — RISPERIDONE 1 MG PO TABS: 1.0000 mg | ORAL_TABLET | Freq: Two times a day (BID) | ORAL | Status: DC

## 2018-08-01 MED ORDER — CITALOPRAM HYDROBROMIDE 20 MG PO TABS
20.0000 mg | ORAL_TABLET | Freq: Every day | ORAL | Status: DC
  Administered 2018-08-02 – 2018-08-03 (×2): 20 mg via ORAL
  Filled 2018-08-01 (×2): qty 1

## 2018-08-01 MED ORDER — FOLIC ACID 1 MG PO TABS
1.0000 mg | ORAL_TABLET | Freq: Every day | ORAL | Status: DC
  Administered 2018-08-02 – 2018-08-03 (×2): 1 mg via ORAL
  Filled 2018-08-01 (×2): qty 1

## 2018-08-01 MED ORDER — ACETAMINOPHEN 650 MG RE SUPP: 650.0000 mg | Freq: Four times a day (QID) | RECTAL | Status: DC | PRN

## 2018-08-01 MED ORDER — ALBUTEROL SULFATE (2.5 MG/3ML) 0.083% IN NEBU: 2.5000 mg | INHALATION_SOLUTION | RESPIRATORY_TRACT | Status: DC | PRN

## 2018-08-01 MED ORDER — SODIUM CHLORIDE 0.9 % IV SOLN
INTRAVENOUS | Status: AC
  Administered 2018-08-01: 20:00:00 via INTRAVENOUS

## 2018-08-01 MED ORDER — SODIUM CHLORIDE 0.9% FLUSH
3.0000 mL | Freq: Two times a day (BID) | INTRAVENOUS | Status: DC
  Administered 2018-08-01: 3 mL via INTRAVENOUS

## 2018-08-01 MED ORDER — RISPERIDONE 1 MG PO TABS
2.0000 mg | ORAL_TABLET | Freq: Every day | ORAL | Status: DC
  Administered 2018-08-01 – 2018-08-02 (×2): 2 mg via ORAL
  Filled 2018-08-01 (×2): qty 2

## 2018-08-01 MED ORDER — SIMVASTATIN 40 MG PO TABS
40.0000 mg | ORAL_TABLET | Freq: Every day | ORAL | Status: DC
  Administered 2018-08-01 – 2018-08-02 (×2): 40 mg via ORAL
  Filled 2018-08-01 (×2): qty 1

## 2018-08-01 MED ORDER — SODIUM CHLORIDE 0.9 % IV SOLN
3.0000 g | Freq: Once | INTRAVENOUS | Status: AC
  Administered 2018-08-01: 3 g via INTRAVENOUS
  Filled 2018-08-01: qty 3

## 2018-08-01 NOTE — ED Notes (Signed)
Called main phlebotomy to attempt to get blood samples.

## 2018-08-01 NOTE — ED Notes (Signed)
Lab at bedside, attempting to obtain blood samples.

## 2018-08-01 NOTE — ED Notes (Signed)
ED TO INPATIENT HANDOFF REPORT  Name/Age/Gender Kaitlyn Powell 62 y.o. female  Code Status Advance Directive Documentation     Most Recent Value  Type of Advance Directive  Healthcare Power of Attorney  Pre-existing out of facility DNR order (yellow form or pink MOST form)  -  "MOST" Form in Place?  -      Home/SNF/Other Home  Chief Complaint WEAKNESS   Level of Care/Admitting Diagnosis ED Disposition    ED Disposition Condition Comment   Admit  Hospital Area: [facility] [[id number]]  Level of Care: Telemetry [5]  Admit to tele based on following criteria: Other see comments  Comments: Suspected seizures  Diagnosis: Aspiration pneumonia [facility]) [[id number]]  Admitting Physician: Elease Etienne [[id number]]  Attending Physician: Marcellus Scott D [[id number]]  Estimated length of stay: past midnight tomorrow  Certification:: I certify this patient will need inpatient services for at least 2 midnights  PT Class (Do Not Modify): Inpatient [101]  PT Acc Code (Do Not Modify): Private [1]       Medical History Past Medical History:  Diagnosis Date  . Arthritis   . Aspiration pneumonia (HCC)   . Asthma   . Cardiac arrhythmia    have pacemeaker  . Down syndrome   . GERD (gastroesophageal reflux disease)   . Heart block   . Hypothyroidism   . Mental retardation   . Pacemaker   . Pulmonary emboli (HCC)   . Seizures (HCC)   . Sleep apnea with use of continuous positive airway pressure (CPAP)     Allergies Allergies  Allergen Reactions  . Cephalexin Other (See Comments)    Seizures   . Namenda [Memantine Hcl] Other (See Comments)    Made memory issues worse, sleepiness, increased confusion    IV Location/Drains/Wounds Patient Lines/Drains/Airways Status   Active Line/Drains/Airways    Name:   Placement date:   Placement time:   Site:   Days:   Peripheral IV 02/06/18 Right Antecubital   02/06/18    1849    Antecubital   176   Peripheral IV 08/01/18 Left  Wrist   08/01/18    1439    Wrist   less than 1   Peripheral IV 08/01/18 Right;Anterior Forearm   08/01/18    1702    Forearm   less than 1   Gastrostomy/Enterostomy PEG-jejunostomy LUQ   -    -    LUQ             Labs/Imaging Results for orders placed or performed during the hospital encounter of 08/01/18 (from the past 48 hour(s))  Urinalysis, Routine w reflex microscopic     Status: None   Collection Time: 08/01/18  4:03 PM  Result Value Ref Range   Color, Urine YELLOW YELLOW   APPearance CLEAR CLEAR   Specific Gravity, Urine 1.011 1.005 - 1.030   pH 6.0 5.0 - 8.0   Glucose, UA NEGATIVE NEGATIVE mg/dL   Hgb urine dipstick NEGATIVE NEGATIVE   Bilirubin Urine NEGATIVE NEGATIVE   Ketones, ur NEGATIVE NEGATIVE mg/dL   Protein, ur NEGATIVE NEGATIVE mg/dL   Nitrite NEGATIVE NEGATIVE   Leukocytes, UA NEGATIVE NEGATIVE    Comment: Performed at [facility] [place], [address] [place], [place] [address]  CBC with Differential     Status: Abnormal   Collection Time: 08/01/18  4:08 PM  Result Value Ref Range   WBC 12.7 (H) 4.0 - 10.5 K/uL   RBC 3.35 (L) 3.87 -  5.11 MIL/uL   Hemoglobin 11.6 (L) 12.0 - 15.0 g/dL   HCT 59.536.0 63.836.0 - 75.646.0 %   MCV 107.5 (H) 80.0 - 100.0 fL   MCH 34.6 (H) 26.0 - 34.0 pg   MCHC 32.2 30.0 - 36.0 g/dL   RDW 43.312.1 29.511.5 - 18.815.5 %   Platelets 110 (L) 150 - 400 K/uL    Comment: Immature Platelet Fraction may be clinically indicated, consider ordering this additional test [id number]    nRBC 0.0 0.0 - 0.2 %   Neutrophils Relative % 81 %   Neutro Abs 10.4 (H) 1.7 - 7.7 K/uL   Lymphocytes Relative 9 %   Lymphs Abs 1.1 0.7 - 4.0 K/uL   Monocytes Relative 8 %   Monocytes Absolute 1.0 0.1 - 1.0 K/uL   Eosinophils Relative 1 %   Eosinophils Absolute 0.1 0.0 - 0.5 K/uL   Basophils Relative 0 %   Basophils Absolute 0.0 0.0 - 0.1 K/uL   Immature Granulocytes 1 %   Abs Immature Granulocytes 0.11 (H) 0.00 - 0.07 K/uL    Comment: Performed at [facility], [address] [place], [place] [address]  Comprehensive metabolic panel     Status: Abnormal   Collection Time: 08/01/18  4:08 PM  Result Value Ref Range   Sodium 135 135 - 145 mmol/L   Potassium 4.1 3.5 - 5.1 mmol/L   Chloride 101 98 - 111 mmol/L   CO2 25 22 - 32 mmol/L   Glucose, Bld 92 70 - 99 mg/dL   BUN 24 (H) 8 - 23 mg/dL   Creatinine, Ser 9.320.82 0.44 - 1.00 mg/dL   Calcium 8.6 (L) 8.9 - 10.3 mg/dL   Total Protein 6.9 6.5 - 8.1 g/dL   Albumin 3.0 (L) 3.5 - 5.0 g/dL   AST 19 15 - 41 U/L   ALT 15 0 - 44 U/L   Alkaline Phosphatase 66 38 - 126 U/L   Total Bilirubin 0.4 0.3 - 1.2 mg/dL   GFR calc non Af Amer >60 >60 mL/min   GFR calc Af Amer >60 >60 mL/min   Anion gap 9 5 - 15    Comment: Performed at [facility], [address] [place], [place] [address]   Dg Chest 2 View  Result Date: 08/01/2018 CLINICAL DATA:  Cough and altered mental status. EXAM: CHEST - 2 VIEW COMPARISON:  02/06/2018 and prior radiograph FINDINGS: Mild cardiomegaly and RIGHT pacemaker again noted. New RIGHT LOWER lung airspace opacity likely represents pneumonia. No pneumothorax or acute bony abnormality. Remote LEFT clavicle and rib fractures again identified. IMPRESSION: New RIGHT LOWER lung airspace opacity compatible with pneumonia. Radiographic follow-up to resolution recommended. Electronically Signed   By: Harmon PierJeffrey  Hu M.D.   On: 08/01/2018 15:38   Ct Head Wo Contrast  Result Date: 08/01/2018 CLINICAL DATA:  62 year old female with chronic headache and altered mental status. EXAM: CT HEAD WITHOUT CONTRAST TECHNIQUE: Contiguous axial images were obtained from the base of the skull through the vertex without intravenous contrast. COMPARISON:  05/31/2017 and 05/11/2016 CTs, and 02/01/2017 MR FINDINGS: Brain: Ventriculomegaly has slightly increased since 2018. Atrophy and probable chronic small-vessel white matter ischemic changes again noted. There is no evidence  of midline shift, hemorrhage, acute infarct, mass or extra-axial collection. Vascular: Carotid atherosclerotic calcifications again noted. Skull: Normal. Negative for fracture or focal lesion. Sinuses/Orbits: Fluid in the RIGHT maxillary sinus again noted. Other: None IMPRESSION: 1. Slightly increased ventriculomegaly, which may be secondary to atrophy  and white matter disease but communicating hydrocephalus is not excluded. 2. Atrophy, chronic small-vessel white matter ischemic changes and carotid atherosclerosis. 3. Fluid in the RIGHT maxillary sinus again noted which may represent acute sinusitis. Electronically Signed   By: Harmon PierJeffrey  Hu M.D.   On: 08/01/2018 15:53   None  Pending Labs Unresulted Labs (From admission, onward)    Start     Ordered   08/01/18 1651  Influenza panel by PCR (type A & B)  ONCE - STAT,   STAT     08/01/18 1650   08/01/18 1412  Phenytoin level, free and total  Once,   R     08/01/18 1411   08/01/18 1412  Levetiracetam level  Once,   R     08/01/18 1411   Signed and Held  HIV antibody (Routine Testing)  Once,   R     Signed and Held   Signed and Held  Creatinine, serum  (enoxaparin (LOVENOX)    CrCl >/= 30 ml/min)  Weekly,   R    Comments:  while on enoxaparin therapy    Signed and Held   Signed and Held  Basic metabolic panel  Tomorrow morning,   R     Signed and Held   Signed and Held  CBC  Tomorrow morning,   R     Signed and Held          Vitals/Pain Today's Vitals   08/01/18 1715 08/01/18 1730 08/01/18 1745 08/01/18 1800  BP: (!) 116/56 (!) 103/59 (!) 100/50 (!) 105/46  Pulse: 80 62 67 68  Resp: (!) 25 16 12 19   Temp:      TempSrc:      SpO2: 92% 93% 95% 96%  PainSc:        Isolation Precautions No active isolations  Medications Medications  lactated ringers bolus 1,000 mL (0 mLs Intravenous Stopped 08/01/18 1736)  Ampicillin-Sulbactam (UNASYN) 3 g in sodium chloride 0.9 % 100 mL IVPB (0 g Intravenous Stopped 08/01/18 1811)     Mobility walks with device

## 2018-08-01 NOTE — ED Notes (Signed)
Gave report to Duwayne Heckanielle, RN for room [phone].

## 2018-08-01 NOTE — Progress Notes (Signed)
Called portable equipment. They stated that no seizure pads available at this time.

## 2018-08-01 NOTE — Progress Notes (Signed)
Pharmacy Antibiotic Note  Benito MccreedyJeanne L Lensing is a 62 y.o. female admitted on 08/01/2018 with aspiration pneumonia.  Pharmacy has been consulted for Unasyn dosing.  Plan: Unasyn 3g IV q6h No dose adjustments needed, Pharmacy will sign off     Temp (24hrs), Avg:98.4 F (36.9 C), Min:98.4 F (36.9 C), Max:98.4 F (36.9 C)  Recent Labs  Lab 08/01/18 1608  WBC 12.7*  CREATININE 0.82    CrCl cannot be calculated (Unknown ideal weight.).    Allergies  Allergen Reactions  . Cephalexin Other (See Comments)    Seizures   . Namenda [Memantine Hcl] Other (See Comments)    Made memory issues worse, sleepiness, increased confusion    Antimicrobials this admission: 12/30 Unasyn >>  Dose adjustments this admission: None  Microbiology results: 12/30 Influenza A/B: neg/neg  Thank you for allowing pharmacy to be a part of this patient's care.  Loralee PacasErin Vianey Caniglia, PharmD, BCPS Pager: [phone] 08/01/2018 7:24 PM

## 2018-08-01 NOTE — ED Triage Notes (Signed)
Per EMS-states family states she has not been acting herself-states productive cough-states she had a Twitching" episode that lasted 10 minutes-warm to touch-demand pacemaker-VS WNL-cbg 113

## 2018-08-01 NOTE — ED Notes (Signed)
Provided a warm blanket.

## 2018-08-01 NOTE — ED Notes (Signed)
IV team is at bedside, attempting to obtain a better IV.

## 2018-08-01 NOTE — ED Notes (Signed)
Lab is unble to blood samples. Placed an order for an IV ultrasound.

## 2018-08-01 NOTE — H&P (Signed)
History and Physical    Kaitlyn Powell ZOX:[id number] DOB: 08-24-1955 DOA: 08/01/2018  PCP: Tracey Harries, MD   I have briefly reviewed patients previous medical reports in [facility].  Patient coming from: Home  Chief Complaint: Increasing seizures, altered mental status, cough.  HPI: Kaitlyn Powell is a very pleasant 62 year old female patient, lives with her sister Youth worker) and brother-in-law, ambulates with the help of their assistance but does not use a cane or a walker, PMH of Down syndrome with mental retardation, seizure disorder, aspiration pneumonia, dysphagia- off of thickener for more than a year and tolerating regular diet, heart block status post pacemaker, GERD, hypothyroid, PE completed anticoagulation, OSA on CPAP with 3 L oxygen, [facility] ED on 08/01/2018 with above complaints.  Patient unable to provide history due to Down syndrome and mental retardation.  History obtained from sister at bedside.  At baseline, patient able to talk to family and communicate.  She usually has a seizure once every 3 to 4 days, seizures are described as "drop seizures" where her head slumped down for a couple of seconds and then she is back up.  For the last 10 days, family have noticed increased seizure up to 3 times per day, these episodes are lasting longer up to 15 seconds and she seems to be more somnolent post episodes.  She has been compliant with her medications.  Since day prior to admission, noted a dry cough but no fevers, chills or dyspnea.  Historians husband who works as a Lobbyist has had a sore throat for the last 2 days.  No reported history of obvious choking or aspiration.  Patient had an appointment this afternoon to see a neurologist for second opinion regarding seizures.  Patient stated awake late into the night last night which was unusual for her.  This morning, family had difficulty waking her up.  When they did wake her up and sat her up in bed, she had  upper extremity involuntary jerky movement that lasted about 15 minutes and then resolved spontaneously.  This was not associated with eye rolling, frothing at the mouth, tongue biting or incontinence.  The same episode again occurred at around 11 AM.  Over the last 2 days, she has not been eating or drinking much and over the last 10 days, she has not been as active and has been mostly in bed.  She was thereby brought to the ED for further evaluation.  ED Course: Afebrile, soft blood pressures, lab work significant for WBC 12.7, hemoglobin 11.6, platelets 110, BUN 24, normal creatinine, urine microscopy negative for features of UTI, chest x-ray shows right lower lobe airspace opacity compatible with pneumonia, CT head shows slightly increased ventriculomegaly.  EDP discussed with neuro hospitalist on call who advised that the ventriculomegaly is most likely related to brain atrophy rather than hydrocephalus.  Review of Systems:  All other systems reviewed and apart from HPI, are negative.  Past Medical History:  Diagnosis Date  . Arthritis   . Aspiration pneumonia (HCC)   . Asthma   . Cardiac arrhythmia    have pacemeaker  . Down syndrome   . GERD (gastroesophageal reflux disease)   . Heart block   . Hypothyroidism   . Mental retardation   . Pacemaker   . Pulmonary emboli (HCC)   . Seizures (HCC)   . Sleep apnea with use of continuous positive airway pressure (CPAP)     Past Surgical History:  Procedure Laterality  Date  . PACEMAKER INSERTION  12/09/2015   [employer] Accolade MRI EL PPM implanted in PA for complete heart block and syncope  . PEG TUBE PLACEMENT     removed in 2017  . TIBIA FRACTURE SURGERY Right     Social History  reports that she has never smoked. She has never used smokeless tobacco. She reports that she does not drink alcohol or use drugs.  Allergies  Allergen Reactions  . Cephalexin Other (See Comments)    Seizures   . Namenda [Memantine Hcl] Other  (See Comments)    Made memory issues worse, sleepiness, increased confusion    Family History  Problem Relation Age of Onset  . Diabetes Father   . Heart disease Father   . Breast cancer Sister      Prior to Admission medications   Medication Sig Start Date End Date Taking? Authorizing Provider  citalopram (CELEXA) 20 MG tablet Take 20 mg by mouth daily.   Yes [provider]  cyanocobalamin (TH VITAMIN B12) 100 MCG tablet Take 100 mcg by mouth every morning.    Yes [provider]  folic acid (FOLVITE) 1 MG tablet Take 1 mg  by mouth  daily   Yes [provider]  levETIRAcetam (KEPPRA XR) 500 MG 24 hr tablet Take 3 tablets (1,500 mg total) by mouth daily. Patient taking differently: Take 1,000 mg by mouth every evening.  04/13/18  Yes Butch PennyMillikan, Megan, NP  levothyroxine (SYNTHROID, LEVOTHROID) 150 MCG tablet Take 150 mcg by mouth daily before breakfast.   Yes [provider]  OXYGEN Inhale 3 L into the lungs as directed. With CPAP    Yes [provider]  phenytoin (DILANTIN) 200 MG ER capsule Take 1 capsule (200 mg total) by mouth 2 (two) times daily. 07/16/17  Yes Butch PennyMillikan, Megan, NP  risperiDONE (RISPERDAL) 1 MG tablet Take 1 mg by mouth every morning and 2 mg at night   Yes [provider]  simvastatin (ZOCOR) 40 MG tablet Take 40 mg  by mouth daily at bedtime 04/22/18  Yes Allred, Fayrene FearingJames, MD  fluticasone (FLONASE) 50 MCG/ACT nasal spray PLACE 2 SPRAYS DAILY INTO BOTH NOSTRILS. Patient not taking: Reported on 08/01/2018 06/13/18   Lupita LeashMcQuaid, Douglas B, MD  meloxicam (MOBIC) 15 MG tablet TAKE ONE TABLET (15 MG DOSE) BY MOUTH DAILY. 04/16/18   [provider]    Physical Exam: Vitals:   08/01/18 1715 08/01/18 1730 08/01/18 1745 08/01/18 1800  BP: (!) 116/56 (!) 103/59 (!) 100/50 (!) 105/46  Pulse: 80 62 67 68  Resp: (!) 25 16 12 19   Temp:      TempSrc:      SpO2: 92% 93% 95% 96%      Constitutional: Pleasant  middle-aged female, small built and moderately nourished lying comfortably propped up in bed. Eyes: PERTLA, lids and conjunctivae normal. Has Down's facies. ENMT: Mucous membranes are dry. Posterior pharynx clear of any exudate or lesions. Normal dentition.  Neck: supple, no masses, no thyromegaly Respiratory: Poor inspiratory effort.  Occasional basal crackles.  Otherwise clear to auscultation without wheezing or rhonchi.  No increased work of breathing. Cardiovascular: S1 and S2 heard, RRR.  No JVD, murmurs or pedal edema. Abdomen: Nondistended, soft and nontender.  No organomegaly or masses appreciated.  Normal bowel sounds heard. Musculoskeletal: no clubbing / cyanosis. No joint deformity upper and lower extremities. Good ROM, no contractures.  Flaccid muscle tone.  Skin: no rashes, lesions, ulcers. No induration Neurologic: CN 2-12  grossly intact. Sensation intact, DTR normal. Strength 5/5 in all 4 limbs.  Psychiatric: Impaired judgment and insight. Alert and oriented x to self and family only.  Follows simple instructions.  Tracks activity around the room with her eyes.    Labs on Admission: I have personally reviewed following labs and imaging studies  CBC: Recent Labs  Lab 08/01/18 1608  WBC 12.7*  NEUTROABS 10.4*  HGB 11.6*  HCT 36.0  MCV 107.5*  PLT 110*   Basic Metabolic Panel: Recent Labs  Lab 08/01/18 1608  NA 135  K 4.1  CL 101  CO2 25  GLUCOSE 92  BUN 24*  CREATININE 0.82  CALCIUM 8.6*   Liver Function Tests: Recent Labs  Lab 08/01/18 1608  AST 19  ALT 15  ALKPHOS 66  BILITOT 0.4  PROT 6.9  ALBUMIN 3.0*   Urine analysis:    Component Value Date/Time   COLORURINE YELLOW 08/01/2018 1603   APPEARANCEUR CLEAR 08/01/2018 1603   APPEARANCEUR Turbid (A) 06/21/2018 1014   LABSPEC 1.011 08/01/2018 1603   PHURINE 6.0 08/01/2018 1603   GLUCOSEU NEGATIVE 08/01/2018 1603   HGBUR NEGATIVE 08/01/2018 1603   BILIRUBINUR NEGATIVE 08/01/2018 1603    BILIRUBINUR Negative 06/21/2018 1014   KETONESUR NEGATIVE 08/01/2018 1603   PROTEINUR NEGATIVE 08/01/2018 1603   NITRITE NEGATIVE 08/01/2018 1603   LEUKOCYTESUR NEGATIVE 08/01/2018 1603   LEUKOCYTESUR 3+ (A) 06/21/2018 1014     Radiological Exams on Admission: Dg Chest 2 View  Result Date: 08/01/2018 CLINICAL DATA:  Cough and altered mental status. EXAM: CHEST - 2 VIEW COMPARISON:  02/06/2018 and prior radiograph FINDINGS: Mild cardiomegaly and RIGHT pacemaker again noted. New RIGHT LOWER lung airspace opacity likely represents pneumonia. No pneumothorax or acute bony abnormality. Remote LEFT clavicle and rib fractures again identified. IMPRESSION: New RIGHT LOWER lung airspace opacity compatible with pneumonia. Radiographic follow-up to resolution recommended. Electronically Signed   By: Harmon PierJeffrey  Hu M.D.   On: 08/01/2018 15:38   Ct Head Wo Contrast  Result Date: 08/01/2018 CLINICAL DATA:  62 year old female with chronic headache and altered mental status. EXAM: CT HEAD WITHOUT CONTRAST TECHNIQUE: Contiguous axial images were obtained from the base of the skull through the vertex without intravenous contrast. COMPARISON:  05/31/2017 and 05/11/2016 CTs, and 02/01/2017 MR FINDINGS: Brain: Ventriculomegaly has slightly increased since 2018. Atrophy and probable chronic small-vessel white matter ischemic changes again noted. There is no evidence of midline shift, hemorrhage, acute infarct, mass or extra-axial collection. Vascular: Carotid atherosclerotic calcifications again noted. Skull: Normal. Negative for fracture or focal lesion. Sinuses/Orbits: Fluid in the RIGHT maxillary sinus again noted. Other: None IMPRESSION: 1. Slightly increased ventriculomegaly, which may be secondary to atrophy and white matter disease but communicating hydrocephalus is not excluded. 2. Atrophy, chronic small-vessel white matter ischemic changes and carotid atherosclerosis. 3. Fluid in the RIGHT maxillary sinus again  noted which may represent acute sinusitis. Electronically Signed   By: Harmon PierJeffrey  Hu M.D.   On: 08/01/2018 15:53     Assessment/Plan Principal Problem:   Aspiration pneumonia (HCC) Active Problems:   Down syndrome   Seizures (HCC)   Acquired hypothyroidism   History of pulmonary embolism   Pacemaker   Dehydration   Toxic metabolic encephalopathy     1. Suspected aspiration pneumonia: Likely related to increasing seizures.  Not hypoxic.  Continue IV Unasyn that was started in ED.  Aspiration precautions.  Mechanical soft diet.  As per discussion with sister, she has not had any issues with swallowing and  hence will defer swallow evaluation at this point. 2. Dehydration: Secondary to poor oral intake.  Brief IV fluids and encourage oral intake. 3. Seizure disorder: Patient was last seen at [facility] in November 2019.  Addition of Lamictal for increasing seizures at that time was suggested but family were reluctant.  Patient was supposed to see a second neurologist with [facility] today for a second opinion.  I discussed with neuro hospitalist in detail who recommended increasing Keppra XR from 1 g at bedtime to 1 g twice daily, continue current Dilantin 200 mg twice daily and as needed Ativan 1 mg every 6 hours as needed for seizures >5 minutes.  Follow serum Dilantin level to rule out toxicity. 4. Acute toxic metabolic encephalopathy: Likely related to increasing seizures, pneumonia and dehydration.  Treat underlying cause and monitor.  As per family, patient already is looking better. 5. Macrocytic anemia: Stable.  Macrocytosis may be related to Dilantin versus hypothyroidism.  Follow CBCs. 6. Thrombocytopenia: Appears intermittent and chronic.  No bleeding reported.  Follow CBCs. 7. Hypothyroidism: Continue Synthroid.  Check TSH. 8. Status post pacemaker: Follows with Dr. Johney Frame, [facility] cardiology. 9. OSA on nightly CPAP: Follows with Dr. Kendrick Fries, [place] [facility].   Continue. 10. Down syndrome with cognitive impairment   DVT prophylaxis: Lovenox Code Status: Full.  This was discussed in detail with sister who confirmed. Family Communication: Discussed in detail with patient's sister/HCPOA and her husband at bedside. Disposition Plan: DC home pending clinical improvement. Consults called: None Admission status: Inpatient, telemetry.   Marcellus Scott MD [facility] Pager [phone]  If 7PM-7AM, please contact night-coverage [email or web address] Password [email or web address]  08/01/2018, 6:18 PM

## 2018-08-02 LAB — CBC
HCT: 33.8 % — ABNORMAL LOW (ref 36.0–46.0)
Hemoglobin: 10.6 g/dL — ABNORMAL LOW (ref 12.0–15.0)
MCH: 35 pg — ABNORMAL HIGH (ref 26.0–34.0)
MCHC: 31.4 g/dL (ref 30.0–36.0)
MCV: 111.6 fL — ABNORMAL HIGH (ref 80.0–100.0)
NRBC: 0 % (ref 0.0–0.2)
Platelets: 93 10*3/uL — ABNORMAL LOW (ref 150–400)
RBC: 3.03 MIL/uL — ABNORMAL LOW (ref 3.87–5.11)
RDW: 12 % (ref 11.5–15.5)
WBC: 8.7 10*3/uL (ref 4.0–10.5)

## 2018-08-02 LAB — BASIC METABOLIC PANEL
Anion gap: 7 (ref 5–15)
BUN: 19 mg/dL (ref 8–23)
CO2: 27 mmol/L (ref 22–32)
CREATININE: 0.69 mg/dL (ref 0.44–1.00)
Calcium: 8.3 mg/dL — ABNORMAL LOW (ref 8.9–10.3)
Chloride: 106 mmol/L (ref 98–111)
GFR calc Af Amer: >60 mL/min (ref >60)
GFR calc non Af Amer: >60 mL/min (ref >60)
Glucose, Bld: 105 mg/dL — ABNORMAL HIGH (ref 70–99)
Potassium: 3.8 mmol/L (ref 3.5–5.1)
Sodium: 140 mmol/L (ref 135–145)

## 2018-08-02 LAB — HIV ANTIBODY (ROUTINE TESTING W REFLEX): HIV Screen 4th Generation wRfx: NONREACTIVE

## 2018-08-02 LAB — TSH: TSH: 0.418 u[IU]/mL (ref 0.350–4.500)

## 2018-08-02 MED ORDER — SODIUM CHLORIDE 0.9 % IV SOLN: INTRAVENOUS | Status: AC

## 2018-08-02 NOTE — Progress Notes (Signed)
Requested by patient's sister that she wanted patient to be evaluated by speech therapist and possible swallow study. Will put speech consult in. No complaints. Will continue to monitor patient.

## 2018-08-02 NOTE — Progress Notes (Signed)
PROGRESS NOTE   Kaitlyn MccreedyJeanne L Mazo  ZOX:[id number]    DOB: [birth year]    DOA: 08/01/2018  PCP: Tracey HarriesBouska, David, MD   I have briefly reviewed patients previous medical records in [facility].  Brief Narrative:  Kaitlyn Powell is a very pleasant 62 year old female patient, lives with her sister Youth worker(HCPOA) and brother-in-law, ambulates with the help of their assistance but does not use a cane or a walker, PMH of Down syndrome with mental retardation, seizure disorder, aspiration pneumonia, dysphagia- off of thickener for more than a year and tolerating regular diet, heart block status post pacemaker, GERD, hypothyroid, PE completed anticoagulation, OSA on CPAP with 3 L oxygen, [facility] on 08/01/2018 with complaints of increasing seizures, cough and altered mental status.  She was admitted for right lower lobe aspiration pneumonia, dehydration, seizures and encephalopathy.  Improving.   Assessment & Plan:   Principal Problem:   Aspiration pneumonia (HCC) Active Problems:   Down syndrome   Seizures (HCC)   Acquired hypothyroidism   History of pulmonary embolism   Pacemaker   Dehydration   Toxic metabolic encephalopathy    1. Suspected aspiration pneumonia: Likely during episodes of seizures and associated somnolence.  Not hypoxic.  Continue IV Unasyn that was started in ED.  Aspiration precautions.  Mechanical soft diet.    Family are now interested in getting speech therapy evaluation for swallowing.  Recommend repeating chest x-ray in 4 weeks to ensure resolution of pneumonia findings. 2. Dehydration: Secondary to poor oral intake.    Improving.  Continue gentle IV fluids until oral intake improves. 3. Seizure disorder: [facility] November 2019.  Addition of Lamictal for increasing seizures at that time was suggested but family were reluctant.  Patient was supposed to see a second neurologist with [place] on day of admission  for a second opinion but was unable to.  I discussed with neuro hospitalist in detail on 12/30 who recommended increasing Keppra XR from 1 g at bedtime to 1 g twice daily, continue current Dilantin 200 mg twice daily and as needed Ativan 1 mg every 6 hours as needed for seizures >5 minutes.    Dilantin and Keppra levels pending.  No further seizures since hospital admission. 4. Acute toxic metabolic encephalopathy: Likely related to increasing seizures, pneumonia and dehydration.  Treat underlying cause and monitor.  Improving. 5. Macrocytic anemia: Macrocytosis may be related to Dilantin versus hypothyroidism. Relatively stable.  Continue to follow CBC. 6. Thrombocytopenia: Appears intermittent and chronic.  No bleeding reported.  Follow CBCs. 7. Hypothyroidism: Continue Synthroid.  Check TSH. 8. Status post pacemaker: Follows with Dr. Johney FrameAllred, [facility] cardiology. 9. OSA on nightly CPAP: Follows with Dr. Kendrick FriesMcQuaid, [place] [facility].  Continue. 10. Down syndrome with cognitive impairment   DVT prophylaxis: Lovenox Code Status: Full Family Communication: Disussed in detail with patient's sister/HCPOA and her husband (works at [employer] in [employer]) at bedside in detail, updated care and answer questions. Disposition: Continue IV antibiotics, new AED regimen for another day, monitor closely for recurrent seizures, speech therapy to evaluate swallowing and possible discharge in a.m.   Consultants:  None  Procedures:  None  Antimicrobials:  IV Unasyn   Subjective: Alert and oriented x1.  As per family at bedside, looks much better, has more color, more interactive.  No dyspnea.  No seizures reported overnight.  ROS: As above.  Objective:  Vitals:   08/01/18 1800 08/01/18 1858 08/01/18 1955 08/02/18  0531  BP: (!) 105/46 (!) 120/55 136/72 114/65  Pulse: 68 66 73 76  Resp: 19 16 16 16   Temp:  98.4 F (36.9 C) 99.1 F (37.3 C) (!) 97.4 F (36.3 C)  TempSrc:  Oral  Oral  SpO2: 96% 92%  92% 100%    Examination:  General exam: Pleasant middle-aged female, small built and moderately nourished lying comfortably propped up in bed.  Appears to be in good spirits.  Smiling.  Has Down's facies Respiratory system: Occasional basal crackles but otherwise clear to auscultation. Respiratory effort normal. Cardiovascular system: S1 & S2 heard, RRR. No JVD, murmurs, rubs, gallops or clicks. No pedal edema.  Telemetry personally reviewed: Sinus rhythm- SB in the 50s and on demand atrial pacing. Gastrointestinal system: Abdomen is nondistended, soft and nontender. No organomegaly or masses felt. Normal bowel sounds heard. Central nervous system: Alert and oriented x1.  Follows simple instructions. No focal neurological deficits. Extremities: Symmetric 5 x 5 power. Skin: No rashes, lesions or ulcers Psychiatry: Judgement and insight impaired. Mood & affect pleasant.     Data Reviewed: I have personally reviewed following labs and imaging studies  CBC: Recent Labs  Lab 08/01/18 1608 08/02/18 0657  WBC 12.7* 8.7  NEUTROABS 10.4*  --   HGB 11.6* 10.6*  HCT 36.0 33.8*  MCV 107.5* 111.6*  PLT 110* 93*   Basic Metabolic Panel: Recent Labs  Lab 08/01/18 1608 08/02/18 0657  NA 135 140  K 4.1 3.8  CL 101 106  CO2 25 27  GLUCOSE 92 105*  BUN 24* 19  CREATININE 0.82 0.69  CALCIUM 8.6* 8.3*   Liver Function Tests: Recent Labs  Lab 08/01/18 1608  AST 19  ALT 15  ALKPHOS 66  BILITOT 0.4  PROT 6.9  ALBUMIN 3.0*    No results found for this or any previous visit (from the past 240 hour(s)).       Radiology Studies: Dg Chest 2 View  Result Date: 08/01/2018 CLINICAL DATA:  Cough and altered mental status. EXAM: CHEST - 2 VIEW COMPARISON:  02/06/2018 and prior radiograph FINDINGS: Mild cardiomegaly and RIGHT pacemaker again noted. New RIGHT LOWER lung airspace opacity likely represents pneumonia. No pneumothorax or acute bony abnormality. Remote LEFT clavicle and rib  fractures again identified. IMPRESSION: New RIGHT LOWER lung airspace opacity compatible with pneumonia. Radiographic follow-up to resolution recommended. Electronically Signed   By: Harmon PierJeffrey  Hu M.D.   On: 08/01/2018 15:38   Ct Head Wo Contrast  Result Date: 08/01/2018 CLINICAL DATA:  62 year old female with chronic headache and altered mental status. EXAM: CT HEAD WITHOUT CONTRAST TECHNIQUE: Contiguous axial images were obtained from the base of the skull through the vertex without intravenous contrast. COMPARISON:  05/31/2017 and 05/11/2016 CTs, and 02/01/2017 MR FINDINGS: Brain: Ventriculomegaly has slightly increased since 2018. Atrophy and probable chronic small-vessel white matter ischemic changes again noted. There is no evidence of midline shift, hemorrhage, acute infarct, mass or extra-axial collection. Vascular: Carotid atherosclerotic calcifications again noted. Skull: Normal. Negative for fracture or focal lesion. Sinuses/Orbits: Fluid in the RIGHT maxillary sinus again noted. Other: None IMPRESSION: 1. Slightly increased ventriculomegaly, which may be secondary to atrophy and white matter disease but communicating hydrocephalus is not excluded. 2. Atrophy, chronic small-vessel white matter ischemic changes and carotid atherosclerosis. 3. Fluid in the RIGHT maxillary sinus again noted which may represent acute sinusitis. Electronically Signed   By: Harmon PierJeffrey  Hu M.D.   On: 08/01/2018 15:53        Scheduled  Meds: . citalopram  20 mg Oral Daily  . enoxaparin (LOVENOX) injection  40 mg Subcutaneous Q24H  . folic acid  1 mg Oral Daily  . levETIRAcetam  1,000 mg Oral BID  . levothyroxine  150 mcg Oral QAC breakfast  . phenytoin  200 mg Oral BID  . risperiDONE  1 mg Oral Daily   And  . risperiDONE  2 mg Oral QHS  . simvastatin  40 mg Oral QHS  . sodium chloride flush  3 mL Intravenous Q12H  . cyanocobalamin  100 mcg Oral Daily   Continuous Infusions: . ampicillin-sulbactam (UNASYN) IV  3 g (08/02/18 0548)     LOS: 1 day     Marcellus Scott, MD, FACP, [facility]. [facility] Pager [phone]  If 7PM-7AM, please contact night-coverage [email or web address] Password [facility] 08/02/2018, 8:53 AM

## 2018-08-02 NOTE — Evaluation (Signed)
l Clinical/Bedside Swallow Evaluation Patient Details  Name: Kaitlyn Powell MRN: [id number] Date of Birth: [birth year]  Today's Date: 08/02/2018 Time: SLP Start Time (ACUTE ONLY): 1020 SLP Stop Time (ACUTE ONLY): 1035 SLP Time Calculation (min) (ACUTE ONLY): 15 min  Past Medical History:  Past Medical History:  Diagnosis Date  . Arthritis   . Aspiration pneumonia (HCC)   . Asthma   . Cardiac arrhythmia    have pacemeaker  . Down syndrome   . GERD (gastroesophageal reflux disease)   . Heart block   . Hypothyroidism   . Mental retardation   . Pacemaker   . Pulmonary emboli (HCC)   . Seizures (HCC)   . Sleep apnea with use of continuous positive airway pressure (CPAP)    Past Surgical History:  Past Surgical History:  Procedure Laterality Date  . PACEMAKER INSERTION  12/09/2015   [employer] Accolade MRI EL PPM implanted in PA for complete heart block and syncope  . PEG TUBE PLACEMENT     removed in 2017  . TIBIA FRACTURE SURGERY Right    HPI:   62 year old female patient, lives with her sister Youth worker(HCPOA) and brother-in-law, ambulates with the help of their assistance but does not use a cane or a walker, PMH of Down syndrome, seizure disorder, aspiration pneumonia, dysphagia, resolved - off of thickener for more than a year and tolerating regular diet, remote hx PEG (removed 2017 after swallowing issues were resolved), heart block status post pacemaker, GERD, hypothyroid, PE completed anticoagulation, OSA on CPAP with 3 L oxygen, [facility] 08/01/2018 with increasing seizures, AMS, cough.   Assessment / Plan / Recommendation Clinical Impression  Pt presents with a quite functional oropharyngeal swallow with adequate mastication/control of soft solids/liquids, appearance of brisk swallow response, no s/s of aspiration after consumption of large, successive boluses of water.  Passed three oz water screen. Pt alert, pleasant, attentive to POs - no  indications of a dysphagia.  D/W pt's sister and brother-in-law.  Continue current soft diet, thin liquids; no SLP f/u warranted.  Our service will sign off.  SLP Visit Diagnosis: Dysphagia, unspecified (R13.10)    Aspiration Risk  No limitations    Diet Recommendation     Medication Administration: Whole meds with liquid    Other  Recommendations Oral Care Recommendations: Oral care BID   Follow up Recommendations None      Frequency and Duration            Prognosis        Swallow Study   General HPI:  62 year old female patient, lives with her sister Youth worker(HCPOA) and brother-in-law, ambulates with the help of their assistance but does not use a cane or a walker, PMH of Down syndrome, seizure disorder, aspiration pneumonia, dysphagia, resolved - off of thickener for more than a year and tolerating regular diet, remote hx PEG (removed 2017 after swallowing issues were resolved), heart block status post pacemaker, GERD, hypothyroid, PE completed anticoagulation, OSA on CPAP with 3 L oxygen, [facility] 08[facility]01/2018 with increasing seizures, AMS, cough. Type of Study: Bedside Swallow Evaluation Previous Swallow Assessment: no Diet Prior to this Study: Dysphagia 3 (soft);Thin liquids Temperature Spikes Noted: No Respiratory Status: Nasal cannula History of Recent Intubation: No Behavior/Cognition: Alert;Cooperative Oral Cavity Assessment: Within Functional Limits Oral Care Completed by SLP: No Oral Cavity - Dentition: Missing dentition Vision: Functional for self-feeding Self-Feeding Abilities: Able to feed self Patient Positioning: Upright in bed Baseline Vocal  Quality: Normal Volitional Cough: Strong Volitional Swallow: Able to elicit    Oral/Motor/Sensory Function Overall Oral Motor/Sensory Function: Within functional limits   Ice Chips Ice chips: Within functional limits   Thin Liquid Thin Liquid: Within functional limits    Nectar Thick Nectar Thick  Liquid: Not tested   Honey Thick Honey Thick Liquid: Not tested   Puree Puree: Within functional limits   Solid     Solid: Not tested      Blenda Mountsouture, Chris Narasimhan Laurice 08/02/2018,10:38 AM   Marchelle FolksAmanda L. Samson Fredericouture, MA CCC/SLP Acute Rehabilitation Services Office number [phone] Pager [phone]

## 2018-08-02 NOTE — Evaluation (Signed)
Occupational Therapy Evaluation Patient Details Name: Kaitlyn Powell MRN: [id number] DOB: 10/16/1955 Today's Date: 08/02/2018    History of Present Illness This 62 year old female was admitted for aspiration pna, seizures, and AMS. She has a h/o Down's syndrome and pacemaker   Clinical Impression   Pt was admitted for the above. Per chart, pt ambulates with hand held assist.  Unsure of how much she participates in ADLs.  Pt has IV placed at R wrist and encouraged her to limit use as extending wrist places pressure along site. 2 goals (min A) are listed below. Will update when we talk to family    Follow Up Recommendations  Supervision/Assistance - 24 hour    Equipment Recommendations  (to be assessed further)    Recommendations for Other Services       Precautions / Restrictions Precautions Precautions: Fall Restrictions Weight Bearing Restrictions: No      Mobility Bed Mobility Overal bed mobility: Needs Assistance             General bed mobility comments: mod A +2 to EOB; delayed initiation  Transfers Overall transfer level: Needs assistance Equipment used: 2 person hand held assist Transfers: Sit to/from [employer];Stand Pivot Transfers Sit to Stand: Mod assist;+2 physical assistance Stand pivot transfers: Mod assist;+2 physical assistance       General transfer comment: assist to rise and stablize. Pt sat quickly before completing entire turn    Balance                                           ADL either performed or assessed with clinical judgement   ADL Overall ADL's : Needs assistance/impaired   Eating/Feeding Details (indicate cue type and reason): pt here with aspiration pna--did not feed                     Toilet Transfer: Moderate assistance;+2 for physical assistance;Stand-pivot(to chair)             General ADL Comments: family not present at time of evaluation.  Per NT, family has been assisting with adls.   Unsure about PLOF and if pt is continent.  She did not want to sit on toilet.  Based on clinical judgment:  mod A for UB adls and max/total for LB     Vision         Perception     Praxis      Pertinent Vitals/Pain Pain Assessment: Faces Faces Pain Scale: Hurts a little bit Pain Location: stomach Pain Descriptors / Indicators: Aching Pain Intervention(s): Limited activity within patient's tolerance;Monitored during session     Hand Dominance     Extremity/Trunk Assessment Upper Extremity Assessment Upper Extremity Assessment: Generalized weakness;RUE deficits/detail RUE Deficits / Details: IV placed at wrist; discouraged use to avoid pressure when wrist extended           Communication Communication Communication: No difficulties   Cognition Arousal/Alertness: Awake/alert Behavior During Therapy: WFL for tasks assessed/performed                                   General Comments: h/o Down's admitted with AMS. Follows one step commands in context with delayed processing   General Comments       Exercises     Shoulder Instructions  Home Living Family/patient expects to be discharged to:: Private residence Living Arrangements: Other relatives Available Help at Discharge: Family                                    Prior Functioning/Environment          Comments: unsure of PLOF.  Per chart, ambulated with assistance, without AD        OT Problem List: Decreased strength;Decreased activity tolerance;Impaired balance (sitting and/or standing);Decreased safety awareness;Decreased cognition;Pain      OT Treatment/Interventions: Self-care/ADL training;DME and/or AE instruction;Patient/family education;Balance training;Therapeutic activities;Cognitive remediation/compensation    OT Goals(Current goals can be found in the care plan section) Acute Rehab OT Goals Patient Stated Goal: unable to state OT Goal Formulation: With  patient Time For Goal Achievement: 08/16/18 Potential to Achieve Goals: Fair ADL Goals Pt Will Transfer to Toilet: with min assist;ambulating;bedside commode Additional ADL Goal #1: pt will go from sit to stand with min A and maintain x 2 minutes at this level for adls  OT Frequency: Min 2X/week   Barriers to D/C:            Co-evaluation PT/OT/SLP Co-Evaluation/Treatment: Yes Reason for Co-Treatment: For patient/therapist safety PT goals addressed during session: Mobility/safety with mobility OT goals addressed during session: ADL's and self-care      AM-PAC OT "6 Clicks" Daily Activity     Outcome Measure Help from another person eating meals?: A Little Help from another person taking care of personal grooming?: A Little Help from another person toileting, which includes using toliet, bedpan, or urinal?: A Lot Help from another person bathing (including washing, rinsing, drying)?: A Lot Help from another person to put on and taking off regular upper body clothing?: A Lot Help from another person to put on and taking off regular lower body clothing?: Total 6 Click Score: 13   End of Session Nurse Communication: Mobility status  Activity Tolerance: Patient tolerated treatment well Patient left: in chair;with call bell/phone within reach;with chair alarm set  OT Visit Diagnosis: Unsteadiness on feet (R26.81);Muscle weakness (generalized) (M62.81)                Time: [id number] OT Time Calculation (min): 18 min Charges:  OT General Charges $OT Visit: 1 Visit OT Evaluation $OT Eval Low Complexity: 1 Low  Kaitlyn Powell, OTR/L Acute Rehabilitation Services [phone] [facility] pager [phone] office 08/02/2018  Kaitlyn Powell 08/02/2018, 11:59 AM

## 2018-08-02 NOTE — Evaluation (Addendum)
Physical Therapy Evaluation Patient Details Name: Deandra L Melendrez MRN: [name] DOB: 06-24-1956 Today's Date: 08/02/2018   History of Present Illness  This 62 year old female was admitted for aspiration pna, seizures, and AMS. She has a h/o Down's syndrome and pacemaker, seizures.  Clinical Impression  The patient  Required 2 assist today to transfer to recliner. Family not present to discuss prior level of function and if caregivers are present 24/7. Pt admitted with above diagnosis. Pt currently with functional limitations due to the deficits listed below (see PT Problem List).  Pt will benefit from skilled PT to increase their independence and safety with mobility to allow discharge to the venue listed below.       Follow Up Recommendations (tba, NEED FAMILY INPUT)    Equipment Recommendations  (TBD)    Recommendations for Other Services       Precautions / Restrictions Precautions Precautions: Fall Restrictions Weight Bearing Restrictions: No      Mobility  Bed Mobility Overal bed mobility: Needs Assistance             General bed mobility comments: mod A +2 to EOB; delayed initiation  Transfers Overall transfer level: Needs assistance Equipment used: 2 person hand held assist Transfers: Sit to/from [employer];Stand Pivot Transfers Sit to Stand: Mod assist;+2 physical assistance Stand pivot transfers: Mod assist;+2 physical assistance       General transfer comment: assist to rise and stablize. Pt sat quickly before completing entire turn  Ambulation/Gait                Stairs            Wheelchair Mobility    Modified Rankin (Stroke Patients Only)       Balance                                             Pertinent Vitals/Pain Pain Assessment: Faces Faces Pain Scale: Hurts a little bit Pain Location: stomach Pain Descriptors / Indicators: Aching Pain Intervention(s): Limited activity within patient's tolerance     Home Living Family/patient expects to be discharged to:: Private residence Living Arrangements: Other relatives Available Help at Discharge: Family                  Prior Function           Comments: unsure of PLOF.  Per chart, ambulated with assistance, without AD     Hand Dominance        Extremity/Trunk Assessment   Upper Extremity Assessment Upper Extremity Assessment: Defer to OT evaluation RUE Deficits / Details: IV placed at wrist; discouraged use to avoid pressure when wrist extended    Lower Extremity Assessment Lower Extremity Assessment: Generalized weakness    Cervical / Trunk Assessment Cervical / Trunk Assessment: Normal  Communication   Communication: No difficulties  Cognition Arousal/Alertness: Awake/alert Behavior During Therapy: WFL for tasks assessed/performed Overall Cognitive Status: No family/caregiver present to determine baseline cognitive functioning                                 General Comments: h/o Down's admitted with AMS. Follows one step commands in context with delayed processing      General Comments      Exercises     Assessment/Plan    PT  Assessment Patient needs continued PT services  PT Problem List Decreased strength;Decreased activity tolerance;Decreased mobility;Decreased safety awareness;Decreased knowledge of use of DME;Decreased cognition;Pain       PT Treatment Interventions DME instruction;Gait training;Functional mobility training;Therapeutic activities;Patient/family education    PT Goals (Current goals can be found in the Care Plan section)  Acute Rehab PT Goals Patient Stated Goal: unable to state PT Goal Formulation: Patient unable to participate in goal setting Time For Goal Achievement: 09/02/18 Potential to Achieve Goals: Good    Frequency Min 2X/week   Barriers to discharge        Co-evaluation PT/OT/SLP Co-Evaluation/Treatment: Yes Reason for Co-Treatment: For  patient/therapist safety PT goals addressed during session: Mobility/safety with mobility OT goals addressed during session: ADL's and self-care       AM-PAC PT "6 Clicks" Mobility  Outcome Measure Help needed turning from your back to your side while in a flat bed without using bedrails?: Total Help needed moving from lying on your back to sitting on the side of a flat bed without using bedrails?: Total Help needed moving to and from a bed to a chair (including a wheelchair)?: Total Help needed standing up from a chair using your arms (e.g., wheelchair or bedside chair)?: Total Help needed to walk in hospital room?: Total Help needed climbing 3-5 steps with a railing? : Total 6 Click Score: 6    End of Session   Activity Tolerance: Patient tolerated treatment well Patient left: in chair;with call bell/phone within reach;with chair alarm set Nurse Communication: Mobility status PT Visit Diagnosis: Unsteadiness on feet (R26.81)    Time: [id number] PT Time Calculation (min) (ACUTE ONLY): 23 min   Charges:   PT Evaluation $PT Eval Low Complexity: 1 Low          Blanchard KelchKaren Sahand Gosch PT Acute Rehabilitation Services Pager [phone] Office [phone]   Rada HayHill, Elvera Almario Elizabeth 08/02/2018, 1:14 PM

## 2018-08-02 NOTE — ED Provider Notes (Signed)
[facility] Provider Note   CSN: [id number] Arrival date & time: 08/01/18  1257     History   Chief Complaint Chief Complaint  Patient presents with  . Cough  . flu like symptoms    HPI Kaitlyn Powell is a 62 y.o. female.   Cough  This is a new problem. The current episode started more than 2 days ago. The problem occurs constantly. The cough is non-productive. There has been no fever. Pertinent negatives include no chest pain and no sweats. She has tried nothing for the symptoms. The treatment provided no relief. She is not a smoker.    Past Medical History:  Diagnosis Date  . Arthritis   . Aspiration pneumonia (HCC)   . Asthma   . Cardiac arrhythmia    have pacemeaker  . Down syndrome   . GERD (gastroesophageal reflux disease)   . Heart block   . Hypothyroidism   . Mental retardation   . Pacemaker   . Pulmonary emboli (HCC)   . Seizures (HCC)   . Sleep apnea with use of continuous positive airway pressure (CPAP)     Patient Active Problem List   Diagnosis Date Noted  . Aspiration pneumonia (HCC) 08/01/2018  . Dehydration 08/01/2018  . Toxic metabolic encephalopathy 08/01/2018  . Functional urinary incontinence 07/22/2017  . OSA treated with BiPAP 12/31/2016  . Mixed conductive and sensorineural hearing loss of both ears 08/05/2016  . Seizures (HCC) 07/03/2016  . Bilateral hearing loss 07/03/2016  . Down syndrome 05/10/2016  . Anemia due to medication 05/10/2016  . Acquired hypothyroidism 05/10/2016  . Elevated lipase 05/10/2016  . History of pulmonary embolism 05/10/2016  . G tube feedings (HCC) 05/10/2016  . Pacemaker 05/10/2016  . S/P percutaneous endoscopic gastrostomy (PEG) tube placement (HCC) 05/10/2016    Past Surgical History:  Procedure Laterality Date  . PACEMAKER INSERTION  12/09/2015   [employer] Accolade MRI EL PPM implanted in PA for complete heart block and syncope  . PEG TUBE  PLACEMENT     removed in 2017  . TIBIA FRACTURE SURGERY Right      OB History   No obstetric history on file.      Home Medications    Prior to Admission medications   Medication Sig Start Date End Date Taking? Authorizing Provider  citalopram (CELEXA) 20 MG tablet Take 20 mg by mouth daily.   Yes [provider]  cyanocobalamin (TH VITAMIN B12) 100 MCG tablet Take 100 mcg by mouth every morning.    Yes [provider]  folic acid (FOLVITE) 1 MG tablet Take 1 mg  by mouth  daily   Yes [provider]  levETIRAcetam (KEPPRA XR) 500 MG 24 hr tablet Take 3 tablets (1,500 mg total) by mouth daily. Patient taking differently: Take 1,000 mg by mouth every evening.  04/13/18  Yes Butch Penny, NP  levothyroxine (SYNTHROID, LEVOTHROID) 150 MCG tablet Take 150 mcg by mouth daily before breakfast.   Yes [provider]  OXYGEN Inhale 3 L into the lungs as directed. With CPAP    Yes [provider]  phenytoin (DILANTIN) 200 MG ER capsule Take 1 capsule (200 mg total) by mouth 2 (two) times daily. 07/16/17  Yes Butch Penny, NP  risperiDONE (RISPERDAL) 1 MG tablet Take 1 mg by mouth every morning and 2 mg at night   Yes [provider]  simvastatin (ZOCOR) 40 MG tablet Take 40 mg  by mouth daily at bedtime 04/22/18  Yes Allred, Fayrene Fearing, MD  fluticasone (FLONASE) 50 MCG/ACT nasal spray PLACE 2 SPRAYS DAILY INTO BOTH NOSTRILS. Patient not taking: Reported on 08/01/2018 06/13/18   Lupita Leash, MD  meloxicam (MOBIC) 15 MG tablet TAKE ONE TABLET (15 MG DOSE) BY MOUTH DAILY. 04/16/18   [provider]    Family History Family History  Problem Relation Age of Onset  . Diabetes Father   . Heart disease Father   . Breast cancer Sister     Social History Social History   Tobacco Use  . Smoking status: Never Smoker  . Smokeless tobacco: Never Used  Substance Use Topics  . Alcohol use: No  . Drug use: No     Allergies     Cephalexin and Namenda [memantine hcl]   Review of Systems Review of Systems  Unable to perform ROS: Dementia  Respiratory: Positive for cough.   Cardiovascular: Negative for chest pain.     Physical Exam Updated Vital Signs BP (!) 129/56 Comment: RN notified  Pulse 63   Temp 98.1 F (36.7 C) (Oral)   Resp 18   SpO2 96%   Physical Exam Vitals signs and nursing note reviewed.  Constitutional:      Appearance: She is well-developed.     Comments: Alert to voice, nonverbal at this time  HENT:     Head: Normocephalic and atraumatic.  Eyes:     Extraocular Movements: Extraocular movements intact.     Conjunctiva/sclera: Conjunctivae normal.  Neck:     Musculoskeletal: Normal range of motion.  Cardiovascular:     Rate and Rhythm: Normal rate and regular rhythm.  Pulmonary:     Effort: Tachypnea present. No respiratory distress.     Breath sounds: No stridor.  Abdominal:     General: There is no distension.  Musculoskeletal: Normal range of motion.        General: No swelling or tenderness.  Skin:    General: Skin is warm and dry.  Neurological:     General: No focal deficit present.     Mental Status: She is alert.      ED Treatments / Results  Labs (all labs ordered are listed, but only abnormal results are displayed) Labs Reviewed  CBC WITH DIFFERENTIAL/PLATELET - Abnormal; Notable for the following components:      Result Value   WBC 12.7 (*)    RBC 3.35 (*)    Hemoglobin 11.6 (*)    MCV 107.5 (*)    MCH 34.6 (*)    Platelets 110 (*)    Neutro Abs 10.4 (*)    Abs Immature Granulocytes 0.11 (*)    All other components within normal limits  COMPREHENSIVE METABOLIC PANEL - Abnormal; Notable for the following components:   BUN 24 (*)    Calcium 8.6 (*)    Albumin 3.0 (*)    All other components within normal limits  BASIC METABOLIC PANEL - Abnormal; Notable for the following components:   Glucose, Bld 105 (*)    Calcium 8.3 (*)    All other  components within normal limits  CBC - Abnormal; Notable for the following components:   RBC 3.03 (*)    Hemoglobin 10.6 (*)    HCT 33.8 (*)    MCV 111.6 (*)    MCH 35.0 (*)    Platelets 93 (*)    All other components within normal limits  URINALYSIS, ROUTINE W REFLEX MICROSCOPIC  INFLUENZA PANEL BY  PCR (TYPE A & B)  HIV ANTIBODY (ROUTINE TESTING W REFLEX)  TSH  PHENYTOIN LEVEL, FREE AND TOTAL  LEVETIRACETAM LEVEL    EKG None  Radiology Dg Chest 2 View  Result Date: 08/01/2018 CLINICAL DATA:  Cough and altered mental status. EXAM: CHEST - 2 VIEW COMPARISON:  02/06/2018 and prior radiograph FINDINGS: Mild cardiomegaly and RIGHT pacemaker again noted. New RIGHT LOWER lung airspace opacity likely represents pneumonia. No pneumothorax or acute bony abnormality. Remote LEFT clavicle and rib fractures again identified. IMPRESSION: New RIGHT LOWER lung airspace opacity compatible with pneumonia. Radiographic follow-up to resolution recommended. Electronically Signed   By: Harmon PierJeffrey  Hu M.D.   On: 08/01/2018 15:38   Ct Head Wo Contrast  Result Date: 08/01/2018 CLINICAL DATA:  62 year old female with chronic headache and altered mental status. EXAM: CT HEAD WITHOUT CONTRAST TECHNIQUE: Contiguous axial images were obtained from the base of the skull through the vertex without intravenous contrast. COMPARISON:  05/31/2017 and 05/11/2016 CTs, and 02/01/2017 MR FINDINGS: Brain: Ventriculomegaly has slightly increased since 2018. Atrophy and probable chronic small-vessel white matter ischemic changes again noted. There is no evidence of midline shift, hemorrhage, acute infarct, mass or extra-axial collection. Vascular: Carotid atherosclerotic calcifications again noted. Skull: Normal. Negative for fracture or focal lesion. Sinuses/Orbits: Fluid in the RIGHT maxillary sinus again noted. Other: None IMPRESSION: 1. Slightly increased ventriculomegaly, which may be secondary to atrophy and white matter  disease but communicating hydrocephalus is not excluded. 2. Atrophy, chronic small-vessel white matter ischemic changes and carotid atherosclerosis. 3. Fluid in the RIGHT maxillary sinus again noted which may represent acute sinusitis. Electronically Signed   By: Harmon PierJeffrey  Hu M.D.   On: 08/01/2018 15:53    Procedures Procedures (including critical care time)  Medications Ordered in ED Medications  simvastatin (ZOCOR) tablet 40 mg (40 mg Oral Given 08/01/18 2022)  citalopram (CELEXA) tablet 20 mg (20 mg Oral Given 08/02/18 1027)  levothyroxine (SYNTHROID, LEVOTHROID) tablet 150 mcg (150 mcg Oral Given 08/02/18 0548)  vitamin B-12 (CYANOCOBALAMIN) tablet 100 mcg (100 mcg Oral Given 08/02/18 1027)  folic acid (FOLVITE) tablet 1 mg (1 mg Oral Given 08/02/18 1027)  phenytoin (DILANTIN) ER capsule 200 mg (200 mg Oral Given 08/02/18 1027)  enoxaparin (LOVENOX) injection 40 mg (40 mg Subcutaneous Given 08/01/18 2023)  sodium chloride flush (NS) 0.9 % injection 3 mL (3 mLs Intravenous Not Given 08/02/18 1227)  0.9 %  sodium chloride infusion ( Intravenous Rate/Dose Verify 08/02/18 0348)  acetaminophen (TYLENOL) tablet 650 mg (has no administration in time range)    Or  acetaminophen (TYLENOL) suppository 650 mg (has no administration in time range)  senna-docusate (Senokot-S) tablet 1 tablet (has no administration in time range)  albuterol (PROVENTIL) (2.5 MG/3ML) 0.083% nebulizer solution 2.5 mg (has no administration in time range)  levETIRAcetam (KEPPRA XR) 24 hr tablet 1,000 mg (1,000 mg Oral Given 08/02/18 1027)  LORazepam (ATIVAN) injection 1 mg (has no administration in time range)  risperiDONE (RISPERDAL) tablet 1 mg (1 mg Oral Given 08/02/18 1027)    And  risperiDONE (RISPERDAL) tablet 2 mg (2 mg Oral Given 08/01/18 2022)  Ampicillin-Sulbactam (UNASYN) 3 g in sodium chloride 0.9 % 100 mL IVPB (3 g Intravenous New Bag/Given 08/02/18 1226)  0.9 %  sodium chloride infusion ( Intravenous  Rate/Dose Change 08/02/18 0900)  lactated ringers bolus 1,000 mL (0 mLs Intravenous Stopped 08/01/18 1736)  Ampicillin-Sulbactam (UNASYN) 3 g in sodium chloride 0.9 % 100 mL IVPB (0 g Intravenous Stopped 08/01/18 1811)  Initial Impression / Assessment and Plan / ED Course  I have reviewed the triage vital signs and the nursing notes.  Pertinent labs & imaging results that were available during my care of the patient were reviewed by me and considered in my medical decision making (see chart for details).     Patient here with altered mental status and cough.  Found to have pneumonia.  CT of her head was done secondary to her history and showed some ventriculomegaly.  I discussed this with Dr. Amada JupiterKirkpatrick with neurology who felt was probably related to brain atrophy and dementia.  Patient is on antiepileptics for drop seizures and he recommended increasing that if she is having increased frequency of her seizures.  He recommend increasing from [phone].  Started on antibiotics.  Admit to hospitalist for altered mental status with infection.  Final Clinical Impressions(s) / ED Diagnoses   Final diagnoses:  Community acquired pneumonia of right lower lobe of lung (HCC)  Seizures [facility](HCC)    ED Discharge Orders    None       Valincia Touch, Barbara CowerJason, MD 08/03/18 [phone]

## 2018-08-03 LAB — CBC
HCT: 31.8 % — ABNORMAL LOW (ref 36.0–46.0)
Hemoglobin: 11.5 g/dL — ABNORMAL LOW (ref 12.0–15.0)
MCH: 35.5 pg — ABNORMAL HIGH (ref 26.0–34.0)
MCHC: 36.2 g/dL — AB (ref 30.0–36.0)
MCV: 98.1 fL (ref 80.0–100.0)
Platelets: 97 10*3/uL — ABNORMAL LOW (ref 150–400)
RBC: 3.24 MIL/uL — ABNORMAL LOW (ref 3.87–5.11)
RDW: 11.4 % — ABNORMAL LOW (ref 11.5–15.5)
WBC: 8.5 10*3/uL (ref 4.0–10.5)
nRBC: 0 % (ref 0.0–0.2)

## 2018-08-03 LAB — PHENYTOIN LEVEL, FREE AND TOTAL
PHENYTOIN FREE: 1.9 ug/mL (ref 1.0–2.0)
Phenytoin, Total: 17 ug/mL (ref 10.0–20.0)

## 2018-08-03 MED ORDER — AMOXICILLIN-POT CLAVULANATE 875-125 MG PO TABS: 1.0000 | ORAL_TABLET | Freq: Two times a day (BID) | ORAL | 0 refills | Status: AC

## 2018-08-03 MED ORDER — LEVETIRACETAM ER 500 MG PO TB24: 1000.0000 mg | ORAL_TABLET | Freq: Two times a day (BID) | ORAL | 0 refills | Status: DC

## 2018-08-03 NOTE — Discharge Summary (Addendum)
Physician Discharge Summary  Kaitlyn Powell:[id number] DOB: 1956-01-09  PCP: Tracey Harries, MD  Admit date: 08/01/2018 Discharge date: 08/03/2018  Recommendations for Outpatient Follow-up:  1. Dr. Tracey Harries, PCP in 1 week with repeat labs (CBC & BMP).  Please follow Keppra levels that were sent from the hospital. 2. Recommend repeating chest x-ray in 4 weeks to ensure resolution of pneumonia findings. 3. Dr. Levert Feinstein, Neurology in 1 week.  Home Health: None.  Family declined. Equipment/Devices: None.  Discharge Condition: Improved and stable. CODE STATUS: Full Diet recommendation: Heart healthy diet/mechanical soft diet.  Discharge Diagnoses:  Principal Problem:   Aspiration pneumonia (HCC) Active Problems:   Down syndrome   Seizures (HCC)   Acquired hypothyroidism   History of pulmonary embolism   Pacemaker   Dehydration   Toxic metabolic encephalopathy   Brief Summary: Kaitlyn Powell a very pleasant 63 year old female patient, lives with her sister (HCPOA)and brother-in-law, ambulates with the help of their assistance but does not use a cane or a walker, PMH of Down syndrome with mental retardation, seizure disorder, aspiration pneumonia, dysphagia- off of thickener for more than a year and tolerating regular diet, heart block status post pacemaker, GERD, hypothyroid, PE completed anticoagulation, OSA on CPAP with 3 L oxygen, presented to [facility] long ED on 08/01/2018 with complaints of increasing seizures, cough and altered mental status.  She was admitted for right lower lobe aspiration pneumonia, dehydration, seizures and encephalopathy.    Assessment & Plan:   1. Suspected aspiration pneumonia:Likely during episodes of seizures and associated somnolence. Not hypoxic. Treated with IV Unasyn and completed approximately 2 days of same. Aspiration precautions. Mechanical soft diet.   Speech therapy evaluated, discussed with family and recommended continuing  soft diet, thin liquids and no follow-up recommended.  She is tolerating diet without difficult.  Clinically improved.  Discharged on oral Augmentin to complete total 1 week treatment. Recommend repeating chest x-ray in 4 weeks to ensure resolution of pneumonia findings. 2. Dehydration:Secondary to poor oral intake.   Resolved after brief IV fluids.  Eating and drinking quite well this morning. 3. Seizure disorder: Patient was last seen at [facility] in November 2019. Addition of Lamictal for increasing seizures at that time was suggested but family were reluctant. Patient was supposed to see a second neurologist with [facility] on day of admission for a second opinion but was unable to. I discussed with neuro hospitalist in detail on 12/30 who recommended increasing Keppra XRfrom 1 g at bedtime to 1 g twice daily, continue current Dilantin 200 mg twice daily and as needed Ativan 1 mg every 6 hours as needed for seizures >5 minutes.  Dilantin levels within normal limits although on the higher side of range (free phenytoin level: 1.9 and total phenytoin level: 17).  Keppra level pending and can be followed as outpatient.  No further seizures since hospital admission.  Follow-up with neurology in a week from discharge and family plans to return to [facility]. 4. Acute toxic metabolic encephalopathy:Likely related to increasing seizures, pneumonia and dehydration. Treat underlying cause and monitor.   Resolved. 5. Macrocytic anemia:Macrocytosis may be related to Dilantin versus hypothyroidism.   Stable. 6. Thrombocytopenia:Appears intermittent and chronic. No bleeding reported. Slightly better and stable compared to yesterday.  Follow CBCs closely as outpatient.  Slight worsening may have been related to infection and antibiotics. 7. Hypothyroidism:Continue Synthroid. TSH 0.418. 8. Status post pacemaker:Follows with Dr. Johney Frame, EP Cardiology. 9. OSA on nightly  CPAP: Follows  with Dr. Kendrick FriesMcQuaid, [facility]. Continue. 10. Down syndrome with cognitive impairment 11. Acute right maxillary sinusitis: Noted on CT but not very symptomatic.  Above antibiotic should cover.    Consultants:  None  Procedures:  None Discharge Instructions  Discharge Instructions    Call MD for:   Complete by:  As directed    Recurrent seizures.   Call MD for:  difficulty breathing, headache or visual disturbances   Complete by:  As directed    Call MD for:  extreme fatigue   Complete by:  As directed    Call MD for:  persistant dizziness or light-headedness   Complete by:  As directed    Call MD for:  persistant nausea and vomiting   Complete by:  As directed    Call MD for:  severe uncontrolled pain   Complete by:  As directed    Call MD for:  temperature >100.4   Complete by:  As directed    Diet - low sodium heart healthy   Complete by:  As directed    Mechanical soft diet.   Increase activity slowly   Complete by:  As directed        Medication List    STOP taking these medications   fluticasone 50 MCG/ACT nasal spray Commonly known as:  FLONASE     TAKE these medications   amoxicillin-clavulanate 875-125 MG tablet Commonly known as:  AUGMENTIN Take 1 tablet by mouth 2 (two) times daily for 5 days.   citalopram 20 MG tablet Commonly known as:  CELEXA Take 20 mg by mouth daily.   folic acid 1 MG tablet Commonly known as:  FOLVITE Take 1 mg  by mouth  daily   levETIRAcetam 500 MG 24 hr tablet Commonly known as:  KEPPRA XR Take 2 tablets (1,000 mg total) by mouth 2 (two) times daily. What changed:    how much to take  when to take this   levothyroxine 150 MCG tablet Commonly known as:  SYNTHROID, LEVOTHROID Take 150 mcg by mouth daily before breakfast.   meloxicam 15 MG tablet Commonly known as:  MOBIC TAKE ONE TABLET (15 MG DOSE) BY MOUTH DAILY.   OXYGEN Inhale 3 L into the lungs as directed. With CPAP   phenytoin  200 MG ER capsule Commonly known as:  DILANTIN Take 1 capsule (200 mg total) by mouth 2 (two) times daily.   risperiDONE 1 MG tablet Commonly known as:  RISPERDAL Take 1 mg by mouth every morning and 2 mg at night   simvastatin 40 MG tablet Commonly known as:  ZOCOR Take 40 mg  by mouth daily at bedtime   TH VITAMIN B12 100 MCG tablet Generic drug:  cyanocobalamin Take 100 mcg by mouth every morning.      Follow-up Information    Tracey HarriesBouska, David, MD. Schedule an appointment as soon as possible for a visit in 1 week(s).   Specialty:  Family Medicine Why:  To be seen with repeat labs (CBC & BMP).  Recommend repeating chest x-ray in 4 weeks to ensure resolution of pneumonia findings. Contact information: [address] [place] [id number] [phone]        Levert FeinsteinYan, Yijun, MD. Schedule an appointment as soon as possible for a visit in 1 week(s).   Specialty:  Neurology Contact information: [address] [place] [address] [phone]          Allergies  Allergen Reactions  . Cephalexin Other (  See Comments)    Seizures   . Namenda [Memantine Hcl] Other (See Comments)    Made memory issues worse, sleepiness, increased confusion      Procedures/Studies: Dg Chest 2 View  Result Date: 08/01/2018 CLINICAL DATA:  Cough and altered mental status. EXAM: CHEST - 2 VIEW COMPARISON:  02/06/2018 and prior radiograph FINDINGS: Mild cardiomegaly and RIGHT pacemaker again noted. New RIGHT LOWER lung airspace opacity likely represents pneumonia. No pneumothorax or acute bony abnormality. Remote LEFT clavicle and rib fractures again identified. IMPRESSION: New RIGHT LOWER lung airspace opacity compatible with pneumonia. Radiographic follow-up to resolution recommended. Electronically Signed   By: Harmon PierJeffrey  Hu M.D.   On: 08/01/2018 15:38   Ct Head Wo Contrast  Result Date: 08/01/2018 CLINICAL DATA:  63 year old female with chronic headache and altered  mental status. EXAM: CT HEAD WITHOUT CONTRAST TECHNIQUE: Contiguous axial images were obtained from the base of the skull through the vertex without intravenous contrast. COMPARISON:  05/31/2017 and 05/11/2016 CTs, and 02/01/2017 MR FINDINGS: Brain: Ventriculomegaly has slightly increased since 2018. Atrophy and probable chronic small-vessel white matter ischemic changes again noted. There is no evidence of midline shift, hemorrhage, acute infarct, mass or extra-axial collection. Vascular: Carotid atherosclerotic calcifications again noted. Skull: Normal. Negative for fracture or focal lesion. Sinuses/Orbits: Fluid in the RIGHT maxillary sinus again noted. Other: None IMPRESSION: 1. Slightly increased ventriculomegaly, which may be secondary to atrophy and white matter disease but communicating hydrocephalus is not excluded. 2. Atrophy, chronic small-vessel white matter ischemic changes and carotid atherosclerosis. 3. Fluid in the RIGHT maxillary sinus again noted which may represent acute sinusitis. Electronically Signed   By: Harmon PierJeffrey  Hu M.D.   On: 08/01/2018 15:53      Subjective: Patient states she feels "okay".  She is a poor historian due to cognitive impairment from Down syndrome.  Discussed with family (sister and brother-in-law at bedside) who state that she is doing much better, eating and drinking well, no seizure-like activity and feel comfortable for discharge home.  Discharge Exam:  Vitals:   08/02/18 0531 08/02/18 1346 08/02/18 2016 08/03/18 0508  BP: 114/65 (!) 129/56 134/61 (!) 108/59  Pulse: 76 63 66 (!) 53  Resp: 16 18 (!) 21 16  Temp: (!) 97.4 F (36.3 C) 98.1 F (36.7 C) (!) 97.5 F (36.4 C) 98.1 F (36.7 C)  TempSrc: Oral Oral Oral Oral  SpO2: 100% 96% 94% 100%    General exam: Pleasant middle-aged female, small built and moderately nourished sitting up comfortably in bed.  Appears to be in good spirits.  Smiling.  Has Down's facies Respiratory system: Occasional basal  crackles but otherwise clear to auscultation. Respiratory effort normal. Cardiovascular system: S1 & S2 heard, RRR. No JVD, murmurs, rubs, gallops or clicks. No pedal edema.   Telemetry personally reviewed: SR-SB in the 2250s.  Occasional on demand atrial pacing. Gastrointestinal system: Abdomen is nondistended, soft and nontender. No organomegaly or masses felt. Normal bowel sounds heard. Central nervous system: Alert and oriented x1.  Follows simple instructions. No focal neurological deficits. Extremities: Symmetric 5 x 5 power. Skin: No rashes, lesions or ulcers Psychiatry: Judgement and insight impaired. Mood & affect pleasant.     The results of significant diagnostics from this hospitalization (including imaging, microbiology, ancillary and laboratory) are listed below for reference.      Labs: CBC: Recent Labs  Lab 08/01/18 1608 08/02/18 0657 08/03/18 0725  WBC 12.7* 8.7 8.5  NEUTROABS 10.4*  --   --   HGB  11.6* 10.6* 11.5*  HCT 36.0 33.8* 31.8*  MCV 107.5* 111.6* 98.1  PLT 110* 93* 97*   Basic Metabolic Panel: Recent Labs  Lab 08/01/18 1608 08/02/18 0657  NA 135 140  K 4.1 3.8  CL 101 106  CO2 25 27  GLUCOSE 92 105*  BUN 24* 19  CREATININE 0.82 0.69  CALCIUM 8.6* 8.3*   Liver Function Tests: Recent Labs  Lab 08/01/18 1608  AST 19  ALT 15  ALKPHOS 66  BILITOT 0.4  PROT 6.9  ALBUMIN 3.0*   Thyroid function studies Recent Labs    08/02/18 1012  TSH 0.418   Urinalysis    Component Value Date/Time   COLORURINE YELLOW 08/01/2018 1603   APPEARANCEUR CLEAR 08/01/2018 1603   APPEARANCEUR Turbid (A) 06/21/2018 1014   LABSPEC 1.011 08/01/2018 1603   PHURINE 6.0 08/01/2018 1603   GLUCOSEU NEGATIVE 08/01/2018 1603   HGBUR NEGATIVE 08/01/2018 1603   BILIRUBINUR NEGATIVE 08/01/2018 1603   BILIRUBINUR Negative 06/21/2018 1014   KETONESUR NEGATIVE 08/01/2018 1603   PROTEINUR NEGATIVE 08/01/2018 1603   NITRITE NEGATIVE 08/01/2018 1603   LEUKOCYTESUR  NEGATIVE 08/01/2018 1603   LEUKOCYTESUR 3+ (A) 06/21/2018 1014    I discussed in detail with patient's sister, brother and brother-in-law at bedside.  Updated care and answered all questions.  Time coordinating discharge: 40 minutes  SIGNED:  Marcellus Scott, MD, FACP, [facility]. [facility] Pager [phone]  If 7PM-7AM, please contact night-coverage [email or web address] Password [facility] 08/03/2018, 12:13 PM

## 2018-08-03 NOTE — Progress Notes (Signed)
Margarito Courser Kocian to be D/C'd Home per MD order.  Discussed prescriptions and follow up appointments with the patient. Prescriptions given to patient, medication list explained in detail. Pt verbalized understanding.  Allergies as of 08/03/2018      Reactions   Cephalexin Other (See Comments)   Seizures    Namenda [memantine Hcl] Other (See Comments)   Made memory issues worse, sleepiness, increased confusion      Medication List    STOP taking these medications   fluticasone 50 MCG/ACT nasal spray Commonly known as:  FLONASE     TAKE these medications   amoxicillin-clavulanate 875-125 MG tablet Commonly known as:  AUGMENTIN Take 1 tablet by mouth 2 (two) times daily for 5 days.   citalopram 20 MG tablet Commonly known as:  CELEXA Take 20 mg by mouth daily.   folic acid 1 MG tablet Commonly known as:  FOLVITE Take 1 mg  by mouth  daily   levETIRAcetam 500 MG 24 hr tablet Commonly known as:  KEPPRA XR Take 2 tablets (1,000 mg total) by mouth 2 (two) times daily. What changed:    how much to take  when to take this   levothyroxine 150 MCG tablet Commonly known as:  SYNTHROID, LEVOTHROID Take 150 mcg by mouth daily before breakfast.   meloxicam 15 MG tablet Commonly known as:  MOBIC TAKE ONE TABLET (15 MG DOSE) BY MOUTH DAILY.   OXYGEN Inhale 3 L into the lungs as directed. With CPAP   phenytoin 200 MG ER capsule Commonly known as:  DILANTIN Take 1 capsule (200 mg total) by mouth 2 (two) times daily.   risperiDONE 1 MG tablet Commonly known as:  RISPERDAL Take 1 mg by mouth every morning and 2 mg at night   simvastatin 40 MG tablet Commonly known as:  ZOCOR Take 40 mg  by mouth daily at bedtime   TH VITAMIN B12 100 MCG tablet Generic drug:  cyanocobalamin Take 100 mcg by mouth every morning.       Vitals:   08/02/18 2016 08/03/18 0508  BP: 134/61 (!) 108/59  Pulse: 66 (!) 53  Resp: (!) 21 16  Temp: (!) 97.5 F (36.4 C) 98.1 F (36.7 C)  SpO2: 94%  100%    Skin clean, dry and intact without evidence of skin break down, no evidence of skin tears noted. IV catheter discontinued intact. Site without signs and symptoms of complications. Dressing and pressure applied. Pt denies pain at this time. No complaints noted.  An After Visit Summary was printed and given to the patient. Patient escorted via WC, and D/C home via private auto.  Viviana Simpler S 08/03/2018 1:18 PM

## 2018-08-03 NOTE — Progress Notes (Deleted)
Tele notified RN that pt had 2 short runs of A flutter but went back into NSR with pacing. Notified the on-call MD. Will continue to monitor.

## 2018-08-03 NOTE — Discharge Instructions (Signed)

## 2018-08-04 ENCOUNTER — Telehealth: Payer: Self-pay | Admitting: *Deleted

## 2018-08-04 LAB — LEVETIRACETAM LEVEL: LEVETIRACETAM: 17.3 ug/mL (ref 10.0–40.0)

## 2018-08-04 NOTE — Telephone Encounter (Signed)
Spoke to Wil[place], sister.  Pt has had different presentation of her sz since prior to christmas.  Usually drop type sz in BR toilet, last 3-5 sec, but has since having drop sz, ragdoll, non responsive type sz, twitching, not able to walk.  Taken to [facility], CT showed atrophy.  CXR penumonia.  Is on keppra 1000mg  po BID, and dilantin 200mg  po bid.  Now at home, has had 2 seizures since (one kitchen table yesterday drop 3-5 sec then today at 1400 in BR drop 3-5 sec).  Has appt 08-17-18 with Dr. Terrace Arabia.  I explained due to different presentation best to see MD.  She stated that appt with Dr. Karel Jarvis was another opinion, but decided to stay with [place] since we know pt.  If another later time appt come available would be called, Nikki B in referrals.  She appreciated call.

## 2018-08-04 NOTE — Telephone Encounter (Signed)
Pts sister [name] returning RNs call

## 2018-08-04 NOTE — Telephone Encounter (Signed)
Kaitlyn Powell, MRN [id number]? she was seen at the ED 12/30, and it looks like they're wanting her seen w/in a week. she's a pts of dr Catalina Gravel and megan's. her POA called in wanting to sched something.  She was last seen 06-21-18 for seizures.  She was seen in ED for aspiration pneumonia, due to seizures.  They increased her dose to keppra 1000mg  po bid, Dilantin same at 200mg  po bid.  On augmentin for pneumonia.  Keppra level is not available from 12/30/23019.  No appt available in one week time slot.  Please advise.

## 2018-08-04 NOTE — Telephone Encounter (Signed)
LMVM for Kaitlyn Powell, sister of pt that I returned call. Saw that she was hospitalized and needing 1 one wk f/u with us.  I had some questions.  Please return call.

## 2018-08-17 ENCOUNTER — Encounter: Payer: Self-pay | Admitting: Neurology

## 2018-08-17 ENCOUNTER — Encounter

## 2018-08-17 ENCOUNTER — Ambulatory Visit (INDEPENDENT_AMBULATORY_CARE_PROVIDER_SITE_OTHER): Payer: Medicare Other | Admitting: Neurology

## 2018-08-17 VITALS — BP 112/63 | HR 76 | Ht <= 58 in | Wt 161.0 lb

## 2018-08-17 DIAGNOSIS — Q909 Down syndrome, unspecified: Secondary | ICD-10-CM | POA: Diagnosis not present

## 2018-08-17 DIAGNOSIS — R569 Unspecified convulsions: Secondary | ICD-10-CM

## 2018-08-17 NOTE — Progress Notes (Signed)
PATIENT: Kaitlyn Powell  DOB: [birth year]  REASON FOR VISIT: follow up HISTORY FROM: patient  HISTORY OF PRESENT ILLNESS: Today 08/17/18  HISTORY Kaitlyn Powell a 63 years old left-handed female, accompanied by her sister Kaitlyn Powell, who is also her power of attorney, seen in refer by her primary care physician Kaitlyn Powell, to evaluate for seizure, initial evaluation was on January 11 2017.  I reviewed and summarized the referring and most recent hospital discharge from [facility] in September 2017, she had a history of Down syndrome, asthma, obstructive sleep apnea, on CPAP at night, hypothyroidism, seizure, sick sinus syndrome status post pacemaker, she was admitted to the hospital at [facility] on March 12 2016 with dyspnea and wheezing, was noted to have low-grade fever 37.6, oxygen saturation was 82%, chest x-ray showed Ldisease, she was treated with Zosyn and Vancomyocin, CT angiograms demonstrate small feeding defect in left lower lobe pulmonary artery, suspicious for PE, she was treated with IV heparin, was also seen for possible congestive heart failure, required ICU admission, daytime BiPAP, she had prolonged ICU stay in for 16 days, she was able to eventually wean off BiPAP, modified swallowing test confirmed swallowing dysfunction with silent aspiration, she was put on NG tube, later switched to Coumadin due to PE, but INR was difficult to stabilize in the therapeutic window,   Her acute hypoxic respiratory failure was thought due to the combination of pulmonary emboli, obstructive sleep apnea, congestive heart failure exacerbation and pneumonia, she was also treated with a short course of Solu-Medrol, echocardiogram March 13 2017, showed ejection fraction of 60-65%, pulmonary hypertension likely secondary to diastolic heart failure, possible contribution from PE and L space disease,  She was seen by hematologist,  who suggested 3 months course of anticoagulation with Lovenox, she had prolonged rehabilitation since April 2017, regained significant recovery, she also noted to have thrombocytopenia, had PEG tube placement, this was placed on April 17 2016 without complication, postprocedure she had mild to moderate generalized abdominal pain, extensive evaluations, including normal or negative UA, hepatic panel, abdominal x-ray, she was able to tolerate the tube feeding well, next  Laboratory evaluations in September 2017, WBC 5.9, hemoglobin of 9.0, creatinine of 0.88,  She also had a lifelong history of seizure, previously was treated with Dilantin, macrocytic anemia with normal B12 level, considered due to prolonged use of Dilantin, there was a concern of interaction between Dilantin and other long-term by mouth anticoagulation treatment such as Coumadin and Kaitlyn Powell, she was discharged with Lovenox subcutaneous injection, PEG tube was removed in Nov 2017, she is on a mechanical soft diet, thickened liquid, she began to progress to small bite, able to take by mouth's by mouth  Most recent laboratory evaluation in May 2018, hemoglobin 10 point 7, normal CMP, creatinine of 0.96, normal liver functional test, slight elevated d-dimer 1.01  She is currently taking Dilantin 200 mg twice a day, she lives with her sister's family, she has stopped the Lovenox since November 2017,  She suffered seizure all her life, drop seizures, she is now having on daily basis, she had sudden onset of lost muscle tone, loss consciousness, lasting less than 1 minute,   UPDATE February 17 2017: She is accompanied by her sister at today's clinical visit, following last visit on January 11 2017, in attempt to change her antiepileptic medications tapering off the Dilantin, titrating up Keppra, she suffered significant complications,  While taking Keppra 500  mg twice a day, decrease Dilantin to 200 mg every night, she suffered  generalized tonic-clonic seizure, with total of 6 generalized tonic-clonic seizure since, the last one was on January 28 2017, Dilantin 200 mg twice a day was reintroduced, she is now on lower dose of Keppra 500 mg twice a day,  She still drowsy, especially after morning dose of medication, she no longer has recurrent generalized tonic-clonic seizure, only had one drop attack with current combinations,  Before that last generalized seizure was in October 2017, she was also noted to be emotional.   We have personally reviewed MRI of the brain without contrast in July 2018: Congenital microcephaly consistent with her history of Down syndrome, moderate parenchymal brain volume loss, moderate small vessel disease  EEG showed moderate to severe background slowing, rare generalized spike slow waves,  UPDATE March 18 2017: she is now taking Dilantin ER 200 mg 1 tablet twice a day, and keppra 500 mg twice a day  sister Reported drop attacks, sudden loss of muscle tone, lasting for a few second, no body jerking movement, but only happened while she was sitting on the toilet straining, she had 4 spells over past 2 weeks,  Her sister reported that adding Keppra has decreased the frequency of those spells, but I am not sure those could represent vasovagal syncope versus seizures  UPDATE05/14/19: 3-day video EEG in December 2018 showed that she has 6 events logged. On event #3 there was some mild frontal slowing that was bisynchronous. Event #6 was accompanied by generalized frontal predominant 2 Hz spike and wave activity lasting 17 seconds with some after coming slow waves. The patient was not on video for these events.  Patient is now taking Keppra XR 500 mg 5 tablets every night Dilantin 200 mg twice a day  She has no recurrent generalized seizure, but continue have spells of sudden loss of consciousness, went limp, it happened most open while she sits on toilet, lasting for less than  couple minutes, no self injury, but it happened multiple times each week, sometimes couple times each day  She was also noted to have increased confusion, she is also on polypharmacy treatment, citalopram 20 mg every day, Risperdal 1 mg 1/2  UPDATE Dec 14 2017: She is now taking keppra 500mg  2 tabs qhs, Dilantin 200mg  bid,she could not tolerate lamotrigine, even at 25 mg daily,  She still have spells of suddenheaddrop onthetoilet  Update 04/13/18  Kaitlyn Powell is a 63 year old female with a history of seizures and dementia.  She returns today for follow-up.  Her daughter reports that she was doing well but for the last 3 months she began having episodes while she is on the toilet again.  She states that most of the episodes she just goes limp for several seconds.  She states there has been 2 occasions where she had mild convulsing only for seconds and then she was back to her baseline.  She reports that she has been more confused in the morning.  She states over the weekend they increased Keppra XR to 1500 mg daily.  She states that she has been a little more lethargic in the mornings but she contributes this to the increase in medication.  She states that she did not have an event yesterday and prior to the increase in Keppra she was having daily events.  She continues on Dilantin 200 mg twice a day.  Today 08/17/18:  Kaitlyn Powell. is a 63 year old female with a history  of seizures.  She returns today for follow-up.  At the last visit they had increase Keppra XR to 1500 mg daily and they state after  3 weeks she began having daily events.  They reduce her dose back to thousand milligrams.  She seems to tolerate this better in regards to drowsiness.  Her family states that she may go several days with no seizure event and then she will have them daily for several days.  Her seizures continue to occur mainly when she is on the toilet.  They also noted in the last 2 to 3 weeks she has been more  confused.  This is a sudden change.  They state that she has to be prompted to do everything.  Whereas before is if it was  in front of her she knew what to do.  She returns today for an evaluation.  UPDATE Jan 15th 2020: She was admitted to the hospital on August 01, 2018, sister described patient was confused when she woke up from overnight sleep, had repeat episode of sudden body jerking movement, lasting for 3 to 4 hours, could not ambulate, She was diagnosed with aspiration pneumonia, increased seizure activity, was treated with IV antibiotic, was seen by neuro hospitalist, increasing Keppra from 1 g daily to twice a day, continue current Dilantin 200 mg twice a day, as needed Ativan,  I personally reviewed CT head without contrast on August 01, 2018, slightly increased ventriculomegaly, secondary to atrophy, white matter disease,  Laboratory evaluations in January 2020: Normal TSH, CBC showed hemoglobin of 11.5, platelet was decreased 97, BMP showed mild elevated glucose 105, negative HIV, Keppra level was 17.3, Dilantin level was 1.9  Sister would like her to have second opinion with epileptiologist Dr. Karel [facility]visAquino at [facility]   REVIEW OF SYSTEMS: Out of a complete 14 system review of symptoms, the patient complains only of the following symptoms, and all other reviewed systems are negative.  Shortness of breath, wheezing, incontinence, joint swelling, confusion, seizure  ALLERGIES: Allergies  Allergen Reactions  . Cephalexin Other (See Comments)    Seizures   . Namenda [Memantine Hcl] Other (See Comments)    Made memory issues worse, sleepiness, increased confusion    HOME MEDICATIONS: Outpatient Medications Prior to Visit  Medication Sig Dispense Refill  . citalopram (CELEXA) 20 MG tablet Take 20 mg by mouth daily.    . cyanocobalamin (TH VITAMIN B12) 100 MCG tablet Take 100 mcg by mouth every morning.     . folic acid (FOLVITE) 1 MG tablet Take 1 mg  by mouth   daily    . levETIRAcetam (KEPPRA XR) 500 MG 24 hr tablet Take 2 tablets (1,000 mg total) by mouth 2 (two) times daily. 120 tablet 0  . levothyroxine (SYNTHROID, LEVOTHROID) 150 MCG tablet Take 150 mcg by mouth daily before breakfast.    . OXYGEN Inhale 3 L into the lungs as directed. With CPAP     . phenytoin (DILANTIN) 200 MG ER capsule Take 1 capsule (200 mg total) by mouth 2 (two) times daily. 60 capsule 5  . risperiDONE (RISPERDAL) 1 MG tablet Take 1 mg by mouth every morning and 2 mg at night    . simvastatin (ZOCOR) 40 MG tablet Take 40 mg  by mouth daily at bedtime 90 tablet 2  . meloxicam (MOBIC) 15 MG tablet TAKE ONE TABLET (15 MG DOSE) BY MOUTH DAILY.  2   No facility-administered medications prior to visit.     PAST  MEDICAL HISTORY: Past Medical History:  Diagnosis Date  . Arthritis   . Aspiration pneumonia (HCC)   . Asthma   . Cardiac arrhythmia    have pacemeaker  . Down syndrome   . GERD (gastroesophageal reflux disease)   . Heart block   . Hypothyroidism   . Mental retardation   . Pacemaker   . Pulmonary emboli (HCC)   . Seizures (HCC)   . Sleep apnea with use of continuous positive airway pressure (CPAP)     PAST SURGICAL HISTORY: Past Surgical History:  Procedure Laterality Date  . PACEMAKER INSERTION  12/09/2015   [employer] Accolade MRI EL PPM implanted in PA for complete heart block and syncope  . PEG TUBE PLACEMENT     removed in 2017  . TIBIA FRACTURE SURGERY Right     FAMILY HISTORY: Family History  Problem Relation Age of Onset  . Diabetes Father   . Heart disease Father   . Breast cancer Sister     SOCIAL HISTORY: Social History   Socioeconomic History  . Marital status: Single    Spouse name: Not on file  . Number of children: Not on file  . Years of education: Not on file  . Highest education level: Not on file  Occupational History  . Occupation: disabled  Social Needs  . Financial resource strain: Not on file  . Food  insecurity:    Worry: Not on file    Inability: Not on file  . Transportation needs:    Medical: Not on file    Non-medical: Not on file  Tobacco Use  . Smoking status: Never Smoker  . Smokeless tobacco: Never Used  Substance and Sexual Activity  . Alcohol use: No  . Drug use: No  . Sexual activity: Not on file  Lifestyle  . Physical activity:    Days per week: Not on file    Minutes per session: Not on file  . Stress: Not on file  Relationships  . Social connections:    Talks on phone: Not on file    Gets together: Not on file    Attends religious service: Not on file    Active member of club or organization: Not on file    Attends meetings of clubs or organizations: Not on file    Relationship status: Not on file  . Intimate partner violence:    Fear of current or ex partner: Not on file    Emotionally abused: Not on file    Physically abused: Not on file    Forced sexual activity: Not on file  Other Topics Concern  . Not on file  Social History Narrative   LIves in group home       PHYSICAL EXAM  Vitals:   08/17/18 0822  BP: 112/63  Pulse: 76  Weight: 161 lb (73 kg)  Height: 4\' 6"  (1.372 m)   Body mass index is 38.82 kg/m.  Generalized: Well developed, in no acute distress   Neurological examination  Mentation: Alert. Follows all commands intermittently speech is limited cranial nerve II-XII. Extraocular movements were full. Facial sensation and strength were normal. Motor: The motor testing reveals 5 over 5 strength of all 4 extremities. Good symmetric motor tone is noted throughout.  Sensory: Sensory testing is intact to soft touch on all 4 extremities. No evidence of extinction is noted.  Coordination: unable to test Gait and station: Gait is slightly unsteady.   DIAGNOSTIC DATA (LABS, IMAGING, TESTING) - I  reviewed patient records, labs, notes, testing and imaging myself where available.  Lab Results  Component Value Date   WBC 8.5 08/03/2018     HGB 11.5 (L) 08/03/2018   HCT 31.8 (L) 08/03/2018   MCV 98.1 08/03/2018   PLT 97 (L) 08/03/2018      Component Value Date/Time   NA 140 08/02/2018 0657   NA 132 (L) 06/21/2018 0935   K 3.8 08/02/2018 0657   CL 106 08/02/2018 0657   CO2 27 08/02/2018 0657   GLUCOSE 105 (H) 08/02/2018 0657   BUN 19 08/02/2018 0657   BUN 18 06/21/2018 0935   CREATININE 0.69 08/02/2018 0657   CALCIUM 8.3 (L) 08/02/2018 0657   PROT 6.9 08/01/2018 1608   PROT 7.6 06/21/2018 0935   ALBUMIN 3.0 (L) 08/01/2018 1608   ALBUMIN 3.7 06/21/2018 0935   AST 19 08/01/2018 1608   ALT 15 08/01/2018 1608   ALKPHOS 66 08/01/2018 1608   BILITOT 0.4 08/01/2018 1608   BILITOT 0.3 06/21/2018 0935   GFRNONAA >60 08/02/2018 0657   GFRAA >60 08/02/2018 0657   Lab Results  Component Value Date   CHOL 209 (H) 06/15/2018   HDL 106 06/15/2018   LDLCALC 91 06/15/2018   TRIG 60 06/15/2018   CHOLHDL 2.0 06/15/2018   No results found for: HGBA1C No results found for: VITAMINB12 Lab Results  Component Value Date   TSH 0.418 08/02/2018    ASSESSMENT AND PLAN 63 y.o. year old female   Down syndrome Early onset dementia Increased seizure activity,  Likely status epilepticus on August 01, 2018  On higher dose of anti-elliptic medications now Keppra extended release 1000 mg twice a day, Dilantin 100 mg 2 tablets twice a day,  Her sister is very reluctant to make any changes in her medication at this point, would like second opinion at [facility]  Face to face time was 25  minutes, greater than 50% of the time was spent in counseling and coordination of care with the patient.    Levert Feinstein, M.D. Ph.D.  [facility] [address] [place], [place] [address] Phone: [phone] Fax:      [phone]

## 2018-08-17 NOTE — Patient Instructions (Signed)
Status epilepticus

## 2018-08-25 ENCOUNTER — Ambulatory Visit (INDEPENDENT_AMBULATORY_CARE_PROVIDER_SITE_OTHER): Payer: Medicare Other | Admitting: Podiatry

## 2018-08-25 ENCOUNTER — Encounter: Payer: Self-pay | Admitting: Podiatry

## 2018-08-25 VITALS — BP 131/68 | HR 76 | Resp 16

## 2018-08-25 DIAGNOSIS — M79674 Pain in right toe(s): Secondary | ICD-10-CM | POA: Diagnosis not present

## 2018-08-25 DIAGNOSIS — B351 Tinea unguium: Secondary | ICD-10-CM | POA: Diagnosis not present

## 2018-08-25 DIAGNOSIS — M79675 Pain in left toe(s): Secondary | ICD-10-CM | POA: Diagnosis not present

## 2018-08-25 NOTE — Progress Notes (Signed)
Subjective: Kaitlyn Powell presents today with painful, thick toenails 1-5 b/l that she cannot cut and which interfere with daily activities.  Pain is aggravated when wearing enclosed shoe gear.  Her family is present with her during the visit.  Her family would like to discuss topical antifungal treatment for her mycotic toenails today.  Tracey Harries, MD is her PCP.   Current Outpatient Medications:  .  citalopram (CELEXA) 20 MG tablet, Take 20 mg by mouth daily., Disp: , Rfl:  .  cyanocobalamin (TH VITAMIN B12) 100 MCG tablet, Take 100 mcg by mouth every morning. , Disp: , Rfl:  .  folic acid (FOLVITE) 1 MG tablet, Take 1 mg  by mouth  daily, Disp: , Rfl:  .  levETIRAcetam (KEPPRA XR) 500 MG 24 hr tablet, Take 2 tablets (1,000 mg total) by mouth 2 (two) times daily., Disp: 120 tablet, Rfl: 0 .  levothyroxine (SYNTHROID, LEVOTHROID) 150 MCG tablet, Take 150 mcg by mouth daily before breakfast., Disp: , Rfl:  .  OXYGEN, Inhale 3 L into the lungs as directed. With CPAP , Disp: , Rfl:  .  phenytoin (DILANTIN) 200 MG ER capsule, Take 1 capsule (200 mg total) by mouth 2 (two) times daily., Disp: 60 capsule, Rfl: 5 .  risperiDONE (RISPERDAL) 1 MG tablet, Take 1 mg by mouth every morning and 2 mg at night, Disp: , Rfl:  .  simvastatin (ZOCOR) 40 MG tablet, Take 40 mg  by mouth daily at bedtime, Disp: 90 tablet, Rfl: 2  Allergies  Allergen Reactions  . Cephalexin Other (See Comments)    Seizures   . Namenda [Memantine Hcl] Other (See Comments)    Made memory issues worse, sleepiness, increased confusion    Objective:  Neurovascular status unchanged.  Dermatological Examination: Skin with normal turgor, texture and tone b/l  Toenails 1-5 b/l discolored, thick, dystrophic with subungual debris and pain with palpation to nailbeds due to thickness of nails.  Musculoskeletal: Muscle strength 5/5 to all LE muscle groups  No pain, crepitus or joint limitation noted with ROM.    Assessment: Painful onychomycosis toenails 1-5 b/l   Plan: 1. Discussed treatment for onychomycosis.  Patient has had prior terbinafine therapy which is failed.  I did discuss with the family that the topicals are mostly unsuccessful with the degree of onychomycosis that she has.  We did discuss compounding antifungal medication.  Patient's family was given literature and she will discuss with her other family members before they make a decision. 2. Toenails 1-5 b/l were debrided in length and girth without iatrogenic bleeding. 3. Patient to continue soft, supportive shoe gear 4. Patient to report any pedal injuries to medical professional immediately. 5. Follow up 3 months. Patient/POA to call should there be a concern in the interim.

## 2018-08-28 LAB — CUP PACEART REMOTE DEVICE CHECK
Battery Remaining Longevity: 168 mo
Battery Remaining Percentage: 100 %
Brady Statistic RA Percent Paced: 15 %
Brady Statistic RV Percent Paced: 1 %
Date Time Interrogation Session: 20191216200600
Implantable Lead Implant Date: 20170508
Implantable Lead Implant Date: 20170508
Implantable Lead Location: 753859
Implantable Lead Location: 753860
Implantable Lead Model: 7740
Implantable Lead Model: 7741
Implantable Lead Serial Number: 1111
Implantable Pulse Generator Implant Date: 20170508
Lead Channel Impedance Value: 624 Ohm
Lead Channel Impedance Value: 671 Ohm
Lead Channel Pacing Threshold Amplitude: 0.6 V
Lead Channel Pacing Threshold Pulse Width: 0.4 ms
Lead Channel Setting Pacing Amplitude: 2 V
Lead Channel Setting Pacing Amplitude: 5 V
Lead Channel Setting Pacing Pulse Width: 1 ms
MDC IDC LEAD SERIAL: 1111
MDC IDC SET LEADCHNL RV SENSING SENSITIVITY: 2.5 mV
Pulse Gen Serial Number: 747619

## 2018-09-02 ENCOUNTER — Ambulatory Visit (INDEPENDENT_AMBULATORY_CARE_PROVIDER_SITE_OTHER): Payer: Medicare Other | Admitting: Neurology

## 2018-09-02 ENCOUNTER — Encounter: Payer: Self-pay | Admitting: Neurology

## 2018-09-02 VITALS — BP 132/74 | HR 65 | Ht <= 58 in | Wt 151.0 lb

## 2018-09-02 DIAGNOSIS — Q909 Down syndrome, unspecified: Secondary | ICD-10-CM | POA: Diagnosis not present

## 2018-09-02 DIAGNOSIS — G40211 Localization-related (focal) (partial) symptomatic epilepsy and epileptic syndromes with complex partial seizures, intractable, with status epilepticus: Secondary | ICD-10-CM

## 2018-09-02 NOTE — Progress Notes (Signed)
NEUROLOGY CONSULTATION NOTE  KEYSHAWNA PROUSE MRN: [id number] DOB: [birth year]  Referring provider: Dr. Tracey Harries Primary care provider: Dr. Tracey Harries  Reason for consult:  Second opinion regarding seizures  Dear Dr Everlene Other:  Thank you for your kind referral of Kaitlyn Powell for consultation of the above symptoms. Although her history is well known to you, please allow me to reiterate it for the purpose of our medical record. The patient was accompanied to the clinic by her sister and POA Kaitlyn Powell and her brother-in-law who also provide collateral information. Records and images were personally reviewed where available.  HISTORY OF PRESENT ILLNESS: This is a pleasant 63 year old left-handed woman with a history of Down syndrome, sleep apnea, hypothyroidism, sick sinus syndrome s/p PPM, presenting for second opinion regarding seizures. Kaitlyn Powell reports that Kaitlyn Powell's seizures started in her teens/early 63s, they would be walking and all of a sudden she would drop down. She would be awake and appear to lose all muscle tone, family would pick her up and she would be fine. She had been taking Dilantin for many years. Kaitlyn Powell reports a period of time where she was not having seizures, or at least they were not hearing any news from her group home. In 2017, she became quite ill and was treated in [place] for pneumonia, CHF, and pulmonary embolism. She was on Lovenox until November 2017. There was concern for macrocytic anemia with normal B12 level possibly due to prolonged Dilantin use, she had been on Dilantin 200mg  BID. She had an infected PEG tube and had a GTC lasting 20 minutes. She was seen by neurologist Dr. Terrace Arabia in 2018 due to drop seizures occurring on a daily basis with loss of consciousness lasting less than a minute. She was started on Levetiracetam, however with attempt to wean down Dilantin, she had several GTCs. She was restarted on Dilantin 200mg  BID and Keppra dose was reduced. She had an  MRI brain without contrast in July 2018 which showed congenital microcephaly, moderate diffuse volum loss, moderate chronic microvascular disease. Routine EEG reported moderate to severe background slowing with rare generalized spike slow waves. The daily episodes decreased in frequency and would only occur when she was on the toilet. Initially they were occurring when she was having a BM and family could visibly see her abdomen straining, however recently they have occurred while peeing and without any visible straining. She would be out for 10-15 seconds, then wake up looking like she is trying to grab something or moving her hands L>R like she is scratching her leg. She would be talking gibberish or incoherent like baby talk when she starts coming around. A few times she would black out and go backward like someone pushed her back hard. She had a 3-day ambulatory video EEG study in November 2018. Background was diffusely slow with sharp and spike and wave discharges in the left frontotemporal region, bihemispheric, bisynchronous spike and wave discharges, isolated bursts of generalized spike and wave discharges, independent discharges in the right frontotemporal region. In general, there was a bifrontal predominance of discharges including the presence of occasional 2-2.5 slow spike and wave discharges. There was decreased amplitude over the right temporal area. There were 6 push button events, all due to "drop seizure on toilet." With 5 events, there was note of diffuse slow background with several bursts of bilateral rhythmic delta activity/intermittent delta activity. It was noted that event #6 had generalized frontally predominant 2 Hz spike and wave activity lasting  17 seconds with some after coming slow waves. Keppra dose was increased to 2500mg  daily at one point, then dose was reduced to 1000mg  qhs. She was tried on Lamotrigine but could not tolerate 25mg  dose, "looked like she was institutionalized." On  08/01/18, Kaitlyn DandyMary could not wake her up in the morning, she would open her eyes but was unresponsive. [name] sat her up and she started jerking for 10 minutes. They tried to stand her up after but they were dragging her like she was drunk and could not move her legs. She started waking up and family gave her medications, went back to sleep, then again had difficulty waking her up followed by upper body jerking when she was sat up. She was found to have a pneumonia. Keppra dose increased to 1000mg  BID in addition to Dilantin 200mg  BID. Family reports some days she would have 3 in one day, other times she would go 48 hours without an event. Last episode was yesterday. She is also on Risperdal 1mg  BID, family is unsure when or why this was prescribed, she is always happy and always positive.   Prior AEDs: Lamotrigine Laboratory Data:  Lab Results  Component Value Date   PHENYTOIN 17.0 08/01/2018    PAST MEDICAL HISTORY: Past Medical History:  Diagnosis Date  . Arthritis   . Aspiration pneumonia (HCC)   . Asthma   . Cardiac arrhythmia    have pacemeaker  . Down syndrome   . GERD (gastroesophageal reflux disease)   . Heart block   . Hypothyroidism   . Mental retardation   . Pacemaker   . Pulmonary emboli (HCC)   . Seizures (HCC)   . Sleep apnea with use of continuous positive airway pressure (CPAP)     PAST SURGICAL HISTORY: Past Surgical History:  Procedure Laterality Date  . PACEMAKER INSERTION  12/09/2015   [employer] Accolade MRI EL PPM implanted in [place] for complete heart block and syncope  . PEG TUBE PLACEMENT     removed in 2017  . TIBIA FRACTURE SURGERY Right     MEDICATIONS: Current Outpatient Medications on File Prior to Visit  Medication Sig Dispense Refill  . citalopram (CELEXA) 20 MG tablet Take 20 mg by mouth daily.    . cyanocobalamin (TH VITAMIN B12) 100 MCG tablet Take 100 mcg by mouth every morning.     . folic acid (FOLVITE) 1 MG tablet Take 1 mg  by mouth   daily    . levETIRAcetam (KEPPRA XR) 500 MG 24 hr tablet Take 2 tablets (1,000 mg total) by mouth 2 (two) times daily. 120 tablet 0  . levothyroxine (SYNTHROID, LEVOTHROID) 150 MCG tablet Take 150 mcg by mouth daily before breakfast.    . OXYGEN Inhale 3 L into the lungs as directed. With CPAP     . phenytoin (DILANTIN) 200 MG ER capsule Take 1 capsule (200 mg total) by mouth 2 (two) times daily. 60 capsule 5  . risperiDONE (RISPERDAL) 1 MG tablet Take 1 mg by mouth every morning and 2 mg at night    . simvastatin (ZOCOR) 40 MG tablet Take 40 mg  by mouth daily at bedtime 90 tablet 2   No current facility-administered medications on file prior to visit.     ALLERGIES: Allergies  Allergen Reactions  . Cephalexin Other (See Comments)    Seizures   . Namenda [Memantine Hcl] Other (See Comments)    Made memory issues worse, sleepiness, increased confusion    FAMILY HISTORY:  Family History  Problem Relation Age of Onset  . Diabetes Father   . Heart disease Father   . Breast cancer Sister     SOCIAL HISTORY: Social History   Socioeconomic History  . Marital status: Single    Spouse name: Not on file  . Number of children: Not on file  . Years of education: Not on file  . Highest education level: Not on file  Occupational History  . Occupation: disabled  Social Needs  . Financial resource strain: Not on file  . Food insecurity:    Worry: Not on file    Inability: Not on file  . Transportation needs:    Medical: Not on file    Non-medical: Not on file  Tobacco Use  . Smoking status: Never Smoker  . Smokeless tobacco: Never Used  Substance and Sexual Activity  . Alcohol use: No  . Drug use: No  . Sexual activity: Not on file  Lifestyle  . Physical activity:    Days per week: Not on file    Minutes per session: Not on file  . Stress: Not on file  Relationships  . Social connections:    Talks on phone: Not on file    Gets together: Not on file    Attends religious  service: Not on file    Active member of club or organization: Not on file    Attends meetings of clubs or organizations: Not on file    Relationship status: Not on file  . Intimate partner violence:    Fear of current or ex partner: Not on file    Emotionally abused: Not on file    Physically abused: Not on file    Forced sexual activity: Not on file  Other Topics Concern  . Not on file  Social History Narrative   LIves in group home     REVIEW OF SYSTEMS unable to obtain due to cognitive status  PHYSICAL EXAM: Vitals:   09/02/18 0911  BP: 132/74  Pulse: 65  SpO2: 92%   General: No acute distress, Down syndrome facies. Drowsy during the visit, easily arousable Head:  Normocephalic/atraumatic Eyes: Fundoscopic exam shows bilateral sharp discs, no vessel changes, exudates, or hemorrhages Neck: supple, no paraspinal tenderness, full range of motion Back: No paraspinal tenderness Heart: regular rate and rhythm Lungs: Clear to auscultation bilaterally. Vascular: No carotid bruits. Skin/Extremities: No rash, no edema Neurological Exam: Mental status: alert and oriented to person, mild dysarthria. Fund of knowledge is reduced.  Recent and remote memory are impaired.  Attention and concentration are reduced.    Able to name objects and repeat phrases. Cranial nerves: CN I: not tested CN II: pupils equal, round and reactive to light, visual fields intact, fundi unremarkable. CN III, IV, VI:  full range of motion, no nystagmus, no ptosis CN V: facial sensation intact CN VII: upper and lower face symmetric CN VIII: hearing intact to finger rub CN IX, X: gag intact, uvula midline CN XI: sternocleidomastoid and trapezius muscles intact CN XII: tongue midline Bulk & Tone: normal, no fasciculations. Motor: 5/5 throughout with no pronator drift. Sensation: intact to light touch, cold, pin, vibration and joint position sense.  No extinction to double simultaneous stimulation.  Romberg  test negative Deep Tendon Reflexes: +2 throughout, no ankle clonus Plantar responses: downgoing bilaterally Cerebellar: no incoordination on finger to nose testing Gait: wide-based, slow and cautious, no ataxia Tremor: none  IMPRESSION: This is a pleasant 63 year old left-handed woman  with a history of Down syndrome, sleep apnea, hypothyroidism, sick sinus syndrome s/p PPM, presenting for second opinion regarding seizures. Her 72-hour EEG was abnormal with multifocal epileptiform discharges (independent bilateral temporal, generalized slow spike and wave), MRI brain no acute changes with congenital microcephaly. She continues to have recurrent episodes concerning for drop attacks that only occur while sitting on the toilet. Historically these symptoms occur with vasovagal syncope, however an episode captured during her 72-hr EEG had shown generalized frontally predominant 2 Hz spike and wave activity lasting 17 seconds with some after coming slow waves. We discussed adding on another AED and monitoring response. She was given samples for Vimpat 50mg : take 1/2 tablet every night for a week, then increase to 1 tab qhs for a week, then 1 tab BID. Family will call us for an update. Continue Keppra 1000mg  BID and Dilantin 200mg  BID for now. She does not drive. Follow-up in 3 months, family knows to call for any changes.   Thank you for allowing me to participate in the care of this patient. Please do not hesitate to call for any questions or concerns.   Patrcia DollyKaren Aithana Kushner, M.D.  CC: Dr. Everlene OtherBouska

## 2018-09-02 NOTE — Patient Instructions (Signed)
1. Let's try Vimpat 50mg : Take 1/2 tablet every night for a week, then increase to 1 tablet every night for 1 week, then increase to 1 tablet twice a day and update [place]  2. Continue Keppra 1000mg  twice a day and Dilantin 200mg  twice a day  3. Follow-up in 3 months, call for any changes  Seizure Precautions: 1. If medication has been prescribed for you to prevent seizures, take it exactly as directed.  Do not stop taking the medicine without talking to your doctor first, even if you have not had a seizure in a long time.   2. Avoid activities in which a seizure would cause danger to yourself or to others.  Don't operate dangerous machinery, swim alone, or climb in high or dangerous places, such as on ladders, roofs, or girders.  Do not drive unless your doctor says you may.  3. If you have any warning that you may have a seizure, lay down in a safe place where you can't hurt yourself.    4.  No driving for 6 months from last seizure, as per [facility].   Please refer to the following link on the Epile[employer] of [employer]'s website for more information: http://www.epilepsyfoundation.org/answerplace/Social/driving/drivingu.cfm   5.  Maintain good sleep hygiene.   6.  Contact your doctor if you have any problems that may be related to the medicine you are taking.  7.  Call 911 and bring the patient back to the ED if:        A.  The seizure lasts longer than 5 minutes.       B.  The patient doesn't awaken shortly after the seizure  C.  The patient has new problems such as difficulty seeing, speaking or moving  D.  The patient was injured during the seizure  E.  The patient has a temperature over 102 F (39C)  F.  The patient vomited and now is having trouble breathing

## 2018-09-06 ENCOUNTER — Other Ambulatory Visit: Payer: Self-pay | Admitting: Gastroenterology

## 2018-09-07 ENCOUNTER — Other Ambulatory Visit: Payer: Self-pay | Admitting: *Deleted

## 2018-09-07 ENCOUNTER — Ambulatory Visit (INDEPENDENT_AMBULATORY_CARE_PROVIDER_SITE_OTHER): Payer: Medicare Other | Admitting: Pulmonary Disease

## 2018-09-07 ENCOUNTER — Encounter: Payer: Self-pay | Admitting: Neurology

## 2018-09-07 ENCOUNTER — Encounter: Payer: Self-pay | Admitting: Pulmonary Disease

## 2018-09-07 ENCOUNTER — Telehealth: Payer: Self-pay | Admitting: Pulmonary Disease

## 2018-09-07 ENCOUNTER — Ambulatory Visit (INDEPENDENT_AMBULATORY_CARE_PROVIDER_SITE_OTHER)
Admission: RE | Admit: 2018-09-07 | Discharge: 2018-09-07 | Disposition: A | Discharge status: Home / Self Care | Payer: Medicare Other | Source: Ambulatory Visit | Attending: Pulmonary Disease | Admitting: Pulmonary Disease

## 2018-09-07 VITALS — BP 118/64 | HR 61 | Ht <= 58 in | Wt 158.0 lb

## 2018-09-07 DIAGNOSIS — M7989 Other specified soft tissue disorders: Secondary | ICD-10-CM

## 2018-09-07 DIAGNOSIS — G40211 Localization-related (focal) (partial) symptomatic epilepsy and epileptic syndromes with complex partial seizures, intractable, with status epilepticus: Secondary | ICD-10-CM | POA: Insufficient documentation

## 2018-09-07 DIAGNOSIS — R7989 Other specified abnormal findings of blood chemistry: Secondary | ICD-10-CM

## 2018-09-07 DIAGNOSIS — J690 Pneumonitis due to inhalation of food and vomit: Secondary | ICD-10-CM

## 2018-09-07 LAB — D-DIMER, QUANTITATIVE: D-Dimer, Quant: 1.11 mcg/mL FEU — ABNORMAL HIGH (ref <0.50)

## 2018-09-07 MED ORDER — LEVETIRACETAM ER 500 MG PO TB24: 1000.0000 mg | ORAL_TABLET | Freq: Two times a day (BID) | ORAL | 3 refills | Status: DC

## 2018-09-07 NOTE — Patient Instructions (Signed)
Recent aspiration pneumonia: We will repeat a chest x-ray Continue to follow recommendations from speech therapy to avoid aspiration  Chronic respiratory failure with hypercarbia: Continue BiPAP at night  Seizure disorder: Continue Keppra, Vimpat as directed by the neurology team  New leg swelling in the left leg with history of DVT in the past: We will check a blood test called a d-dimer, if that test is negative than you have nothing to worry about.  However, if the test is positive then we will need to order a lower extremity Doppler ultrasound of the left leg to look for a DVT If she does have a DVT (hopefully not) then we will need to work with her pharmacist Thayer Ohmhris at [facility] to sort out what type of blood thinner medicine she could safely take  We will see you back in 3 months or sooner if needed

## 2018-09-07 NOTE — Telephone Encounter (Signed)
Patient's sister Kaitlyn Powell is returning phone call.  Kaitlyn Powell phone number is [phone].

## 2018-09-07 NOTE — Progress Notes (Signed)
Subjective:    Patient ID: Kaitlyn Powell, female    DOB: [birth year], 63 y.o.   MRN: [id number]  Synopsis: Referred in July 2018 for evaluation of asthma and pulmonary embolism. The pulmonary embolism was diagnosed in 2017.  That is her only PE and it was provoked by being sedentary in a wheelchair.  She has a significant history of aspiration pneumonia.  Has a past medical history significant for Down syndrome. Had a PEG tube which was removed in November 2017.  She has used BIPAP with oxygen since 2008.   HPI Chief Complaint  Patient presents with  . Follow-up    pt recently hospitalized for aspiration pna.  will need cxr today.     Kaitlyn Powell was hospitalized recently in the setting of increased lethargy and seizure activity.  She had aspiration pneumonia.  Her seizure doses were adjusted during that hospitalization. She just started Vimpat.  They feel like she is doing well right now.  She has had less seizures and her drowsiness has improved, she is now working with physical therapy.  Her sister says that she has had more leg swelling in th eleft leg since hospital discharge.    Past Medical History:  Diagnosis Date  . Arthritis   . Aspiration pneumonia (HCC)   . Asthma   . Cardiac arrhythmia    have pacemeaker  . Down syndrome   . GERD (gastroesophageal reflux disease)   . Heart block   . Hypothyroidism   . Mental retardation   . Pacemaker   . Pulmonary emboli (HCC)   . Seizures (HCC)   . Sleep apnea with use of continuous positive airway pressure (CPAP)         Review of Systems  Constitutional: Negative for fever and unexpected weight change.  HENT: Negative for nosebleeds, postnasal drip, rhinorrhea, sinus pressure, sneezing, sore throat and trouble swallowing.   Respiratory: Positive for cough and shortness of breath. Negative for chest tightness and wheezing.   Cardiovascular: Negative for palpitations and leg swelling.       Objective:   Physical Exam Vitals:     09/07/18 1000  BP: 118/64  Pulse: 61  SpO2: 99%  Weight: 158 lb (71.7 kg)  Height: 4\' 6"  (1.372 m)    Gen: chronically ill appearing HENT: OP clear, thick neck, neck supple PULM: Crackles RLL, otherwise clear, normal percussion CV: RRR, no mgr, trace edema GI: BS+, soft, nontender Derm: no cyanosis or rash Psyche: normal mood and affect     CBC    Component Value Date/Time   WBC 8.5 08/03/2018 0725   RBC 3.24 (L) 08/03/2018 0725   HGB 11.5 (L) 08/03/2018 0725   HGB 12.1 06/21/2018 0935   HCT 31.8 (L) 08/03/2018 0725   HCT 36.0 06/21/2018 0935   PLT 97 (L) 08/03/2018 0725   PLT 122 (L) 06/21/2018 0935   MCV 98.1 08/03/2018 0725   MCV 104 (H) 06/21/2018 0935   MCH 35.5 (H) 08/03/2018 0725   MCHC 36.2 (H) 08/03/2018 0725   RDW 11.4 (L) 08/03/2018 0725   RDW 12.2 (L) 06/21/2018 0935   LYMPHSABS 1.1 08/01/2018 1608   LYMPHSABS 1.2 06/21/2018 0935   MONOABS 1.0 08/01/2018 1608   EOSABS 0.1 08/01/2018 1608   EOSABS 0.2 06/21/2018 0935   BASOSABS 0.0 08/01/2018 1608   BASOSABS 0.1 06/21/2018 0935    Records from her January 2020 hospitalization and discharge summary reviewed: She had aspiration pneumonia which occurred around the time of a  seizure.  She was treated with IV Unasyn for 2 days.  She was treated with aspiration precautions and a mechanical soft diet.       Assessment & Plan:  Leg swelling - Plan: D-Dimer, Quantitative, D-Dimer, Quantitative, D-Dimer, Quantitative, CANCELED: D-Dimer, Quantitative  Aspiration pneumonia, unspecified aspiration pneumonia type, unspecified laterality, unspecified part of lung (HCC) - Plan: DG Chest 2 View  Discussion: Unfortunately Kaitlyn Powell was hospitalized for aspiration pneumonia again and there was some concern for repeat seizures.  She has since been seen by neurology and they have made adjustments to her seizure medicines and her sister says she is doing much better.  I personally reviewed the images from the CXR and it  showed right lower lobe infiltrate consistent with pneumonia.  I am a bit concerned about the new finding of left leg swelling after this hospitalization as she has a history of DVT.  Recent aspiration pneumonia: We will repeat a chest x-ray Continue to follow recommendations from speech therapy to avoid aspiration  Chronic respiratory failure with hypercarbia: Continue BiPAP at night  Seizure disorder: Continue Keppra, Vimpat as directed by the neurology team  New leg swelling in the left leg with history of DVT in the past: We will check a blood test called a d-dimer, if that test is negative than you have nothing to worry about.  However, if the test is positive then we will need to order a lower extremity Doppler ultrasound of the left leg to look for a DVT If she does have a DVT (hopefully not) then we will need to work with her pharmacist Thayer Ohm at [facility] to sort out what type of blood thinner medicine she could safely take  We will see you back in 3 months or sooner if needed   Current Outpatient Medications:  .  citalopram (CELEXA) 20 MG tablet, Take 20 mg by mouth daily., Disp: , Rfl:  .  cyanocobalamin (TH VITAMIN B12) 100 MCG tablet, Take 100 mcg by mouth every morning. , Disp: , Rfl:  .  folic acid (FOLVITE) 1 MG tablet, Take 1 mg  by mouth  daily, Disp: , Rfl:  .  levETIRAcetam (KEPPRA XR) 500 MG 24 hr tablet, Take 2 tablets (1,000 mg total) by mouth 2 (two) times daily., Disp: 120 tablet, Rfl: 0 .  levothyroxine (SYNTHROID, LEVOTHROID) 150 MCG tablet, Take 150 mcg by mouth daily before breakfast., Disp: , Rfl:  .  OXYGEN, Inhale 3 L into the lungs as directed. With CPAP , Disp: , Rfl:  .  phenytoin (DILANTIN) 200 MG ER capsule, Take 1 capsule (200 mg total) by mouth 2 (two) times daily., Disp: 60 capsule, Rfl: 5 .  risperiDONE (RISPERDAL) 1 MG tablet, Take 1 mg by mouth every morning and 2 mg at night, Disp: , Rfl:  .  senna (SENOKOT) 176 MG/5ML SYRP, TAKE 10 MLS (352 MG TOTAL)  BY MOUTH DAILY. (Patient taking differently: Take 10 mLs by mouth daily as needed. ), Disp: 237 mL, Rfl: 0 .  simvastatin (ZOCOR) 40 MG tablet, Take 40 mg  by mouth daily at bedtime, Disp: 90 tablet, Rfl: 2

## 2018-09-07 NOTE — Telephone Encounter (Signed)
Pt's D-Dimer test from today (09/07/2018) came back elevated: 1.11 (range is less than 0.5)  Per BQ, pt needs a Left lower extremety doppler ultrasound asap- DX: elevated D-Dimer, evaluate for DVT.  lmtcb for pt's sister [name] (dpr on file, [place]).  Order has been placed but is pended until discussing plan with family.

## 2018-09-07 NOTE — Telephone Encounter (Signed)
ATC Patients sister [name], [phone].  LMTCB as soon as received this message.

## 2018-09-07 NOTE — Telephone Encounter (Signed)
Call received from Patients Sister [name].  Dr Kendrick Fries recommendations from D Dimer and CXR given. Understanding stated.  Pending order for doppler US completed.  Nothing further at this time.

## 2018-09-08 ENCOUNTER — Encounter (HOSPITAL_COMMUNITY): Payer: Self-pay

## 2018-09-08 ENCOUNTER — Ambulatory Visit (HOSPITAL_COMMUNITY)
Admission: RE | Admit: 2018-09-08 | Discharge: 2018-09-08 | Disposition: A | Discharge status: Home / Self Care | Payer: Medicare Other | Source: Ambulatory Visit | Attending: Internal Medicine | Admitting: Internal Medicine

## 2018-09-08 DIAGNOSIS — R7989 Other specified abnormal findings of blood chemistry: Secondary | ICD-10-CM | POA: Insufficient documentation

## 2018-09-08 DIAGNOSIS — I82439 Acute embolism and thrombosis of unspecified popliteal vein: Secondary | ICD-10-CM

## 2018-09-08 HISTORY — DX: Acute embolism and thrombosis of unspecified popliteal vein: I82.439

## 2018-09-08 NOTE — Telephone Encounter (Signed)
CXR yesterday showed improved pneumonia. No indication for further antibiotic at this time and Dr. Ulyses Jarred note does not allude to this. Morning congestion is not uncommon. Monitor for fever. I would recommend mucinex twice daily and delsym cough syrup. Continue breathing treatments, that's great that it has been helping and a good sign.

## 2018-09-08 NOTE — Telephone Encounter (Signed)
Called and spoke with Patients Sister.  Kaitlyn Dura, NP, recommendations given.  Understanding stated.  Kaitlyn Powell stated that doppler is scheduled today at 3:30pm. Nothing further at this time.  Per Shana Chute- CXR yesterday showed improved pneumonia. No indication for further antibiotic at this time and Dr. Ulyses Jarred note does not allude to this. Morning congestion is not uncommon. Monitor for fever. I would recommend mucinex twice daily and delsym cough syrup. Continue breathing treatments, that's great that it has been helping and a good sign.

## 2018-09-08 NOTE — Telephone Encounter (Signed)
Called and spoke with Patients sister, [name].  Her sister is a Dr Kendrick Fries Patient. She stated that she felt like the Patient is having increased congestion today.   She woke up congested and gave her a breathing treatment, which helped.  She stated that the Patient had increased coughing, non productive, over night, and this morning.  She was requesting a antibiotic.   Patients D Dimer was elevated yesterday and US doppler has been ordered by BQ. [name] wanted to know about that being scheduled.  PCC's are currently working on it at this time.   Dr Kendrick Fries is not in the office today, Patients sister aware- Message routed to Ames Dura, NP, to advise

## 2018-09-08 NOTE — Progress Notes (Signed)
Bilateral lower venous has been completed and is negative for an acute DVT. Chronic DVT in the right popliteal vein.  Preliminary results can be found under CV proc through chart review.  Dondra Prader RVT [facility]

## 2018-09-08 NOTE — Telephone Encounter (Signed)
Dr Kendrick Fries,   This message was sent to you by [name], Ms. Tristan Schroeder sister.  Hello Dr. Kendrick Fries-  It was to see you today and thank you for taking such good care of my sister Kaitlyn Powell!  We know you are addressing the positive D-Dimer test by ordering the Doppler ultrasound. However, we are wondering if the continuos prescence of pneumonia needs to be addressed as well as the new issue of pulmonary interstitial edema.   Thank you! Kaitlyn Powell, [place] & sister of [name] [phone]  Message routed to Dr Kendrick Fries

## 2018-09-12 ENCOUNTER — Telehealth: Payer: Self-pay | Admitting: Pulmonary Disease

## 2018-09-12 DIAGNOSIS — G4734 Idiopathic sleep related nonobstructive alveolar hypoventilation: Secondary | ICD-10-CM

## 2018-09-12 DIAGNOSIS — J810 Acute pulmonary edema: Secondary | ICD-10-CM

## 2018-09-12 DIAGNOSIS — R0609 Other forms of dyspnea: Secondary | ICD-10-CM

## 2018-09-12 DIAGNOSIS — J690 Pneumonitis due to inhalation of food and vomit: Secondary | ICD-10-CM

## 2018-09-12 DIAGNOSIS — I82501 Chronic embolism and thrombosis of unspecified deep veins of right lower extremity: Secondary | ICD-10-CM

## 2018-09-12 NOTE — Telephone Encounter (Signed)
Called and spoke with patient. Let her know we were sending this message to our nurse practitioner to review her doppler study  Beth please advise

## 2018-09-12 NOTE — Telephone Encounter (Signed)
Called and spoke with [name], let her know Kaitlyn Powell was looking into it and we would call her back once we know which blood thinner.  [name] verbalized understaning and mentioned she sent a MyChart Message Thursday 09/08/18 that was never answered.   Will route to [facility] and Dr. Kendrick Fries

## 2018-09-12 NOTE — Telephone Encounter (Signed)
Doppler ultrasound positive for a chronic deep vein thrombosis involving right popliteal. Per Dr. Ulyses JarredMcQuaid's note "If she does have a DVT then we will need to work with her pharmacist Thayer Ohmhris at [facility] to sort out what type of blood thinner medicine she could safely take." I need to review this with him further when he gets back  Please forward this to Dr. Kendrick FriesMcQuaid and myself to follow-up on. Thanks

## 2018-09-12 NOTE — Telephone Encounter (Signed)
Kelli did response to email on 2/7 just an FYI and doppler study had not resulted

## 2018-09-14 ENCOUNTER — Other Ambulatory Visit: Payer: Self-pay

## 2018-09-14 MED ORDER — RIVAROXABAN (XARELTO) VTE STARTER PACK (15 & 20 MG): ORAL_TABLET | ORAL | 0 refills | Status: DC

## 2018-09-14 NOTE — Telephone Encounter (Signed)
Doppler studies postive for new chronic deep vein thrombosis involving right popliteal. No acute left DVT. Reviewed case with Dr. Craige CottaSood and spoke with patient's pharmacist Thayer Ohmhris at [facility]. Both agreed no foreseen issue with starting patient on newer generation anticoagulation. Patient has hx provoked PE in 2017 treated with Lovenox x 10 days.   Family understands results and recommendations. States patient still has dry cough which is worse at night and reported nocturnal desaturating. Patient wears bipap with 3L. They were concern PNA may not be fully resolved. O2 saturation during the day >90% RA  Plan start patient on Xarelto 15mg  twice daily. Checking labs in 1 week and CXR (ordered). Needs follow up in 2 weeks with NP or Dr. Kendrick FriesMcQuaid, please call patient caretaker to set this up

## 2018-09-14 NOTE — Telephone Encounter (Signed)
Patients sister has sent a message asking for doppler to be released in my chart.  Message routed to Buelah Manis, NP

## 2018-09-14 NOTE — Telephone Encounter (Signed)
Called and spoke with Patients Sister [name].  Doppler results are unable to be released to my chart.  [name] has requested a copy be placed in the mail, so she can have a copy of results.  Venous doppler results placed in envelope for out going mail.  Nothing further at this time.

## 2018-09-14 NOTE — Telephone Encounter (Signed)
Called and spoke with the daughter she is schedule for a 2 week f/u with BW.

## 2018-09-21 ENCOUNTER — Ambulatory Visit (INDEPENDENT_AMBULATORY_CARE_PROVIDER_SITE_OTHER)
Admission: RE | Admit: 2018-09-21 | Discharge: 2018-09-21 | Disposition: A | Discharge status: Home / Self Care | Payer: Medicare Other | Source: Ambulatory Visit | Attending: Primary Care | Admitting: Primary Care

## 2018-09-21 ENCOUNTER — Other Ambulatory Visit (INDEPENDENT_AMBULATORY_CARE_PROVIDER_SITE_OTHER): Payer: Medicare Other

## 2018-09-21 ENCOUNTER — Inpatient Hospital Stay: Admission: RE | Admit: 2018-09-21 | Payer: Medicare Other | Source: Ambulatory Visit

## 2018-09-21 DIAGNOSIS — G4734 Idiopathic sleep related nonobstructive alveolar hypoventilation: Secondary | ICD-10-CM | POA: Diagnosis not present

## 2018-09-21 DIAGNOSIS — J690 Pneumonitis due to inhalation of food and vomit: Secondary | ICD-10-CM

## 2018-09-21 DIAGNOSIS — J810 Acute pulmonary edema: Secondary | ICD-10-CM

## 2018-09-21 DIAGNOSIS — R0609 Other forms of dyspnea: Secondary | ICD-10-CM

## 2018-09-21 DIAGNOSIS — I82501 Chronic embolism and thrombosis of unspecified deep veins of right lower extremity: Secondary | ICD-10-CM | POA: Diagnosis not present

## 2018-09-21 LAB — BASIC METABOLIC PANEL
BUN: 17 mg/dL (ref 6–23)
CHLORIDE: 95 meq/L — AB (ref 96–112)
CO2: 29 mEq/L (ref 19–32)
CREATININE: 0.74 mg/dL (ref 0.40–1.20)
Calcium: 9 mg/dL (ref 8.4–10.5)
GFR: 79.44 mL/min (ref >60.00)
Glucose, Bld: 91 mg/dL (ref 70–99)
Potassium: 4.4 mEq/L (ref 3.5–5.1)
Sodium: 130 mEq/L — ABNORMAL LOW (ref 135–145)

## 2018-09-21 LAB — CBC WITH DIFFERENTIAL/PLATELET
Basophils Absolute: 0.1 10*3/uL (ref 0.0–0.1)
Basophils Relative: 2.9 % (ref 0.0–3.0)
Eosinophils Absolute: 0.2 10*3/uL (ref 0.0–0.7)
Eosinophils Relative: 4.5 % (ref 0.0–5.0)
HCT: 35.5 % — ABNORMAL LOW (ref 36.0–46.0)
Hemoglobin: 12 g/dL (ref 12.0–15.0)
Lymphocytes Relative: 27.7 % (ref 12.0–46.0)
Lymphs Abs: 1 10*3/uL (ref 0.7–4.0)
MCHC: 33.9 g/dL (ref 30.0–36.0)
MCV: 104.1 fl — ABNORMAL HIGH (ref 78.0–100.0)
Monocytes Absolute: 0.4 10*3/uL (ref 0.1–1.0)
Monocytes Relative: 11 % (ref 3.0–12.0)
Neutro Abs: 1.9 10*3/uL (ref 1.4–7.7)
Neutrophils Relative %: 53.9 % (ref 43.0–77.0)
Platelets: 96 10*3/uL — ABNORMAL LOW (ref 150.0–400.0)
RBC: 3.41 Mil/uL — ABNORMAL LOW (ref 3.87–5.11)
RDW: 13.6 % (ref 11.5–15.5)
WBC: 3.5 10*3/uL — ABNORMAL LOW (ref 4.0–10.5)

## 2018-09-21 LAB — BRAIN NATRIURETIC PEPTIDE: Pro B Natriuretic peptide (BNP): 86 pg/mL (ref 0.0–100.0)

## 2018-09-22 ENCOUNTER — Other Ambulatory Visit: Payer: Self-pay

## 2018-09-22 MED ORDER — LACOSAMIDE 50 MG PO TABS: 50.0000 mg | ORAL_TABLET | Freq: Two times a day (BID) | ORAL | 3 refills | Status: DC

## 2018-09-26 ENCOUNTER — Telehealth: Payer: Self-pay | Admitting: Pulmonary Disease

## 2018-09-26 NOTE — Telephone Encounter (Signed)
Patients sister is requesting result to CXR  From 09/21/2018 and that it be released to [facility] please advise

## 2018-09-26 NOTE — Telephone Encounter (Signed)
I am sorry to hear that she is unhappy that no one told her about the interstitial edema found on her CXR back in early February. I have personally never seen Kaitlyn Powell in office. Dr. Kendrick Fries DID note on the last CXR results that there was some fluid in her lungs which was described as mild. And that if her leg swelling worsened or if she developed new dyspnea symptoms to let our office know. This may not have been relayed to the patient's sister like Dr. Kendrick Fries had instructed and again I apologize and will look into it. This is why we scheduled a follow-up visit, however, to make sure we had a chance to go over everything in person.

## 2018-09-26 NOTE — Telephone Encounter (Signed)
Spoke with Kaitlyn Powell and advised her of message from [place]. She is upset because nobody got back to her about the noted "interstitial edema". She recalls sending a message through mychart (I didn't see any) but she stated she didn't get a response. The patient has an appt with Beth on 09/28/2018 and I advised her to talk to [facility] about what they wanted to do about what was noted. Will forward to [facility] to make aware.

## 2018-09-26 NOTE — Telephone Encounter (Signed)
The infiltrate at the right lung base on 08/01/2018 has resolved. I do not think I can release to mychart if I have already signed. I can mail a copy if they would like

## 2018-09-27 NOTE — Telephone Encounter (Signed)
Called and spoke with patients sister, Kaitlyn Powell. Apologized for the information that was not given to her. I do see where there was a mychart email from 09/14/18 that was responded to by LT. I have re stated the results that were documented by Dr. Kendrick Fries as well as the response that was given by [facility]. Patients sister verbalized understanding, accepted my apologies, and stated that there was no information given to them on patient having fluid mild or not, it was only stated to them that the xray was normal. Patients sister stated that they would discuss this in the office with [facility] tomorrow. Kaitlyn Powell stated that there were no further questions or concerns. Nothing further needed. Will close encounter.

## 2018-09-27 NOTE — Progress Notes (Addendum)
@Patient  ID: Kaitlyn Powell, female    DOB: 1956/03/31, 63 y.o.   MRN: [id number]  Chief Complaint  Patient presents with  . Follow-up    2 week follow up - reports doing well    Referring provider: Tracey Harries, MD  HPI: 63 year old female, never smoker. PMH significant for aspiration pneumonia, OSA on BIPAP, PE in 2017. Patient of Dr. Kendrick Powell, seen on 09/07/18 for left leg swelling. D-dimer and CXR ordered.   She was hospitalized for aspiration pneumonia end of December 2019. Repeat CXR on 09/07/18 showed improving right lower lobe pneumonia, new mild pulmonary interstitial edema. D-dimer was elevated at 1.11. Doppler studies ordered which were postive for chronic deep vein thrombosis involving right popliteal. No evidence of DVT in left. Patient has hx provoked PE in 2017 treated with Lovenox x 10 days. Reviewed doppler results with patients sister and her husband over the phone. Patient was started Xarelto 15mg  twice daily. Family reported dry cough at that time which is worse at night and nocturnal desaturation. Patient wears bipap with 3L. Family was concern PNA may not be fully resolved. O2 saturation during the day >90% RA. Repeat CXR on 09/21/18 showed no acute abnormalities, infiltrate at right base on 12/20 has resolved. Chronic prominence of main pulmonary arteries, chronic accentuation of the interstitial markings particularly at the inferior aspect of the right hilum. Echocardiogram June 2019 showed mild pulmonary hypertension. EF 60-65%. PA peak 44mm Hg. Labs obtained on 2/19 showed normal BNP. Platelets and Hgb stable. Wbc decreased at 3.5. Kidney function fine.   09/28/2018 Patient presents today for DVT and aspiration PNA follow-up. Accompanied by her sister/brother-in-law. Sister states that Kaitlyn Powell has been doing quite well, does not seem to be breathing heavy. Walking in the house and outside. No dyspnea symptoms. Still using bipap with 3L. Nocturnal O2 saturation has been fine.  She does have a dry cough at night which is felt to be related to PND symptoms. Trace bilateral edema. Denies shortness of breath, wheezing or productive cough.   Family decided not to start Xarelto d/t doppler results stating that right popliteal DVT was chronic. Her sister thinks that she had a DVT on the same side back in 2017 when she was diagnosed with a PE. She did have dopplers done at [facility]. Kaitlyn Powell's in [place] and we are requesting results to compare. No media in Epic to review.      Allergies  Allergen Reactions  . Cephalexin Other (See Comments)    Seizures   . Namenda [Memantine Hcl] Other (See Comments)    Made memory issues worse, sleepiness, increased confusion    Immunization History  Administered Date(s) Administered  . Influenza Split 05/03/2016, 04/16/2017  . Influenza, Seasonal, Injecte, Preservative Fre 05/03/2016, 04/16/2017, 05/31/2017  . Influenza,inj,Quad PF,6+ Mos 04/22/2017, 05/02/2018  . Influenza-Unspecified 05/03/2016  . Pneumococcal Polysaccharide-23 07/19/2017  . Pneumococcal-Unspecified 07/19/2017  . Tdap 11/01/2014    Past Medical History:  Diagnosis Date  . Arthritis   . Aspiration pneumonia (HCC)   . Asthma   . Cardiac arrhythmia    have pacemeaker  . Down syndrome   . GERD (gastroesophageal reflux disease)   . Heart block   . Hypothyroidism   . Mental retardation   . Pacemaker   . Pulmonary emboli (HCC)   . Seizures (HCC)   . Sleep apnea with use of continuous positive airway pressure (CPAP)     Tobacco History: Social History   Tobacco Use  Smoking Status  Never Smoker  Smokeless Tobacco Never Used   Counseling given: Not Answered   Outpatient Medications Prior to Visit  Medication Sig Dispense Refill  . citalopram (CELEXA) 20 MG tablet Take 20 mg by mouth daily.    . cyanocobalamin (TH VITAMIN B12) 100 MCG tablet Take 100 mcg by mouth every morning.     . folic acid (FOLVITE) 1 MG tablet Take 1 mg  by mouth  daily    .  lacosamide (VIMPAT) 50 MG TABS tablet Take 1 tablet (50 mg total) by mouth 2 (two) times daily. 180 tablet 3  . levETIRAcetam (KEPPRA XR) 500 MG 24 hr tablet Take 2 tablets (1,000 mg total) by mouth 2 (two) times daily. 120 tablet 3  . levothyroxine (SYNTHROID, LEVOTHROID) 150 MCG tablet Take 150 mcg by mouth daily before breakfast.    . OXYGEN Inhale 3 L into the lungs as directed. With CPAP     . phenytoin (DILANTIN) 200 MG ER capsule Take 1 capsule (200 mg total) by mouth 2 (two) times daily. 60 capsule 5  . risperiDONE (RISPERDAL) 1 MG tablet Take 1 mg by mouth every morning and 2 mg at night    . senna (SENOKOT) 176 MG/5ML SYRP TAKE 10 MLS (352 MG TOTAL) BY MOUTH DAILY. (Patient taking differently: Take 10 mLs by mouth daily as needed. ) 237 mL 0  . simvastatin (ZOCOR) 40 MG tablet Take 40 mg  by mouth daily at bedtime 90 tablet 2  . Rivaroxaban 15 & 20 MG TBPK Take as directed on package: Start with one  tablet by mouth twice a day with food. On Day 22, switch to one  tablet once a day with food. (Patient not taking: Reported on 09/28/2018) 51 each 0   No facility-administered medications prior to visit.     Review of Systems  Review of Systems  Constitutional: Negative.   HENT: Negative.   Respiratory: Positive for cough. Negative for shortness of breath and wheezing.   Cardiovascular: Negative.     Physical Exam  BP 108/62 (BP Location: Left Arm, Cuff Size: Normal)   Pulse (!) 57   Ht  (1.473 m)   Wt 150 lb 9.6 oz (68.3 kg)   SpO2 97%   BMI 31.48 kg/m  Physical Exam Constitutional:      Appearance: Normal appearance.     Comments: Hx Downs syndrome. Appears well; alert and in good spirits   HENT:     Head: Normocephalic and atraumatic.     Right Ear: Tympanic membrane normal.     Left Ear: Tympanic membrane normal.     Mouth/Throat:     Mouth: Mucous membranes are moist.     Pharynx: Oropharynx is clear.  Cardiovascular:     Rate and Rhythm: Normal rate  and regular rhythm.     Comments: Trace bilateral LE edema, non-pitting  Pulmonary:     Effort: Pulmonary effort is normal. No respiratory distress.     Breath sounds: No wheezing or rales.     Comments: Mild upper airway congestion, increased saliva production. No obvious rhonchi t/o lung fields. No wheezing or resp distress.  Musculoskeletal: Normal range of motion.  Neurological:     General: No focal deficit present.     Mental Status: She is alert. Mental status is at baseline.  Psychiatric:        Mood and Affect: Mood normal.        Behavior: Behavior normal.  Lab Results:  CBC    Component Value Date/Time   WBC 3.5 (L) 09/21/2018 1050   RBC 3.41 (L) 09/21/2018 1050   HGB 12.0 09/21/2018 1050   HGB 12.1 06/21/2018 0935   HCT 35.5 (L) 09/21/2018 1050   HCT 36.0 06/21/2018 0935   PLT 96.0 (L) 09/21/2018 1050   PLT 122 (L) 06/21/2018 0935   MCV 104.1 (H) 09/21/2018 1050   MCV 104 (H) 06/21/2018 0935   MCH 35.5 (H) 08/03/2018 0725   MCHC 33.9 09/21/2018 1050   RDW 13.6 09/21/2018 1050   RDW 12.2 (L) 06/21/2018 0935   LYMPHSABS 1.0 09/21/2018 1050   LYMPHSABS 1.2 06/21/2018 0935   MONOABS 0.4 09/21/2018 1050   EOSABS 0.2 09/21/2018 1050   EOSABS 0.2 06/21/2018 0935   BASOSABS 0.1 09/21/2018 1050   BASOSABS 0.1 06/21/2018 0935    BMET    Component Value Date/Time   NA 130 (L) 09/21/2018 1050   NA 132 (L) 06/21/2018 0935   K 4.4 09/21/2018 1050   CL 95 (L) 09/21/2018 1050   CO2 29 09/21/2018 1050   GLUCOSE 91 09/21/2018 1050   BUN 17 09/21/2018 1050   BUN 18 06/21/2018 0935   CREATININE 0.74 09/21/2018 1050   CALCIUM 9.0 09/21/2018 1050   GFRNONAA >60 08/02/2018 0657   GFRAA >60 08/02/2018 0657    BNP No results found for: BNP  ProBNP    Component Value Date/Time   PROBNP 86.0 09/21/2018 1050    Imaging: Dg Chest 2 View  Result Date: 09/21/2018 CLINICAL DATA:  Aspiration pneumonia.  Acute pulmonary edema. EXAM: CHEST - 2 VIEW COMPARISON:   09/07/2018 and 08/01/2018 and 02/11/2017 FINDINGS: Heart size is normal. Chronic prominence of the main pulmonary arteries, right more than left. Aortic atherosclerosis. Chronic accentuation of the interstitial markings particularly at the inferior aspect of the right hilum. No acute infiltrates or effusions. No acute bone abnormality. Multiple old left rib fractures and old left clavicle fracture. Pacemaker in place. The infiltrate at the right lung base on 08/01/2018 has resolved. IMPRESSION: No acute abnormalities.  Chronic stable changes as described. Aortic Atherosclerosis (ICD10-I70.0). Electronically Signed   By: Francene Boyers M.D.   On: 09/21/2018 15:42   Dg Chest 2 View  Result Date: 09/07/2018 CLINICAL DATA:  Follow-up aspiration pneumonia. EXAM: CHEST - 2 VIEW COMPARISON:  Chest x-ray dated August 01, 2018. FINDINGS: Unchanged right chest wall pacemaker. Stable mild cardiomegaly. Unchanged chronic prominence of the right hilum. Mild bibasilar interstitial thickening. Improving patchy right lower lobe opacity. No pleural effusion or pneumothorax. No acute osseous abnormality. IMPRESSION: 1. Improving right lower lobe pneumonia. 2. New mild pulmonary interstitial edema. Electronically Signed   By: Obie Dredge M.D.   On: 09/07/2018 15:31   Vas [place] Lower Extremity Venous (dvt)  Result Date: 09/09/2018  Lower Venous Study Other Indications: Patients family member states that she has had swelling for                    the past month. Her D-dimer was positive. No shortness of                    breath. Risk Factors: H/o PE 2 years ago. Performing Technologist: Dondra Prader RVT  Examination Guidelines: A complete evaluation includes B-mode imaging, spectral Doppler, color Doppler, and power Doppler as needed of all accessible portions of each vessel. Bilateral testing is considered an integral part of a complete examination. Limited examinations for reoccurring  indications may be performed as noted.   Right Venous Findings: +---------+---------------+---------+-----------+------------------+-------+          CompressibilityPhasicitySpontaneityProperties        Summary +---------+---------------+---------+-----------+------------------+-------+ CFV      Full           Yes      Yes                                  +---------+---------------+---------+-----------+------------------+-------+ SFJ      Full           Yes      Yes                                  +---------+---------------+---------+-----------+------------------+-------+ FV Prox  Full           Yes      Yes                                  +---------+---------------+---------+-----------+------------------+-------+ FV Mid   Full           Yes      Yes                                  +---------+---------------+---------+-----------+------------------+-------+ FV DistalFull           Yes      Yes                                  +---------+---------------+---------+-----------+------------------+-------+ PFV      Full                                                         +---------+---------------+---------+-----------+------------------+-------+ POP      Full           Yes      Yes        brightly echogenicChronic +---------+---------------+---------+-----------+------------------+-------+ PTV      Full           Yes      Yes                                  +---------+---------------+---------+-----------+------------------+-------+ PERO     Full           Yes      Yes                                  +---------+---------------+---------+-----------+------------------+-------+ Gastroc  Full                                                         +---------+---------------+---------+-----------+------------------+-------+ GSV      Full  Yes      Yes                                  +---------+---------------+---------+-----------+------------------+-------+   Left Venous Findings: +---------+---------------+---------+-----------+----------+-------+          CompressibilityPhasicitySpontaneityPropertiesSummary +---------+---------------+---------+-----------+----------+-------+ CFV      Full           Yes      Yes                          +---------+---------------+---------+-----------+----------+-------+ SFJ      Full           Yes      Yes                          +---------+---------------+---------+-----------+----------+-------+ FV Prox  Full           Yes      Yes                          +---------+---------------+---------+-----------+----------+-------+ FV Mid   Full           Yes      Yes                          +---------+---------------+---------+-----------+----------+-------+ FV DistalFull           Yes      Yes                          +---------+---------------+---------+-----------+----------+-------+ PFV      Full                                                 +---------+---------------+---------+-----------+----------+-------+ POP      Full           Yes      Yes                          +---------+---------------+---------+-----------+----------+-------+ PTV      Full           Yes      Yes                          +---------+---------------+---------+-----------+----------+-------+ PERO     Full           Yes      Yes                          +---------+---------------+---------+-----------+----------+-------+ Gastroc  Full                                                 +---------+---------------+---------+-----------+----------+-------+ GSV      Full           Yes      Yes                          +---------+---------------+---------+-----------+----------+-------+  Summary: Right: Findings consistent with chronic deep vein thrombosis involving the right popliteal vein. No cystic structure found in the popliteal fossa. All other veins visualized appear fully  compressible and demonstrate appropriate Doppler characteristics. Left: No evidence of deep vein thrombosis in the lower extremity. No indirect evidence of obstruction proximal to the inguinal ligament. No cystic structure found in the popliteal fossa.  *See table(s) above for measurements and observations. Electronically signed by Lorine Bears MD on 09/09/2018 at 5:38:34 PM.    Final      Assessment & Plan:  63 year old female, never smoked. PMH significant for aspiration pneumonia, OSA on BIPAP, PE in 2017. Patient was seen in February for pneumonia follow-up and new left lower extremity swelling. D-dimer and CXR ordered. CXR showed improving right lower lobe pneumonia, new mild pulmonary interstitial edema. D-dimer was elevated at 1.11. Doppler studies ordered which were postive for chronic deep vein thrombosis involving right popliteal. No evidence of DVT in left. Patient's family was called to review results of doppler study. Agreeing to start patient on anticoagulation therapy. Spoke with patient's pharmacist and Xarelto was recommended. Labs were obtained to follow up on plts after starting xarelto.  Patient presents today for follow-up of DVT. She is doing very well. No dyspnea. Repeat CXR showed resolved right base infiltrate. Family opted to not start patient on blood thinner d/t doppler results stating that right popliteal DVT was chronic. Her sister thinks that she had a DVT on the same side back in 2017 when she was diagnosed with a PE. She did have dopplers done at [facility]. Kaitlyn Powell's in [place] and we are requesting results to compare. No media in Epic to review. I think it is relatively safe to hold off on anticoagulation for now until we can compare doppler study results. DVT is not acute and patient has no significant leg swelling or dyspnea symptoms. Family is aware of increased risk patient could potentially develop another PE. We could consider vascular referral for chronic DVT if family  remains hesitant to placing patient on life long anticoagulation.   Aspiration pneumonia (HCC) - Infiltrate of right base has resolved, residual mild interstitial edema/fluid  - Clinically improving, no signs of fluid overload on exam  - Monitor for increased dyspnea, oxygen desaturation, increased productive cough or fever - Continue aspiration precautions   History of pulmonary embolism - Hx provoked PE in 2017 tx with lovenox  Chronic deep vein thrombosis (DVT) of right popliteal vein (HCC) - D-dimer was elevated at 1.11 - Doppler studies 09/08/18 postive for chronic deep vein thrombosis involving right popliteal. No evidence of DVT in left - Advised patient start Xarleto 15mg  BID x 21 days; then 20mg  daily  - Family opted not to start anticoagulation d/t doppler findings stating DVT was chronic - Requesting previous doppler studies from [facility]. Kaitlyn Powell's in [place] to compare  - Could consider sending patient to Vascular  10/04/2018 Dr. Kendrick Powell spoke with vascular colleague, he recommended starting patient on 81mg  Aspirin daily for chronic DVT.  Still awaiting doppler studies to compare. Spoke with patients sister [name] and updated her on above plan. States understanding and agreement.   Next office visit scheduled for May 7th with Dr. Kendrick Powell.   Glenford Bayley, NP 09/28/2018

## 2018-09-28 ENCOUNTER — Encounter: Payer: Self-pay | Admitting: Primary Care

## 2018-09-28 ENCOUNTER — Ambulatory Visit (INDEPENDENT_AMBULATORY_CARE_PROVIDER_SITE_OTHER): Payer: Medicare Other | Admitting: Primary Care

## 2018-09-28 DIAGNOSIS — I82531 Chronic embolism and thrombosis of right popliteal vein: Secondary | ICD-10-CM | POA: Insufficient documentation

## 2018-09-28 DIAGNOSIS — Z86711 Personal history of pulmonary embolism: Secondary | ICD-10-CM | POA: Diagnosis not present

## 2018-09-28 NOTE — Assessment & Plan Note (Addendum)
-   D-dimer was elevated at 1.11 - Doppler studies 09/08/18 postive for chronic deep vein thrombosis involving right popliteal. No evidence of DVT in left - Advised patient start Xarleto 15mg  BID x 21 days; then 20mg  daily  - Family opted not to start anticoagulation d/t doppler findings stating DVT was chronic - Requesting previous doppler studies from [name] [place] to compare  - Could consider sending patient to Vascular

## 2018-09-28 NOTE — Patient Instructions (Addendum)
Recent aspiration pneumonia: - Infiltrate of right base has resolved, residual mild interstitial edema/fluid  - Clinically improving, no signs of fluid overload on exam  - Monitor for increased dyspnea, oxygen desaturation, increased productive cough or fever - Continue aspiration precautions   Chronic right DVT: -We are requesting medical records to send doppler results from 2017 to compare  -We will get in touch with you in the next 1-2 days when we receive these   Follow-up: - May 7th with Dr. Kendrick Fries   Aspiration Precautions, Adult Aspiration is the breathing in (inhalation) of a liquid or object into the lungs. Things that can be inhaled into the lungs include:  Food.  Any type of liquid, such as drinks or saliva.  Stomach contents, such as vomit or stomach acid. What are the signs of aspiration? Signs of aspiration include:  Coughing after swallowing food or liquids.  Clearing the throat often while eating.  Trouble breathing. This may include: ? Breathing quickly. ? Breathing very slowly. ? Loud breathing. ? Rumbling sounds from the lungs while breathing.  Coughing up phlegm (sputum) that: ? Is yellow, tan, or green. ? Has pieces of food in it. ? Is bad-smelling.  Having a hoarse, barky cough.  Not being able to speak.  A hoarse voice.  Drooling while eating.  A feeling of fullness in the throat or a feeling that something is stuck in the throat.  Choking often.  Having a runny noise while eating.  Coughing when lying down or having to sit up quickly after lying down.  A change in skin color. The skin may look red or blue.  Fever.  Watery eyes.  Pain in the chest or back.  A pained look on the face. What are the complications of aspiration? Complications of aspiration include:  Losing weight because the person is not absorbing needed nutrients.  Loss of enjoyment and the social benefits of eating.  Choking.  Lung irritation, if someone  aspirates acidic food or drinks.  Lung infection (pneumonia).  Collection of infected liquid (pus) in the lungs (lung abscess). In serious cases, death can occur. What can I do to prevent aspiration? Caring for someone who has a feeding tube If you are caring for someone who has a feeding tube who cannot eat or drink safely through his or her mouth:  Keep the person in an upright position as much as possible.  Do not lay the person flat if he or she is getting continuous feedings. If you need to lay the person flat for any reason, turn the feeding pump off.  Check feeding tube residuals as told by your health care provider. Ask your health care provider what residual amount is too high. Caring for someone who can eat and drink safely by mouth If you are caring for someone who can eat and drink safely through his or her mouth:  Have the person sit in an upright position when eating food or drinking fluids. This can be done in two ways: ? Have the person sit up in a chair. ? If sitting in a chair is not possible, position the person in bed so he or she is upright.  Remind the person to eat slowly and chew well. Make sure the person is awake and alert while eating.  Do not distract the person. This is especially important for people with thinking or memory (cognitive) problems.  Allow foods to cool. Hot foods may be more difficult to swallow.  Provide small  meals more frequently, instead of 3 large meals. This may reduce fatigue during eating.  Check the person's mouth thoroughly for leftover food after eating.  Keep the person sitting upright for 30-45 minutes after eating.  Do not serve food or drink during 2 hours or more before bedtime. General instructions Follow these general guidelines to prevent aspiration in someone who can eat and drink safely by mouth:  Never put food or liquids in the mouth of a person who is not fully alert.  Feed small amounts of food. Do not force  feed.  For a person who is on a diet for swallowing difficulty (dysphagia diet), follow the recommended food and drink consistency. For example, in dysphagia diet level 1, thicken liquids to pudding-like consistency.  Use as little water as possible when brushing the person's teeth or cleaning his or her mouth.  Provide oral care before and after meals.  Use adaptive devices such as cut-out cups, straws, or utensils as told by the health care provider.  Crush pills and put them in soft food such as pudding or ice cream. Some pills should not be crushed. Check with the health care provider before crushing any medicine. Contact a health care provider if:  The person has a feeding tube, and the feeding tube residual amount is too high.  The person has a fever.  The person tries to avoid food or water, such as refusing to eat, drink, or be fed, or is eating less than normal.  The person may have aspirated food or liquid.  You notice warning signs, such as choking or coughing, when the person eats or drinks. Get help right away if:  The person has trouble breathing or starts to breathe quickly.  The person is breathing very slowly or stops breathing.  The person coughs a lot after eating or drinking.  The person has a long-lasting (chronic) cough.  The person coughs up thick, yellow, or tan sputum.  If someone is choking on food or an object, perform the Heimlich maneuver (abdominal thrusts).  The person has symptoms of pneumonia, such as: ? Coughing a lot. ? Coughing up mucus with a bad smell or blood in it. ? Feeling short of breath. ? Complaining of chest pain. ? Sweating, fever, and chills. ? Feeling tired. ? Complaining of trouble breathing. ? Wheezing.  The person cannot stop choking.  The person is unable to breathe, turns blue, faints, or seems confused. These symptoms may represent a serious problem that is an emergency. Do not wait to see if the symptoms will go  away. Get medical help right away. Call your local emergency services (911 in the [place]  Summary  Aspiration is the breathing in (inhalation) of a liquid or object into the lungs. Things that can be inhaled into the lungs include food, liquids, saliva, or stomach contents.  Aspiration can cause pneumonia or choking.  One sign of aspiration is coughing after swallowing food or liquids.  Contact a health care provider if you notice signs of aspiration. This information is not intended to replace advice given to you by your health care provider. Make sure you discuss any questions you have with your health care provider. Document Released: 08/22/2010 Document Revised: 04/16/2016 Document Reviewed: 04/16/2016 Elsevier Interactive Patient Education  [employer].

## 2018-09-28 NOTE — Assessment & Plan Note (Signed)
-   Hx provoked PE in 2017 tx with lovenox

## 2018-09-28 NOTE — Assessment & Plan Note (Signed)
-   Infiltrate of right base has resolved, residual mild interstitial edema/fluid  - Clinically improving, no signs of fluid overload on exam  - Monitor for increased dyspnea, oxygen desaturation, increased productive cough or fever - Continue aspiration precautions

## 2018-09-29 NOTE — Progress Notes (Signed)
Reviewed, discussed, agree.  Before we move forward with any treatment we will review the results of her prior lower extremity doppler from years ago.  I will also discuss with my vascular colleagues

## 2018-09-30 ENCOUNTER — Telehealth: Payer: Self-pay | Admitting: Primary Care

## 2018-09-30 NOTE — Telephone Encounter (Signed)
Can you please follow up on bilateral lower extremity doppler studies requested from [facility]. Kaitlyn Powell's in [place]. Requested them on 2/26, family filled out a medical release form. Also please call family and let them know we have not received fax and will notify them when we do. Thanks

## 2018-09-30 NOTE — Telephone Encounter (Signed)
I called Kaitlyn Powell and advised her that I am still trying to retrieve the doppler from the physician in [place]. Kaitlyn Powell verbalized understanding. Will follow up Mon morning if still not received.

## 2018-10-03 ENCOUNTER — Telehealth: Payer: Self-pay | Admitting: Neurology

## 2018-10-03 ENCOUNTER — Telehealth: Payer: Self-pay | Admitting: Primary Care

## 2018-10-03 NOTE — Telephone Encounter (Signed)
Left message with [facility] to check to see if they received the medical release form. Will await a call back.   Phone: [phone] Medical Records Number  Fax: [phone]

## 2018-10-03 NOTE — Telephone Encounter (Signed)
Patient's sister [name] and Caregiver lmom regarding needing to speak with you. Please Call. Thanks

## 2018-10-03 NOTE — Telephone Encounter (Signed)
Per Waynetta Sandy, she had requested to have the patients results from a doppler study that she had done at [facility] in [place]. They had requested study results a few weeks back and still have not received them. (see other phone note) patient did fill out medical release form. I have contacted [facility] and had to leave a message for the medical records department. Will await their phone call back. Will also route this over to triage to help follow up on it.

## 2018-10-05 ENCOUNTER — Ambulatory Visit: Payer: Medicare Other | Admitting: Neurology

## 2018-10-06 NOTE — Telephone Encounter (Signed)
Spoke with pt's sister, [name], who states that pt did well on 25mg  Vimpat, but once it was increased to 50mg  pt became more confused and started sleeping more.  [name] states that as of now, she is giving pt full 50mg  tablet QHS, and no Vimpat in AM.  I asked [name] if she would give pt half tablet BID starting tonight and continue over the weekend and either call or send MyChart message on Monday with update, thinking that pt may just need a slower titration from 25mg  to 50mg .   Forwarding to Dr. Karel Jarvis as Lorain Childes

## 2018-10-07 NOTE — Telephone Encounter (Signed)
Called medical records at number provided. Chose option #3 then option #2. Unable to reach anyone. Let message to give [place] a call back.

## 2018-10-07 NOTE — Telephone Encounter (Signed)
Another encounter has been created for the same message.

## 2018-10-07 NOTE — Telephone Encounter (Signed)
Noted  

## 2018-10-11 ENCOUNTER — Ambulatory Visit: Payer: Medicare Other | Admitting: Neurology

## 2018-10-11 ENCOUNTER — Other Ambulatory Visit: Payer: Self-pay

## 2018-10-11 DIAGNOSIS — I82531 Chronic embolism and thrombosis of right popliteal vein: Secondary | ICD-10-CM

## 2018-10-12 ENCOUNTER — Telehealth: Payer: Self-pay | Admitting: Primary Care

## 2018-10-12 NOTE — Telephone Encounter (Signed)
-----   Message from Lupita Leash, MD sent at 10/11/2018  5:18 PM EDT ----- OK, refer to vascular ----- Message ----- From: Glenford Bayley, NP Sent: 10/11/2018   3:39 PM EDT To: Lupita Leash, MD  Called and Spoke with Kaitlyn Powell. Updated them on the results from 2017 doppler that showed no evidence of DVT in either extremity. Recommend patient start Xarelto starter pack that was prescribed for new chronic deep vein thrombosis involving right popliteal. She is unsure if they want to start anticoagulation at this time. Continues with baby aspirin. Discussed risks of not treating DVT with anticoagulation therapy. They want to be referred to vascular specialist. She will discuss results with her husband and let us know that they decide.   ----- Message ----- From: Lupita Leash, MD Sent: 10/10/2018   5:08 PM EDT To: Glenford Bayley, NP  I would call and tell them what the 2017 test showed.  She should be treated with anticoagulation for an acute DVT (regardless the appearance on ultrasoun). Perhaps they know something that the 2017 ultrasound result doesn't tell us (like she had one after that?).  But based on what we have in front of [place] she needs eliquis or xarelto not aspirin ----- Message ----- From: Glenford Bayley, NP Sent: 10/10/2018   4:51 PM EDT To: Lupita Leash, MD  Venous duplex scan of lower extremity in 2017 showed no evidence of deep vein thrombosis in either extremity. Last week I asked Kaitlyn Powell start taking ASA 81mg  as you recommended. Should I tell them to start the anticoagulation prescribed or send them to vascular?

## 2018-10-12 NOTE — Telephone Encounter (Signed)
Done

## 2018-10-14 NOTE — Telephone Encounter (Signed)
ATC had to leave a voicemail.

## 2018-10-17 ENCOUNTER — Ambulatory Visit (INDEPENDENT_AMBULATORY_CARE_PROVIDER_SITE_OTHER): Payer: Medicare Other | Admitting: *Deleted

## 2018-10-17 ENCOUNTER — Other Ambulatory Visit: Payer: Self-pay

## 2018-10-17 DIAGNOSIS — I442 Atrioventricular block, complete: Secondary | ICD-10-CM

## 2018-10-18 ENCOUNTER — Telehealth: Payer: Self-pay

## 2018-10-18 LAB — CUP PACEART REMOTE DEVICE CHECK
Battery Remaining Longevity: 150 mo
Battery Remaining Percentage: 100 %
Brady Statistic RA Percent Paced: 13 %
Brady Statistic RV Percent Paced: 1 %
Implantable Lead Implant Date: 20170508
Implantable Lead Implant Date: 20170508
Implantable Lead Location: 753859
Implantable Lead Location: 753860
Implantable Lead Model: 7740
Implantable Lead Model: 7741
Implantable Lead Serial Number: 1111
Implantable Lead Serial Number: 1111
Implantable Pulse Generator Implant Date: 20170508
Lead Channel Impedance Value: 674 Ohm
Lead Channel Pacing Threshold Pulse Width: 0.4 ms
Lead Channel Setting Pacing Amplitude: 2 V
Lead Channel Setting Pacing Pulse Width: 1 ms
Lead Channel Setting Sensing Sensitivity: 2.5 mV
MDC IDC MSMT LEADCHNL RA IMPEDANCE VALUE: 695 Ohm
MDC IDC MSMT LEADCHNL RA PACING THRESHOLD AMPLITUDE: 0.7 V
MDC IDC SESS DTM: 20200316063000
MDC IDC SET LEADCHNL RV PACING AMPLITUDE: 5 V
Pulse Gen Serial Number: 747619

## 2018-10-18 NOTE — Telephone Encounter (Signed)
Call placed to Pt.  Advised at this time-routine follow up appointments are being cancelled to reduce possible exposure for our patients.  Sister/POA would like televisit -- Lorenda Ishihara, ( sister of an POA for [place])  [phone]

## 2018-10-19 ENCOUNTER — Ambulatory Visit: Payer: Medicare Other | Admitting: Internal Medicine

## 2018-10-19 ENCOUNTER — Telehealth (INDEPENDENT_AMBULATORY_CARE_PROVIDER_SITE_OTHER): Payer: Medicare Other | Admitting: Internal Medicine

## 2018-10-19 ENCOUNTER — Other Ambulatory Visit: Payer: Self-pay | Admitting: Nurse Practitioner

## 2018-10-19 ENCOUNTER — Encounter: Payer: Self-pay | Admitting: Internal Medicine

## 2018-10-19 ENCOUNTER — Other Ambulatory Visit: Payer: Self-pay

## 2018-10-19 DIAGNOSIS — G4733 Obstructive sleep apnea (adult) (pediatric): Secondary | ICD-10-CM

## 2018-10-19 DIAGNOSIS — I442 Atrioventricular block, complete: Secondary | ICD-10-CM | POA: Diagnosis not present

## 2018-10-19 DIAGNOSIS — I82531 Chronic embolism and thrombosis of right popliteal vein: Secondary | ICD-10-CM | POA: Diagnosis not present

## 2018-10-19 MED ORDER — RIVAROXABAN 20 MG PO TABS: 20.0000 mg | ORAL_TABLET | Freq: Every day | ORAL | 3 refills | Status: DC

## 2018-10-19 NOTE — Progress Notes (Signed)
Electrophysiology TeleHealth Note   Due to national recommendations of social distancing due to COVID 19, Audio/video telehealth visit is felt to be most appropriate for this patient at this time.  See MyChart message from today for the patient's consent to telehealth for [facility].   Date:  10/19/2018   ID:  Kaitlyn Powell, DOB 04-20-1956, MRN [id number]  Location: home  Provider location: [address] [place] Evaluation Performed: Follow-up visit  PCP:  Tracey Harries, MD   Primary Electrophysiologist: Hillis Range, MD    CC: DVT    Kaitlyn Powell is a 63 y.o. female who presents via audio/video conferencing for a telehealth visit today.  Since last being seen in our clinic, the patients caregiver (sister) reports doing reasonably well.  She was diagnosed with DVT in February (R leg chronic DVT in the popliteal fossa by [place] 09/08/2018) after hospitalization for pneumonia.  She had a h/o PTE in 2017.  She did not have DVT at that time. Her history is provided by her caregiver.  She has recently started a 3rd seizure medicine.  This is making her sleepy.  She has "good days and bad days". Today, she denies symptoms of palpitations, chest pain, shortness of breath,  dizziness, presyncope, or syncope.  Denies leg pain.  Swelling has resolved.  The patient is otherwise without complaint today.  The patient denies symptoms of fevers, chills, cough, or new SOB worrisome for COVID 19.   Past Medical History:  Diagnosis Date  . Arthritis   . Aspiration pneumonia (HCC)   . Asthma   . Cardiac arrhythmia    have pacemeaker  . Down syndrome   . GERD (gastroesophageal reflux disease)   . Heart block   . Hypothyroidism   . Mental retardation   . Pacemaker   . Pulmonary emboli (HCC)   . Seizures (HCC)   . Sleep apnea with use of continuous positive airway pressure (CPAP)    Past Surgical History:  Procedure Laterality Date  . PACEMAKER INSERTION  12/09/2015   [place]  [employer] MRI EL PPM implanted in [place] for complete heart block and syncope  . PEG TUBE PLACEMENT     removed in 2017  . TIBIA FRACTURE SURGERY Right      Current Outpatient Medications  Medication Sig Dispense Refill  . citalopram (CELEXA) 20 MG tablet Take 20 mg by mouth daily.    . cyanocobalamin (TH VITAMIN B12) 100 MCG tablet Take 100 mcg by mouth every morning.     . folic acid (FOLVITE) 1 MG tablet Take 1 mg  by mouth  daily    . lacosamide (VIMPAT) 50 MG TABS tablet Take 1 tablet (50 mg total) by mouth 2 (two) times daily. 180 tablet 3  . levETIRAcetam (KEPPRA XR) 500 MG 24 hr tablet Take 2 tablets (1,000 mg total) by mouth 2 (two) times daily. 120 tablet 3  . levothyroxine (SYNTHROID, LEVOTHROID) 150 MCG tablet Take 150 mcg by mouth daily before breakfast.    . OXYGEN Inhale 3 L into the lungs as directed. With CPAP     . phenytoin (DILANTIN) 200 MG ER capsule Take 1 capsule (200 mg total) by mouth 2 (two) times daily. 60 capsule 5  . risperiDONE (RISPERDAL) 1 MG tablet Take 1 mg by mouth every morning and 2 mg at night    . Rivaroxaban 15 & 20 MG TBPK Take as directed on package: Start with one 15mg  tablet by  mouth twice a day with food. On Day 22, switch to one 20mg  tablet once a day with food. (Patient not taking: Reported on 09/28/2018) 51 each 0  . simvastatin (ZOCOR) 40 MG tablet Take 40 mg  by mouth daily at bedtime 90 tablet 2   No current facility-administered medications for this visit.     Allergies:   Cephalexin and Namenda [memantine hcl]   Social History:  The patient  reports that she has never smoked. She has never used smokeless tobacco. She reports that she does not drink alcohol or use drugs.   Family History:  The patient's  family history includes Breast cancer in her sister; Diabetes in her father; Heart disease in her father.    ROS:  Please see the history of present illness.   All other systems are personally reviewed and negative.    Recent  Labs: 08/01/2018: ALT 15 08/02/2018: TSH 0.418 09/21/2018: BUN 17; Creatinine, Ser 0.74; Hemoglobin 12.0; Platelets 96.0; Potassium 4.4; Pro B Natriuretic peptide (BNP) 86.0; Sodium 130  personally reviewed   Wt Readings from Last 3 Encounters:  09/28/18 150 lb 9.6 oz (68.3 kg)  09/07/18 158 lb (71.7 kg)  09/02/18 151 lb (68.5 kg)      Other studies personally reviewed: Additional studies/ records that were reviewed today include: Dr Corey Skains note, my prior office note, recent LU Doppler  Review of the above records today demonstrates: as above Prior radiographs: [place] R leg 09/08/2018 reveals R popliteal fossa vein   ASSESSMENT AND PLAN:  1.  Chronic R popliteal DVT We discussed at length today.  I worry that she has risks for recurrence given prior PTE and now R popliteal DVT.  I would agree with Dr Kendrick Fries that long term anticoagulation is warranted.  The patient and family are clear that they would not want to start xarelto 15mg  BID as previously ordered but would be willing to try xarelto 20mg  daily.  We discussed potential reduced efficacity when combined with phenytoin.  They are clear that coumadin is NOT an option. I will therefore start xarelto 20mg  daily today.  If not on phenytoin, I would lean to 10mg  daily eventually (Like in 3 months).  I am not sure and will defer to Dr Kendrick Fries in the setting of phenytoin.  2. Complete heart block Recent remote 10/17/2018 is reviewed and normal  3. HL Stable No change required today  4. OSA Uses Bipap  5. COVID 19 screen The patient denies symptoms of COVID 19 at this time.  The importance of social distancing was discussed today.  Follow-up:  telehealth visit with me in 3 months Continue remote monitoring  Current medicines are reviewed at length with the patient today.   The patient does not have concerns regarding her medicines.  The following changes were made today:  none  Labs/ tests ordered today include:  No orders of the  defined types were placed in this encounter.    Patient Risk:  after full review of this patients clinical status, I feel that they are at moderate risk at this time.  Today, I have spent 22 minutes with the patient with telehealth technology discussing DVTs .    Signed, Hillis Range MD, [facility] 10/19/2018 3:39 PM   [facility] [address] [place] [address] [phone] (office) [phone] (fax)

## 2018-10-25 ENCOUNTER — Encounter: Payer: Self-pay | Admitting: Cardiology

## 2018-10-25 NOTE — Progress Notes (Signed)
Remote pacemaker transmission.   

## 2018-10-26 ENCOUNTER — Ambulatory Visit: Payer: Medicare Other | Admitting: Neurology

## 2018-11-01 ENCOUNTER — Other Ambulatory Visit: Payer: Self-pay

## 2018-11-01 MED ORDER — POLYETHYLENE GLYCOL 3350 17 GM/SCOOP PO POWD: 17.0000 g | Freq: Three times a day (TID) | ORAL | 11 refills | Status: DC | PRN

## 2018-11-01 MED ORDER — BISACODYL 10 MG RE SUPP: 10.0000 mg | Freq: Every day | RECTAL | 11 refills | Status: DC | PRN

## 2018-11-01 MED ORDER — SENNA 8.6 MG PO TABS: 2.0000 | ORAL_TABLET | Freq: Every day | ORAL | 11 refills | Status: DC | PRN

## 2018-11-24 ENCOUNTER — Ambulatory Visit: Payer: Medicare Other | Admitting: Podiatry

## 2018-12-05 ENCOUNTER — Other Ambulatory Visit: Payer: Self-pay | Admitting: Neurology

## 2018-12-06 ENCOUNTER — Ambulatory Visit: Payer: Medicare Other | Admitting: Neurology

## 2018-12-07 ENCOUNTER — Other Ambulatory Visit: Payer: Self-pay

## 2018-12-07 ENCOUNTER — Telehealth (INDEPENDENT_AMBULATORY_CARE_PROVIDER_SITE_OTHER): Payer: Medicare Other | Admitting: Neurology

## 2018-12-07 ENCOUNTER — Ambulatory Visit: Payer: Medicare Other | Admitting: Primary Care

## 2018-12-07 VITALS — BP 113/63 | HR 61 | Temp 98.0°F

## 2018-12-07 DIAGNOSIS — G40211 Localization-related (focal) (partial) symptomatic epilepsy and epileptic syndromes with complex partial seizures, intractable, with status epilepticus: Secondary | ICD-10-CM | POA: Diagnosis not present

## 2018-12-07 MED ORDER — PHENYTOIN SODIUM EXTENDED 200 MG PO CAPS: 200.0000 mg | ORAL_CAPSULE | Freq: Two times a day (BID) | ORAL | 3 refills | Status: DC

## 2018-12-07 MED ORDER — LEVETIRACETAM ER 500 MG PO TB24: ORAL_TABLET | ORAL | 3 refills | Status: DC

## 2018-12-07 NOTE — Progress Notes (Signed)
Virtual Visit via Video Note The purpose of this virtual visit is to provide medical care while limiting exposure to the novel coronavirus.    Consent was obtained for video visit:  Yes.   Answered questions that patient had about telehealth interaction:  Yes.   I discussed the limitations, risks, security and privacy concerns of performing an evaluation and management service by telemedicine. I also discussed with the patient that there may be a patient responsible charge related to this service. The patient expressed understanding and agreed to proceed.  Pt location: Home Physician Location: office Name of referring provider:  Tracey HarriesBouska, David, MD I connected with Kaitlyn Powell at patient's POA initiation/request on 12/07/2018 at  2:00 PM EDT by video enabled telemedicine application and verified that I am speaking with the correct person using two identifiers. Pt MRN:  [id number] Pt DOB:  16-Aug-1955 Video Participants:  Kaitlyn Powell;  [name] (sister, POA)   History of Present Illness:  The patient was last seen in January 2020 for seizures. Her sister [name] is present during the e-visit and provides information. On her initial visit, family reported near daily episodes where she would briefly lose consciousness while sitting on the toilet, occurring on a near-daily basis, sometimes several times a day. Although episodes are suggestive of vasovagal syncope, she had a 72-hour EEG in November 2018 where a typical episode captured showed generalized frontally predominant 2 Hz spike and wave activity lasting 17 seconds with some after coming slow waves. Baseline EEG showed multifocal epileptiform discharges (independent bilateral temporal, generalized slow spike and wave), MRI brain no acute changes with congenital microcephaly. Episodes were occurring while on Dilantin 200mg  BID and Keppra 500mg  in AM, 1000mg  in PM. Low dose Vimpat was added in January, and it appeared there was some  decrease in episodes with a 3-4 day respite, however on 50mg  qhs dose, she became lethargic. Dose reduced back to 25mg  qhs. She continues to have frequent episodes, with only 9 days in April event-free. She has had 2 while sitting at the kitchen table, she was starting to talk then passed out very briefly, "like turning a toggle on and off." She had one on 5/1 and slept most of the day after. Her last episode was this afternoon. She is in good spirits today, awake and alert. [name] has noticed she is more forgetful, not knowing the days of the week anymore. She has switched to Xarelto for DVT.  History on Initial Assessment 09/02/2018: This is a pleasant 63 year old left-handed woman with a history of Down syndrome, sleep apnea, hypothyroidism, sick sinus syndrome s/p PPM, presenting for second opinion regarding seizures. [name] reports that Aubrina's seizures started in her teens/early 4620s, they would be walking and all of a sudden she would drop down. She would be awake and appear to lose all muscle tone, family would pick her up and she would be fine. She had been taking Dilantin for many years. [name] reports a period of time where she was not having seizures, or at least they were not hearing any news from her group home. In 2017, she became quite ill and was [place] in [place] for [place], CHF, and pulmonary embolism. She was on Lovenox until November 2017. There was concern for macrocytic anemia with normal B12 level possibly due to prolonged Dilantin use, she had been on Dilantin 200mg  BID. She had an infected PEG tube and had a GTC lasting 20 minutes. She was seen by neurologist Dr.  Terrace Arabia in 2018 due to drop seizures occurring on a daily basis with loss of consciousness lasting less than a minute. She was started on Levetiracetam, however with attempt to wean down Dilantin, she had several GTCs. She was restarted on Dilantin 200mg  BID and Keppra dose was reduced. She had an MRI brain without contrast in July  2018 which showed congenital microcephaly, moderate diffuse volum loss, moderate chronic microvascular disease. Routine EEG reported moderate to severe background slowing with rare generalized spike slow waves. The daily episodes decreased in frequency and would only occur when she was on the toilet. Initially they were occurring when she was having a BM and family could visibly see her abdomen straining, however recently they have occurred while peeing and without any visible straining. She would be out for 10-15 seconds, then wake up looking like she is trying to grab something or moving her hands L>R like she is scratching her leg. She would be talking gibberish or incoherent like baby talk when she starts coming around. A few times she would black out and go backward like someone pushed her back hard. She had a 3-day ambulatory video EEG study in November 2018. Background was diffusely slow with sharp and spike and wave discharges in the left frontotemporal region, bihemispheric, bisynchronous spike and wave discharges, isolated bursts of generalized spike and wave discharges, independent discharges in the right frontotemporal region. In general, there was a bifrontal predominance of discharges including the presence of occasional 2-2.5 slow spike and wave discharges. There was decreased amplitude over the right temporal area. There were 6 push button events, all due to "drop seizure on toilet." With 5 events, there was note of diffuse slow background with several bursts of bilateral rhythmic delta activity/intermittent delta activity. It was noted that event #6 had generalized frontally predominant 2 Hz spike and wave activity lasting 17 seconds with some after coming slow waves. Keppra dose was increased to 2500mg  daily at one point, then dose was reduced to 1000mg  qhs. She was tried on Lamotrigine but could not tolerate 25mg  dose, "looked like she was institutionalized." On 08/01/18, [name] could not wake her  up in the morning, she would open her eyes but was unresponsive. Kaitlyn sat her up and she started jerking for 10 minutes. They tried to stand her up after but they were dragging her like she was drunk and could not move her legs. She started waking up and family gave her medications, went back to sleep, then again had difficulty waking her up followed by upper body jerking when she was sat up. She was found to have a pneumonia. Keppra dose increased to 1000mg  BID in addition to Dilantin 200mg  BID. Family reports some days she would have 3 in one day, other times she would go 48 hours without an event. Last episode was yesterday. She is also on Risperdal 1mg  BID, family is unsure when or why this was prescribed, she is always happy and always positive.   Prior AEDs: Lamotrigine    Observations/Objective:   Vitals:   12/07/18 1355  BP: 113/63  Pulse: 61  Temp: 98 F (36.7 C)  SpO2: 97%   GEN:  The patient appears stated age and is in NAD. Down syndrome facies. Neurological examination: She is awake, alert, smiling and waving, saying short sentences. Able to follow simple commands. Cranial nerves: Extraocular movements intact with no nystagmus. No facial asymmetry. Motor: moves all extremities symmetrically, at least anti-gravity x 4. No incoordination on finger to  nose testing.   Assessment and Plan:   This is a pleasant 63 yo LH woman with a history of Down syndrome, sleep apnea, hypothyroidism, sick sinus syndrome s/p PPM, and seizures. She continues to have recurrent episodes of brief loss of consciousness that majority of the time occur while sitting on the toilet, although she has had 2 recently sitting on the kitchen table. Although episodes are suggestive of vasovagal syncope, she has had a 72-hour EEG in November 2018 reporting a typical episode associated with generalized frontally predominant 2 Hz spike and wave activity lasting 17 seconds with some after coming slow waves, suggestive of  epileptic seizure. Baseline EEG abnormal with multifocal epileptiform discharges (independent bilateral temporal, generalized slow spike and wave), MRI brain no acute changes with congenital microcephaly. There may have been slight decrease in frequency of spells with low dose Vimpat, but she had side effects on  daily dose. She will try alternating  and  every other day, hopefully she can tolerate increasing doses in the future. Continue Keppra  in AM,  in PM and Dilantin  BID. If episodes continue despite increasing doses of Vimpat and on 3 AEDs, repeat 72-hour EEG will be done. Follow-up in 3 months, family knows to call for any changes.    Follow Up Instructions:   -I discussed the assessment and treatment plan with the patient's sister. The patient's sister was provided an opportunity to ask questions and all were answered. The patient's sister agreed with the plan and demonstrated an understanding of the instructions.   The patient's sister was advised to call back or seek an in-person evaluation if the symptoms worsen or if the condition fails to improve as anticipated.   Van Clines, MD

## 2018-12-08 ENCOUNTER — Ambulatory Visit: Payer: Medicare Other | Admitting: Pulmonary Disease

## 2018-12-08 ENCOUNTER — Encounter: Payer: Self-pay | Admitting: Primary Care

## 2018-12-08 ENCOUNTER — Ambulatory Visit (INDEPENDENT_AMBULATORY_CARE_PROVIDER_SITE_OTHER): Payer: Medicare Other | Admitting: Primary Care

## 2018-12-08 DIAGNOSIS — J690 Pneumonitis due to inhalation of food and vomit: Secondary | ICD-10-CM | POA: Diagnosis not present

## 2018-12-08 DIAGNOSIS — R0600 Dyspnea, unspecified: Secondary | ICD-10-CM

## 2018-12-08 NOTE — Progress Notes (Signed)
Reviewed, agree 

## 2018-12-08 NOTE — Patient Instructions (Signed)
  Chronic right popliteal DVT: - Continue Xarelto 20mg  daily   OSA: - Continue BIPAP with 3L oxygen at night - Please bring in SD card to next appointment for review  Nocturnal dyspnea: - Check CXR  - BNP in February normal

## 2018-12-08 NOTE — Progress Notes (Signed)
Virtual Visit via Telephone Note  I connected with Kaitlyn Powell on 12/08/18 at 10:00 AM EDT by telephone and verified that I am speaking with the correct person using two identifiers. Patient's sister Kaitlyn Powell on phone call.   Location: Patient: Home Provider: Office   I discussed the limitations, risks, security and privacy concerns of performing an evaluation and management service by telephone and the availability of in person appointments. I also discussed with the patient that there may be a patient responsible charge related to this service. The patient expressed understanding and agreed to proceed.  History of Present Illness: 63 year old female, never smoker. PMH significant for aspiration pneumonia, OSA on BIPAP, PE in 2017, chronic right popliteal DVT. Patient of Dr. Kendrick Fries. Outside medical records obtained since last OV which showed no evidence of deep vein thrombosis in either extremity in 2017. Dr. Kendrick Fries continues to recommend treatment with anticoagulation for an acute DVT. Family was again unsure whether or not they wanted to start anticoagulation. Continues baby aspirin. Wanted to be referred to vascular specialist. Saw Dr. Johney Frame with cardiology on 3/18. He agreed that she needs long term anticoagulation for chronic right popliteal DVT. Agreeing to start patient on Xarelto 20mg  daily.   12/08/2018 Patient called today for Telephone visit. Doing well today, spoke with patients sister Kaitlyn Powell. Using BIPAP at night- no download available. They will needs to bring in SD card to next visit. Family has observed dry cough and occasional dyspnea at night when she lies down since previous pneumonia . Not an acute change. Otherwise she has been stable. Has some pedal edema on and off, usually on the left side where she had a prior ankle injury. Improves with leg elevation. Last BNP in February was normal. Following with cardiology, scheduled for televisit in June. Denies fevers, sick contacts or  flu-like illness.    Observations/Objective: - No shortness of breath, cough, wheezing  Assessment and Plan:  Chronic right popliteal DVT: - Continue Xarelto 20mg  daily (if not on phenytoin, consider 10mg  daily eventually)  OSA - Continue BIPAP with 3L oxygen at night - Needs to bring in SD card to next appointment for review  Nocturnal dyspnea - Check CXR  - BNP in February normal   Seizure disorder - Continue Keppra, Vimpat and Dilantin   Follow Up Instructions:   - Follow up in 3-4 months with Dr. Kendrick Fries please   I discussed the assessment and treatment plan with the patient. The patient was provided an opportunity to ask questions and all were answered. The patient agreed with the plan and demonstrated an understanding of the instructions.   The patient was advised to call back or seek an in-person evaluation if the symptoms worsen or if the condition fails to improve as anticipated.  I provided 22 minutes of non-face-to-face time during this encounter.   Glenford Bayley, NP

## 2018-12-14 ENCOUNTER — Telehealth: Payer: Self-pay

## 2018-12-15 NOTE — Telephone Encounter (Signed)
Spoke with pts sister regarding appt on 12/19/18. Pt sister was advise to check pts vitals prior to appt. All questions and concerns were address.

## 2018-12-16 ENCOUNTER — Telehealth: Payer: Self-pay | Admitting: Primary Care

## 2018-12-16 NOTE — Telephone Encounter (Signed)
Called Vascular and Vein to speak to [facility].  Connected to voicemail and left message that we attempted to call from [facility] triage re: referral for [employer].

## 2018-12-19 ENCOUNTER — Telehealth (INDEPENDENT_AMBULATORY_CARE_PROVIDER_SITE_OTHER): Payer: Medicare Other | Admitting: Internal Medicine

## 2018-12-19 ENCOUNTER — Encounter: Payer: Self-pay | Admitting: Internal Medicine

## 2018-12-19 ENCOUNTER — Ambulatory Visit (INDEPENDENT_AMBULATORY_CARE_PROVIDER_SITE_OTHER): Payer: Medicare Other

## 2018-12-19 VITALS — HR 70 | Ht 59.0 in | Wt 157.0 lb

## 2018-12-19 DIAGNOSIS — G4733 Obstructive sleep apnea (adult) (pediatric): Secondary | ICD-10-CM

## 2018-12-19 DIAGNOSIS — I442 Atrioventricular block, complete: Secondary | ICD-10-CM

## 2018-12-19 DIAGNOSIS — R0600 Dyspnea, unspecified: Secondary | ICD-10-CM | POA: Diagnosis not present

## 2018-12-19 DIAGNOSIS — R0602 Shortness of breath: Secondary | ICD-10-CM

## 2018-12-19 DIAGNOSIS — J690 Pneumonitis due to inhalation of food and vomit: Secondary | ICD-10-CM | POA: Diagnosis not present

## 2018-12-19 NOTE — Telephone Encounter (Signed)
Chanda with Vascular and Vein Specialist called back in regards to referral from Dr. Darrick Penna MD She advised they do not treat chronic DVT only trat occlusion DVT's She advised the Dr. Johney Frame MD in Cardiology is treating patient for DVT; no referral needed Nothing further needed regarding referral at this time.

## 2018-12-19 NOTE — Telephone Encounter (Signed)
Called and spoke with [facility] with Vascular and Vein Specialist at [phone] Got disconnected in the call; tried to call back and she was unavailable Asked for [facility] the nurse to call back to our office  Awaiting call back from [place].

## 2018-12-19 NOTE — Progress Notes (Signed)
Electrophysiology TeleHealth Note   Due to national recommendations of social distancing due to COVID 19, an audio/video telehealth visit is felt to be most appropriate for this patient at this time.  See MyChart message from today for the patient's consent to telehealth for [facility].   Date:  12/19/2018   ID:  Kaitlyn Powell, DOB 1956-03-12, MRN [id number]  Location: patient's home  Provider location: [address] [place]  Evaluation Performed: Follow-up visit  PCP:  Tracey Harries, MD   Electrophysiologist:  Dr Johney Frame  Chief Complaint:  SOB  History of Present Illness:    Kaitlyn Powell is a 63 y.o. female who presents via audio/video conferencing for a telehealth visit today.  Since last virtual visit, the patient reports doing reasonably well.  She is more sleeping during the day.  She has also developed progressive exertional SOB.  She does not have worsening edema.  Orthopnea is stable. Today, she denies symptoms of palpitations, chest pain,dizziness, presyncope, or syncope.  The patient is otherwise without complaint today.  The patient denies symptoms of fevers, chills, cough, or new SOB worrisome for COVID 19.  Past Medical History:  Diagnosis Date  . Arthritis   . Aspiration pneumonia (HCC)   . Asthma   . Cardiac arrhythmia    have pacemeaker  . Down syndrome   . DVT of popliteal vein (HCC) 09/08/2018   chronic  . GERD (gastroesophageal reflux disease)   . Heart block   . Hypothyroidism   . Mental retardation   . Pacemaker   . Pulmonary emboli (HCC)   . Seizures (HCC)   . Sleep apnea with use of continuous positive airway pressure (CPAP)     Past Surgical History:  Procedure Laterality Date  . PACEMAKER INSERTION  12/09/2015   [employer] Accolade MRI EL PPM implanted in PA for complete heart block and syncope  . PEG TUBE PLACEMENT     removed in 2017  . TIBIA FRACTURE SURGERY Right     Current Outpatient Medications   Medication Sig Dispense Refill  . citalopram (CELEXA) 20 MG tablet Take 20 mg by mouth daily.    . cyanocobalamin (TH VITAMIN B12) 100 MCG tablet Take 100 mcg by mouth every morning.     . folic acid (FOLVITE) 1 MG tablet Take 1 mg  by mouth  daily    . lacosamide (VIMPAT) 50 MG TABS tablet Take 25 mg by mouth 2 (two) times daily.    Marland Kitchen levETIRAcetam (KEPPRA XR) 500 MG 24 hr tablet Take 1 tab in AM, 2 tabs at bedtime 270 tablet 3  . levothyroxine (SYNTHROID, LEVOTHROID) 150 MCG tablet Take 150 mcg by mouth daily before breakfast.    . Melatonin 5 MG TABS Take 1 tablet by mouth at bedtime.    . OXYGEN Inhale 3 L into the lungs as directed. With CPAP     . phenytoin (DILANTIN) 200 MG ER capsule Take 1 capsule (200 mg total) by mouth 2 (two) times daily. 180 capsule 3  . polyethylene glycol powder (MIRALAX) powder Take 17 g by mouth 3 (three) times daily as needed for mild constipation. 255 g 11  . risperiDONE (RISPERDAL) 1 MG tablet I tablet BID    . rivaroxaban (XARELTO) 20 MG TABS tablet Take 1 tablet (20 mg total) by mouth daily with supper. 30 tablet 3  . simvastatin (ZOCOR) 40 MG tablet Take 40 mg  by mouth daily at bedtime 90 tablet  2   No current facility-administered medications for this visit.     Allergies:   Cephalexin and Namenda [memantine hcl]   Social History:  The patient  reports that she has never smoked. She has never used smokeless tobacco. She reports that she does not drink alcohol or use drugs.   Family History:  The patient's  family history includes Breast cancer in her sister; Diabetes in her father; Heart disease in her father.   ROS:  Please see the history of present illness.   All other systems are personally reviewed and negative.    Exam:    Vital Signs:  Pulse 70   Ht 4\' 11"  (1.499 m)   Wt 157 lb (71.2 kg)   SpO2 95%   BMI 31.71 kg/m   Sleepy appearing, her sister provides most of the history, regular work of breathing,  good skin color Eyes-  anicteric, neuro- grossly intact, skin- no apparent rash or lesions or cyanosis, mouth- oral mucosa is pink   Labs/Other Tests and Data Reviewed:    Recent Labs: 08/01/2018: ALT 15 08/02/2018: TSH 0.418 09/21/2018: BUN 17; Creatinine, Ser 0.74; Hemoglobin 12.0; Platelets 96.0; Potassium 4.4; Pro B Natriuretic peptide (BNP) 86.0; Sodium 130   Wt Readings from Last 3 Encounters:  12/19/18 157 lb (71.2 kg)  12/06/18 157 lb 3.2 oz (71.3 kg)  09/28/18 150 lb 9.6 oz (68.3 kg)     Other studies personally reviewed: Additional studies/ records that were reviewed today include: my prior notes, pulmonary clinic notes  Review of the above records today demonstrates: as above Prior radiographs: CXR from today is personally reviewed.  She has prominent main pulmonary arteries however this appears stable   Last device remote is reviewed from PaceART PDF dated 10/17/2018 which reveals normal device function, no arrhythmias ,  She V paces < 1%   ASSESSMENT & PLAN:    1.  SOB  Likely mutifactoral Several pulmonary causes to considered She is following closely with pulmonary team Echo 2019 revealed normal EF.  Pacemaker function is normal.  No overt CHF on CXR. I would not advise further CV testing currently Could consider further cardiac risk stratification if does not improve  Low sodium diet encouraged She has gained 7 lbs.  Family does not feel that she is retaining fluid.  Weight loss is advised.  Consider gentle diuresis if needed  2. OSA Uses bipap  3. Complete heart block Normal device remote 10/17/2018 reviewed She does not frequently V pace  4. H/o DVT On xarelto Given that she is also on phenytoin, I am reluctant to reduce to 10mg  daily.  I will defer to pulmonary team.  5. COVID 19 screen The patient denies symptoms of COVID 19 at this time.  The importance of social distancing was discussed today.  Follow-up:  3  Months with EP PA Next remote: 01/2019  Current medicines  are reviewed at length with the patient today.   The patient does not have concerns regarding her medicines.  The following changes were made today:  none  Labs/ tests ordered today include:  No orders of the defined types were placed in this encounter.   Patient Risk:  after full review of this patients clinical status, I feel that they are at moderate risk at this time.  Today, I have spent 20 minutes with the patient with telehealth technology discussing SOB .    Signed, Hillis RangeJames Semiyah Newgent, MD  12/19/2018 11:52 AM     [facility]  [address] [place] [address] [phone] (office) [phone] (fax)

## 2018-12-19 NOTE — Progress Notes (Signed)
Typically it means scarring from previous infections. Good thing is that it is stable and not worsened which is reassuring. I will ask Dr. Kendrick Fries if he wants a better picture such as CAT scan to evaluate further or not.

## 2018-12-21 ENCOUNTER — Other Ambulatory Visit: Payer: Self-pay

## 2018-12-21 ENCOUNTER — Telehealth: Payer: Self-pay

## 2018-12-21 DIAGNOSIS — R0602 Shortness of breath: Secondary | ICD-10-CM

## 2018-12-21 NOTE — Telephone Encounter (Signed)
Spoke with Kaitlyn Powell (DPR). Relayed Dr Ulyses Jarred interpretation of chest xray: Could be anything, but most likely chronic aspiration.  HRCT likely wouldn't change management  If they want it can order. Both Kaitlyn Powell and Kaitlyn Powell wanted to proceed with HRCT because they stated pt was not "acting right".  Pt gets SOB and sats drop with ambulation to bathroom. Order placed.  Informed them it may be a little bit of time before HRCT due to Covid 19 backlog.  Nothing further is needed at this time.

## 2018-12-22 ENCOUNTER — Telehealth: Payer: Self-pay | Admitting: Internal Medicine

## 2018-12-22 NOTE — Telephone Encounter (Signed)
I spoke with pt's sister regarding my chart message. They feel pt is more short of breath than when she had visit with Dr. Johney Frame earlier this week.  Dr. Kendrick Fries has ordered a CT scan to evaluate shortness of breath and this is scheduled for tomorrow. Family noticed pt's heart rate when up to 120 today with ambulation.  This is much higher than usual.  They are wondering if this could be contributing to shortness of breath. Rate went to 80's with rest but usually heart rate is 60-70.  Pt does have a pacemaker. Will forward to Dr.Allred for review/recommendations.

## 2018-12-22 NOTE — Telephone Encounter (Signed)
See my chart message from today for documentation regarding this call.

## 2018-12-22 NOTE — Telephone Encounter (Signed)
Ms Kaitlyn Powell is returning a call from [employer]

## 2018-12-22 NOTE — Telephone Encounter (Signed)
Called patient regarding her MyChart message to get more information. Patient did not answer the phone, left message for the patient to call the office back.

## 2018-12-23 ENCOUNTER — Other Ambulatory Visit: Payer: Self-pay

## 2018-12-23 ENCOUNTER — Ambulatory Visit (HOSPITAL_COMMUNITY)
Admission: RE | Admit: 2018-12-23 | Discharge: 2018-12-23 | Disposition: A | Discharge status: Home / Self Care | Payer: Medicare Other | Source: Ambulatory Visit | Attending: Primary Care | Admitting: Primary Care

## 2018-12-23 DIAGNOSIS — R0602 Shortness of breath: Secondary | ICD-10-CM | POA: Insufficient documentation

## 2018-12-23 NOTE — Telephone Encounter (Deleted)
Sent the pt a mychart message informing her that per Dr Delton See, she wants her to continue holding her coreg, based on her numbers she provided [place].

## 2018-12-23 NOTE — Progress Notes (Unsigned)
Order received from Dr. Johney Frame to refer to Kaitlyn Powell for cardiac work up for sob and increased heart rate.

## 2018-12-26 NOTE — Progress Notes (Unsigned)
I have an 11:15 appt on Wed. I also have 4 open slots on Fri. I held the slot on Wed if she wants that one. Edson Snowball, can you call Ms. Kushnir and see if she wants to be seen Wed or Fri? Tereso Newcomer, PA-C    12/26/2018 9:02 PM

## 2018-12-27 ENCOUNTER — Telehealth: Payer: Self-pay | Admitting: *Deleted

## 2018-12-27 NOTE — Telephone Encounter (Signed)
SPOKE WITH PT SISTER TO VERIFY AND RE- SCHEDULE APPT IN OFFICE WITH VIN 5-27 FROM  [facility]  VIRTUAL VISIT

## 2018-12-28 ENCOUNTER — Ambulatory Visit (INDEPENDENT_AMBULATORY_CARE_PROVIDER_SITE_OTHER): Payer: Medicare Other | Admitting: Physician Assistant

## 2018-12-28 ENCOUNTER — Telehealth: Payer: Self-pay | Admitting: Physician Assistant

## 2018-12-28 ENCOUNTER — Ambulatory Visit: Payer: Medicare Other | Admitting: Physician Assistant

## 2018-12-28 ENCOUNTER — Encounter: Payer: Self-pay | Admitting: Physician Assistant

## 2018-12-28 ENCOUNTER — Other Ambulatory Visit: Payer: Self-pay

## 2018-12-28 VITALS — BP 122/58 | HR 61 | Ht 59.0 in | Wt 161.8 lb

## 2018-12-28 DIAGNOSIS — R0602 Shortness of breath: Secondary | ICD-10-CM | POA: Diagnosis not present

## 2018-12-28 DIAGNOSIS — I442 Atrioventricular block, complete: Secondary | ICD-10-CM | POA: Diagnosis not present

## 2018-12-28 DIAGNOSIS — R079 Chest pain, unspecified: Secondary | ICD-10-CM

## 2018-12-28 DIAGNOSIS — I2584 Coronary atherosclerosis due to calcified coronary lesion: Secondary | ICD-10-CM

## 2018-12-28 DIAGNOSIS — I2510 Atherosclerotic heart disease of native coronary artery without angina pectoris: Secondary | ICD-10-CM | POA: Diagnosis not present

## 2018-12-28 DIAGNOSIS — Z950 Presence of cardiac pacemaker: Secondary | ICD-10-CM

## 2018-12-28 DIAGNOSIS — R0789 Other chest pain: Secondary | ICD-10-CM

## 2018-12-28 MED ORDER — FUROSEMIDE 20 MG PO TABS: 20.0000 mg | ORAL_TABLET | ORAL | 11 refills | Status: DC | PRN

## 2018-12-28 MED ORDER — METOPROLOL TARTRATE 100 MG PO TABS: 100.0000 mg | ORAL_TABLET | Freq: Once | ORAL | 0 refills | Status: DC

## 2018-12-28 MED ORDER — NITROGLYCERIN 0.4 MG SL SUBL: 0.4000 mg | SUBLINGUAL_TABLET | SUBLINGUAL | 5 refills | Status: DC | PRN

## 2018-12-28 NOTE — Patient Instructions (Addendum)
Medication Instructions:  Your physician has recommended you make the following change in your medication:  1. START NITROGLYCERIN 0.4 MG AS NEEDED FOR CHEST PAIN.  2. START LASIX 10 MG AS NEEDED FOR SWELLING.  If you need a refill on your cardiac medications before your next appointment, please call your pharmacy.   Lab work: TODAY: BMET If you have labs (blood work) drawn today and your tests are completely normal, you will receive your results only by: Marland Kitchen MyChart Message (if you have MyChart) OR . A paper copy in the mail If you have any lab test that is abnormal or we need to change your treatment, we will call you to review the results.  Testing/Procedures: Non-Cardiac CT Angiography (CTA), is a special type of CT scan that uses a computer to produce multi-dimensional views of major blood vessels throughout the body. In CT angiography, a contrast material is injected through an IV to help visualize the blood vessels   Follow-Up: Your physician recommends that you schedule a follow-up appointment WITH DR. ALLRED AS PLANNED   Please arrive at the [facility] main entrance of [facility] at xx:xx AM (30-45 minutes prior to test start time)  [facility] [address] [place], [place] [address] [phone]  Proceed to the [facility] Radiology Department (First Floor).  Please follow these instructions carefully (unless otherwise directed):  Hold all erectile dysfunction medications at least 48 hours prior to test.  On the Night Before the Test: . Be sure to Drink plenty of water. . Do not consume any caffeinated/decaffeinated beverages or chocolate 12 hours prior to your test. . Do not take any antihistamines 12 hours prior to your test. . If you take Metformin do not take 24 hours prior to test. . If the patient has contrast allergy: ? Patient will need a prescription for Prednisone and very clear instructions (as follows): 1. Prednisone 50 mg - take  13 hours prior to test 2. Take another Prednisone 50 mg 7 hours prior to test 3. Take another Prednisone 50 mg 1 hour prior to test 4. Take Benadryl 50 mg 1 hour prior to test . Patient must complete all four doses of above prophylactic medications. . Patient will need a ride after test due to Benadryl.  On the Day of the Test: . Drink plenty of water. Do not drink any water within one hour of the test. . Do not eat any food 4 hours prior to the test. . You may take your regular medications prior to the test.  . Take metoprolol (Lopressor) two hours prior to test. . HOLD Furosemide/Hydrochlorothiazide morning of the test.  After the Test: . Drink plenty of water. . After receiving IV contrast, you may experience a mild flushed feeling. This is normal. . On occasion, you may experience a mild rash up to 24 hours after the test. This is not dangerous. If this occurs, you can take Benadryl 25 mg and increase your fluid intake. . If you experience trouble breathing, this can be serious. If it is severe call 911 IMMEDIATELY. If it is mild, please call our office. . If you take any of these medications: Glipizide/Metformin, Avandament, Glucavance, please do not take 48 hours after completing test.

## 2018-12-28 NOTE — Telephone Encounter (Signed)
New Message          Patient is needing a CT scheduled right away, pls call to schedule. I this one belongs to [facility]

## 2018-12-28 NOTE — Progress Notes (Signed)
Cardiology Office Note    Date:  12/28/2018   ID:  Kaitlyn, Powell 10/25/1955, MRN [id number]  PCP:  Tracey Harries, MD  Cardiologist/Electrophysiologist: Dr. Johney Frame  Chief Complaint: SOB and elevated HR  History of Present Illness:   Kaitlyn Powell is a 63 y.o. female CHB s/p pacemaker, OSA on BiPAP, Down's syndrome, hx of DVT on Xarelto and seizure disorder seen for dyspnea.   Normal device remote 10/17/2018. Last seen by Dr. Johney Frame 12/19/2018. Her dyspnea felt multifactorial with mostly pulmonary in etiology. No further cardiac testing recommended.   Recent chest X-ray 12/19/18 showed stable chronic changes. No active disease.  Chest CT 12/23/18 showed no definite findings to suggest interstitial lung disease. Aortic atherosclerosis, in addition to left main and left anterior descending coronary artery disease.   Added to my schedule for further evaluation. She is lethargic today due to seizure this morning 8am. Recently seen by neurologist and placed on Vimpat. For the past few weeks she has dyspnea intermittently. Hx obtained from caregiver. Today she complained to chest pain. Unable to described character or give further information. Intermittent ankle edema. No orthopnea. Compliant with BiPAP. Usually walks few 10s of step with help of caregiver.   Past Medical History:  Diagnosis Date  . Arthritis   . Aspiration pneumonia (HCC)   . Asthma   . Cardiac arrhythmia    have pacemeaker  . Down syndrome   . DVT of popliteal vein (HCC) 09/08/2018   chronic  . GERD (gastroesophageal reflux disease)   . Heart block   . Hypothyroidism   . Mental retardation   . Pacemaker   . Pulmonary emboli (HCC)   . Seizures (HCC)   . Sleep apnea with use of continuous positive airway pressure (CPAP)     Past Surgical History:  Procedure Laterality Date  . PACEMAKER INSERTION  12/09/2015   [employer] Accolade MRI EL PPM implanted in PA for complete heart block and syncope  .  PEG TUBE PLACEMENT     removed in 2017  . TIBIA FRACTURE SURGERY Right     Current Medications: Prior to Admission medications   Medication Sig Start Date End Date Taking? Authorizing Provider  citalopram (CELEXA) 20 MG tablet Take 20 mg by mouth daily.    [provider]  cyanocobalamin (TH VITAMIN B12) 100 MCG tablet Take 100 mcg by mouth every morning.     [provider]  folic acid (FOLVITE) 1 MG tablet Take 1 mg  by mouth  daily    [provider]  lacosamide (VIMPAT) 50 MG TABS tablet Take 25 mg by mouth 2 (two) times daily.    [provider]  levETIRAcetam (KEPPRA XR) 500 MG 24 hr tablet Take 1 tab in AM, 2 tabs at bedtime 12/07/18   Van Clines, MD  levothyroxine (SYNTHROID, LEVOTHROID) 150 MCG tablet Take 150 mcg by mouth daily before breakfast.    [provider]  Melatonin 5 MG TABS Take 1 tablet by mouth at bedtime.    [provider]  OXYGEN Inhale 3 L into the lungs as directed. With CPAP     [provider]  phenytoin (DILANTIN) 200 MG ER capsule Take 1 capsule (200 mg total) by mouth 2 (two) times daily. 12/07/18   Van Clines, MD  polyethylene glycol powder [facility]) powder Take 17 g by mouth 3 (three) times daily as needed for mild constipation. 11/01/18   Napoleon Form, MD  risperiDONE (  RISPERDAL) 1 MG tablet I tablet BID    [provider]  rivaroxaban (XARELTO) 20 MG TABS tablet Take 1 tablet (20 mg total) by mouth daily with supper. 10/19/18   Marily Lente, NP  simvastatin (ZOCOR) 40 MG tablet Take 40 mg  by mouth daily at bedtime 04/22/18   Hillis Range, MD    Allergies:   Cephalexin and Namenda [memantine hcl]   Social History   Socioeconomic History  . Marital status: Single    Spouse name: Not on file  . Number of children: Not on file  . Years of education: Not on file  . Highest education level: Not on file  Occupational History  . Occupation: disabled  Social Needs  .  Financial resource strain: Not on file  . Food insecurity:    Worry: Not on file    Inability: Not on file  . Transportation needs:    Medical: Not on file    Non-medical: Not on file  Tobacco Use  . Smoking status: Never Smoker  . Smokeless tobacco: Never Used  Substance and Sexual Activity  . Alcohol use: No  . Drug use: No  . Sexual activity: Not on file  Lifestyle  . Physical activity:    Days per week: Not on file    Minutes per session: Not on file  . Stress: Not on file  Relationships  . Social connections:    Talks on phone: Not on file    Gets together: Not on file    Attends religious service: Not on file    Active member of club or organization: Not on file    Attends meetings of clubs or organizations: Not on file    Relationship status: Not on file  Other Topics Concern  . Not on file  Social History Narrative   Pt is left handed   Lives in 2 story home with her sister, brother-in-law and BIL's brother (pt stays on bottom floor)     Family History:  The patient's family history includes Breast cancer in her sister; Diabetes in her father; Heart disease in her father.   ROS:   Please see the history of present illness.    ROS All other systems reviewed and are negative.   PHYSICAL EXAM:   VS:  BP (!) 122/58   Pulse 61   Ht  (1.499 m)   Wt 161 lb 12.8 oz (73.4 kg)   SpO2 96%   BMI 32.68 kg/m    GEN: Lethargic female in no acute distress  HEENT: normal  Neck: no JVD, carotid bruits, or masses Cardiac: RRR; no murmurs, rubs, or gallops,no edema  Respiratory:  clear to auscultation bilaterally, normal work of breathing GI: soft, nontender, nondistended, + BS MS: no deformity or atrophy  Skin: warm and dry, no rash Neuro:  Alert and Oriented x 3, lethergic  Psych:deawsy   Wt Readings from Last 3 Encounters:  12/28/18 161 lb 12.8 oz (73.4 kg)  12/19/18 157 lb (71.2 kg)  12/06/18 157 lb 3.2 oz (71.3 kg)      Studies/Labs Reviewed:   EKG:   EKG is ordered today.  The ekg ordered today demonstrates NSR at rate of 61 bpm  Recent Labs: 08/01/2018: ALT 15 08/02/2018: TSH 0.418 09/21/2018: BUN 17; Creatinine, Ser 0.74; Hemoglobin 12.0; Platelets 96.0; Potassium 4.4; Pro B Natriuretic peptide (BNP) 86.0; Sodium 130   Lipid Panel    Component Value Date/Time   CHOL 209 (H) 06/15/2018 1211  TRIG 60 06/15/2018 1211   HDL 106 06/15/2018 1211   CHOLHDL 2.0 06/15/2018 1211   LDLCALC 91 06/15/2018 1211    Additional studies/ records that were reviewed today include:   Echocardiogram:01/2018 Study Conclusions  - Left ventricle: The cavity size was normal. Wall thickness was   normal. Systolic function was normal. The estimated ejection   fraction was in the range of 60% to 65%. Left ventricular   diastolic function parameters were normal for the patient&'s age. - Aortic valve: There was trivial regurgitation. - Mitral valve: There was mild regurgitation. - Pulmonary arteries: Systolic pressure was mildly increased. PA   peak pressure: 38 mm Hg (S).   ASSESSMENT & PLAN:    1. Chest pain and dyspnea - Her symptoms is vague and hard to determine given lethargy due to seizure this morning. EKG without acute ischemic changes. She does not have orthopnea or PND. Intermittent edema. Her dyspnea could be due to deconditioning. Seems atypical chest pain>> sometime point to chest and other times to belly. No reproducible with palpations. Continue statin. No ASA as she is no anticoagulant. Will get Coronary CT given CAD by CT of chest. Trial of PRN nitro and lasix.   2. Abnormal CT chest - No definite findings to suggest interstitial lung disease. However, showed aortic atherosclerosis and left main and left anterior descending coronary artery disease. As above.   3. SSS s/p pacemaker   Medication Adjustments/Labs and Tests Ordered: Current medicines are reviewed at length with the patient today.  Concerns regarding medicines are  outlined above.  Medication changes, Labs and Tests ordered today are listed in the Patient Instructions below. Patient Instructions  Medication Instructions:  Your physician has recommended you make the following change in your medication:  1. START NITROGLYCERIN 0.4 MG AS NEEDED FOR CHEST PAIN.  2. START LASIX 10 MG AS NEEDED FOR SWELLING.  If you need a refill on your cardiac medications before your next appointment, please call your pharmacy.   Lab work: TODAY: BMET If you have labs (blood work) drawn today and your tests are completely normal, you will receive your results only by: Marland Kitchen. MyChart Message (if you have MyChart) OR . A paper copy in the mail If you have any lab test that is abnormal or we need to change your treatment, we will call you to review the results.  Testing/Procedures: Non-Cardiac CT Angiography (CTA), is a special type of CT scan that uses a computer to produce multi-dimensional views of major blood vessels throughout the body. In CT angiography, a contrast material is injected through an IV to help visualize the blood vessels   Follow-Up: Your physician recommends that you schedule a follow-up appointment WITH DR. ALLRED AS PLANNED   Please arrive at the [facility] main entrance of [facility] at xx:xx AM (30-45 minutes prior to test start time)  [facility] [address] [place], [place] [address] [phone]  Proceed to the [facility] Radiology Department (First Floor).  Please follow these instructions carefully (unless otherwise directed):  Hold all erectile dysfunction medications at least 48 hours prior to test.  On the Night Before the Test: . Be sure to Drink plenty of water. . Do not consume any caffeinated/decaffeinated beverages or chocolate 12 hours prior to your test. . Do not take any antihistamines 12 hours prior to your test. . If you take Metformin do not take 24 hours prior to test. . If the patient has  contrast  allergy: ? Patient will need a prescription for Prednisone and very clear instructions (as follows): 1. Prednisone 50 mg - take 13 hours prior to test 2. Take another Prednisone 50 mg 7 hours prior to test 3. Take another Prednisone 50 mg 1 hour prior to test 4. Take Benadryl 50 mg 1 hour prior to test . Patient must complete all four doses of above prophylactic medications. . Patient will need a ride after test due to Benadryl.  On the Day of the Test: . Drink plenty of water. Do not drink any water within one hour of the test. . Do not eat any food 4 hours prior to the test. . You may take your regular medications prior to the test.  . Take metoprolol (Lopressor) two hours prior to test. . HOLD Furosemide/Hydrochlorothiazide morning of the test.  After the Test: . Drink plenty of water. . After receiving IV contrast, you may experience a mild flushed feeling. This is normal. . On occasion, you may experience a mild rash up to 24 hours after the test. This is not dangerous. If this occurs, you can take Benadryl 25 mg and increase your fluid intake. . If you experience trouble breathing, this can be serious. If it is severe call 911 IMMEDIATELY. If it is mild, please call our office. . If you take any of these medications: Glipizide/Metformin, Avandament, Glucavance, please do not take 48 hours after completing test.     Lorelei Pont, PA  12/28/2018 1:33 PM    [facility] [address] [place], [place], [place]  [address] Phone: [phone]; Fax: [phone]

## 2018-12-29 NOTE — Addendum Note (Signed)
Addended by: Daleen Bo I on: 12/29/2018 11:25 AM   Modules accepted: Orders

## 2018-12-30 LAB — BASIC METABOLIC PANEL
BUN/Creatinine Ratio: 23 (ref 12–28)
BUN: 15 mg/dL (ref 8–27)
CO2: 22 mmol/L (ref 20–29)
Calcium: 9.1 mg/dL (ref 8.7–10.3)
Chloride: 92 mmol/L — ABNORMAL LOW (ref 96–106)
Creatinine, Ser: 0.66 mg/dL (ref 0.57–1.00)
GFR calc Af Amer: 109 mL/min/{1.73_m2} (ref >59)
GFR calc non Af Amer: 95 mL/min/{1.73_m2} (ref >59)
Glucose: 97 mg/dL (ref 65–99)
Potassium: 4.9 mmol/L (ref 3.5–5.2)
Sodium: 129 mmol/L — ABNORMAL LOW (ref 134–144)

## 2019-01-02 ENCOUNTER — Telehealth (HOSPITAL_COMMUNITY): Payer: Self-pay | Admitting: Emergency Medicine

## 2019-01-02 NOTE — Telephone Encounter (Signed)
Reaching out to patient to offer assistance regarding upcoming cardiac imaging study; pt verbalizes understanding of appt date/time, parking situation and where to check in, pre-test NPO status and medications ordered, and verified current allergies; name and call back number provided for further questions should they arise Rockwell [place] RN Navigator Cardiac Imaging [facility] [phone] office [phone] cell  Pt has POA who states patient is not having symptoms of covid, will be attending appointment with patient since patient needs guardian

## 2019-01-03 ENCOUNTER — Ambulatory Visit (HOSPITAL_COMMUNITY): Payer: Medicare Other

## 2019-01-03 ENCOUNTER — Other Ambulatory Visit: Payer: Self-pay

## 2019-01-03 ENCOUNTER — Ambulatory Visit (HOSPITAL_COMMUNITY)
Admission: RE | Admit: 2019-01-03 | Discharge: 2019-01-03 | Disposition: A | Discharge status: Home / Self Care | Payer: Medicare Other | Source: Ambulatory Visit | Attending: Physician Assistant | Admitting: Physician Assistant

## 2019-01-03 DIAGNOSIS — I2510 Atherosclerotic heart disease of native coronary artery without angina pectoris: Secondary | ICD-10-CM | POA: Diagnosis not present

## 2019-01-03 DIAGNOSIS — I2584 Coronary atherosclerosis due to calcified coronary lesion: Secondary | ICD-10-CM | POA: Diagnosis present

## 2019-01-03 MED ORDER — IOHEXOL 350 MG/ML SOLN
80.0000 mL | Freq: Once | INTRAVENOUS | Status: AC | PRN
  Administered 2019-01-03: 80 mL via INTRAVENOUS

## 2019-01-03 MED ORDER — NITROGLYCERIN 0.4 MG SL SUBL
0.8000 mg | SUBLINGUAL_TABLET | Freq: Once | SUBLINGUAL | Status: AC
  Administered 2019-01-03: 0.8 mg via SUBLINGUAL

## 2019-01-03 MED ORDER — NITROGLYCERIN 0.4 MG SL SUBL
SUBLINGUAL_TABLET | SUBLINGUAL | Status: AC
  Filled 2019-01-03: qty 1

## 2019-01-04 MED ORDER — ASPIRIN EC 81 MG PO TBEC: 81.0000 mg | DELAYED_RELEASE_TABLET | Freq: Every day | ORAL | 3 refills | Status: DC

## 2019-01-10 ENCOUNTER — Other Ambulatory Visit: Payer: Self-pay | Admitting: Nurse Practitioner

## 2019-01-10 NOTE — Telephone Encounter (Signed)
Age 63, weight 73.4kg, SCr 0.66 on 12/28/18, CrCl 150mL/min. Pt on Xarelto for recurrent VTE. Phenytoin contraindicated with Xarelto due to increased clearance of Xarelto and subtherapeutic concentrations and anticoagulation. Per Dr Jackalyn Lombard note in March 2020:  "The patient and family are clear that they would not want to start xarelto 15mg  BID as previously ordered but would be willing to try xarelto 20mg  daily.  We discussed potential reduced efficacity when combined with phenytoin.  They are clear that coumadin is NOT an option. I will therefore start xarelto 20mg  daily today.  If not on phenytoin, I would lean to 10mg  daily eventually (Like in 3 months).  I am not sure and will defer to Dr Lake Bells in the setting of phenytoin."  Will send in refill.

## 2019-01-13 ENCOUNTER — Other Ambulatory Visit: Payer: Self-pay

## 2019-01-13 ENCOUNTER — Telehealth: Payer: Self-pay | Admitting: Internal Medicine

## 2019-01-13 DIAGNOSIS — R0602 Shortness of breath: Secondary | ICD-10-CM

## 2019-01-13 NOTE — Progress Notes (Signed)
Order entered for ECHO per Dr. Rayann Heman.

## 2019-01-13 NOTE — Telephone Encounter (Signed)
follow up: ° ° ° °Patient returning your call back. °

## 2019-01-16 ENCOUNTER — Ambulatory Visit (INDEPENDENT_AMBULATORY_CARE_PROVIDER_SITE_OTHER): Payer: Medicare Other | Admitting: *Deleted

## 2019-01-16 DIAGNOSIS — I442 Atrioventricular block, complete: Secondary | ICD-10-CM

## 2019-01-16 LAB — CUP PACEART REMOTE DEVICE CHECK
Battery Remaining Longevity: 144 mo
Battery Remaining Percentage: 100 %
Brady Statistic RA Percent Paced: 12 %
Brady Statistic RV Percent Paced: 2 %
Date Time Interrogation Session: 20200615063100
Implantable Lead Implant Date: 20170508
Implantable Lead Implant Date: 20170508
Implantable Lead Location: 753859
Implantable Lead Location: 753860
Implantable Lead Model: 7740
Implantable Lead Model: 7741
Implantable Lead Serial Number: 1111
Implantable Lead Serial Number: 1111
Implantable Pulse Generator Implant Date: 20170508
Lead Channel Impedance Value: 628 Ohm
Lead Channel Impedance Value: 671 Ohm
Lead Channel Pacing Threshold Amplitude: 0.8 V
Lead Channel Pacing Threshold Pulse Width: 0.4 ms
Lead Channel Setting Pacing Amplitude: 2 V
Lead Channel Setting Pacing Amplitude: 5 V
Lead Channel Setting Pacing Pulse Width: 1 ms
Lead Channel Setting Sensing Sensitivity: 2.5 mV
Pulse Gen Serial Number: 747619

## 2019-01-17 ENCOUNTER — Telehealth (HOSPITAL_COMMUNITY): Payer: Self-pay | Admitting: Cardiology

## 2019-01-17 NOTE — Telephone Encounter (Signed)
Attempted covid screen call. No answer, voice on message was "[name]", did not leave message, DPR does not state who's cell number to leave message with, will try again later.

## 2019-01-18 ENCOUNTER — Other Ambulatory Visit: Payer: Self-pay

## 2019-01-18 ENCOUNTER — Ambulatory Visit (HOSPITAL_COMMUNITY): Payer: Medicare Other | Attending: Cardiology

## 2019-01-18 ENCOUNTER — Telehealth: Payer: Self-pay

## 2019-01-18 DIAGNOSIS — R0602 Shortness of breath: Secondary | ICD-10-CM | POA: Insufficient documentation

## 2019-01-18 NOTE — Telephone Encounter (Signed)
F/U Message             Patient is returning April's call, would like a call back.

## 2019-01-18 NOTE — Telephone Encounter (Signed)
Spoke with pts sister regarding covid-19 screening prior to appt. Pts sister stated neither her or the pt has been in contact with anyone who may have covid-19 and has no symptoms.

## 2019-01-18 NOTE — Telephone Encounter (Signed)
Left message regarding appt on 01/19/19. 

## 2019-01-19 ENCOUNTER — Encounter: Payer: Self-pay | Admitting: Internal Medicine

## 2019-01-19 ENCOUNTER — Ambulatory Visit (INDEPENDENT_AMBULATORY_CARE_PROVIDER_SITE_OTHER): Payer: Medicare Other | Admitting: Internal Medicine

## 2019-01-19 VITALS — BP 108/64 | HR 72 | Ht 59.0 in | Wt 159.8 lb

## 2019-01-19 DIAGNOSIS — G4733 Obstructive sleep apnea (adult) (pediatric): Secondary | ICD-10-CM | POA: Diagnosis not present

## 2019-01-19 DIAGNOSIS — R0602 Shortness of breath: Secondary | ICD-10-CM

## 2019-01-19 DIAGNOSIS — I2510 Atherosclerotic heart disease of native coronary artery without angina pectoris: Secondary | ICD-10-CM | POA: Diagnosis not present

## 2019-01-19 DIAGNOSIS — I442 Atrioventricular block, complete: Secondary | ICD-10-CM | POA: Diagnosis not present

## 2019-01-19 DIAGNOSIS — I2584 Coronary atherosclerosis due to calcified coronary lesion: Secondary | ICD-10-CM | POA: Diagnosis not present

## 2019-01-19 DIAGNOSIS — Z950 Presence of cardiac pacemaker: Secondary | ICD-10-CM

## 2019-01-19 DIAGNOSIS — I82531 Chronic embolism and thrombosis of right popliteal vein: Secondary | ICD-10-CM

## 2019-01-19 NOTE — Progress Notes (Signed)
PCP: Bernerd Limbo, MD Primary EP: Dr Donaciano Eva is a 63 y.o. female who presents today for routine electrophysiology followup.  She has had some tachycardia and SOB with exertion per her caregiver.  This has improved over the past 2 weeks.  She saw our PA and had echo/ CT ordered with plans to follow-up with me today. Today, she denies symptoms of palpitations, chest pain,  lower extremity edema, dizziness, presyncope, or syncope.  The patient is otherwise without complaint today.   Past Medical History:  Diagnosis Date  . Arthritis   . Aspiration pneumonia ([place])   . Asthma   . Cardiac arrhythmia    have pacemeaker  . Down syndrome   . DVT of popliteal vein ([place]) 09/08/2018   chronic  . GERD (gastroesophageal reflux disease)   . Heart block   . Hypothyroidism   . Mental retardation   . Pacemaker   . Pulmonary emboli ([place])   . Seizures ([place])   . Sleep apnea with use of continuous positive airway pressure (CPAP)    Past Surgical History:  Procedure Laterality Date  . PACEMAKER INSERTION  12/09/2015   [employer] Accolade MRI EL PPM implanted in PA for complete heart block and syncope  . PEG TUBE PLACEMENT     removed in 2017  . TIBIA FRACTURE SURGERY Right     ROS- all systems are reviewed and negatives except as per HPI above  Current Outpatient Medications  Medication Sig Dispense Refill  . aspirin EC 81 MG tablet Take 1 tablet (81 mg total) by mouth daily. 90 tablet 3  . citalopram (CELEXA) 20 MG tablet Take 20 mg by mouth daily.    . cyanocobalamin (TH VITAMIN B12) 100 MCG tablet Take 100 mcg by mouth every morning.     . folic acid (FOLVITE) 1 MG tablet Take 1 mg  by mouth  daily    . furosemide (LASIX) 20 MG tablet Take 1 tablet (20 mg total) by mouth as needed for fluid or edema. 30 tablet 11  . lacosamide (VIMPAT) 50 MG TABS tablet Take 25 mg by mouth daily.    Marland Kitchen levETIRAcetam (KEPPRA XR) 500 MG 24 hr tablet Take 1 tab in AM, 2 tabs at bedtime  270 tablet 3  . levothyroxine (SYNTHROID, LEVOTHROID) 150 MCG tablet Take 150 mcg by mouth daily before breakfast.    . Melatonin 5 MG TABS Take 1 tablet by mouth at bedtime.    . nitroGLYCERIN (NITROSTAT) 0.4 MG SL tablet Place 1 tablet (0.4 mg total) under the tongue every 5 (five) minutes as needed for chest pain. 25 tablet 5  . OXYGEN Inhale 3 L into the lungs as directed. With CPAP     . phenytoin (DILANTIN) 200 MG ER capsule Take 1 capsule (200 mg total) by mouth 2 (two) times daily. 180 capsule 3  . polyethylene glycol powder (MIRALAX) powder Take 17 g by mouth 3 (three) times daily as needed for mild constipation. 255 g 11  . risperiDONE (RISPERDAL) 1 MG tablet I tablet BID    . simvastatin (ZOCOR) 40 MG tablet Take 40 mg  by mouth daily at bedtime 90 tablet 2  . XARELTO 20 MG TABS tablet TAKE 1 TABLET (20 MG TOTAL) BY MOUTH DAILY WITH SUPPER. 90 tablet 1   No current facility-administered medications for this visit.     Physical Exam: Vitals:   01/19/19 1405  BP: 108/64  Pulse: 72  SpO2: 97%  Weight: 159 lb 12.8 oz (72.5 kg)  Height: 4\' 11"  (1.499 m)    GEN- The patient is well appearing, alert and oriented x 3 today.   Head- normocephalic, atraumatic Eyes-  Sclera clear, conjunctiva pink Ears- hearing intact Oropharynx- clear Lungs- few scattered wheezes, normal work of breathing Heart- Regular rate and rhythm, no murmurs, rubs or gallops, PMI not laterally displaced GI- soft, NT, ND, + BS Extremities- no clubbing, cyanosis, or edema  Wt Readings from Last 3 Encounters:  01/19/19 159 lb 12.8 oz (72.5 kg)  12/28/18 161 lb 12.8 oz (73.4 kg)  12/19/18 157 lb (71.2 kg)    EKG tracing ordered today is personally reviewed and shows sinus rhythm  Echo and cardiac CT reviewed at length with patient and care giver.  Assessment and Plan:  1. Symptomatic transient complete heart block Normal pacemaker function by recent remotes No changes today  2. SOB Unclear  etiology Echo and cardiac CT not worrisome Consider PFTs if symptoms return  3. OSA bipap   4. Dementia Appears stable  5. Prior DVT/ PTE Consider long term anticoagulation Reduce xarelto to 10mg  daily after 6 months of therapy  Follows with pulmonary also [place] Return to see EP PA in 6 months  Hillis RangeJames Gilberta Peeters MD, [facility] 01/19/2019 2:26 PM

## 2019-01-19 NOTE — Patient Instructions (Addendum)
Medication Instructions:  Your physician recommends that you continue on your current medications as directed. Please refer to the Current Medication list given to you today.  Labwork: None ordered.  Testing/Procedures: None ordered.  Follow-Up: Your physician wants you to follow-up in: 6 months with Tommye Standard, PA.   You will receive a reminder letter in the mail two months in advance. If you don't receive a letter, please call our office to schedule the follow-up appointment.  Remote monitoring is used to monitor your Pacemaker from home. This monitoring reduces the number of office visits required to check your device to one time per year. It allows [place] to keep an eye on the functioning of your device to ensure it is working properly. You are scheduled for a device check from home on 04/20/2019. You may send your transmission at any time that day. If you have a wireless device, the transmission will be sent automatically. After your physician reviews your transmission, you will receive a postcard with your next transmission date.  Any Other Special Instructions Will Be Listed Below (If Applicable).  If you need a refill on your cardiac medications before your next appointment, please call your pharmacy.

## 2019-01-20 ENCOUNTER — Other Ambulatory Visit: Payer: Self-pay

## 2019-01-20 ENCOUNTER — Ambulatory Visit (INDEPENDENT_AMBULATORY_CARE_PROVIDER_SITE_OTHER): Payer: Medicare Other | Admitting: Podiatry

## 2019-01-20 ENCOUNTER — Encounter: Payer: Self-pay | Admitting: Podiatry

## 2019-01-20 VITALS — Temp 97.9°F

## 2019-01-20 DIAGNOSIS — B351 Tinea unguium: Secondary | ICD-10-CM | POA: Diagnosis not present

## 2019-01-20 DIAGNOSIS — M79675 Pain in left toe(s): Secondary | ICD-10-CM | POA: Diagnosis not present

## 2019-01-20 DIAGNOSIS — M79674 Pain in right toe(s): Secondary | ICD-10-CM

## 2019-01-20 NOTE — Patient Instructions (Signed)

## 2019-01-23 ENCOUNTER — Telehealth: Payer: Self-pay | Admitting: Neurology

## 2019-01-23 ENCOUNTER — Telehealth: Payer: Self-pay | Admitting: *Deleted

## 2019-01-23 NOTE — Telephone Encounter (Signed)
LMOM stating June 29th at 9:30 to July 2nd at 10:30 was available or July 6th at 9:30 to July 9th 10:30 was available. I let her know I do not work on Tuesdays but to please call Wednesday so we may come up with a time and date that suits her.

## 2019-01-23 NOTE — Telephone Encounter (Signed)
Pt's sister [name] would like to schedule an EEG 72-hour that Dr. Delice Lesch has requested. Pls call her.

## 2019-01-25 NOTE — Telephone Encounter (Signed)
Sister [name] [phone] called to check status of HOME EEG test instead of in office.  Please advise if upcoming appt for EEG is in pt home.  Sister advises great difficulty in preparing pt and transporting pt to appts. Please assure HOME EEG can be done.

## 2019-01-25 NOTE — Telephone Encounter (Signed)
Pt sister called informed that order was [facility]ed to [facility] for home EEG. That they should be calling her to get it scheduled

## 2019-01-25 NOTE — Progress Notes (Signed)
Open in error

## 2019-01-26 ENCOUNTER — Encounter: Payer: Self-pay | Admitting: Cardiology

## 2019-01-26 NOTE — Progress Notes (Signed)
Remote pacemaker transmission.   

## 2019-01-27 ENCOUNTER — Encounter: Payer: Self-pay | Admitting: *Deleted

## 2019-01-30 ENCOUNTER — Other Ambulatory Visit: Payer: Medicare Other

## 2019-01-31 ENCOUNTER — Ambulatory Visit (INDEPENDENT_AMBULATORY_CARE_PROVIDER_SITE_OTHER): Payer: Medicare Other | Admitting: Neurology

## 2019-01-31 DIAGNOSIS — G40211 Localization-related (focal) (partial) symptomatic epilepsy and epileptic syndromes with complex partial seizures, intractable, with status epilepticus: Secondary | ICD-10-CM

## 2019-01-31 NOTE — Progress Notes (Signed)
Subjective:  Kaitlyn MccreedyJeanne L Gauthreaux presents to clinic today with cc of  painful, thick, discolored, elongated toenails 1-5 b/l that become tender and cannot cut because of thickness.  Pain is aggravated when wearing enclosed shoe gear.  Her sister is present during the visit on today. She voices no new pedal concerns on today's visit.   Current Outpatient Medications:  .  aspirin EC 81 MG tablet, Take 1 tablet (81 mg total) by mouth daily., Disp: 90 tablet, Rfl: 3 .  citalopram (CELEXA) 20 MG tablet, Take 20 mg by mouth daily., Disp: , Rfl:  .  cyanocobalamin (TH VITAMIN B12) 100 MCG tablet, Take 100 mcg by mouth every morning. , Disp: , Rfl:  .  folic acid (FOLVITE) 1 MG tablet, Take 1 mg  by mouth  daily, Disp: , Rfl:  .  furosemide (LASIX) 20 MG tablet, Take 1 tablet (20 mg total) by mouth as needed for fluid or edema., Disp: 30 tablet, Rfl: 11 .  lacosamide (VIMPAT) 50 MG TABS tablet, Take 25 mg by mouth daily., Disp: , Rfl:  .  levETIRAcetam (KEPPRA XR) 500 MG 24 hr tablet, Take 1 tab in AM, 2 tabs at bedtime, Disp: 270 tablet, Rfl: 3 .  levothyroxine (SYNTHROID, LEVOTHROID) 150 MCG tablet, Take 150 mcg by mouth daily before breakfast., Disp: , Rfl:  .  Melatonin 5 MG TABS, Take 1 tablet by mouth at bedtime., Disp: , Rfl:  .  nitroGLYCERIN (NITROSTAT) 0.4 MG SL tablet, Place 1 tablet (0.4 mg total) under the tongue every 5 (five) minutes as needed for chest pain., Disp: 25 tablet, Rfl: 5 .  OXYGEN, Inhale 3 L into the lungs as directed. With CPAP , Disp: , Rfl:  .  phenytoin (DILANTIN) 200 MG ER capsule, Take 1 capsule (200 mg total) by mouth 2 (two) times daily., Disp: 180 capsule, Rfl: 3 .  polyethylene glycol powder (MIRALAX) powder, Take 17 g by mouth 3 (three) times daily as needed for mild constipation., Disp: 255 g, Rfl: 11 .  risperiDONE (RISPERDAL) 1 MG tablet, I tablet BID, Disp: , Rfl:  .  simvastatin (ZOCOR) 40 MG tablet, Take 40 mg  by mouth daily at bedtime, Disp: 90 tablet, Rfl:  2 .  XARELTO 20 MG TABS tablet, TAKE 1 TABLET (20 MG TOTAL) BY MOUTH DAILY WITH SUPPER., Disp: 90 tablet, Rfl: 1   Allergies  Allergen Reactions  . Cephalexin Other (See Comments)    Seizures   . Namenda [Memantine Hcl] Other (See Comments)    Made memory issues worse, sleepiness, increased confusion     Objective: Vitals:   01/20/19 1020  Temp: 97.9 F (36.6 C)    Physical Examination:  Vascular Examination: Capillary refill time <3 seconds x 10 digits.  Palpable DP/PT pulses b/l.  Digital hair absent b/l.  Trace edema noted b/l.  Skin temperature gradient WNL b/l.  Dermatological Examination: Skin with normal turgor, texture and tone b/l.  No open wounds b/l.  No interdigital macerations noted b/l.  Elongated, thick, discolored brittle toenails with subungual debris and pain on dorsal palpation of nailbeds 1-5 b/l.  Musculoskeletal Examination: Muscle strength 5/5 to all muscle groups b/l  No pain, crepitus or joint discomfort with active/passive ROM.  Neurological Examination: Sensation intact 5/5 b/l with 10 gram monofilament.  Vibratory sensation intact b/l.  Assessment: Mycotic nail infection with pain 1-5 b/l  Plan: 1. Toenails 1-5 b/l were debrided in length and girth without iatrogenic laceration. 2.  Continue soft, supportive  shoe gear daily. 3.  Report any pedal injuries to medical professional. 4.  Follow up 3 months. 5.  Patient/POA to call should there be a question/concern in there interim.

## 2019-02-02 ENCOUNTER — Telehealth: Payer: Self-pay | Admitting: Pulmonary Disease

## 2019-02-02 NOTE — Telephone Encounter (Signed)
Spoke with pt's sister [name]  She states received a letter that she needed to call about pt's ct results  She states that this was not necessary, as she had already been given the results Nothing further needed

## 2019-02-13 ENCOUNTER — Telehealth (INDEPENDENT_AMBULATORY_CARE_PROVIDER_SITE_OTHER): Payer: Medicare Other | Admitting: Neurology

## 2019-02-13 ENCOUNTER — Encounter: Payer: Self-pay | Admitting: Neurology

## 2019-02-13 ENCOUNTER — Other Ambulatory Visit: Payer: Self-pay

## 2019-02-13 VITALS — BP 114/77 | HR 55 | Temp 98.2°F | Ht <= 58 in | Wt 155.0 lb

## 2019-02-13 DIAGNOSIS — Q909 Down syndrome, unspecified: Secondary | ICD-10-CM

## 2019-02-13 DIAGNOSIS — G40211 Localization-related (focal) (partial) symptomatic epilepsy and epileptic syndromes with complex partial seizures, intractable, with status epilepticus: Secondary | ICD-10-CM | POA: Diagnosis not present

## 2019-02-13 MED ORDER — TOPIRAMATE 25 MG PO TABS: ORAL_TABLET | ORAL | 5 refills | Status: DC

## 2019-02-13 NOTE — Progress Notes (Signed)
Virtual Visit via Video Note The purpose of this virtual visit is to provide medical care while limiting exposure to the novel coronavirus.    Consent was obtained for video visit:  Yes.   Answered questions that patient had about telehealth interaction:  Yes.   I discussed the limitations, risks, security and privacy concerns of performing an evaluation and management service by telemedicine. I also discussed with the patient that there may be a patient responsible charge related to this service. The patient expressed understanding and agreed to proceed.  Pt location: Home Physician Location: office Name of referring provider:  Tracey HarriesBouska, David, MD I connected with Kaitlyn Powell at patient's POA initiation/request on 02/13/2019 at  9:30 AM EDT by video enabled telemedicine application and verified that I am speaking with the correct person using two identifiers. Pt MRN:  [id number] Pt DOB:  March 13, 1956 Video Participants:  Kaitlyn Powell;  [name] (sister, POA), Palma HolterGeorge Powell (brother-in-law)   History of Present Illness:  The patient was seen as a virtual video visit on 02/13/2019. She was last seen 2 months ago for continued seizures with addition of low dose Vimpat to Dilantin 200mg  BID and Levetiracetam 500mg  in AM, 1000mg  in PM. Vimpat has been quite sedating to her, she is only on 25mg  qhs, higher doses caused significant drowsiness. She had a 72-hour ambulatory video EEG study from June 30-February 03, 2019 which I personally reviewed, baseline EEG was abnormal with frequent generalized spikes, independent epileptiform discharges over the left and right temporal regions. There were 3 push button events, all in the bathroom, with 2 of them she is sitting on the commode then suddenly jerks back and appears unresponsive for a few seconds. There were no clear epileptiform discharges during these episodes but there was low voltage fast activity/muscle, different from her baseline. No EKG  changes. The third episode was lethargy/decreased responsiveness with no body jerk. Her sister has also noticed that she is having more cognitive issues, having a harder time finding her words. She has noticed that she would hold her hand in a pincer position more often. She is currently drowsy after her meal, which is typical for her.   History on Initial Assessment 09/02/2018: This is a pleasant 63 year old left-handed woman with a history of Down syndrome, sleep apnea, hypothyroidism, sick sinus syndrome s/p PPM, presenting for second opinion regarding seizures. Kaitlyn Powell reports that Kaitlyn Powell seizures started in her teens/early 6420s, they would be walking and all of a sudden she would drop down. She would be awake and appear to lose all muscle tone, family would pick her up and she would be fine. She had been taking Dilantin for many years. Kaitlyn Powell reports a period of time where she was not having seizures, or at least they were not hearing any news from her group home. In 2017, she became quite ill [place], CHF, and pulmonary embolism. She was on Lovenox until November 2017. There was concern for macrocytic anemia with normal B12 level possibly due to prolonged Dilantin use, she had been on Dilantin 200mg  BID. She had an infected PEG tube and had a GTC lasting 20 minutes. She was seen by neurologist Dr. Terrace ArabiaYan in 2018 due to drop seizures occurring on a daily basis with loss of consciousness lasting less than a minute. She was started on Levetiracetam, however with attempt to wean down Dilantin, she had several GTCs. She was restarted on Dilantin 200mg  BID and Keppra  dose was reduced. She had an MRI brain without contrast in July 2018 which showed congenital microcephaly, moderate diffuse volum loss, moderate chronic microvascular disease. Routine EEG reported moderate to severe background slowing with rare generalized spike slow waves. The daily episodes decreased in frequency and  would only occur when she was on the toilet. Initially they were occurring when she was having a BM and family could visibly see her abdomen straining, however recently they have occurred while peeing and without any visible straining. She would be out for 10-15 seconds, then wake up looking like she is trying to grab something or moving her hands L>R like she is scratching her leg. She would be talking gibberish or incoherent like baby talk when she starts coming around. A few times she would black out and go backward like someone pushed her back hard. She had a 3-day ambulatory video EEG study in November 2018. Background was diffusely slow with sharp and spike and wave discharges in the left frontotemporal region, bihemispheric, bisynchronous spike and wave discharges, isolated bursts of generalized spike and wave discharges, independent discharges in the right frontotemporal region. In general, there was a bifrontal predominance of discharges including the presence of occasional 2-2.5 slow spike and wave discharges. There was decreased amplitude over the right temporal area. There were 6 push button events, all due to "drop seizure on toilet." With 5 events, there was note of diffuse slow background with several bursts of bilateral rhythmic delta activity/intermittent delta activity. It was noted that event #6 had generalized frontally predominant 2 Hz spike and wave activity lasting 17 seconds with some after coming slow waves. Keppra dose was increased to 2500mg  daily at one point, then dose was reduced to 1000mg  qhs. She was tried on Lamotrigine but could not tolerate 25mg  dose, "looked like she was institutionalized." On 08/01/18, Kaitlyn Powell could not wake her up in the morning, she would open her eyes but was unresponsive. Kaitlyn sat her up and she started jerking for 10 minutes. They tried to stand her up after but they were dragging her like she was drunk and could not move her legs. She started waking up and  family gave her medications, went back to sleep, then again had difficulty waking her up followed by upper body jerking when she was sat up. She was found to have a pneumonia. Keppra dose increased to 1000mg  BID in addition to Dilantin 200mg  BID. Family reports some days she would have 3 in one day, other times she would go 48 hours without an event. Last episode was yesterday. She is also on Risperdal 1mg  BID, family is unsure when or why this was prescribed, she is always happy and always positive.   Prior AEDs: Lamotrigine    Observations/Objective:   Vitals:   02/13/19 0834  BP: 114/77  Pulse: (!) 55  Temp: 98.2 F (36.8 C)  SpO2: 95%  Weight: 155 lb (70.3 kg)  Height: 4\' 10"  (1.473 m)   GEN:  The patient appears stated age and is in NAD. Down syndrome facies. Neurological examination: She is drowsy and goes to sleep when she is not stimulated. Moves all extremities symmetrically.   Assessment and Plan:   This is a pleasant 63 yo LH woman with a history of Down syndrome, sleep apnea, hypothyroidism, sick sinus syndrome s/p PPM, and seizures. She continues to have recurrent episodes of brief loss of consciousness that majority of the time occur while sitting on the toilet, although she has had 2  recently sitting on the kitchen table. Although episodes are suggestive of vasovagal syncope, review of video on most recent 72-hour EEG showed semiology not consistent with syncope, concerning for drop attacks with diffuse low voltage fast activity on EEG. We discussed continued trial of another seizure medication that she would hopefully tolerate better. They are agreeable to starting low dose Topiramate 25mg  qhs, side effects discussed, we hope to uptitrate in the future. They are also reporting more cognitive decline which is likely related to dementia seen with Down syndrome, monitor as we start Topiramate. They will stop the low dose Vimpat and continue Keppra 500mg  in AM, 1000mg  in PM and  Dilantin 200mg  BID. Follow-up in 3 months, family knows to call for any changes.    Follow Up Instructions:   -I discussed the assessment and treatment plan with the patient's sister. The patient's sister was provided an opportunity to ask questions and all were answered. The patient's sister agreed with the plan and demonstrated an understanding of the instructions.   The patient's sister was advised to call back or seek an in-person evaluation if the symptoms worsen or if the condition fails to improve as anticipated.   Cameron Sprang, MD

## 2019-03-09 ENCOUNTER — Other Ambulatory Visit: Payer: Self-pay

## 2019-03-09 NOTE — Procedures (Signed)
ELECTROENCEPHALOGRAM REPORT   Patient's Name: Kaitlyn Powell MRN: [id number] Date of Birth: 28-Sep-1955  Ordering Provider: Ellouise Newer, MD DX CODE(s): G40.209, R55 EXAM DURATION: 65 Hours and 59 Minutes EEG RECORDING DAY ONE:  1-Jul 95715-EEG with Video 12-26 hours intermittent monitoring EEG RECORDING DAY TWO:  2-Jul 95715-EEG with Video 12-26 hours intermittent monitoring EEG RECORDING DAY THREE:  3-Jul 95715-EEG with Video 12-26 hours intermittent monitoring  CLINICAL HISTORY: This is a 63 year old female with past medical history of down syndrome, sick sinus syndrome, sleep apnea and hypothyroidism with recurrent episodes of transient loss of consciousness, majority occurring on the commode. She will be very fatigued and become unresponsive. She continues to have recurrent events on 3 AEDs. EEG for classification.   MEDICATION(s): Dilantin, Keppra, Vimpat, Xarelto  LONG-TERM EEG/VEEG RECORDING SET-UP and TAKE-DOWN TECHNICAL SUMMARY: Twenty-five   (25)   disposable   electrodes   were   applied   according   to   the   standard   10-20   international measurement and placement protocol in person by an EEG Technologist for the purposes of recording long-term  video  EPP;(29)  cephalic,  (2)  J1/O8  sub-temporal,  (1)  ground,  (1)  system  reference,  and  (2)  ECG.    Data was  recorded  on  a  24-channel  Lifelines  EEG  recording  device  with  a  sampling  rate  of  200  samples  per second/per channel, at impedance levels less than 10 K Ohms.  Once the exam was completed, the recording was halted, electrodes carefully removed, and data transferred.  SET-UP TECH: Reinaldo Meeker  RECORDING SET-UP DATE: 01/31/2019, 6:31pm RECORDING TAKE-DOWN DATE: 02/03/2019, 12:55pm  INTERMITTENT MONITORING with VIDEO TECHNICAL SUMMARY Long-Term  EEG  with  Video  was  monitored  intermittently  by a  qualified  EEG  technologist  for  the  entirety  of the   recording;   quality   check-ins   were    performed   at   a   minimum   of   every   two   hours,   checking   and documenting  real-time  data  and  video  to  assure  the  integrity  and  quality  of  the  recording  (e.g.,  camera position,  electrodeintegrity  and  impedance),  and  identify  the  need  for  maintenance.    For  intermittent monitoring,  an  EEG  Technologist  monitored  no  more  than  12  patients  concurrently.    Diagnostic  video  was captured at least 80% of the time during the recording.  PRUNING TECHNICAL SUMMARY: At  the  end  of  the  recording,  the  EEG  Technologist  generates  a  technical  description,  which  is  the  EEG Technologists  written  documentation  of the  reviewed  video-EEG  data,  including  technical  interventions  and these  elements:  reviewing  raw  EEG/VEEG  data  and  events  and  automated  detection  as  well  as  patient pushbutton  event  activations;  and  annotating,  editing  and  archiving  EEG/VEEG  data for  review  by  the physician or other qualified healthcare professional.  For review,  the Video EEG recording can be visualized in all standard types of montages, 16 channels and greater, and playbacks include digital  high frequency filters previously  noted.    The  Video  EEG  has  been  notated  with  patient  typical  symptom  events  at  the  direction  of the  patient  by  depressing  a  push  button  mounted  on  a  waist  worn  Lifelines  EEG  recording  device.    Digital spike and seizure detection software was used to identify potential abnormalities in the EEG, and alerts were reviewed and annotated by the technologist in the [employer] EEG Review software.   Video EEG and report are notated with events that were determined to be of significance by the digital analysis software showing spike and seizure detections. A description of the terms used to quantify spikes using a visual analog scale includes: Abundant, a spike-wave index of 50-90%. ** =Note-Exam was shortened due  to excessive artifact in order to preserve the integrity of the exam.   SPIKE AND SEIZURE ANALYSIS AND REVIEW: 749 spike and seizure detection software alerts have been reviewed by the EEG technologist.688  spike  alerts  were  reviewed  and  analyzed  by  the  EEG  technologist;  and  none  of  these  alerts  appear  to have clinical significance.61  seizure  alerts  were reviewed  and  analyzed  by  the  technologist;  however,  none  of  the  alerts  appear  to have clinical significance. PUSH BUTTON EVENTS: A button press or notation was made 3 times.  Patient log was reviewed with the patient at disconnect with the intent to reconcile events.  DESCRIPTION OF RECORDING: During maximal wakefulness, the background activity consisted of a symmetric 5-6Hz  posterior dominant rhythm that was poorly reactive to eye opening and eye closure. There is diffuse 4-5 Hz theta slowing of the waking background. There were frequent generalized 2-2.5 Hz slow spike and wave discharges, at times occurring in bursts lasting 1-2 seconds. There were occasional independent bifrontal/frontopolar, left temporal epileptiform discharges, rare right temporal epileptiform discharges.   During the recording, the patient progresses through wakefulness, drowsiness, and sleep. Vertex waves and poorly formed sleep spindles were seen. There is an increase in frequency of generalized discharges seen in sleep. There were occasional independent left greater than right temporal epileptiform discharges.   Events: On 6/30 at 8:42PM, patient is sitting on commode then suddenly jerks and falls back, mouth open, with decreased responsiveness. Electrographically, there is diffuse background suppression for 14 seconds before EEG is obscured by muscle artifact for 31 seconds, then baseline diffuse 4-5 Hz slowing is seen. No EKG changes noted.   On 7/2 at 5:43PM, she is sitting on the commode then suddenly leans and falls backward with slight  stiffening of arms. Electrographically, there is diffuse background suppression for 4 seconds then baseline 4-5 Hz slowing is seen. No EKG changes noted.  On 7/3 at 10:42AM, she is on the commode and becomes less responsie, sleepy and lethargic. Electrographically, no clear changes from baseline EEG seen, no EKG changes.  EKG lead was unremarkable.   IMPRESSION: This 65-hour ambulatory EEG study is abnormal due to the presence of: 1. Generalized background slowing 2. Frequent generalized 2-2.5 Hz slow spike and wave discharges 3. Multifocal epileptiform discharges over the bifrontal/frontopolar, left > right temporal regions  4. There were 3 episodes captured, 2 with typical drop attacks with associated diffuse background suppression    CLINICAL CORRELATION of the above findings is consistent with symptomatic generalized epilepsy with multifocal epileptiform discharges. Typical episodes captured showed diffuse background suppression consistent with drop attacks. If further clinical questions remain, inpatient video EEG monitoring may be helpful.   Patrcia DollyKaren Audwin Semper, M.D.

## 2019-03-22 ENCOUNTER — Other Ambulatory Visit: Payer: Self-pay

## 2019-03-22 ENCOUNTER — Encounter: Payer: Self-pay | Admitting: Neurology

## 2019-03-22 ENCOUNTER — Ambulatory Visit: Payer: Medicare Other | Admitting: Neurology

## 2019-03-22 ENCOUNTER — Telehealth (INDEPENDENT_AMBULATORY_CARE_PROVIDER_SITE_OTHER): Payer: Medicare Other | Admitting: Neurology

## 2019-03-22 VITALS — Ht 59.0 in | Wt 159.0 lb

## 2019-03-22 DIAGNOSIS — G40211 Localization-related (focal) (partial) symptomatic epilepsy and epileptic syndromes with complex partial seizures, intractable, with status epilepticus: Secondary | ICD-10-CM | POA: Diagnosis not present

## 2019-03-22 DIAGNOSIS — Q909 Down syndrome, unspecified: Secondary | ICD-10-CM

## 2019-03-22 DIAGNOSIS — R410 Disorientation, unspecified: Secondary | ICD-10-CM

## 2019-03-22 NOTE — Progress Notes (Signed)
Virtual Visit via Video Note The purpose of this virtual visit is to provide medical care while limiting exposure to the novel coronavirus.    Consent was obtained for video visit:  Yes.   Answered questions that patient had about telehealth interaction:  Yes.   I discussed the limitations, risks, security and privacy concerns of performing an evaluation and management service by telemedicine. I also discussed with the patient that there may be a patient responsible charge related to this service. The patient expressed understanding and agreed to proceed.  Pt location: Home Physician Location: office Name of referring provider:  Tracey Powell, David, MD I connected with Kaitlyn Powell at patient's POA initiation/request on 03/22/2019 at  4:00 PM EDT by video enabled telemedicine application and verified that I am speaking with the correct person using two identifiers. Pt MRN:  [id number] Pt DOB:  1956-01-04 Video Participants:  Kaitlyn MccreedyJeanne L Powell;  Kaitlyn BruinsMary Powell (sister, POA), Kaitlyn HolterGeorge Powell (brother-in-law)   History of Present Illness:  The patient was seen as a virtual video visit on 03/22/2019. She was last seen 1 month ago for continued seizures. She has been sensitive to addition of different AEDs. Low dose Topamax was started last month however she became very lethargic, sleeping after breakfast. She was having much more difficulty ambulating. They stopped Topamax and a week after increased Levetiracetam to 1000mg  BID. They feel this may have helped, she is not having as much seizures in the past 2 weeks, occurring every 2-3 days instead of daily. She is also on Dilantin 200mg  BID. She is also having more cognitive and behavioral changes, she would stare at them and may answer yes but would not answer further. This would wax and wane, she would be talking and happy, then it would be like flipping a switch, she would be unresponsive with her head turned to the right side (sometimes to the left) with  chewing movements. She has more tremors, when they hold her hand they can feel the vibration in her arms and she would be wobbly trying to walk. Kaitlyn DandyMary feels like the signal from her brain is not going to her legs, she takes a few steps and stops, and when they instruct her to walk, she says "I am." For the past few months, they noticed she would tilt her head to the right, looking up to the ceiling, and when they ask what she is looking at, she would not say.   History on Initial Assessment 09/02/2018: This is a pleasant 63 year old left-handed woman with a history of Down syndrome, sleep apnea, hypothyroidism, sick sinus syndrome s/p PPM, presenting for second opinion regarding seizures. Kaitlyn DandyMary reports that Kaitlyn Powell's seizures started in her teens/early 3320s, they would be walking and all of a sudden she would drop down. She would be awake and appear to lose all muscle tone, family would pick her up and she would be fine. She had been taking Dilantin for many years. Kaitlyn DandyMary reports a period of time where she was not having seizures, or at least they were not hearing any news from her group home. In 2017, she became quite ill and was [place] in [place], CHF, and pulmonary embolism. She was on Lovenox until November 2017. There was concern for macrocytic anemia with normal B12 level possibly due to prolonged Dilantin use, she had been on Dilantin 200mg  BID. She had an infected PEG tube and had a GTC lasting 20 minutes. She was seen by neurologist Kaitlyn Powell  in 2018 due to drop seizures occurring on a daily basis with loss of consciousness lasting less than a minute. She was started on Levetiracetam, however with attempt to wean down Dilantin, she had several GTCs. She was restarted on Dilantin 200mg  BID and Keppra dose was reduced. She had an MRI brain without contrast in July 2018 which showed congenital microcephaly, moderate diffuse volum loss, moderate chronic microvascular disease. Routine EEG reported  moderate to severe background slowing with rare generalized spike slow waves. The daily episodes decreased in frequency and would only occur when she was on the toilet. Initially they were occurring when she was having a BM and family could visibly see her abdomen straining, however recently they have occurred while peeing and without any visible straining. She would be out for 10-15 seconds, then wake up looking like she is trying to grab something or moving her hands L>R like she is scratching her leg. She would be talking gibberish or incoherent like baby talk when she starts coming around. A few times she would black out and go backward like someone pushed her back hard. She had a 3-day ambulatory video EEG study in November 2018. Background was diffusely slow with sharp and spike and wave discharges in the left frontotemporal region, bihemispheric, bisynchronous spike and wave discharges, isolated bursts of generalized spike and wave discharges, independent discharges in the right frontotemporal region. In general, there was a bifrontal predominance of discharges including the presence of occasional 2-2.5 slow spike and wave discharges. There was decreased amplitude over the right temporal area. There were 6 push button events, all due to "drop seizure on toilet." With 5 events, there was note of diffuse slow background with several bursts of bilateral rhythmic delta activity/intermittent delta activity. It was noted that event #6 had generalized frontally predominant 2 Hz spike and wave activity lasting 17 seconds with some after coming slow waves. Keppra dose was increased to 2500mg  daily at one point, then dose was reduced to 1000mg  qhs. She was tried on Lamotrigine but could not tolerate 25mg  dose, "looked like she was institutionalized." On 08/01/18, Kaitlyn Powell could not wake her up in the morning, she would open her eyes but was unresponsive. Kaitlyn sat her up and she started jerking for 10 minutes. They tried to  stand her up after but they were dragging her like she was drunk and could not move her legs. She started waking up and family gave her medications, went back to sleep, then again had difficulty waking her up followed by upper body jerking when she was sat up. She was found to have a pneumonia. Keppra dose increased to 1000mg  BID in addition to Dilantin 200mg  BID. Family reports some days she would have 3 in one day, other times she would go 48 hours without an event. Last episode was yesterday. She is also on Risperdal 1mg  BID, family is unsure when or why this was prescribed, she is always happy and always positive.   Update 02/13/2019: Vimpat has been quite sedating to her, she is only on 25mg  qhs, higher doses caused significant drowsiness. She had a 72-hour ambulatory video EEG study from June 30-February 03, 2019 which I personally reviewed, baseline EEG was abnormal with frequent generalized spikes, independent epileptiform discharges over the left and right temporal regions. There were 3 push button events, all in the bathroom, with 2 of them she is sitting on the commode then suddenly jerks back and appears unresponsive for a few seconds. There were no  clear epileptiform discharges during these episodes but there was low voltage fast activity/muscle, different from her baseline. No EKG changes. The third episode was lethargy/decreased responsiveness with no body jerk. Her sister has also noticed that she is having more cognitive issues, having a harder time finding her words. She has noticed that she would hold her hand in a pincer position more often. She is currently drowsy after her meal, which is typical for her.   Prior AEDs: Lamotrigine, Vimpat, Topamax   Observations/Objective:   Vitals:   03/22/19 1606  Weight: 159 lb (72.1 kg)  Height: 4\' 11"  (1.499 m)   GEN:  The patient appears stated age and is in NAD. Down syndrome facies. She is sitting with chewing movements, says "Bye" but unable to  follow any commands. Moves all extremities symmetrically.   Assessment and Plan:   This is a pleasant 63 yo LH woman with a history of Down syndrome, sleep apnea, hypothyroidism, sick sinus syndrome s/p PPM, and seizures. She continues to have recurrent episodes of brief loss of consciousness that majority of the time occur while sitting on the toilet, although she has had 2 recently sitting on the kitchen table. Although episodes are suggestive of vasovagal syncope, review of video on most recent 72-hour EEG showed semiology not consistent with syncope, concerning for drop attacks with diffuse low voltage fast activity on EEG. She has been very sensitive to trials of low doses of AEDs, we discussed a trial of Epidiolex. Head CT without contrast will be ordered for altered mental status. Family is requesting for a gait/transfer belt, as well as home PT to help with mobilization. Continue Levetiracetam 1000mg  BID and Dilantin 200mg  BID. Follow-up in 3 months, family knows to call for any changes.   Follow Up Instructions:   -I discussed the assessment and treatment plan with the patient's sister. The patient's sister was provided an opportunity to ask questions and all were answered. The patient's sister agreed with the plan and demonstrated an understanding of the instructions.   The patient's sister was advised to call back or seek an in-person evaluation if the symptoms worsen or if the condition fails to improve as anticipated.   Van ClinesKaren M Donivin Wirt, MD

## 2019-03-28 ENCOUNTER — Telehealth: Payer: Self-pay

## 2019-03-28 ENCOUNTER — Other Ambulatory Visit: Payer: Self-pay

## 2019-03-28 ENCOUNTER — Other Ambulatory Visit: Payer: Self-pay | Admitting: Internal Medicine

## 2019-03-28 DIAGNOSIS — G40909 Epilepsy, unspecified, not intractable, without status epilepticus: Secondary | ICD-10-CM

## 2019-03-28 DIAGNOSIS — Q909 Down syndrome, unspecified: Secondary | ICD-10-CM

## 2019-03-28 NOTE — Telephone Encounter (Signed)
Pts sister?  [name] is asking for a script for a transfer belt and would like for it to be mailed to their home address.  Also per [name] pts weight is 152lbs for the CBD prescription.

## 2019-03-30 ENCOUNTER — Other Ambulatory Visit: Payer: Self-pay

## 2019-03-30 DIAGNOSIS — R569 Unspecified convulsions: Secondary | ICD-10-CM

## 2019-03-30 DIAGNOSIS — R269 Unspecified abnormalities of gait and mobility: Secondary | ICD-10-CM

## 2019-03-30 DIAGNOSIS — Q909 Down syndrome, unspecified: Secondary | ICD-10-CM

## 2019-03-31 ENCOUNTER — Other Ambulatory Visit: Payer: Self-pay

## 2019-03-31 MED ORDER — GAIT/TRANSFER BELT MISC: 1.0000 | Freq: Every day | 3 refills | Status: DC

## 2019-03-31 NOTE — Telephone Encounter (Signed)
Gait belt ordered and sent to pharmacy.  Will fax form for CBD Rx  When given by Dr. Delice Lesch.

## 2019-03-31 NOTE — Telephone Encounter (Signed)
Thanks, pls order transfer/gait belt. I will fill out the form so she can start the Epidiolex prescription. Thanks!

## 2019-04-03 ENCOUNTER — Other Ambulatory Visit: Payer: Self-pay | Admitting: Neurology

## 2019-04-03 ENCOUNTER — Other Ambulatory Visit: Payer: Self-pay

## 2019-04-03 DIAGNOSIS — Z5181 Encounter for therapeutic drug level monitoring: Secondary | ICD-10-CM

## 2019-04-03 MED ORDER — GAIT/TRANSFER BELT MISC: 1.0000 | Freq: Every day | 3 refills | Status: DC

## 2019-04-03 MED ORDER — EPIDIOLEX 100 MG/ML PO SOLN: ORAL | 4 refills | Status: DC

## 2019-04-04 DIAGNOSIS — L03114 Cellulitis of left upper limb: Secondary | ICD-10-CM | POA: Insufficient documentation

## 2019-04-04 DIAGNOSIS — M79642 Pain in left hand: Secondary | ICD-10-CM | POA: Insufficient documentation

## 2019-04-05 ENCOUNTER — Other Ambulatory Visit: Payer: Medicare Other

## 2019-04-05 ENCOUNTER — Ambulatory Visit
Admission: RE | Admit: 2019-04-05 | Discharge: 2019-04-05 | Disposition: A | Discharge status: Home / Self Care | Payer: Medicare Other | Source: Ambulatory Visit | Attending: Neurology | Admitting: Neurology

## 2019-04-05 ENCOUNTER — Other Ambulatory Visit: Payer: Self-pay

## 2019-04-05 DIAGNOSIS — R569 Unspecified convulsions: Secondary | ICD-10-CM

## 2019-04-05 DIAGNOSIS — R4182 Altered mental status, unspecified: Secondary | ICD-10-CM

## 2019-04-05 DIAGNOSIS — R410 Disorientation, unspecified: Secondary | ICD-10-CM

## 2019-04-05 DIAGNOSIS — Z5181 Encounter for therapeutic drug level monitoring: Secondary | ICD-10-CM

## 2019-04-06 ENCOUNTER — Telehealth: Payer: Self-pay

## 2019-04-06 ENCOUNTER — Telehealth: Payer: Self-pay | Admitting: Neurology

## 2019-04-06 LAB — HEPATIC FUNCTION PANEL
ALT: 12 IU/L (ref 0–32)
AST: 20 IU/L (ref 0–40)
Albumin: 3.2 g/dL — ABNORMAL LOW (ref 3.8–4.8)
Alkaline Phosphatase: 98 IU/L (ref 39–117)
Bilirubin Total: <0.2 mg/dL (ref 0.0–1.2)
Bilirubin, Direct: 0.08 mg/dL (ref 0.00–0.40)
Total Protein: 7.4 g/dL (ref 6.0–8.5)

## 2019-04-06 NOTE — Telephone Encounter (Signed)
Dr. Delice Lesch,  Kaitlyn Powell was in the lab yesterday her sister asked Kieth Brightly to also draw these additional labs since the pt is such a hard stick. Are you ok with sending these labs as well?

## 2019-04-06 NOTE — Telephone Encounter (Signed)
Caller states physical therapist with [facility]. States that she needs to leave a message with the office about a delay in a home care visit.  Caller states the patient's sister has canceled her admission visit twice, states that the patient is not doing well, physical therapist states that they will attempt to do visits again through the end of the 04/15/19. 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

## 2019-04-06 NOTE — Telephone Encounter (Signed)
[name] informed of pts CT results.  She doesn't understand why the pt has declined so much. [name] request to have an OV so Dr. Delice Lesch can see the pt or have a virtual visit to discuss pts regression.  [name] would also like to discuss labs and if Dr. Delice Lesch approved to have additional labs added?

## 2019-04-06 NOTE — Telephone Encounter (Signed)
-----   Message from Cameron Sprang, MD sent at 04/06/2019  8:44 AM EDT ----- Pls let her sister know that the head CT did not show any changes from scan in 2019, no evidence of tumor, stroke, or bleed. It showed diffuse atrophy. Thanks

## 2019-04-06 NOTE — Telephone Encounter (Signed)
Patient's sister, [name], called to request Dr. Delice Lesch enter orders for blood that was drawn at the lab by [facility]. Orders needed: Lipid, TSH, and CBC.

## 2019-04-07 ENCOUNTER — Other Ambulatory Visit: Payer: Self-pay

## 2019-04-07 ENCOUNTER — Telehealth: Payer: Self-pay

## 2019-04-07 DIAGNOSIS — R569 Unspecified convulsions: Secondary | ICD-10-CM

## 2019-04-07 DIAGNOSIS — R410 Disorientation, unspecified: Secondary | ICD-10-CM

## 2019-04-07 DIAGNOSIS — R4182 Altered mental status, unspecified: Secondary | ICD-10-CM

## 2019-04-07 DIAGNOSIS — Z5181 Encounter for therapeutic drug level monitoring: Secondary | ICD-10-CM

## 2019-04-07 NOTE — Telephone Encounter (Signed)
Pls order add-on CBC, BMP, lipid panel, TSH. Pls also order open MRI brain with and without contrast at [facility]. Dx: altered mental status. Thanks

## 2019-04-07 NOTE — Telephone Encounter (Signed)
See other phone note

## 2019-04-07 NOTE — Addendum Note (Signed)
Addended by: Kaylyn Lim I on: 04/07/2019 12:04 PM   Modules accepted: Orders

## 2019-04-07 NOTE — Telephone Encounter (Signed)
Noted  

## 2019-04-07 NOTE — Addendum Note (Signed)
Addended by: STONE-ELMORE, Aadi Bordner I on: 04/07/2019 12:04 PM   Modules accepted: Orders  

## 2019-04-07 NOTE — Telephone Encounter (Signed)
Prior Auth initiated through [employer] for Epidiolex 100mg /76ml (Cannabidiol Solution)

## 2019-04-07 NOTE — Telephone Encounter (Signed)
Lab orders and MR brain have been ordered. Kieth Brightly in our lab has been notified of the orders.  Need to fax MR order to [facility]. Which facility does pt wish to go?

## 2019-04-07 NOTE — Telephone Encounter (Signed)
That is fine, thanks 

## 2019-04-07 NOTE — Addendum Note (Signed)
Addended by: Kaylyn Lim I on: 04/07/2019 01:32 PM   Modules accepted: Orders

## 2019-04-07 NOTE — Addendum Note (Signed)
Addended by: STONE-ELMORE, Kenai Fluegel I on: 04/07/2019 12:04 PM   Modules accepted: Orders  

## 2019-04-07 NOTE — Addendum Note (Signed)
Addended by: STONE-ELMORE, Lyann Hagstrom I on: 04/07/2019 12:04 PM   Modules accepted: Orders  

## 2019-04-08 LAB — CBC WITH DIFFERENTIAL/PLATELET

## 2019-04-08 LAB — LIPID PANEL
Chol/HDL Ratio: 1.8 ratio (ref 0.0–4.4)
Cholesterol, Total: 187 mg/dL (ref 100–199)
HDL: 104 mg/dL (ref >39)
LDL Chol Calc (NIH): 72 mg/dL (ref 0–99)
Triglycerides: 56 mg/dL (ref 0–149)
VLDL Cholesterol Cal: 11 mg/dL (ref 5–40)

## 2019-04-08 LAB — TSH: TSH: 3.91 u[IU]/mL (ref 0.450–4.500)

## 2019-04-11 ENCOUNTER — Telehealth: Payer: Self-pay

## 2019-04-11 ENCOUNTER — Other Ambulatory Visit: Payer: Self-pay

## 2019-04-11 DIAGNOSIS — R4182 Altered mental status, unspecified: Secondary | ICD-10-CM

## 2019-04-11 DIAGNOSIS — Z5181 Encounter for therapeutic drug level monitoring: Secondary | ICD-10-CM

## 2019-04-11 DIAGNOSIS — R413 Other amnesia: Secondary | ICD-10-CM

## 2019-04-11 NOTE — Telephone Encounter (Signed)
-----   Message from Cameron Sprang, MD sent at 04/10/2019 10:17 PM EDT ----- Pls let sister [name] know that the lipid panel showed normal cholesterol levels, normal TSH. The CBC was clotted and it looks like not enough collected for BMP (basic chemistry panel), unfortunately would need re-draw for CBC and BMP. Thanks

## 2019-04-11 NOTE — Telephone Encounter (Signed)
Pts sister, Kaitlyn Powell informed of lab results.  She will bring pt back when she has a good day for repeat labs.  MRI order on Dr. Amparo Bristol desk to be signed and then will need to be faxed to [facility] located on [address] In [place]. Kaitlyn Powell requested this location. They have an open MRI.  The Cannabidiol solution has been approved and [facility] pharmacy has been notified.

## 2019-04-12 ENCOUNTER — Telehealth: Payer: Self-pay | Admitting: Neurology

## 2019-04-12 NOTE — Telephone Encounter (Signed)
[name] left a VM for Caryl Pina to call her back about patient MRI

## 2019-04-12 NOTE — Telephone Encounter (Signed)
Left message for [facility].  Dr Delice Lesch,  Are you ok with once weekly PT for nine weeks?

## 2019-04-12 NOTE — Telephone Encounter (Signed)
The following message was left with [employer] on 04/11/2019 at 4:50 AM.  Kaitlyn Powell is a physical therapist with [facility] and needs to leave a message for the doctor about home care orders. Caller needs home care orders from the doctor for once a week for nine weeks of physical therapy.

## 2019-04-12 NOTE — Telephone Encounter (Signed)
Left message informing Claib[facility] Billings that once weekly PT for nine weeks is ok per Delice Lesch.  If she needs something faxed to call the office back.

## 2019-04-12 NOTE — Telephone Encounter (Signed)
That is fine, thanks 

## 2019-04-13 NOTE — Telephone Encounter (Signed)
Left message for [facility] to return call. Needs to be schedule there due to pt having pacemaker.

## 2019-04-14 NOTE — Telephone Encounter (Signed)
Spoke to [place]. She will need to get approval from radiology to schedule pt and then once reviewed they will call pt and schedule. Sister [facility] informed. [facility] schedule is out in October.

## 2019-04-20 ENCOUNTER — Ambulatory Visit (INDEPENDENT_AMBULATORY_CARE_PROVIDER_SITE_OTHER): Payer: Medicare Other | Admitting: *Deleted

## 2019-04-20 ENCOUNTER — Other Ambulatory Visit: Payer: Self-pay | Admitting: Family Medicine

## 2019-04-20 DIAGNOSIS — Z780 Asymptomatic menopausal state: Secondary | ICD-10-CM

## 2019-04-20 DIAGNOSIS — I442 Atrioventricular block, complete: Secondary | ICD-10-CM

## 2019-04-20 LAB — CUP PACEART REMOTE DEVICE CHECK
Battery Remaining Longevity: 150 mo
Battery Remaining Percentage: 100 %
Brady Statistic RA Percent Paced: 13 %
Brady Statistic RV Percent Paced: 3 %
Date Time Interrogation Session: 20200917063100
Implantable Lead Implant Date: 20170508
Implantable Lead Implant Date: 20170508
Implantable Lead Location: 753859
Implantable Lead Location: 753860
Implantable Lead Model: 7740
Implantable Lead Model: 7741
Implantable Lead Serial Number: 1111
Implantable Lead Serial Number: 1111
Implantable Pulse Generator Implant Date: 20170508
Lead Channel Impedance Value: 605 Ohm
Lead Channel Impedance Value: 643 Ohm
Lead Channel Pacing Threshold Amplitude: 0.9 V
Lead Channel Pacing Threshold Pulse Width: 0.4 ms
Lead Channel Setting Pacing Amplitude: 2 V
Lead Channel Setting Pacing Amplitude: 5 V
Lead Channel Setting Pacing Pulse Width: 1 ms
Lead Channel Setting Sensing Sensitivity: 2.5 mV
Pulse Gen Serial Number: 747619

## 2019-04-24 ENCOUNTER — Encounter: Payer: Self-pay | Admitting: Cardiology

## 2019-04-24 ENCOUNTER — Other Ambulatory Visit: Payer: Self-pay | Admitting: Family Medicine

## 2019-04-24 DIAGNOSIS — Z1231 Encounter for screening mammogram for malignant neoplasm of breast: Secondary | ICD-10-CM

## 2019-04-24 NOTE — Progress Notes (Signed)
Remote pacemaker transmission.   

## 2019-04-25 ENCOUNTER — Ambulatory Visit: Payer: Medicare Other | Admitting: Podiatry

## 2019-05-02 ENCOUNTER — Ambulatory Visit: Payer: Medicare Other | Admitting: Gastroenterology

## 2019-05-04 DIAGNOSIS — G20 Parkinson's disease: Secondary | ICD-10-CM

## 2019-05-04 DIAGNOSIS — G20A1 Parkinson's disease without dyskinesia, without mention of fluctuations: Secondary | ICD-10-CM

## 2019-05-04 HISTORY — DX: Parkinson's disease: G20

## 2019-05-04 HISTORY — DX: Parkinson's disease without dyskinesia, without mention of fluctuations: G20.A1

## 2019-05-09 ENCOUNTER — Other Ambulatory Visit: Payer: Self-pay

## 2019-05-09 ENCOUNTER — Ambulatory Visit (HOSPITAL_COMMUNITY)
Admission: RE | Admit: 2019-05-09 | Discharge: 2019-05-09 | Disposition: A | Discharge status: Home / Self Care | Payer: Medicare Other | Source: Ambulatory Visit | Attending: Neurology | Admitting: Neurology

## 2019-05-09 DIAGNOSIS — R4182 Altered mental status, unspecified: Secondary | ICD-10-CM | POA: Diagnosis present

## 2019-05-09 LAB — CREATININE, SERUM
Creatinine, Ser: 0.77 mg/dL (ref 0.44–1.00)
GFR calc Af Amer: >60 mL/min (ref >60)
GFR calc non Af Amer: >60 mL/min (ref >60)

## 2019-05-09 MED ORDER — GADOBUTROL 1 MMOL/ML IV SOLN
7.0000 mL | Freq: Once | INTRAVENOUS | Status: AC | PRN
  Administered 2019-05-09: 7 mL via INTRAVENOUS

## 2019-05-22 ENCOUNTER — Other Ambulatory Visit: Payer: Self-pay

## 2019-05-22 ENCOUNTER — Encounter: Payer: Self-pay | Admitting: Neurology

## 2019-05-22 ENCOUNTER — Ambulatory Visit (INDEPENDENT_AMBULATORY_CARE_PROVIDER_SITE_OTHER): Payer: Medicare Other | Admitting: Neurology

## 2019-05-22 VITALS — BP 115/75 | HR 53 | Ht 59.0 in | Wt 165.0 lb

## 2019-05-22 DIAGNOSIS — Q909 Down syndrome, unspecified: Secondary | ICD-10-CM

## 2019-05-22 DIAGNOSIS — R251 Tremor, unspecified: Secondary | ICD-10-CM

## 2019-05-22 DIAGNOSIS — F0280 Dementia in other diseases classified elsewhere without behavioral disturbance: Secondary | ICD-10-CM

## 2019-05-22 DIAGNOSIS — G40211 Localization-related (focal) (partial) symptomatic epilepsy and epileptic syndromes with complex partial seizures, intractable, with status epilepticus: Secondary | ICD-10-CM

## 2019-05-22 DIAGNOSIS — I2584 Coronary atherosclerosis due to calcified coronary lesion: Secondary | ICD-10-CM | POA: Diagnosis not present

## 2019-05-22 DIAGNOSIS — I2510 Atherosclerotic heart disease of native coronary artery without angina pectoris: Secondary | ICD-10-CM

## 2019-05-22 MED ORDER — CARBIDOPA-LEVODOPA 25-100 MG PO TABS: ORAL_TABLET | ORAL | 1 refills | Status: DC

## 2019-05-22 NOTE — Progress Notes (Signed)
NEUROLOGY FOLLOW UP OFFICE NOTE  Kaitlyn Powell [id number] 1955/11/26  HISTORY OF PRESENT ILLNESS: I had the pleasure of seeing Kaitlyn Powell in follow-up in the neurology clinic on 05/22/2019.  The patient was last seen 2 months ago for intractable epilepsy. She is again accompanied by her sister Kaitlyn Powell who provides the history today.  Records and images were personally reviewed where available. MRI brain with and without contrast done 05/2019 showed severe brain atrophy, especially in the temporal lobes, confluent FLAIR hyperintensity in the deep cerebral white matter, no acute changes. She is more interactive today compared to prior video visits, smiles, says she likes the painting on the wall. Kaitlyn Powell reports that with initiation of Epidiolex, she has had less seizures, they started slowing down from daily, to every other day, then her last seizure was on 10/8 (9 days ago). Her sister cut down Keppra to 500mg  in AM, 1000mg  in PM. She is also on Dilantin 200mg  BID. Kaitlyn Powell's main concern continues to be the increasing cognitive and behavioral changes where she would suddenly be unable to do things, such as walking to the bathroom. It appears like the message does not get to her legs, she says she is moving her legs. She also has a new resting tremor, noticed today in her left thumb.   History on Initial Assessment 09/02/2018: This is a pleasant 63 year old left-handed woman with a history of Down syndrome, sleep apnea, hypothyroidism, sick sinus syndrome s/p PPM, presenting for second opinion regarding seizures. Kaitlyn Powell reports that [name]'s seizures started in her teens/early 76s, they would be walking and all of a sudden she would drop down. She would be awake and appear to lose all muscle tone, family would pick her up and she would be fine. She had been taking Dilantin for many years. Kaitlyn Powell reports a period of time where she was not having seizures, or at least they were not hearing any news from her group  home. In 2017, she became quite ill and was treated in [place] for pneumonia, CHF, and pulmonary embolism. She was on Lovenox until November 2017. There was concern for macrocytic anemia with normal B12 level possibly due to prolonged Dilantin use, she had been on Dilantin 200mg  BID. She had an infected PEG tube and had a GTC lasting 20 minutes. She was seen by neurologist Dr. Krista Blue in 2018 due to drop seizures occurring on a daily basis with loss of consciousness lasting less than a minute. She was started on Levetiracetam, however with attempt to wean down Dilantin, she had several GTCs. She was restarted on Dilantin 200mg  BID and Keppra dose was reduced. She had an MRI brain without contrast in July 2018 which showed congenital microcephaly, moderate diffuse volum loss, moderate chronic microvascular disease. Routine EEG reported moderate to severe background slowing with rare generalized spike slow waves. The daily episodes decreased in frequency and would only occur when she was on the toilet. Initially they were occurring when she was having a BM and family could visibly see her abdomen straining, however recently they have occurred while peeing and without any visible straining. She would be out for 10-15 seconds, then wake up looking like she is trying to grab something or moving her hands L>R like she is scratching her leg. She would be talking gibberish or incoherent like baby talk when she starts coming around. A few times she would black out and go backward like someone pushed her back hard. She had a 3-day ambulatory  video EEG study in November 2018. Background was diffusely slow with sharp and spike and wave discharges in the left frontotemporal region, bihemispheric, bisynchronous spike and wave discharges, isolated bursts of generalized spike and wave discharges, independent discharges in the right frontotemporal region. In general, there was a bifrontal predominance of discharges including the  presence of occasional 2-2.5 slow spike and wave discharges. There was decreased amplitude over the right temporal area. There were 6 push button events, all due to "drop seizure on toilet." With 5 events, there was note of diffuse slow background with several bursts of bilateral rhythmic delta activity/intermittent delta activity. It was noted that event #6 had generalized frontally predominant 2 Hz spike and wave activity lasting 17 seconds with some after coming slow waves. Keppra dose was increased to 2500mg  daily at one point, then dose was reduced to 1000mg  qhs. She was tried on Lamotrigine but could not tolerate 25mg  dose, "looked like she was institutionalized." On 08/01/18, Kaitlyn Powell could not wake her up in the morning, she would open her eyes but was unresponsive. Kaitlyn Powell sat her up and she started jerking for 10 minutes. They tried to stand her up after but they were dragging her like she was drunk and could not move her legs. She started waking up and family gave her medications, went back to sleep, then again had difficulty waking her up followed by upper body jerking when she was sat up. She was found to have a pneumonia. Keppra dose increased to 1000mg  BID in addition to Dilantin 200mg  BID. Family reports some days she would have 3 in one day, other times she would go 48 hours without an event. Last episode was yesterday. She is also on Risperdal 1mg  BID, family is unsure when or why this was prescribed, she is always happy and always positive.   Update 02/13/2019: Vimpat has been quite sedating to her, she is only on 25mg  qhs, higher doses caused significant drowsiness. She had a 72-hour ambulatory video EEG study from June 30-February 03, 2019 which I personally reviewed, baseline EEG was abnormal with frequent generalized spikes, independent epileptiform discharges over the left and right temporal regions. There were 3 push button events, all in the bathroom, with 2 of them she is sitting on the commode then  suddenly jerks back and appears unresponsive for a few seconds. There were no clear epileptiform discharges during these episodes but there was low voltage fast activity/muscle, different from her baseline. No EKG changes. The third episode was lethargy/decreased responsiveness with no body jerk. Her sister has also noticed that she is having more cognitive issues, having a harder time finding her words. She has noticed that she would hold her hand in a pincer position more often. She is currently drowsy after her meal, which is typical for her.   Prior AEDs: Lamotrigine, Vimpat, Topamax    PAST MEDICAL HISTORY: Past Medical History:  Diagnosis Date   Arthritis    Aspiration pneumonia (HCC)    Asthma    Cardiac arrhythmia    have pacemeaker   Down syndrome    DVT of popliteal vein (HCC) 09/08/2018   chronic   GERD (gastroesophageal reflux disease)    Heart block    Hypothyroidism    Mental retardation    Pacemaker    Pulmonary emboli (HCC)    Seizures (HCC)    Sleep apnea with use of continuous positive airway pressure (CPAP)     MEDICATIONS: Current Outpatient Medications on File Prior to Visit  Medication Sig Dispense Refill   Cannabidiol (EPIDIOLEX) 100 MG/ML SOLN Take 1.37mL at bedtime for a week, then increase to 1.48mL twice a day 100 mL 4   citalopram (CELEXA) 20 MG tablet Take 20 mg by mouth daily.     cyanocobalamin (TH VITAMIN B12) 100 MCG tablet Take 100 mcg by mouth every morning.      Elastic Bandages & Supports (GAIT/TRANSFER BELT) MISC 1 Product by Does not apply route daily. 1 each 3   folic acid (FOLVITE) 1 MG tablet Take 1 mg  by mouth  daily     furosemide (LASIX) 20 MG tablet Take 1 tablet (20 mg total) by mouth as needed for fluid or edema. 30 tablet 11   levETIRAcetam (KEPPRA) 1000 MG tablet Take 1,000 mg by mouth 2 (two) times daily. Takes one in the am and two at bedtime     levothyroxine (SYNTHROID, LEVOTHROID) 150 MCG tablet Take 150  mcg by mouth daily before breakfast.     Melatonin 5 MG TABS Take 1 tablet by mouth at bedtime.     OXYGEN Inhale 3 L into the lungs as directed. With CPAP      phenytoin (DILANTIN) 200 MG ER capsule Take 1 capsule (200 mg total) by mouth 2 (two) times daily. 180 capsule 3   polyethylene glycol powder (MIRALAX) powder Take 17 g by mouth 3 (three) times daily as needed for mild constipation. 255 g 11   risperiDONE (RISPERDAL) 1 MG tablet I tablet BID     simvastatin (ZOCOR) 40 MG tablet TAKE 40 MG BY MOUTH DAILY AT BEDTIME 90 tablet 2   XARELTO 20 MG TABS tablet TAKE 1 TABLET (20 MG TOTAL) BY MOUTH DAILY WITH SUPPER. 90 tablet 1   nitroGLYCERIN (NITROSTAT) 0.4 MG SL tablet Place 1 tablet (0.4 mg total) under the tongue every 5 (five) minutes as needed for chest pain. 25 tablet 5   No current facility-administered medications on file prior to visit.     ALLERGIES: Allergies  Allergen Reactions   Cephalexin Other (See Comments)    Seizures    Namenda [Memantine Hcl] Other (See Comments)    Made memory issues worse, sleepiness, increased confusion    FAMILY HISTORY: Family History  Problem Relation Age of Onset   Diabetes Father    Heart disease Father    Breast cancer Sister     SOCIAL HISTORY: Social History   Socioeconomic History   Marital status: Single    Spouse name: Not on file   Number of children: Not on file   Years of education: Not on file   Highest education level: Not on file  Occupational History   Occupation: disabled  Social Network engineer strain: Not on file   Food insecurity    Worry: Not on file    Inability: Not on file   Transportation needs    Medical: Not on file    Non-medical: Not on file  Tobacco Use   Smoking status: Never Smoker   Smokeless tobacco: Never Used  Substance and Sexual Activity   Alcohol use: No   Drug use: No   Sexual activity: Never  Lifestyle   Physical activity    Days per week:  Not on file    Minutes per session: Not on file   Stress: Not on file  Relationships   Social connections    Talks on phone: Not on file    Gets together: Not on file  Attends religious service: Not on file    Active member of club or organization: Not on file    Attends meetings of clubs or organizations: Not on file    Relationship status: Not on file   Intimate partner violence    Fear of current or ex partner: Not on file    Emotionally abused: Not on file    Physically abused: Not on file    Forced sexual activity: Not on file  Other Topics Concern   Not on file  Social History Narrative   Pt is left handed   Lives in 2 story home with her sister, brother-in-law and BIL's brother (pt stays on bottom floor)    REVIEW OF SYSTEMS: unable to obtain due to mental status/cognitive impairment  PHYSICAL EXAM: Vitals:   05/22/19 1425  BP: 115/75  Pulse: (!) 53   General: No acute distress, Down syndrome facies, smiling, says she likes the painting of boats on the wall. Head:  Normocephalic/atraumatic Skin/Extremities: No rash, no edema Neurological Exam: alert and oriented to person. Mild dysarthria (similar to prior). Reduced attention. Difficulty following commands. Cranial nerves: Pupils equal, round. Extraocular movements intact with no nystagmus. Blink to threat bilaterally. No facial asymmetry. Motor: +cogwheeling with increased tone bilaterally. Muscle strength 5/5 throughout with no pronator drift.Deep tendon reflexes +1 throughout.  Finger to nose testing intact (had difficulty following command, needed repeated instructions). Gait slow and cautious. +left thumb resting tremor. No postural or action tremor.  IMPRESSION: This is a pleasant 63 yo LH woman with a history of Down syndrome, sleep apnea, hypothyroidism, sick sinus syndrome s/p PPM, and seizures. Her 72-hour EEG captured episodes with semiology that was not consistent with syncope, concerning for drop attacks  with diffuse low voltage fast activity on EEG. She has been very sensitive to trials of low doses of AEDs, but appears to have a good response to low dose Epidiolex. We will continue to increase dose adjusted by weight, her sister will call once ready for refills. We also discussed MRI brain with no acute changes to explain worsening cognitive/behavioral changes, likely due to progressive early onset Alzheimer's disease in Down Syndrome. She has more signs of parkinsonism today, which may be due to Risperdal, consider weaning in the future. We have agreed to try a low dose of Sinemet to help with mobility, side effects discussed. Continue Levetiracetam  in AM,  in PM and Dilantin  BID. Follow-up in 3 months, family knows to call for any changes.   Thank you for allowing me to participate in her care.  Please do not hesitate to call for any questions or concerns.  The duration of this appointment visit was 30 minutes of face-to-face time with the patient.  Greater than 50% of this time was spent in counseling, explanation of diagnosis, planning of further management, and coordination of care.   Patrcia Dolly, M.D.   CC: Dr. Everlene Other

## 2019-05-22 NOTE — Patient Instructions (Signed)
1. Let's do a trial of Sinemet (Levodopa) 25/100mg : take 1/2 tablet three times a day with meals for a week, then increase to 1 tablet three times a day with meals  2. Continue all your current medications, call when due for increase in Epidiolex  3. Follow-up in 3-4 months, call for any changes

## 2019-05-29 ENCOUNTER — Other Ambulatory Visit: Payer: Self-pay

## 2019-05-29 ENCOUNTER — Other Ambulatory Visit: Payer: Self-pay | Admitting: Neurology

## 2019-05-30 MED ORDER — LEVETIRACETAM ER 500 MG PO TB24: ORAL_TABLET | ORAL | 3 refills | Status: DC

## 2019-06-05 ENCOUNTER — Other Ambulatory Visit: Payer: Self-pay | Admitting: Neurology

## 2019-06-05 MED ORDER — EPIDIOLEX 100 MG/ML PO SOLN: ORAL | 5 refills | Status: DC

## 2019-06-07 ENCOUNTER — Other Ambulatory Visit: Payer: Self-pay

## 2019-06-07 DIAGNOSIS — G40211 Localization-related (focal) (partial) symptomatic epilepsy and epileptic syndromes with complex partial seizures, intractable, with status epilepticus: Secondary | ICD-10-CM

## 2019-06-07 DIAGNOSIS — Z5181 Encounter for therapeutic drug level monitoring: Secondary | ICD-10-CM

## 2019-06-07 DIAGNOSIS — R569 Unspecified convulsions: Secondary | ICD-10-CM

## 2019-06-09 ENCOUNTER — Telehealth: Payer: Self-pay | Admitting: Neurology

## 2019-06-09 ENCOUNTER — Telehealth: Payer: Self-pay | Admitting: Gastroenterology

## 2019-06-09 MED ORDER — SENNA 8.8 MG/5ML PO LIQD: 10.0000 mL | Freq: Every day | ORAL | 2 refills | Status: DC

## 2019-06-09 NOTE — Telephone Encounter (Signed)
Ok to send Rx for senna, if not covered by insurance please send Rx for Miralax 1 capful daily.

## 2019-06-09 NOTE — Telephone Encounter (Signed)
Pt's sister called and requested a prescription for Senna syrup.  She reported that pt is very constipated.

## 2019-06-09 NOTE — Telephone Encounter (Signed)
Please advise 

## 2019-06-09 NOTE — Telephone Encounter (Signed)
Left message for Claiborne Billings informing that another 8 weeks of PT is fine

## 2019-06-09 NOTE — Telephone Encounter (Signed)
Kelly from [facility] called and requested and extension of PT for 1 time a week for 8 weeks.

## 2019-06-14 ENCOUNTER — Other Ambulatory Visit: Payer: Self-pay

## 2019-06-14 ENCOUNTER — Other Ambulatory Visit: Payer: Self-pay | Admitting: Neurology

## 2019-06-14 MED ORDER — EPIDIOLEX 100 MG/ML PO SOLN: ORAL | 5 refills | Status: DC

## 2019-06-15 NOTE — Telephone Encounter (Signed)
Patient's sister called and would like to talk with you regarding Kaitlyn Powell needing lab work taken at her home. Also, she needs a medication called into [facility].Please Call. She said she has been trying to get this for a couple of weeks. Thank you

## 2019-06-15 NOTE — Telephone Encounter (Signed)
Please Call [facility] with a Verbal Order to [phone]. They are needing the Verbal order for a Nursing Evaluation and Labs. She said you can lmom. Thanks

## 2019-06-16 ENCOUNTER — Encounter: Payer: Self-pay | Admitting: Neurology

## 2019-06-16 ENCOUNTER — Telehealth: Payer: Self-pay

## 2019-06-16 NOTE — Telephone Encounter (Signed)
Main number [phone] ext [phone]  If pt needs labs. Can give verbal. State needs nurse eval with labs.

## 2019-06-23 NOTE — Telephone Encounter (Signed)
-----   Message from Cameron Sprang, MD sent at 06/23/2019  8:57 AM EST ----- Regarding: labs Pls let sister know that the bloodwork showed her kideny and liver were ok, but her sodium level is low 126, and her glucose level was low 60. Looking back on her prior labs, the sodium level has slowly been going down over the past year. This should be discussed with her PCP. Thanks

## 2019-07-09 ENCOUNTER — Other Ambulatory Visit: Payer: Self-pay | Admitting: Neurology

## 2019-07-12 ENCOUNTER — Ambulatory Visit
Admission: RE | Admit: 2019-07-12 | Discharge: 2019-07-12 | Disposition: A | Discharge status: Home / Self Care | Payer: Medicare Other | Source: Ambulatory Visit | Attending: Family Medicine | Admitting: Family Medicine

## 2019-07-12 ENCOUNTER — Other Ambulatory Visit: Payer: Self-pay

## 2019-07-12 DIAGNOSIS — Z1231 Encounter for screening mammogram for malignant neoplasm of breast: Secondary | ICD-10-CM

## 2019-07-12 DIAGNOSIS — Z780 Asymptomatic menopausal state: Secondary | ICD-10-CM

## 2019-07-14 ENCOUNTER — Ambulatory Visit: Payer: Medicare Other | Admitting: Podiatry

## 2019-07-20 ENCOUNTER — Ambulatory Visit (INDEPENDENT_AMBULATORY_CARE_PROVIDER_SITE_OTHER): Payer: Medicare Other | Admitting: *Deleted

## 2019-07-20 DIAGNOSIS — I442 Atrioventricular block, complete: Secondary | ICD-10-CM

## 2019-07-20 LAB — CUP PACEART REMOTE DEVICE CHECK
Battery Remaining Longevity: 126 mo
Battery Remaining Percentage: 100 %
Brady Statistic RA Percent Paced: 14 %
Brady Statistic RV Percent Paced: 5 %
Date Time Interrogation Session: 20201217023100
Implantable Lead Implant Date: 20170508
Implantable Lead Implant Date: 20170508
Implantable Lead Location: 753859
Implantable Lead Location: 753860
Implantable Lead Model: 7740
Implantable Lead Model: 7741
Implantable Lead Serial Number: 1111
Implantable Lead Serial Number: 1111
Implantable Pulse Generator Implant Date: 20170508
Lead Channel Impedance Value: 598 Ohm
Lead Channel Impedance Value: 628 Ohm
Lead Channel Pacing Threshold Amplitude: 0.9 V
Lead Channel Pacing Threshold Pulse Width: 0.4 ms
Lead Channel Setting Pacing Amplitude: 2 V
Lead Channel Setting Pacing Amplitude: 5 V
Lead Channel Setting Pacing Pulse Width: 1 ms
Lead Channel Setting Sensing Sensitivity: 2.5 mV
Pulse Gen Serial Number: 747619

## 2019-07-21 ENCOUNTER — Other Ambulatory Visit: Payer: Self-pay | Admitting: Nurse Practitioner

## 2019-07-21 NOTE — Telephone Encounter (Signed)
Pt last saw Dr Rayann Heman 01/19/19, last labs 05/09/19 Creat 0.77, age 63, weight 74.8kg, CrCl 88.31, based on CrCl pt is on appropriate dosage of Xarelto 20mg  QD.  Will refill rx.

## 2019-08-09 ENCOUNTER — Other Ambulatory Visit: Payer: Self-pay | Admitting: Gastroenterology

## 2019-08-18 ENCOUNTER — Other Ambulatory Visit: Payer: Self-pay

## 2019-08-18 ENCOUNTER — Encounter: Payer: Self-pay | Admitting: Neurology

## 2019-08-18 ENCOUNTER — Telehealth (INDEPENDENT_AMBULATORY_CARE_PROVIDER_SITE_OTHER): Payer: Medicare Other | Admitting: Neurology

## 2019-08-18 VITALS — Wt 165.0 lb

## 2019-08-18 DIAGNOSIS — F0280 Dementia in other diseases classified elsewhere without behavioral disturbance: Secondary | ICD-10-CM | POA: Diagnosis not present

## 2019-08-18 DIAGNOSIS — G40211 Localization-related (focal) (partial) symptomatic epilepsy and epileptic syndromes with complex partial seizures, intractable, with status epilepticus: Secondary | ICD-10-CM

## 2019-08-18 DIAGNOSIS — R269 Unspecified abnormalities of gait and mobility: Secondary | ICD-10-CM | POA: Diagnosis not present

## 2019-08-18 DIAGNOSIS — Q909 Down syndrome, unspecified: Secondary | ICD-10-CM

## 2019-08-18 NOTE — Progress Notes (Signed)
Virtual Visit via Video Note The purpose of this virtual visit is to provide medical care while limiting exposure to the novel coronavirus.    Consent was obtained for video visit:  Yes.   Answered questions that patient had about telehealth interaction:  Yes.   I discussed the limitations, risks, security and privacy concerns of performing an evaluation and management service by telemedicine. I also discussed with the patient that there may be a patient responsible charge related to this service. The patient expressed understanding and agreed to proceed.  Pt location: Home Physician Location: office Name of referring provider:  Bernerd Limbo, MD I connected with Kaitlyn Powell at patients sister's initiation/request on 08/18/2019 at  2:30 PM EST by video enabled telemedicine application and verified that I am speaking with the correct person using two identifiers. Pt MRN:  [id number] Pt DOB:  May 13, 1956 Video Participants:  Kaitlyn Powell;  Kaitlyn Powell (sister)   History of Present Illness:  The patient was seen as a virtual video visit on 08/18/2019. She was last seen 3 months ago for intractable epilepsy, dementia due to Down syndrome. On her last visit, Epidiolex dose was increased, her family has been doing a slow uptitration, she is currently on 61mL BID. There has been a significant improvement in seizure frequency. She was previously having seizures on a daily basis. She did very well and had one seizure in November. Since she was doing well, family tried to reduce Keppra dose to 1 tab BID, however she started having an increase in seizures on 12/22, 12/28, 12/30, 1/1. She is back on Keppra 500mg  in AM, 1000mg  in PM and continues on Dilantin 100mg  BID. Unfortunately she is not doing well with more bad than good days. She had home PT with improved walking. She still needs 2-person assist, they have an aide coming 2 days a week for 4 hours. Her speech has declined, the first couple of words  will be okay, then becomes garbled. She has been incontinent more. She also has more constipation, she has needed a fleet enema twice. She is drowsy during the visit, sometimes she is able to stay up, they have a hard time getting her up at noon, then she naps at 2pm. Family tried to reduce Risperal to 1 tab daily but she had more balance issues. She is back on 1 tab BID. On her last visit, she was also started on Sinemet 25/100mg  TID for parkinsonism (new tremor, bradykinesia), to help with mobility. It may have helped.    I had the pleasure of seeing Kaitlyn Powell in follow-up in the neurology clinic on 64/19/2020.  The patient was last seen 2 months ago for intractable epilepsy. She is again accompanied by her sister Kaitlyn Powell who provides the history today.  Records and images were personally reviewed where available. MRI brain with and without contrast done 05/2019 showed severe brain atrophy, especially in the temporal lobes, confluent FLAIR hyperintensity in the deep cerebral white matter, no acute changes. She is more interactive today compared to prior video visits, smiles, says she likes the painting on the wall. Kaitlyn Powell reports that with initiation of Epidiolex, she has had less seizures, they started slowing down from daily, to every other day, then her last seizure was on 10/8 (9 days ago). Her sister cut down Keppra to 500mg  in AM, 1000mg  in PM. She is also on Dilantin 200mg  BID. Kaitlyn Powell's main concern continues to be the increasing cognitive and behavioral changes where she would suddenly be  unable to do things, such as walking to the bathroom. It appears like the message does not get to her legs, she says she is moving her legs. She also has a new resting tremor, noticed today in her left thumb.   History on Initial Assessment 09/02/2018: This is a pleasant 64 year old left-handed woman with a history of Down syndrome, sleep apnea, hypothyroidism, sick sinus syndrome s/p PPM, presenting for second opinion  regarding seizures. Kaitlyn Powell reports that Roschelle's seizures started in her teens/early 15s, they would be walking and all of a sudden she would drop down. She would be awake and appear to lose all muscle tone, family would pick her up and she would be fine. She had been taking Dilantin for many years. Kaitlyn Powell reports a period of time where she was not having seizures, or at least they were not hearing any news from her group home. In 2017, she became quite ill and was treated in [place] for pneumonia, CHF, and pulmonary embolism. She was on Lovenox until November 2017. There was concern for macrocytic anemia with normal B12 level possibly due to prolonged Dilantin use, she had been on Dilantin 200mg  BID. She had an infected PEG tube and had a GTC lasting 20 minutes. She was seen by neurologist Dr. in 2018 due to drop seizures occurring on a daily basis with loss of consciousness lasting less than a minute. She was started on Levetiracetam, however with attempt to wean down Dilantin, she had several GTCs. She was restarted on Dilantin 200mg  BID and Keppra dose was reduced. She had an MRI brain without contrast in July 2018 which showed congenital microcephaly, moderate diffuse volum loss, moderate chronic microvascular disease. Routine EEG reported moderate to severe background slowing with rare generalized spike slow waves. The daily episodes decreased in frequency and would only occur when she was on the toilet. Initially they were occurring when she was having a BM and family could visibly see her abdomen straining, however recently they have occurred while peeing and without any visible straining. She would be out for 10-15 seconds, then wake up looking like she is trying to grab something or moving her hands L>R like she is scratching her leg. She would be talking gibberish or incoherent like baby talk when she starts coming around. A few times she would black out and go backward like someone pushed her back  hard. She had a 3-day ambulatory video EEG study in November 2018. Background was diffusely slow with sharp and spike and wave discharges in the left frontotemporal region, bihemispheric, bisynchronous spike and wave discharges, isolated bursts of generalized spike and wave discharges, independent discharges in the right frontotemporal region. In general, there was a bifrontal predominance of discharges including the presence of occasional 2-2.5 slow spike and wave discharges. There was decreased amplitude over the right temporal area. There were 6 push button events, all due to "drop seizure on toilet." With 5 events, there was note of diffuse slow background with several bursts of bilateral rhythmic delta activity/intermittent delta activity. It was noted that event #6 had generalized frontally predominant 2 Hz spike and wave activity lasting 17 seconds with some after coming slow waves. Keppra dose was increased to 2500mg  daily at one point, then dose was reduced to 1000mg  qhs. She was tried on Lamotrigine but could not tolerate 25mg  dose, "looked like she was institutionalized." On 08/01/18, December 2018 could not wake her up in the morning, she would open her eyes but was unresponsive.  Kaitlyn Powell sat her up and she started jerking for 10 minutes. They tried to stand her up after but they were dragging her like she was drunk and could not move her legs. She started waking up and family gave her medications, went back to sleep, then again had difficulty waking her up followed by upper body jerking when she was sat up. She was found to have a pneumonia. Keppra dose increased to 1000mg  BID in addition to Dilantin 200mg  BID. Family reports some days she would have 3 in one day, other times she would go 48 hours without an event. Last episode was yesterday. She is also on Risperdal 1mg  BID, family is unsure when or why this was prescribed, she is always happy and always positive.   Update 02/13/2019: Vimpat has been quite  sedating to her, she is only on 25mg  qhs, higher doses caused significant drowsiness. She had a 72-hour ambulatory video EEG study from June 30-February 03, 2019 which I personally reviewed, baseline EEG was abnormal with frequent generalized spikes, independent epileptiform discharges over the left and right temporal regions. There were 3 push button events, all in the bathroom, with 2 of them she is sitting on the commode then suddenly jerks back and appears unresponsive for a few seconds. There were no clear epileptiform discharges during these episodes but there was low voltage fast activity/muscle, different from her baseline. No EKG changes. The third episode was lethargy/decreased responsiveness with no body jerk. Her sister has also noticed that she is having more cognitive issues, having a harder time finding her words. She has noticed that she would hold her hand in a pincer position more often. She is currently drowsy after her meal, which is typical for her.   Prior AEDs: Lamotrigine, Vimpat, Topamax   Outpatient Encounter Medications as of 08/18/2019  Medication Sig  . Cannabidiol (EPIDIOLEX) 100 MG/ML SOLN Take 3.21mL twice a day (Patient taking differently: Take 3 mLs by mouth 2 (two) times daily. Take 3.54mL twice a day)  . carbidopa-levodopa (SINEMET IR) 25-100 MG tablet Take 1 tablet by mouth 3 (three) times daily.  . citalopram (CELEXA) 20 MG tablet Take 20 mg by mouth daily.  . cyanocobalamin (TH VITAMIN B12) 100 MCG tablet Take 100 mcg by mouth every morning.   February 05, 2019 Bandages & Supports (GAIT/TRANSFER BELT) MISC 1 Product by Does not apply route daily.  . folic acid (FOLVITE) 1 MG tablet Take 1 mg  by mouth  daily  . furosemide (LASIX) 20 MG tablet Take 1 tablet (20 mg total) by mouth as needed for fluid or edema.  . levETIRAcetam (KEPPRA XR) 500 MG 24 hr tablet Take 1 tab in AM, 2 tabs in PM  . levothyroxine (SYNTHROID, LEVOTHROID) 150 MCG tablet Take 150 mcg by mouth daily before  breakfast.  . OXYGEN Inhale 3 L into the lungs as directed. With CPAP   . phenytoin (DILANTIN) 200 MG ER capsule Take 1 capsule (200 mg total) by mouth 2 (two) times daily.  . polyethylene glycol powder (MIRALAX) powder Take 17 g by mouth 3 (three) times daily as needed for mild constipation.  . risperiDONE (RISPERDAL) 1 MG tablet I tablet BID  . Sennosides (SENNA) 8.8 MG/5ML SYRP TAKE 10 MLS BY MOUTH DAILY. AS NEEDED  . simvastatin (ZOCOR) 40 MG tablet TAKE 40 MG BY MOUTH DAILY AT BEDTIME  . XARELTO 20 MG TABS tablet TAKE 1 TABLET (20 MG TOTAL) BY MOUTH DAILY WITH SUPPER.  .    .    .  No facility-administered encounter medications on file as of 08/18/2019.    Observations/Objective:   Vitals:   08/18/19 1316  Weight: 165 lb (74.8 kg)   GEN:  The patient appears older than stated age and is in NAD. She is drowsy, briefly arouses and looks around with mouth open. She does not respond to questions. Kaitlyn Powell reports some days she does not know her name. Unable to do any exam as she does not follow commands.   Assessment and Plan:   This is a pleasant 64 yo LH woman with a history of Down syndrome, sleep apnea, hypothyroidism, sick sinus syndrome s/p PPM, and seizures. Her 72-hour EEG captured episodes with semiology that was not consistent with syncope, concerning for drop attacks with diffuse low voltage fast activity on EEG. She has had a good response to Epidiolex, with significant reduction in seizures. Plan to slowly increase to 3.58mL BID. Continue current doses of Levetiracetam 500mg  in AM, 1000mg  in PM and Dilantin 200mg  BID for now. Attempt to reduce LEV dose caused a transient increase in seizures. She is having more mobility and general care issues at home, we discussed starting to look into higher level of care. We can try slowly reducing Risperdal to 1/2 tab in AM, 1 tab in PM. We also discussed increasing Sinemet 25/100mg  to 1.5 tabs TID with meals. Continue with home PT for general  debility/gait instability. Follow-up in 3 months, family knows to call for any changes.    Follow Up Instructions:   -I discussed the assessment and treatment plan with the patient's sister. The patient's sister was provided an opportunity to ask questions and all were answered. The patient's sister agreed with the plan and demonstrated an understanding of the instructions.   The patient's sister was advised to call back or seek an in-person evaluation if the symptoms worsen or if the condition fails to improve as anticipated.    , MD

## 2019-08-19 NOTE — Progress Notes (Signed)
PPM Remote  

## 2019-08-29 ENCOUNTER — Telehealth: Payer: Self-pay | Admitting: Neurology

## 2019-08-29 NOTE — Telephone Encounter (Signed)
Patient's sister left a message regarding her needing to get Reesha started back with PT. She said that [facility] has not received an order. Please Call. Thank you

## 2019-08-30 ENCOUNTER — Other Ambulatory Visit: Payer: Self-pay

## 2019-08-30 DIAGNOSIS — R2681 Unsteadiness on feet: Secondary | ICD-10-CM

## 2019-08-30 NOTE — Telephone Encounter (Signed)
Orders placed in [facility] informed

## 2019-08-30 NOTE — Telephone Encounter (Signed)
Yes pls continue PT for deconditioning, gait instability. Thanks!

## 2019-08-30 NOTE — Progress Notes (Signed)
PT for deconditioning, gait instability

## 2019-08-31 MED ORDER — LEVETIRACETAM ER 500 MG PO TB24: ORAL_TABLET | ORAL | 3 refills | Status: DC

## 2019-08-31 MED ORDER — PHENYTOIN SODIUM EXTENDED 200 MG PO CAPS: 200.0000 mg | ORAL_CAPSULE | Freq: Two times a day (BID) | ORAL | 3 refills | Status: DC

## 2019-08-31 MED ORDER — CARBIDOPA-LEVODOPA 25-100 MG PO TABS: 1.0000 | ORAL_TABLET | Freq: Three times a day (TID) | ORAL | 3 refills | Status: DC

## 2019-08-31 MED ORDER — CARBIDOPA-LEVODOPA 25-100 MG PO TABS: 1.0000 | ORAL_TABLET | Freq: Three times a day (TID) | ORAL | 1 refills | Status: DC

## 2019-09-11 ENCOUNTER — Encounter: Payer: Medicare Other | Admitting: Physician Assistant

## 2019-09-15 ENCOUNTER — Encounter: Payer: Self-pay | Admitting: Internal Medicine

## 2019-09-15 ENCOUNTER — Telehealth (INDEPENDENT_AMBULATORY_CARE_PROVIDER_SITE_OTHER): Payer: Medicare Other | Admitting: Internal Medicine

## 2019-09-15 VITALS — Ht 59.0 in | Wt 165.0 lb

## 2019-09-15 DIAGNOSIS — F0390 Unspecified dementia without behavioral disturbance: Secondary | ICD-10-CM | POA: Diagnosis not present

## 2019-09-15 DIAGNOSIS — I442 Atrioventricular block, complete: Secondary | ICD-10-CM | POA: Diagnosis not present

## 2019-09-15 DIAGNOSIS — I82531 Chronic embolism and thrombosis of right popliteal vein: Secondary | ICD-10-CM | POA: Diagnosis not present

## 2019-09-15 NOTE — Progress Notes (Signed)
Electrophysiology TeleHealth Note   Due to national recommendations of social distancing due to COVID 19, an audio/video telehealth visit is felt to be most appropriate for this patient at this time.  See MyChart message from today for the patient's consent to telehealth for [facility].   Date:  09/15/2019   ID:  [name], DOB 06-Nov-1955, MRN [id number]  Location: patient's home  Provider location:  [facility]  Evaluation Performed: Follow-up visit  PCP:  Tracey Harries, MD   Electrophysiologist:  Dr Johney Frame  Chief Complaint:  Follow up  History of Present Illness:    Ashrita Chrismer is a 64 y.o. female who presents via telehealth conferencing today. She is seen today with the help of her family.  She has declined since last being seen by me with decreased speech and depressed motor skills. She has cooler extremities, but with + pulses per brother in law [name], cath lab tech). Today, she denies symptoms of palpitations, chest pain, shortness of breath,  lower extremity edema, dizziness, presyncope, or syncope.  The patient is otherwise without complaint today.  The patient denies symptoms of fevers, chills, cough, or new SOB worrisome for COVID 19.  Past Medical History:  Diagnosis Date  . Arthritis   . Aspiration pneumonia (HCC)   . Asthma   . Cardiac arrhythmia    have pacemeaker  . Down syndrome   . DVT of popliteal vein (HCC) 09/08/2018   chronic  . GERD (gastroesophageal reflux disease)   . Heart block   . Hypothyroidism   . Mental retardation   . Pacemaker   . Pulmonary emboli (HCC)   . Seizures (HCC)   . Sleep apnea with use of continuous positive airway pressure (CPAP)     Past Surgical History:  Procedure Laterality Date  . PACEMAKER INSERTION  12/09/2015   [employer] Accolade MRI EL PPM implanted in [place] for complete heart block and syncope  . PEG TUBE PLACEMENT     removed in 2017  . TIBIA FRACTURE SURGERY Right     Current  Outpatient Medications  Medication Sig Dispense Refill  . Cannabidiol (EPIDIOLEX) 100 MG/ML SOLN Take 3.88mL twice a day 230 mL 5  . carbidopa-levodopa (SINEMET IR) 25-100 MG tablet Take 1 tablet by mouth 3 (three) times daily. Take 1.5 tablet three times a day with meals 405 tablet 3  . citalopram (CELEXA) 20 MG tablet Take 20 mg by mouth daily.    . cyanocobalamin (TH VITAMIN B12) 100 MCG tablet Take 100 mcg by mouth every morning.     [employer] Bandages & Supports (GAIT/TRANSFER BELT) MISC 1 Product by Does not apply route daily. 1 each 3  . folic acid (FOLVITE) 1 MG tablet Take 1 mg  by mouth  daily    . furosemide (LASIX) 20 MG tablet Take 1 tablet (20 mg total) by mouth as needed for fluid or edema. 30 tablet 11  . levETIRAcetam (KEPPRA XR) 500 MG 24 hr tablet Take 1 tab in AM, 2 tabs in PM 270 tablet 3  . levothyroxine (SYNTHROID, LEVOTHROID) 150 MCG tablet Take 150 mcg by mouth daily before breakfast.    . OXYGEN Inhale 3 L into the lungs as directed. With CPAP     . phenytoin (DILANTIN) 200 MG ER capsule Take 1 capsule (200 mg total) by mouth 2 (two) times daily. 180 capsule 3  . polyethylene glycol powder (MIRALAX) powder Take 17 g by mouth 3 (three) times daily as  needed for mild constipation. 255 g 11  . risperiDONE (RISPERDAL) 1 MG tablet I tablet BID    . Sennosides (SENNA) 8.8 MG/5ML SYRP TAKE 10 MLS BY MOUTH DAILY. AS NEEDED 237 mL 1  . simvastatin (ZOCOR) 40 MG tablet TAKE 40 MG BY MOUTH DAILY AT BEDTIME 90 tablet 2  . XARELTO 20 MG TABS tablet TAKE 1 TABLET (20 MG TOTAL) BY MOUTH DAILY WITH SUPPER. 90 tablet 1   No current facility-administered medications for this visit.    Allergies:   Cephalexin and Namenda [memantine hcl]   Social History:  The patient  reports that she has never smoked. She has never used smokeless tobacco. She reports that she does not drink alcohol or use drugs.   Family History:  The patient's family history includes Breast cancer in her sister;  Diabetes in her father; Heart disease in her father.   ROS:  Please see the history of present illness.   All other systems are personally reviewed and negative.    Exam:    Vital Signs:  Ht 4\' 11"  (1.499 m)   Wt 165 lb (74.8 kg)   BMI 33.33 kg/m   Frail appearing, sleeping, seems to rouse  Labs/Other Tests and Data Reviewed:    Recent Labs: 09/21/2018: Pro B Natriuretic peptide (BNP) 86.0 12/28/2018: BUN 15; Potassium 4.9; Sodium 129 04/05/2019: ALT 12 04/07/2019: Hemoglobin CANCELED; Platelets CANCELED; TSH 3.910 05/09/2019: Creatinine, Ser 0.77   Wt Readings from Last 3 Encounters:  09/15/19 165 lb (74.8 kg)  08/18/19 165 lb (74.8 kg)  05/22/19 165 lb (74.8 kg)     Last device remote is reviewed from [place] PDF which reveals normal device function  ASSESSMENT & PLAN:    1.  Symptomatic complete heart block Normal pacemaker function by recent remote See Pace[employer] report  2.  Dementia/ Parkinsons Declining per family Followed by Dr Delice Lesch  3.  Prior DVT/PE Continue Xarelto long term   Follow-up:  Remotes,me in person as needed    Patient Risk:  after full review of this patients clinical status, I feel that they are at moderate risk at this time.  Today, I have spent 15 minutes with the patient with telehealth technology discussing arrhythmia management .    Army Fossa, MD  09/15/2019 3:42 PM     [address] [address] [phone] (office) [phone] (fax)

## 2019-09-21 ENCOUNTER — Emergency Department (HOSPITAL_COMMUNITY): Payer: Medicare Other

## 2019-09-21 ENCOUNTER — Encounter (HOSPITAL_COMMUNITY): Payer: Self-pay

## 2019-09-21 ENCOUNTER — Inpatient Hospital Stay (HOSPITAL_COMMUNITY)
Admission: EM | Admit: 2019-09-21 | Discharge: 2019-10-12 | DRG: 177 | Disposition: A | Discharge status: Home Health | Payer: Medicare Other | Attending: Internal Medicine | Admitting: Internal Medicine

## 2019-09-21 DIAGNOSIS — Z833 Family history of diabetes mellitus: Secondary | ICD-10-CM

## 2019-09-21 DIAGNOSIS — G4733 Obstructive sleep apnea (adult) (pediatric): Secondary | ICD-10-CM | POA: Diagnosis present

## 2019-09-21 DIAGNOSIS — R0600 Dyspnea, unspecified: Secondary | ICD-10-CM

## 2019-09-21 DIAGNOSIS — Z7901 Long term (current) use of anticoagulants: Secondary | ICD-10-CM

## 2019-09-21 DIAGNOSIS — Z931 Gastrostomy status: Secondary | ICD-10-CM

## 2019-09-21 DIAGNOSIS — E785 Hyperlipidemia, unspecified: Secondary | ICD-10-CM | POA: Diagnosis present

## 2019-09-21 DIAGNOSIS — Z803 Family history of malignant neoplasm of breast: Secondary | ICD-10-CM

## 2019-09-21 DIAGNOSIS — J690 Pneumonitis due to inhalation of food and vomit: Principal | ICD-10-CM | POA: Diagnosis present

## 2019-09-21 DIAGNOSIS — G20 Parkinson's disease: Secondary | ICD-10-CM | POA: Diagnosis present

## 2019-09-21 DIAGNOSIS — E871 Hypo-osmolality and hyponatremia: Secondary | ICD-10-CM | POA: Diagnosis present

## 2019-09-21 DIAGNOSIS — Q909 Down syndrome, unspecified: Secondary | ICD-10-CM

## 2019-09-21 DIAGNOSIS — E440 Moderate protein-calorie malnutrition: Secondary | ICD-10-CM | POA: Diagnosis present

## 2019-09-21 DIAGNOSIS — K5900 Constipation, unspecified: Secondary | ICD-10-CM | POA: Diagnosis present

## 2019-09-21 DIAGNOSIS — J180 Bronchopneumonia, unspecified organism: Secondary | ICD-10-CM

## 2019-09-21 DIAGNOSIS — K219 Gastro-esophageal reflux disease without esophagitis: Secondary | ICD-10-CM | POA: Diagnosis present

## 2019-09-21 DIAGNOSIS — E876 Hypokalemia: Secondary | ICD-10-CM | POA: Diagnosis not present

## 2019-09-21 DIAGNOSIS — Z20822 Contact with and (suspected) exposure to covid-19: Secondary | ICD-10-CM | POA: Diagnosis present

## 2019-09-21 DIAGNOSIS — Z950 Presence of cardiac pacemaker: Secondary | ICD-10-CM

## 2019-09-21 DIAGNOSIS — R1310 Dysphagia, unspecified: Secondary | ICD-10-CM

## 2019-09-21 DIAGNOSIS — M1990 Unspecified osteoarthritis, unspecified site: Secondary | ICD-10-CM | POA: Diagnosis present

## 2019-09-21 DIAGNOSIS — Z820 Family history of epilepsy and other diseases of the nervous system: Secondary | ICD-10-CM

## 2019-09-21 DIAGNOSIS — J90 Pleural effusion, not elsewhere classified: Secondary | ICD-10-CM | POA: Diagnosis present

## 2019-09-21 DIAGNOSIS — S22000A Wedge compression fracture of unspecified thoracic vertebra, initial encounter for closed fracture: Secondary | ICD-10-CM

## 2019-09-21 DIAGNOSIS — Z79890 Hormone replacement therapy (postmenopausal): Secondary | ICD-10-CM

## 2019-09-21 DIAGNOSIS — G309 Alzheimer's disease, unspecified: Secondary | ICD-10-CM | POA: Diagnosis present

## 2019-09-21 DIAGNOSIS — J45909 Unspecified asthma, uncomplicated: Secondary | ICD-10-CM | POA: Diagnosis present

## 2019-09-21 DIAGNOSIS — Z7189 Other specified counseling: Secondary | ICD-10-CM

## 2019-09-21 DIAGNOSIS — Z86718 Personal history of other venous thrombosis and embolism: Secondary | ICD-10-CM

## 2019-09-21 DIAGNOSIS — Z0489 Encounter for examination and observation for other specified reasons: Secondary | ICD-10-CM

## 2019-09-21 DIAGNOSIS — Z79899 Other long term (current) drug therapy: Secondary | ICD-10-CM

## 2019-09-21 DIAGNOSIS — F0280 Dementia in other diseases classified elsewhere without behavioral disturbance: Secondary | ICD-10-CM | POA: Diagnosis present

## 2019-09-21 DIAGNOSIS — R0902 Hypoxemia: Secondary | ICD-10-CM

## 2019-09-21 DIAGNOSIS — M8008XA Age-related osteoporosis with current pathological fracture, vertebra(e), initial encounter for fracture: Secondary | ICD-10-CM | POA: Diagnosis present

## 2019-09-21 DIAGNOSIS — Z8249 Family history of ischemic heart disease and other diseases of the circulatory system: Secondary | ICD-10-CM

## 2019-09-21 DIAGNOSIS — Z881 Allergy status to other antibiotic agents status: Secondary | ICD-10-CM

## 2019-09-21 DIAGNOSIS — I442 Atrioventricular block, complete: Secondary | ICD-10-CM | POA: Diagnosis present

## 2019-09-21 DIAGNOSIS — Z86711 Personal history of pulmonary embolism: Secondary | ICD-10-CM

## 2019-09-21 DIAGNOSIS — G40909 Epilepsy, unspecified, not intractable, without status epilepticus: Secondary | ICD-10-CM | POA: Diagnosis present

## 2019-09-21 DIAGNOSIS — D539 Nutritional anemia, unspecified: Secondary | ICD-10-CM | POA: Diagnosis present

## 2019-09-21 DIAGNOSIS — I959 Hypotension, unspecified: Secondary | ICD-10-CM | POA: Diagnosis not present

## 2019-09-21 DIAGNOSIS — N179 Acute kidney failure, unspecified: Secondary | ICD-10-CM | POA: Diagnosis present

## 2019-09-21 DIAGNOSIS — Z431 Encounter for attention to gastrostomy: Secondary | ICD-10-CM

## 2019-09-21 DIAGNOSIS — Z8701 Personal history of pneumonia (recurrent): Secondary | ICD-10-CM

## 2019-09-21 DIAGNOSIS — Z515 Encounter for palliative care: Secondary | ICD-10-CM

## 2019-09-21 DIAGNOSIS — Z9981 Dependence on supplemental oxygen: Secondary | ICD-10-CM

## 2019-09-21 DIAGNOSIS — J9601 Acute respiratory failure with hypoxia: Secondary | ICD-10-CM

## 2019-09-21 DIAGNOSIS — Z888 Allergy status to other drugs, medicaments and biological substances status: Secondary | ICD-10-CM

## 2019-09-21 DIAGNOSIS — IMO0002 Reserved for concepts with insufficient information to code with codable children: Secondary | ICD-10-CM

## 2019-09-21 DIAGNOSIS — Z6838 Body mass index (BMI) 38.0-38.9, adult: Secondary | ICD-10-CM

## 2019-09-21 DIAGNOSIS — T17908D Unspecified foreign body in respiratory tract, part unspecified causing other injury, subsequent encounter: Secondary | ICD-10-CM

## 2019-09-21 DIAGNOSIS — E039 Hypothyroidism, unspecified: Secondary | ICD-10-CM | POA: Diagnosis present

## 2019-09-21 DIAGNOSIS — A419 Sepsis, unspecified organism: Secondary | ICD-10-CM

## 2019-09-21 LAB — BASIC METABOLIC PANEL
Anion gap: 11 (ref 5–15)
BUN: 16 mg/dL (ref 8–23)
CO2: 24 mmol/L (ref 22–32)
Calcium: 8.7 mg/dL — ABNORMAL LOW (ref 8.9–10.3)
Chloride: 94 mmol/L — ABNORMAL LOW (ref 98–111)
Creatinine, Ser: 0.9 mg/dL (ref 0.44–1.00)
GFR calc Af Amer: >60 mL/min (ref >60)
GFR calc non Af Amer: >60 mL/min (ref >60)
Glucose, Bld: 120 mg/dL — ABNORMAL HIGH (ref 70–99)
Potassium: 4.3 mmol/L (ref 3.5–5.1)
Sodium: 129 mmol/L — ABNORMAL LOW (ref 135–145)

## 2019-09-21 LAB — POCT I-STAT EG7
Acid-Base Excess: 1 mmol/L (ref 0.0–2.0)
Bicarbonate: 25.8 mmol/L (ref 20.0–28.0)
Calcium, Ion: 1.08 mmol/L — ABNORMAL LOW (ref 1.15–1.40)
HCT: 35 % — ABNORMAL LOW (ref 36.0–46.0)
Hemoglobin: 11.9 g/dL — ABNORMAL LOW (ref 12.0–15.0)
O2 Saturation: 82 %
Potassium: 4.2 mmol/L (ref 3.5–5.1)
Sodium: 127 mmol/L — ABNORMAL LOW (ref 135–145)
TCO2: 27 mmol/L (ref 22–32)
pCO2, Ven: 40.3 mmHg — ABNORMAL LOW (ref 44.0–60.0)
pH, Ven: 7.413 (ref 7.250–7.430)
pO2, Ven: 46 mmHg — ABNORMAL HIGH (ref 32.0–45.0)

## 2019-09-21 LAB — CBC
HCT: 34.4 % — ABNORMAL LOW (ref 36.0–46.0)
Hemoglobin: 11.7 g/dL — ABNORMAL LOW (ref 12.0–15.0)
MCH: 34.8 pg — ABNORMAL HIGH (ref 26.0–34.0)
MCHC: 34 g/dL (ref 30.0–36.0)
MCV: 102.4 fL — ABNORMAL HIGH (ref 80.0–100.0)
Platelets: UNDETERMINED 10*3/uL (ref 150–400)
RBC: 3.36 MIL/uL — ABNORMAL LOW (ref 3.87–5.11)
RDW: 12.4 % (ref 11.5–15.5)
WBC: 7.9 10*3/uL (ref 4.0–10.5)
nRBC: 0 % (ref 0.0–0.2)

## 2019-09-21 LAB — LACTIC ACID, PLASMA
Lactic Acid, Venous: 1.7 mmol/L (ref 0.5–1.9)
Lactic Acid, Venous: 1 mmol/L (ref 0.5–1.9)

## 2019-09-21 LAB — RESPIRATORY PANEL BY RT PCR (FLU A&B, COVID)
Influenza A by PCR: NEGATIVE
Influenza B by PCR: NEGATIVE
SARS Coronavirus 2 by RT PCR: NEGATIVE

## 2019-09-21 LAB — TROPONIN I (HIGH SENSITIVITY)
Troponin I (High Sensitivity): 11 ng/L (ref <18)
Troponin I (High Sensitivity): 18 ng/L — ABNORMAL HIGH (ref <18)

## 2019-09-21 LAB — BRAIN NATRIURETIC PEPTIDE: B Natriuretic Peptide: 107.1 pg/mL — ABNORMAL HIGH (ref 0.0–100.0)

## 2019-09-21 MED ORDER — VANCOMYCIN HCL 1500 MG/300ML IV SOLN
1500.0000 mg | Freq: Once | INTRAVENOUS | Status: AC
  Administered 2019-09-22: 01:00:00 1500 mg via INTRAVENOUS
  Filled 2019-09-21: qty 300

## 2019-09-21 MED ORDER — PIPERACILLIN-TAZOBACTAM 3.375 G IVPB 30 MIN
3.3750 g | Freq: Once | INTRAVENOUS | Status: AC
  Administered 2019-09-22: 3.375 g via INTRAVENOUS
  Filled 2019-09-21: qty 50

## 2019-09-21 MED ORDER — SODIUM CHLORIDE 0.9 % IV BOLUS
1000.0000 mL | Freq: Once | INTRAVENOUS | Status: AC
  Administered 2019-09-22: 1000 mL via INTRAVENOUS

## 2019-09-21 MED ORDER — PIPERACILLIN-TAZOBACTAM 3.375 G IVPB
3.3750 g | Freq: Three times a day (TID) | INTRAVENOUS | Status: DC
  Administered 2019-09-22: 08:00:00 3.375 g via INTRAVENOUS
  Filled 2019-09-21: qty 50

## 2019-09-21 MED ORDER — VANCOMYCIN HCL 1250 MG/250ML IV SOLN: 1250.0000 mg | INTRAVENOUS | Status: DC

## 2019-09-21 MED ORDER — ACETAMINOPHEN 650 MG RE SUPP
650.0000 mg | Freq: Once | RECTAL | Status: AC
  Administered 2019-09-22: 650 mg via RECTAL
  Filled 2019-09-21: qty 1

## 2019-09-21 NOTE — Progress Notes (Addendum)
Pharmacy Antibiotic Note  Kaitlyn Powell is a 64 y.o. female admitted on 09/21/2019 with pneumonia.  Pharmacy has been consulted for Vancomycin and zosyn dosing.  Plan: Zosyn 3.375g IV q8h (4 hour infusion).  Vancomycin 1500 mg x 1 then 1250 mg IV q24 hours F/u renal function,cultures and clinical course     Temp (24hrs), Avg:100.6 F (38.1 C), Min:98.5 F (36.9 C), Max:102.6 F (39.2 C)  Recent Labs  Lab 09/21/19 2011 09/21/19 2012  WBC 7.9  --   CREATININE 0.90  --   LATICACIDVEN  --  1.7    Estimated Creatinine Clearance: 56.4 mL/min (by C-G formula based on SCr of 0.9 mg/dL).    Allergies  Allergen Reactions  . Cephalexin Other (See Comments)    Seizures   . Namenda [Memantine Hcl] Other (See Comments)    Made memory issues worse, sleepiness, increased confusion     Thank you for allowing pharmacy to be a part of this patient's care.  Kaitlyn Powell 09/21/2019 11:50 PM   Addum:  She was on xarelto PTA.  Will start lovenox 70 mg sq q12 hr in am.

## 2019-09-21 NOTE — ED Provider Notes (Signed)
[facility] EMERGENCY DEPARTMENT Provider Note   CSN: [id number] Arrival date & time: 09/21/19  2000     History Chief Complaint  Patient presents with  . Respiratory Distress    Kaitlyn Powell is a 64 y.o. female with a history of DVT on Xarelto, Down syndrome, sleep apnea with use of CPAP at night, seizures on Dilantin, mental retardation, presenting to the emergency department with hypoxia and respiratory distress.  EMS was called to the house today the patient was noted to be in respiratory distress.  The patient sister at bedside who lives with her reports the patient woke up feeling okay but became increasingly short of breath throughout the day.  EMS reports the patient saturation initially was 70% when they arrived, simply working to breathe.  They placed her on CPAP and noted that while transitioning her to CPAP, her sats dropped into the 60s.  Her sats are maintained in the high 60s to 70s on CPAP.  Patient herself is minimally verbal and due to intellectual disability cannot provide a history.  However her sister tells me that she has been compliant with her Xarelto for blood clot in her legs.  Her sister also tells me the patient has a history of aspiration and is concerned that she may have a pneumonia.  HPI     Past Medical History:  Diagnosis Date  . Arthritis   . Aspiration pneumonia (HCC)   . Asthma   . Cardiac arrhythmia    have pacemeaker  . Down syndrome   . DVT of popliteal vein (HCC) 09/08/2018   chronic  . GERD (gastroesophageal reflux disease)   . Heart block   . Hypothyroidism   . Mental retardation   . Pacemaker   . Pulmonary emboli (HCC)   . Seizures (HCC)   . Sleep apnea with use of continuous positive airway pressure (CPAP)     Patient Active Problem List   Diagnosis Date Noted  . Cellulitis of left hand 04/04/2019  . Left hand pain 04/04/2019  . Heart block AV complete (HCC) 01/19/2019  . Shortness of breath 01/19/2019    . Chronic deep vein thrombosis (DVT) of right popliteal vein (HCC) 09/28/2018  . 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 Aspiration pneumonia (HCC) 08/01/2018  . Dehydration 08/01/2018  . Toxic metabolic encephalopathy 08/01/2018  . Functional urinary incontinence 07/22/2017  . OSA treated with BiPAP 12/31/2016  . Mixed conductive and sensorineural hearing loss of both ears 08/05/2016  . Seizures (HCC) 07/03/2016  . Bilateral hearing loss 07/03/2016  . Down syndrome 05/10/2016  . Anemia due to medication 05/10/2016  . Acquired hypothyroidism 05/10/2016  . Elevated lipase 05/10/2016  . History of pulmonary embolism 05/10/2016  . G tube feedings (HCC) 05/10/2016  . Pacemaker 05/10/2016  . S/P percutaneous endoscopic gastrostomy (PEG) tube placement (HCC) 05/10/2016    Past Surgical History:  Procedure Laterality Date  . PACEMAKER INSERTION  12/09/2015   [employer] Accolade MRI EL PPM implanted in PA for complete heart block and syncope  . PEG TUBE PLACEMENT     removed in 2017  . TIBIA FRACTURE SURGERY Right      OB History   No obstetric history on file.     Family History  Problem Relation Age of Onset  . Diabetes Father   . Heart disease Father   . Breast cancer Sister     Social  History   Tobacco Use  . Smoking status: Never Smoker  . Smokeless tobacco: Never Used  Substance Use Topics  . Alcohol use: No  . Drug use: No    Home Medications Prior to Admission medications   Medication Sig Start Date End Date Taking? Authorizing Provider  Cannabidiol (EPIDIOLEX) 100 MG/ML SOLN Take 3.16mL twice a day 06/14/19   Cameron Sprang, MD  carbidopa-levodopa (SINEMET IR) 25-100 MG tablet Take 1 tablet by mouth 3 (three) times daily. Take 1.5 tablet three times a day with meals 08/31/19   Cameron Sprang, MD  citalopram (CELEXA) 20 MG  tablet Take 20 mg by mouth daily.    [provider]  cyanocobalamin (TH VITAMIN B12) 100 MCG tablet Take 100 mcg by mouth every morning.     [provider]  Elastic Bandages & Supports (GAIT/TRANSFER BELT) MISC 1 Product by Does not apply route daily. 04/03/19   Cameron Sprang, MD  folic acid (FOLVITE) 1 MG tablet Take 1 mg  by mouth  daily    [provider]  furosemide (LASIX) 20 MG tablet Take 1 tablet (20 mg total) by mouth as needed for fluid or edema. 12/28/18   Leanor Kail, PA  levETIRAcetam (KEPPRA XR) 500 MG 24 hr tablet Take 1 tab in AM, 2 tabs in PM 08/31/19   Cameron Sprang, MD  levothyroxine (SYNTHROID, LEVOTHROID) 150 MCG tablet Take 150 mcg by mouth daily before breakfast.    [provider]  OXYGEN Inhale 3 L into the lungs as directed. With CPAP     [provider]  phenytoin (DILANTIN) 200 MG ER capsule Take 1 capsule (200 mg total) by mouth 2 (two) times daily. 08/31/19   Cameron Sprang, MD  polyethylene glycol powder [facility]) powder Take 17 g by mouth 3 (three) times daily as needed for mild constipation. 11/01/18   Mauri Pole, MD  risperiDONE (RISPERDAL) 1 MG tablet I tablet BID    [provider]  Sennosides (SENNA) 8.8 MG/5ML SYRP TAKE 10 MLS BY MOUTH DAILY. AS NEEDED Patient taking differently: Take 10 mLs by mouth daily as needed (for constipation).  08/09/19   Mauri Pole, MD  simvastatin (ZOCOR) 40 MG tablet TAKE 40 MG BY MOUTH DAILY AT BEDTIME 03/28/19   Allred, Jeneen Rinks, MD  XARELTO 20 MG TABS tablet TAKE 1 TABLET (20 MG TOTAL) BY MOUTH DAILY WITH SUPPER. 07/21/19   Allred, Jeneen Rinks, MD    Allergies    Cephalexin and Namenda [memantine hcl]  Review of Systems   Review of Systems  Unable to perform ROS: Patient nonverbal    Physical Exam Updated Vital Signs BP (!) 98/40   Pulse 82   Temp (!) 102.6 F (39.2 C) (Rectal)   Resp (!) 24   SpO2 100%   Physical Exam Vitals and nursing note  reviewed.  Constitutional:      Appearance: She is well-developed.     Comments: Short stature, thick neck  HENT:     Head: Normocephalic and atraumatic.  Eyes:     Conjunctiva/sclera: Conjunctivae normal.  Cardiovascular:     Rate and Rhythm: Normal rate and regular rhythm.     Heart sounds: No murmur.  Pulmonary:     Comments: Satting 97% on bipap here 14/7 Original sat 65% when transitioned off cpap  Skin:    General: Skin is warm and dry.  Neurological:     General: No focal deficit present.  Mental Status: She is alert. Mental status is at baseline.     ED Results / Procedures / Treatments   Labs (all labs ordered are listed, but only abnormal results are displayed) Labs Reviewed  CBC - Abnormal; Notable for the following components:      Result Value   RBC 3.36 (*)    Hemoglobin 11.7 (*)    HCT 34.4 (*)    MCV 102.4 (*)    MCH 34.8 (*)    All other components within normal limits  BASIC METABOLIC PANEL - Abnormal; Notable for the following components:   Sodium 129 (*)    Chloride 94 (*)    Glucose, Bld 120 (*)    Calcium 8.7 (*)    All other components within normal limits  BRAIN NATRIURETIC PEPTIDE - Abnormal; Notable for the following components:   B Natriuretic Peptide 107.1 (*)    All other components within normal limits  POCT I-STAT EG7 - Abnormal; Notable for the following components:   pCO2, Ven 40.3 (*)    pO2, Ven 46.0 (*)    Sodium 127 (*)    Calcium, Ion 1.08 (*)    HCT 35.0 (*)    Hemoglobin 11.9 (*)    All other components within normal limits  TROPONIN I (HIGH SENSITIVITY) - Abnormal; Notable for the following components:   Troponin I (High Sensitivity) 18 (*)    All other components within normal limits  RESPIRATORY PANEL BY RT PCR (FLU A&B, COVID)  LACTIC ACID, PLASMA  LACTIC ACID, PLASMA  HIV ANTIBODY (ROUTINE TESTING W REFLEX)  MAGNESIUM  CBC WITH DIFFERENTIAL/PLATELET  COMPREHENSIVE METABOLIC PANEL  I-STAT VENOUS BLOOD GAS, ED   TROPONIN I (HIGH SENSITIVITY)    EKG EKG Interpretation  Date/Time:  Thursday September 21 2019 20:33:33 EST Ventricular Rate:  54 PR Interval:    QRS Duration: 118 QT Interval:  438 QTC Calculation: 419 R Axis:   62 Text Interpretation: Sinus or ectopic atrial rhythm Short PR interval Nonspecific intraventricular conduction delay Borderline repolarization abnormality No STEMI Confirmed by Alvester Chou [phone]) on 09/21/2019 11:31:55 PM   Radiology CT Chest Wo Contrast  Result Date: 09/21/2019 CLINICAL DATA:  Chest pain and shortness of breath. Pleurisy or effusion suspected. Symptoms began today. EXAM: CT CHEST WITHOUT CONTRAST TECHNIQUE: Multidetector CT imaging of the chest was performed following the standard protocol without IV contrast. COMPARISON:  Chest radiography same day.  Chest CT 12/23/2018 FINDINGS: Cardiovascular: Heart size is normal. Pacemaker in place. Left internal jugular central line passes through a left SVC with its tip in the left atrium. There is aortic atherosclerotic calcification. Coronary artery calcification is noted. Mediastinum/Nodes: No mediastinal or hilar mass or adenopathy seen. Lungs/Pleura: Small amount of pleural fluid layering dependently. Consolidation/pneumonia in both dependent lungs, lower lobe predominant. Areas of patchy perihilar infiltrate also present. Upper Abdomen: Negative Musculoskeletal: Partial compression fractures at T1, T3 and T5. Compression fracture at T12 with near vertebra plana. Upper fractures are stable. The T12 fracture has progressed somewhat since the study of May 2020. IMPRESSION: Bilateral lower lobe consolidation and collapse consistent with extensive bronchopneumonia. Very small amount of layering pleural fluid. Aortic Atherosclerosis (ICD10-I70.0). Electronically Signed   By: Paulina Fusi M.D.   On: 09/21/2019 22:46   DG Chest Portable 1 View  Result Date: 09/21/2019 CLINICAL DATA:  64 year old female with respiratory  distress. Status post central line placement. EXAM: PORTABLE CHEST 1 VIEW COMPARISON:  Chest radiograph dated 12/19/2018 and CT dated 12/23/2018. FINDINGS: Left  IJ central venous line extends straight down with tip over the heart. This is likely within a left-sided SVC variant anatomy. Correlation with clinical exam is recommended to exclude arterial blood. On cardiac CT of 01/03/2019 a left-sided SVC variant anatomy is present. Bibasilar patchy densities may represent atelectasis or infiltrate. Small bilateral pleural effusions may be present. No pneumothorax. The cardiac silhouette is within normal limits. Atherosclerotic calcification of the aortic arch. Right-sided dual lead pacemaker device. Old left clavicular fracture. No acute osseous pathology. IMPRESSION: 1. Left IJ central venous line with tip likely in the region of the right atrium via a left-sided SVC variant anatomy. Clinical correlation is recommended. No pneumothorax. 2. Bibasilar atelectasis or infiltrate. Electronically Signed   By: Elgie Collard M.D.   On: 09/21/2019 21:55    Procedures .Critical Care Performed by: Terald Sleeper, MD Authorized by: Terald Sleeper, MD   Critical care provider statement:    Critical care time (minutes):  55   Critical care was necessary to treat or prevent imminent or life-threatening deterioration of the following conditions:  Respiratory failure   Critical care was time spent personally by me on the following activities:  Discussions with consultants, evaluation of patient's response to treatment, examination of patient, ordering and performing treatments and interventions, ordering and review of laboratory studies, ordering and review of radiographic studies, pulse oximetry, re-evaluation of patient's condition, obtaining history from patient or surrogate and review of old charts Comments:     Hypoxia requiring bipap, blood gas, frequent bedside reassessment .Central Line  Date/Time:  09/21/2019 11:44 PM Performed by: Terald Sleeper, MD Authorized by: Terald Sleeper, MD   Consent:    Consent obtained:  Verbal   Consent given by:  Guardian   Risks discussed:  Incorrect placement, infection, arterial puncture, bleeding and pneumothorax   Alternatives discussed:  No treatment Pre-procedure details:    Hand hygiene: Hand hygiene performed prior to insertion     Sterile barrier technique: All elements of maximal sterile technique followed     Skin preparation:  2% chlorhexidine   Skin preparation agent: Skin preparation agent completely dried prior to procedure   Anesthesia (see MAR for exact dosages):    Anesthesia method:  Local infiltration   Local anesthetic:  Lidocaine 1% w/o epi Procedure details:    Location:  L internal jugular   Site selection rationale:  Patient preferentially head turning to right side, nonverbal and not compliant, this was felt to be the best positioning for successful palcement   Patient position:  Flat   Procedural supplies:  Triple lumen   Catheter size:  7 Fr   Landmarks identified: yes     Ultrasound guidance: yes     Sterile ultrasound techniques: Sterile gel and sterile probe covers were used     Number of attempts:  1   Successful placement: yes   Post-procedure details:    Post-procedure:  Dressing applied and line sutured   Assessment:  Blood return through all ports, no pneumothorax on x-ray, placement verified by x-ray and free fluid flow   Patient tolerance of procedure:  Tolerated well, no immediate complications   (including critical care time)  Medications Ordered in ED Medications  vancomycin (VANCOREADY) IVPB 1500 mg/300 mL (has no administration in time range)  piperacillin-tazobactam (ZOSYN) IVPB 3.375 g (has no administration in time range)  vancomycin (VANCOREADY) IVPB 1250 mg/250 mL (has no administration in time range)  lactated ringers bolus 1,500 mL (1,500 mLs Intravenous New  Bag/Given 09/22/19 0011)    acetaminophen (TYLENOL) tablet 650 mg (has no administration in time range)    Or  acetaminophen (TYLENOL) suppository 650 mg (has no administration in time range)  senna-docusate (Senokot-S) tablet 1 tablet (has no administration in time range)  Cannabidiol SOLN 370 mg (has no administration in time range)  carbidopa-levodopa (SINEMET IR) 25-100 MG per tablet immediate release 1 tablet (has no administration in time range)  citalopram (CELEXA) tablet 20 mg (has no administration in time range)  vitamin B-12 (CYANOCOBALAMIN) tablet 100 mcg (has no administration in time range)  levETIRAcetam (KEPPRA XR) 24 hr tablet 1,000 mg (has no administration in time range)  levothyroxine (SYNTHROID) tablet 150 mcg (has no administration in time range)  phenytoin (DILANTIN) ER capsule 200 mg (has no administration in time range)  risperiDONE (RISPERDAL) tablet 1 mg (has no administration in time range)  rivaroxaban (XARELTO) tablet 20 mg (has no administration in time range)  simvastatin (ZOCOR) tablet 40 mg (has no administration in time range)  sodium chloride 0.9 % bolus 1,000 mL (1,000 mLs Intravenous New Bag/Given 09/22/19 0004)  piperacillin-tazobactam (ZOSYN) IVPB 3.375 g (3.375 g Intravenous New Bag/Given 09/22/19 0006)  acetaminophen (TYLENOL) suppository 650 mg (650 mg Rectal Given 09/22/19 0000)    ED Course  I have reviewed the triage vital signs and the nursing notes.  Pertinent labs & imaging results that were available during my care of the patient were reviewed by me and considered in my medical decision making (see chart for details).  64 yo female w/ Down syndrome presenting to ED with acute hypoxic respiratory failure requiring bipap on arrival.  She was also noted to be febrile on arrival.  Suspected this is infectious or related to aspiration PNA.  COVID PCR and Flu were negative.    After multiple attempts to place peripheral IV's, the decision was made to proceed with central line  for venous access.  This was needed for venous access because we could not get access anywhere else.  This was discussed with her sister at bedside who is her home caretaker, who agreed and consented verbally.  The line was placed in the left IJ, see procedure note.  The patient subsequently had CT scan performed, and was successfully weaned down to 10L [place] maintaining her sats and respiratory rate.  With her initial trop 11 and BNP only 107, I felt this was much less likely a PE.  A large PE causing this level of hypoxia would almost certainly have caused a heart strain, and would not have improved so quickly during her stay in the ED without intervention.    Likewise I had a lower suspicion for ACS with her ecg and troponin results here.  Her co2 was 40.3 on arrival.  No hx of COPD, no wheezing - I did not feel this was a COPD exacerbation.   Clinical Course as of Sep 22 43  Thu Sep 21, 2019  2131 Central line placed left IJ, awaiting xray, then CT lungs to evaluate for possible aspiration   [MT]  2224 SARS Coronavirus 2 by RT PCR: NEGATIVE [MT]  2304 IMPRESSION: Bilateral lower lobe consolidation and collapse consistent with extensive bronchopneumonia. Very small amount of layering pleural fluid.   [MT]  2322 Patient is off bipap, now on 10 L , breathing 22 bpm, satting 99%, appears comfortable.  Her sister reports she has had prior compression fx but si unsure how many.  She says they essentially carry the patient  around the house now, as she is not really ambulatory.  She denies any falls or trauma today.  She says the patient never expresses back pain to her, and currently the patient does not appear to have back pain   [MT]    Clinical Course User Index [MT] Arbutus Nelligan, Kermit BaloMatthew J, MD   Final Clinical Impression(s) / ED Diagnoses Final diagnoses:  None    Rx / DC Orders ED Discharge Orders    None       Terald Sleeperrifan, Kinnley Paulson J, MD 09/22/19 [phone]

## 2019-09-21 NOTE — ED Triage Notes (Signed)
Pt comes via [facility] from home for resp distress that started tonight, rales in all lobes, upon EMS arrival pt stats were in the 60's, pt wears bipap at night. PTA received 2gm mag

## 2019-09-21 NOTE — ED Notes (Signed)
Transported to CT 

## 2019-09-21 NOTE — H&P (Addendum)
Date: 09/21/2019               Patient Name:  Kaitlyn Powell MRN: [id number]  DOB: 01-12-1956 Age / Sex: 65 y.o., female   PCP: Tracey Harries, MD         Medical Service: Internal Medicine Teaching Service         Attending Physician: Dr. Renaye Rakers Kermit Balo, MD    First Contact: Ronelle Nigh, MD, Molli Hazard Pager: MM [phone])  Second Contact: Delma Officer, MD, [place] Pager: Namon Cirri [phone])       After Hours (After 5p/  First Contact Pager: [phone]  weekends / holidays): Second Contact Pager: [phone]   Chief Complaint: Respiratory distress  History of Present Illness:  History was obtained via chart review and sister, who is at the bedside due to patient's inability to participate in interview.  Kaitlyn Powell is a 64 y.o with a hx of Down Syndrome, Parkinson's disease, DVT on Xarelto, heart block with a pacemaker in 2017, hypothyroidism, GERD, seizures, osteoporosis, multiple chronic T-spine fractures, and sleep apnea who presented to the ED with hypoxia and respiratory distress.  Patient was in her normal state of health until a couple days ago, the sister noted the patient had been more wheezy than usual.  The patient woke up this morning with increased respiratory effort and had increased shortness of breath which progressed throughout the day.  The patient was feeding on some soup and appeared to have aspirated.  Around 3 PM, the patient became more restless and had increased coughing.  At that time, her sister checked her O2 saturations and saw that it was in the mid 8s.  She gave her a DuoNeb treatment and the patient started coughing more.  Her O2 saturations were not improving and they decided to call 911. When EMS arrived at their home, the patient was initially saturating at 70% on room air.  She was  placed on CPAP initially when she came to the ED, and subsequently was put on BiPAP. She has been weanedto 10L nasal cannula.    Of note, the sister states patient has a history of  aspiration pneumonia, which required a prolonged hospitalization that required ICU care back in 2016. The patient has not been intubated in the past.  The sister notes that she was aware that she has chronic T-spine fractures, but has never complained about pain in the past.  In the ED, CBC, BMP, BNP lactate, and troponin were ordered.  CBC was unremarkable. BMP demonstrated hyponatremia to 129.  BNP slightly elevated to 107.  Lactate within normal limits.  A central line was placed due to difficulty getting IV access.  CT of the chest and CXR demonstrated bilateral lower lobe consolidations, consistent with extensive bronchopneumonia.  Patient was given a dose of vancomycin and Zosyn as well as a liter of normal saline.  Meds:  No outpatient medications have been marked as taking for the 09/21/19 encounter [facility] Encounter).   Allergies: Allergies as of 09/21/2019 - Review Complete 09/21/2019  Allergen Reaction Noted  . Cephalexin Other (See Comments) 06/04/2016  . Namenda [memantine hcl] Other (See Comments) 06/21/2018   Past Medical History:  Diagnosis Date  . Arthritis   . Aspiration pneumonia (HCC)   . Asthma   . Cardiac arrhythmia    have pacemeaker  . Down syndrome   . DVT of popliteal vein (HCC) 09/08/2018   chronic  . GERD (gastroesophageal reflux disease)   . Heart block   .  Hypothyroidism   . Mental retardation   . Pacemaker   . Pulmonary emboli ([place])   . Seizures ([place])   . Sleep apnea with use of continuous positive airway pressure (CPAP)    Past Surgical History:  Procedure Laterality Date  . PACEMAKER INSERTION  12/09/2015   [employer] Accolade MRI EL PPM implanted in [place] for complete heart block and syncope  . PEG TUBE PLACEMENT     removed in 2017  . TIBIA FRACTURE SURGERY Right    Family History:  Family History  Problem Relation Age of Onset  . Diabetes Father   . Heart disease Father   . Breast cancer Sister    Social History:  Social History    Tobacco Use  . Smoking status: Never Smoker  . Smokeless tobacco: Never Used  Substance Use Topics  . Alcohol use: No  . Drug use: No  -Patient has good family support lives with her sister, brother-in-law -Can ambulate short distance, mostly needs transport chair and maximum assistance from family  Review of Systems: A complete ROS was negative except as per HPI.   Imaging: CT Chest: IMPRESSION: Bilateral lower lobe consolidation and collapse consistent with extensive bronchopneumonia. Very small amount of layering pleural fluid.  EKG: personally reviewed my interpretation is sinus bradycardia   CXR:  IMPRESSION: 1. Left IJ central venous line with tip likely in the region of the right atrium via a left-sided SVC variant anatomy. Clinical correlation is recommended. No pneumothorax. 2. Bibasilar atelectasis or infiltrate. 3. Partial compression fx at T1, T3, T5, and T12  Physical Exam: Blood pressure (!) 98/40, pulse 82, temperature (!) 102.6 F (39.2 C), temperature source Rectal, resp. rate (!) 24, SpO2 100 %.  Physical Exam Vitals reviewed.  Constitutional:      General: She is not in acute distress.    Appearance: She is obese. She is ill-appearing. She is not toxic-appearing or diaphoretic.  HENT:     Head: Normocephalic and atraumatic.  Eyes:     General: No scleral icterus.       Right eye: No discharge.        Left eye: No discharge.  Neck:     Comments: Central line in place in right IJ Cardiovascular:     Rate and Rhythm: Normal rate and regular rhythm.     Pulses: Normal pulses.     Heart sounds: Normal heart sounds. No murmur. No friction rub. No gallop.   Pulmonary:     Effort: No respiratory distress.     Breath sounds: No wheezing or rales.     Comments: 10 L [place] in place, normal WOB.  Diminished breath sounds bilaterally due to body habitus Abdominal:     General: Bowel sounds are normal. There is no distension.     Palpations: Abdomen is  soft.     Tenderness: There is no abdominal tenderness. There is no guarding.  Musculoskeletal:     Right lower leg: No edema.     Left lower leg: No edema.  Skin:    General: Skin is warm.  Neurological:     Mental Status: Mental status is at baseline.     Sensory: No sensory deficit.     Comments: Alert but frequently falls asleep during exam.  Squeezes fingers upon asking, but does not follow other commands.  Does not respond to questions  Psychiatric:        Mood and Affect: Mood normal.    Assessment & Plan  by Problem: Active Problems:   Aspiration pneumonia (HCC)  In summary, Kaitlyn Powell is a 64 year old female with a past medical history significant for Down Syndrome, Parkinson's disease, DVT on Xarelto, heart block with a pacemaker in 2017, hypothyroidism, GERD, seizures, osteoporosis, multiple chronic T-spine fractures, and sleep apnea who presented to the ED with increased O2 requirement and respiratory distress in the context of aspirating on soup earlier in the day.  She is being treated for presumed aspiration pneumonia.  Since patient's ability to swallow appears compromised at the moment, will order this many medications through IV.  #Aspiration pneumonia: Patient has a history of aspirating and has required a prolonged hospitalization a few years ago that required ICU care.  At home, patient is on room air during the day and 3 L supplemental O2 at night as needed. Patient initially presented with increased O2 requirement with BiPAP and was weaned down to 10 L O2 nasal cannula. CT chest demonstrated bibasilar stents of bronchopneumonia.  She is now s/p vancomycin and zosyn and 1 L fluids in the ED. -Blood cultures ordered -Continue vancomycin and Zosyn per pharmacy -LR 1.5 L bolus ordered -Tylenol 650 q6hrs PRN -Telemetry -Strict I's and O's -PT/OT/SLP   #Multiple chronic T-spine fx #Hx R Tibia fx #Osteoporosis: Multiple T-spine fractures (T1, T3, T5, T12) seen on CT  chest.  The patient had a bone density scan back in summer of 2020 demonstrated T score -3.3 of forearm, was only partly could scan due to patient intolerance. Unclear if the patient's had vitamin D deficiency work-up in the past.  Does not currently take vitamin D supplements or on bisphosphonate. - Vitamin D level ordered -Patient may benefit from bisphosphonate therapy  #Down syndrome  #Parkinson's Disease -Carbidopa levodopa 25-100 mg 1.5 tablets 3 times daily -Citalopram 20 mg daily -Risperidone 0.5 mg twice daily  #Hx Seizures -IV Keppra 1000 mg, followed 500 mg daily -IV Phenytoin 200 mg twice daily -Cannabidiol 370 mg twice daily  #Hypothyroidism: Patient takes Synthroid 150 mcg daily at home. -IV Synthroid 75 mcg daily  #HLD -Simvastatin 40 mg daily  #Sleep apnea: Patient uses CPAP at home -The setting of aspiration pneumonia, will hold off on CPAP and provide supplemental O2 via [place] -Order CPAP when appropriate  #FEN/GI -Diet: NPO, sips with meds -Fluids: LR 100 cc/hr -Senokot tablet nightly as needed -Vitamin B12 100 mcg daily   #DVT prophylaxis #Hx DVT -Continue home Xarelto 20 mg daily  #CODE STATUS: FULL  #Dispo: Admit patient to Inpatient with expected length of stay greater than 2 midnights.  Signed: Kirt Boys, MD Internal Medicine, PGY1 Pager: [phone]  09/21/2019,1:25 AM

## 2019-09-22 ENCOUNTER — Other Ambulatory Visit: Payer: Self-pay

## 2019-09-22 ENCOUNTER — Encounter (HOSPITAL_COMMUNITY): Payer: Self-pay | Admitting: Internal Medicine

## 2019-09-22 DIAGNOSIS — E440 Moderate protein-calorie malnutrition: Secondary | ICD-10-CM | POA: Diagnosis present

## 2019-09-22 DIAGNOSIS — J9601 Acute respiratory failure with hypoxia: Secondary | ICD-10-CM | POA: Diagnosis present

## 2019-09-22 DIAGNOSIS — Z888 Allergy status to other drugs, medicaments and biological substances status: Secondary | ICD-10-CM | POA: Diagnosis not present

## 2019-09-22 DIAGNOSIS — Z833 Family history of diabetes mellitus: Secondary | ICD-10-CM | POA: Diagnosis not present

## 2019-09-22 DIAGNOSIS — Z515 Encounter for palliative care: Secondary | ICD-10-CM | POA: Diagnosis not present

## 2019-09-22 DIAGNOSIS — Z86711 Personal history of pulmonary embolism: Secondary | ICD-10-CM | POA: Diagnosis not present

## 2019-09-22 DIAGNOSIS — Z7189 Other specified counseling: Secondary | ICD-10-CM | POA: Diagnosis not present

## 2019-09-22 DIAGNOSIS — Z20822 Contact with and (suspected) exposure to covid-19: Secondary | ICD-10-CM | POA: Diagnosis present

## 2019-09-22 DIAGNOSIS — R1310 Dysphagia, unspecified: Secondary | ICD-10-CM | POA: Diagnosis not present

## 2019-09-22 DIAGNOSIS — K219 Gastro-esophageal reflux disease without esophagitis: Secondary | ICD-10-CM

## 2019-09-22 DIAGNOSIS — Z8249 Family history of ischemic heart disease and other diseases of the circulatory system: Secondary | ICD-10-CM | POA: Diagnosis not present

## 2019-09-22 DIAGNOSIS — E871 Hypo-osmolality and hyponatremia: Secondary | ICD-10-CM | POA: Diagnosis present

## 2019-09-22 DIAGNOSIS — Z01811 Encounter for preprocedural respiratory examination: Secondary | ICD-10-CM | POA: Diagnosis not present

## 2019-09-22 DIAGNOSIS — Z9989 Dependence on other enabling machines and devices: Secondary | ICD-10-CM | POA: Diagnosis not present

## 2019-09-22 DIAGNOSIS — N179 Acute kidney failure, unspecified: Secondary | ICD-10-CM | POA: Diagnosis present

## 2019-09-22 DIAGNOSIS — M810 Age-related osteoporosis without current pathological fracture: Secondary | ICD-10-CM

## 2019-09-22 DIAGNOSIS — E039 Hypothyroidism, unspecified: Secondary | ICD-10-CM | POA: Diagnosis present

## 2019-09-22 DIAGNOSIS — J9621 Acute and chronic respiratory failure with hypoxia: Secondary | ICD-10-CM | POA: Diagnosis not present

## 2019-09-22 DIAGNOSIS — Z881 Allergy status to other antibiotic agents status: Secondary | ICD-10-CM | POA: Diagnosis not present

## 2019-09-22 DIAGNOSIS — Q909 Down syndrome, unspecified: Secondary | ICD-10-CM | POA: Diagnosis not present

## 2019-09-22 DIAGNOSIS — Z931 Gastrostomy status: Secondary | ICD-10-CM | POA: Diagnosis not present

## 2019-09-22 DIAGNOSIS — I442 Atrioventricular block, complete: Secondary | ICD-10-CM | POA: Diagnosis present

## 2019-09-22 DIAGNOSIS — J9622 Acute and chronic respiratory failure with hypercapnia: Secondary | ICD-10-CM | POA: Diagnosis not present

## 2019-09-22 DIAGNOSIS — G4733 Obstructive sleep apnea (adult) (pediatric): Secondary | ICD-10-CM | POA: Diagnosis present

## 2019-09-22 DIAGNOSIS — G20 Parkinson's disease: Secondary | ICD-10-CM | POA: Diagnosis present

## 2019-09-22 DIAGNOSIS — J690 Pneumonitis due to inhalation of food and vomit: Secondary | ICD-10-CM | POA: Diagnosis present

## 2019-09-22 DIAGNOSIS — G4730 Sleep apnea, unspecified: Secondary | ICD-10-CM

## 2019-09-22 DIAGNOSIS — Z950 Presence of cardiac pacemaker: Secondary | ICD-10-CM | POA: Diagnosis not present

## 2019-09-22 DIAGNOSIS — J90 Pleural effusion, not elsewhere classified: Secondary | ICD-10-CM | POA: Diagnosis present

## 2019-09-22 DIAGNOSIS — M1990 Unspecified osteoarthritis, unspecified site: Secondary | ICD-10-CM | POA: Diagnosis present

## 2019-09-22 DIAGNOSIS — Z86718 Personal history of other venous thrombosis and embolism: Secondary | ICD-10-CM | POA: Diagnosis not present

## 2019-09-22 DIAGNOSIS — Z6838 Body mass index (BMI) 38.0-38.9, adult: Secondary | ICD-10-CM | POA: Diagnosis not present

## 2019-09-22 DIAGNOSIS — R569 Unspecified convulsions: Secondary | ICD-10-CM

## 2019-09-22 DIAGNOSIS — M8008XA Age-related osteoporosis with current pathological fracture, vertebra(e), initial encounter for fracture: Secondary | ICD-10-CM | POA: Diagnosis present

## 2019-09-22 DIAGNOSIS — G40909 Epilepsy, unspecified, not intractable, without status epilepticus: Secondary | ICD-10-CM | POA: Diagnosis present

## 2019-09-22 DIAGNOSIS — F0280 Dementia in other diseases classified elsewhere without behavioral disturbance: Secondary | ICD-10-CM

## 2019-09-22 LAB — CBC WITH DIFFERENTIAL/PLATELET
Abs Immature Granulocytes: 0.06 10*3/uL (ref 0.00–0.07)
Basophils Absolute: 0 10*3/uL (ref 0.0–0.1)
Basophils Relative: 0 %
Eosinophils Absolute: 0 10*3/uL (ref 0.0–0.5)
Eosinophils Relative: 0 %
HCT: 26.8 % — ABNORMAL LOW (ref 36.0–46.0)
Hemoglobin: 8.9 g/dL — ABNORMAL LOW (ref 12.0–15.0)
Immature Granulocytes: 1 %
Lymphocytes Relative: 2 %
Lymphs Abs: 0.2 10*3/uL — ABNORMAL LOW (ref 0.7–4.0)
MCH: 34.9 pg — ABNORMAL HIGH (ref 26.0–34.0)
MCHC: 33.2 g/dL (ref 30.0–36.0)
MCV: 105.1 fL — ABNORMAL HIGH (ref 80.0–100.0)
Monocytes Absolute: 0.4 10*3/uL (ref 0.1–1.0)
Monocytes Relative: 4 %
Neutro Abs: 10.3 10*3/uL — ABNORMAL HIGH (ref 1.7–7.7)
Neutrophils Relative %: 93 %
Platelets: 77 10*3/uL — ABNORMAL LOW (ref 150–400)
RBC: 2.55 MIL/uL — ABNORMAL LOW (ref 3.87–5.11)
RDW: 12.8 % (ref 11.5–15.5)
WBC: 11 10*3/uL — ABNORMAL HIGH (ref 4.0–10.5)
nRBC: 0 % (ref 0.0–0.2)

## 2019-09-22 LAB — COMPREHENSIVE METABOLIC PANEL
ALT: 6 U/L (ref 0–44)
AST: 12 U/L — ABNORMAL LOW (ref 15–41)
Albumin: 2.1 g/dL — ABNORMAL LOW (ref 3.5–5.0)
Alkaline Phosphatase: 48 U/L (ref 38–126)
Anion gap: 6 (ref 5–15)
BUN: 17 mg/dL (ref 8–23)
CO2: 25 mmol/L (ref 22–32)
Calcium: 7.5 mg/dL — ABNORMAL LOW (ref 8.9–10.3)
Chloride: 97 mmol/L — ABNORMAL LOW (ref 98–111)
Creatinine, Ser: 1.04 mg/dL — ABNORMAL HIGH (ref 0.44–1.00)
GFR calc Af Amer: >60 mL/min (ref >60)
GFR calc non Af Amer: 57 mL/min — ABNORMAL LOW (ref >60)
Glucose, Bld: 102 mg/dL — ABNORMAL HIGH (ref 70–99)
Potassium: 4.3 mmol/L (ref 3.5–5.1)
Sodium: 128 mmol/L — ABNORMAL LOW (ref 135–145)
Total Bilirubin: 0.6 mg/dL (ref 0.3–1.2)
Total Protein: 5.6 g/dL — ABNORMAL LOW (ref 6.5–8.1)

## 2019-09-22 LAB — CBC
HCT: 25.4 % — ABNORMAL LOW (ref 36.0–46.0)
Hemoglobin: 8.8 g/dL — ABNORMAL LOW (ref 12.0–15.0)
MCH: 35.5 pg — ABNORMAL HIGH (ref 26.0–34.0)
MCHC: 34.6 g/dL (ref 30.0–36.0)
MCV: 102.4 fL — ABNORMAL HIGH (ref 80.0–100.0)
Platelets: 57 10*3/uL — ABNORMAL LOW (ref 150–400)
RBC: 2.48 MIL/uL — ABNORMAL LOW (ref 3.87–5.11)
RDW: 12.5 % (ref 11.5–15.5)
WBC: 11.4 10*3/uL — ABNORMAL HIGH (ref 4.0–10.5)
nRBC: 0 % (ref 0.0–0.2)

## 2019-09-22 LAB — VITAMIN D 25 HYDROXY (VIT D DEFICIENCY, FRACTURES): Vit D, 25-Hydroxy: 12.65 ng/mL — ABNORMAL LOW (ref 30–100)

## 2019-09-22 LAB — MRSA PCR SCREENING: MRSA by PCR: NEGATIVE

## 2019-09-22 LAB — MAGNESIUM: Magnesium: 1.7 mg/dL (ref 1.7–2.4)

## 2019-09-22 LAB — GLUCOSE, CAPILLARY: Glucose-Capillary: 125 mg/dL — ABNORMAL HIGH (ref 70–99)

## 2019-09-22 LAB — POC OCCULT BLOOD, ED: Fecal Occult Bld: NEGATIVE

## 2019-09-22 LAB — HIV ANTIBODY (ROUTINE TESTING W REFLEX): HIV Screen 4th Generation wRfx: NONREACTIVE

## 2019-09-22 MED ORDER — CARBIDOPA-LEVODOPA 25-100 MG PO TABS
1.5000 | ORAL_TABLET | Freq: Three times a day (TID) | ORAL | Status: DC
  Administered 2019-09-22 – 2019-10-12 (×45): 1.5 via ORAL
  Filled 2019-09-22 (×14): qty 2
  Filled 2019-09-22: qty 1.5
  Filled 2019-09-22 (×18): qty 2
  Filled 2019-09-22: qty 1.5
  Filled 2019-09-22 (×18): qty 2

## 2019-09-22 MED ORDER — PHENYTOIN SODIUM EXTENDED 100 MG PO CAPS: 200.0000 mg | ORAL_CAPSULE | Freq: Two times a day (BID) | ORAL | Status: DC

## 2019-09-22 MED ORDER — ACETAMINOPHEN 650 MG RE SUPP
650.0000 mg | Freq: Four times a day (QID) | RECTAL | Status: DC | PRN
  Filled 2019-09-22: qty 1

## 2019-09-22 MED ORDER — VITAMIN B-12 100 MCG PO TABS
100.0000 ug | ORAL_TABLET | Freq: Every day | ORAL | Status: DC
  Administered 2019-09-22 – 2019-09-28 (×6): 100 ug via ORAL
  Filled 2019-09-22 (×10): qty 1

## 2019-09-22 MED ORDER — BUDESONIDE 0.25 MG/2ML IN SUSP
0.2500 mg | Freq: Two times a day (BID) | RESPIRATORY_TRACT | Status: DC
  Administered 2019-09-23 – 2019-10-12 (×38): 0.25 mg via RESPIRATORY_TRACT
  Filled 2019-09-22 (×38): qty 2

## 2019-09-22 MED ORDER — ACETAMINOPHEN 325 MG PO TABS
650.0000 mg | ORAL_TABLET | Freq: Four times a day (QID) | ORAL | Status: DC | PRN
  Administered 2019-09-23 – 2019-09-28 (×2): 650 mg via ORAL
  Filled 2019-09-22 (×2): qty 2

## 2019-09-22 MED ORDER — LEVETIRACETAM ER 500 MG PO TB24: 500.0000 mg | ORAL_TABLET | Freq: Every day | ORAL | Status: DC

## 2019-09-22 MED ORDER — CITALOPRAM HYDROBROMIDE 20 MG PO TABS
20.0000 mg | ORAL_TABLET | Freq: Every day | ORAL | Status: DC
  Administered 2019-09-22 – 2019-09-28 (×6): 20 mg via ORAL
  Filled 2019-09-22 (×6): qty 1
  Filled 2019-09-22: qty 2
  Filled 2019-09-22 (×2): qty 1

## 2019-09-22 MED ORDER — LEVOTHYROXINE SODIUM 75 MCG PO TABS: 150.0000 ug | ORAL_TABLET | Freq: Every day | ORAL | Status: DC

## 2019-09-22 MED ORDER — SODIUM CHLORIDE 0.9 % IV SOLN: INTRAVENOUS | Status: AC

## 2019-09-22 MED ORDER — RISPERIDONE 1 MG PO TABS
1.0000 mg | ORAL_TABLET | Freq: Two times a day (BID) | ORAL | Status: DC
  Filled 2019-09-22: qty 1

## 2019-09-22 MED ORDER — CANNABIDIOL 100 MG/ML PO SOLN
3.7000 mL | Freq: Two times a day (BID) | ORAL | Status: DC
  Administered 2019-09-23 – 2019-09-28 (×8): 370 mg via ORAL
  Filled 2019-09-22 (×18): qty 3.7

## 2019-09-22 MED ORDER — CHLORHEXIDINE GLUCONATE CLOTH 2 % EX PADS
6.0000 | MEDICATED_PAD | Freq: Every day | CUTANEOUS | Status: DC
  Administered 2019-09-23 – 2019-10-12 (×20): 6 via TOPICAL

## 2019-09-22 MED ORDER — IPRATROPIUM-ALBUTEROL 0.5-2.5 (3) MG/3ML IN SOLN
3.0000 mL | RESPIRATORY_TRACT | Status: DC | PRN
  Administered 2019-09-23 – 2019-10-08 (×7): 3 mL via RESPIRATORY_TRACT
  Filled 2019-09-22 (×8): qty 3

## 2019-09-22 MED ORDER — RIVAROXABAN 20 MG PO TABS
20.0000 mg | ORAL_TABLET | Freq: Every day | ORAL | Status: DC
  Administered 2019-09-22 – 2019-09-27 (×6): 20 mg via ORAL
  Filled 2019-09-22 (×6): qty 1

## 2019-09-22 MED ORDER — LEVOTHYROXINE SODIUM 100 MCG/5ML IV SOLN
75.0000 ug | Freq: Every day | INTRAVENOUS | Status: DC
  Administered 2019-09-23: 75 ug via INTRAVENOUS
  Filled 2019-09-22: qty 5

## 2019-09-22 MED ORDER — SODIUM CHLORIDE 0.9 % IV SOLN
1.5000 g | Freq: Four times a day (QID) | INTRAVENOUS | Status: DC
  Administered 2019-09-22 – 2019-09-28 (×25): 1.5 g via INTRAVENOUS
  Filled 2019-09-22 (×2): qty 1.5
  Filled 2019-09-22: qty 4
  Filled 2019-09-22 (×4): qty 1.5
  Filled 2019-09-22 (×2): qty 4
  Filled 2019-09-22: qty 1.5
  Filled 2019-09-22: qty 4
  Filled 2019-09-22: qty 1.5
  Filled 2019-09-22: qty 4
  Filled 2019-09-22 (×2): qty 1.5
  Filled 2019-09-22 (×2): qty 4
  Filled 2019-09-22: qty 1.5
  Filled 2019-09-22: qty 4
  Filled 2019-09-22 (×4): qty 1.5
  Filled 2019-09-22: qty 4
  Filled 2019-09-22 (×3): qty 1.5

## 2019-09-22 MED ORDER — RISPERIDONE 0.5 MG PO TABS
0.5000 mg | ORAL_TABLET | Freq: Two times a day (BID) | ORAL | Status: DC
  Administered 2019-09-22 – 2019-09-28 (×10): 0.5 mg via ORAL
  Filled 2019-09-22 (×19): qty 1

## 2019-09-22 MED ORDER — ENOXAPARIN SODIUM 80 MG/0.8ML ~~LOC~~ SOLN
70.0000 mg | Freq: Two times a day (BID) | SUBCUTANEOUS | Status: DC
  Administered 2019-09-22: 70 mg via SUBCUTANEOUS
  Filled 2019-09-22: qty 0.7

## 2019-09-22 MED ORDER — BUDESONIDE 0.25 MG/2ML IN SUSP: 0.2500 mg | Freq: Two times a day (BID) | RESPIRATORY_TRACT | Status: DC

## 2019-09-22 MED ORDER — SODIUM CHLORIDE 0.9 % IV SOLN
200.0000 mg | Freq: Two times a day (BID) | INTRAVENOUS | Status: DC
  Administered 2019-09-22 – 2019-09-23 (×4): 200 mg via INTRAVENOUS
  Filled 2019-09-22 (×6): qty 4

## 2019-09-22 MED ORDER — LEVETIRACETAM ER 500 MG PO TB24
1000.0000 mg | ORAL_TABLET | Freq: Every day | ORAL | Status: DC
  Filled 2019-09-22: qty 2

## 2019-09-22 MED ORDER — SENNOSIDES-DOCUSATE SODIUM 8.6-50 MG PO TABS: 1.0000 | ORAL_TABLET | Freq: Every evening | ORAL | Status: DC | PRN

## 2019-09-22 MED ORDER — LEVETIRACETAM IN NACL 1000 MG/100ML IV SOLN
1000.0000 mg | Freq: Every day | INTRAVENOUS | Status: DC
  Administered 2019-09-22 (×2): 1000 mg via INTRAVENOUS
  Filled 2019-09-22 (×2): qty 100

## 2019-09-22 MED ORDER — LEVETIRACETAM IN NACL 500 MG/100ML IV SOLN
500.0000 mg | Freq: Every morning | INTRAVENOUS | Status: DC
  Administered 2019-09-22 – 2019-09-23 (×2): 500 mg via INTRAVENOUS
  Filled 2019-09-22 (×2): qty 100

## 2019-09-22 MED ORDER — SIMVASTATIN 20 MG PO TABS
40.0000 mg | ORAL_TABLET | Freq: Every day | ORAL | Status: DC
  Administered 2019-09-22 – 2019-09-28 (×7): 40 mg via ORAL
  Filled 2019-09-22 (×7): qty 2

## 2019-09-22 MED ORDER — RIVAROXABAN 20 MG PO TABS: 20.0000 mg | ORAL_TABLET | Freq: Every day | ORAL | Status: DC

## 2019-09-22 MED ORDER — LACTATED RINGERS IV BOLUS (SEPSIS)
1500.0000 mL | Freq: Once | INTRAVENOUS | Status: AC
  Administered 2019-09-22: 1500 mL via INTRAVENOUS

## 2019-09-22 NOTE — ED Provider Notes (Signed)
  Physical Exam  BP (!) 98/40   Pulse 82   Temp (!) 102.6 F (39.2 C) (Rectal)   Resp (!) 24   SpO2 100%   Physical Exam  ED Course/Procedures   Clinical Course as of Sep 22 3  Thu Sep 21, 2019  2131 Central line placed left IJ, awaiting xray, then CT lungs to evaluate for possible aspiration   [MT]  2224 SARS Coronavirus 2 by RT PCR: NEGATIVE [MT]  2304 IMPRESSION: Bilateral lower lobe consolidation and collapse consistent with extensive bronchopneumonia. Very small amount of layering pleural fluid.   [MT]  2322 Patient is off bipap, now on 10 L [place], breathing 22 bpm, satting 99%, appears comfortable.  Her sister reports she has had prior compression fx but si unsure how many.  She says they essentially carry the patient around the house now, as she is not really ambulatory.  She denies any falls or trauma today.  She says the patient never expresses back pain to her, and currently the patient does not appear to have back pain   [MT]    Clinical Course User Index [MT] Trifan, Kermit Balo, MD    Procedures  MDM    64 year old with history of Down syndrome comes in a chief complaint of respiratory distress.  Patient is on BiPAP.  She is noted to have bronchopneumonia.  Appropriate antibiotics initiated. 1 L of fluid ordered.  She has one MAP less than 65.  SBP remains over 90 mmhg. Additional 1.5 L of LR ordered to achieve the 30 cc/kg. Code sepsis activated.  Patient has received his first liter. Sepsis hemodynamic reassessment completed at 12:05 AM.  Vitals:   09/21/19 2303 09/21/19 2315  BP: (!) 98/40   Pulse: 82 82  Resp: 14 (!) 24  Temp:    SpO2: 100% 100%         Derwood Kaplan, MD 09/22/19 0009

## 2019-09-22 NOTE — Progress Notes (Signed)
Spoke with RN Chrissie Noa and express concerns from patient's family member as to why the PIV needed to be placed. The patient has a working central line that was placed last night and the patient is a difficult stick. The family member preferred the central line over the PIV placement. The family member stated that the patient had pneumonia prior to admission to the ER and knows the patient has an infection and does not believe the central line is the cause of her infection or pneumonia. RN Chrissie Noa is planning to contact the DR for further clarification. If the Dr continues to want the central line removed then I asked Chrissie Noa to replace the IV team consult and we will return.

## 2019-09-22 NOTE — Evaluation (Signed)
Clinical/Bedside Swallow Evaluation Patient Details  Name: Kaitlyn Powell MRN: [id number] Date of Birth: Sep 23, 1955  Today's Date: 09/22/2019 Time: SLP Start Time (ACUTE ONLY): 2751 SLP Stop Time (ACUTE ONLY): 1308 SLP Time Calculation (min) (ACUTE ONLY): 22 min  Past Medical History:  Past Medical History:  Diagnosis Date  . Arthritis   . Aspiration pneumonia ([place])   . Asthma   . Cardiac arrhythmia    have pacemeaker  . Down syndrome   . DVT of popliteal vein ([place]) 09/08/2018   chronic  . GERD (gastroesophageal reflux disease)   . Heart block   . Hypothyroidism   . Mental retardation   . Pacemaker   . Pulmonary emboli ([place])   . Seizures ([place])   . Sleep apnea with use of continuous positive airway pressure (CPAP)    Past Surgical History:  Past Surgical History:  Procedure Laterality Date  . PACEMAKER INSERTION  12/09/2015   [employer] Accolade MRI EL PPM implanted in [place] for complete heart block and syncope  . PEG TUBE PLACEMENT     removed in 2017  . TIBIA FRACTURE SURGERY Right    HPI:  64 y.o with a hx of Down Syndrome, recent dx Parkinson's disease, DVT, heart block with a pacemaker in 2017, hypothyroidism, GERD, seizures, osteoporosis, multiple chronic T-spine fractures, and sleep apnea who presented to the ED with hypoxia and respiratory distress. Pt with increased respiratory effort and SOB, appeared to aspirate when eating soup.  Arrived to ED requiring CPAP and weaned to 10L nasal cannula.  Per notes, pt has hx of aspiration pna, remote hx PEG (removed 2017 after swallowing issues were resolved).  Evaluated by this SLP on 08/02/18, at which time she presented with quite functional swallow with no overt concerns for aspiration - soft diet and thin liquids were recommended.  Her sister reports slight worsening in swallow function the last few months.    Assessment / Plan / Recommendation Clinical Impression  Pt presents with functional swallow with adequate oral  attention and manipulation of purees, palpable swallow, and no s/s of aspiration, even when taxed with successive sips of water from a straw. She verbalized that she was hungry and eagerly consumed 4 oz of applesauce.  There appeared to be adequate coordination of swallow/respiratory cycles.  Discussed with her sister the recent changes in Kaitlyn Powell's swallowing, the challenges of certain food types (mixed solid/liquid consistencies like soup, cereal with milk). We agreed to begin a pureed/dysphagia 1 diet for now; thin liquids; give meds crushed in puree.  SLP will follow briefly for toleration/safety/education.  Pt's sister agrees with plan.  SLP Visit Diagnosis: Dysphagia, oropharyngeal phase (R13.12)    Aspiration Risk       Diet Recommendation     Medication Administration: Crushed with puree    Other  Recommendations Oral Care Recommendations: Oral care BID   Follow up Recommendations Other (comment)(tba)      Frequency and Duration min 2x/week  1 week       Prognosis Prognosis for Safe Diet Advancement: Good      Swallow Study   General HPI: 64 y.o with a hx of Down Syndrome, recent dx Parkinson's disease, DVT, heart block with a pacemaker in 2017, hypothyroidism, GERD, seizures, osteoporosis, multiple chronic T-spine fractures, and sleep apnea who presented to the ED with hypoxia and respiratory distress. Pt with increased respiratory effort and SOB, appeared to aspirate when eating soup.  Arrived to ED requiring CPAP and weaned to 10L nasal cannula.  Per notes, pt has hx of aspiration pna, remote hx PEG (removed 2017 after swallowing issues were resolved).  Evaluated by this SLP on 08/02/18, at which time she presented with quite functional swallow with no overt concerns for aspiration - soft diet and thin liquids were recommended.  Her sister reports slight worsening in swallow function the last few months.  Type of Study: Bedside Swallow Evaluation Previous Swallow Assessment: see  HPI Diet Prior to this Study: Dysphagia 1 (puree);Thin liquids Temperature Spikes Noted: No Respiratory Status: Nasal cannula History of Recent Intubation: No Behavior/Cognition: Alert;Cooperative Oral Cavity Assessment: Dry Oral Care Completed by SLP: No Oral Cavity - Dentition: Missing dentition Patient Positioning: Upright in bed Baseline Vocal Quality: Normal Volitional Cough: Cognitively unable to elicit Volitional Swallow: Able to elicit    Oral/Motor/Sensory Function Overall Oral Motor/Sensory Function: Within functional limits   Ice Chips Ice chips: Within functional limits   Thin Liquid Thin Liquid: Within functional limits    Nectar Thick Nectar Thick Liquid: Not tested   Honey Thick Honey Thick Liquid: Not tested   Puree Puree: Within functional limits   Solid     Solid: Not tested      Blenda Mounts Laurice 09/22/2019,1:14 PM  [name] L. Samson Frederic, MA CCC/SLP Acute Rehabilitation Services Office number [phone] Pager [phone]

## 2019-09-22 NOTE — Progress Notes (Signed)
Notified bedside nurse of need to draw blood cultures.  

## 2019-09-22 NOTE — Care Management (Signed)
CM informed by [facility] that pt is active for PT and ST

## 2019-09-22 NOTE — Evaluation (Signed)
Physical Therapy Evaluation Patient Details Name: Kaitlyn Powell MRN: [id number] DOB: September 21, 1955 Today's Date: 09/22/2019   History of Present Illness  64 y.o with a hx of Down Syndrome, Parkinson's disease, DVT on Xarelto, heart block with a pacemaker in 2017, hypothyroidism, GERD, seizures, osteoporosis, multiple chronic T-spine fractures, and sleep apnea who presented to the ED with hypoxia and respiratory distress.  Clinical Impression  Pt presents to PT with deficits in functional mobility, balance, strength, endurance, power, cognition. Pt is lethargic, minimally conversant this session. Pt requires increased time and is only able to follow very simple single step commands roughly 50% of the time. Pt demonstrates no AROM of BLE, is able to mobilize UE after PT initiation. Pt currently requires significant assistance for bed mobility. Pt's sister (primary caregiver) reports the patient has required +2 assistance for the past few months. Pt's sister would like to return home with continued HHPT. PT recommending a 3 in 1 commode and mechanical lift to reduce risk of falls and injury.    Follow Up Recommendations Home health PT;Supervision/Assistance - 24 hour    Equipment Recommendations  3in1 (PT);Other (comment)(mechanical lift)    Recommendations for Other Services       Precautions / Restrictions Precautions Precautions: Fall Restrictions Weight Bearing Restrictions: No      Mobility  Bed Mobility Overal bed mobility: Needs Assistance Bed Mobility: Supine to Sit;Sit to Supine     Supine to sit: Total assist;+2 for physical assistance;HOB elevated Sit to supine: Total assist;+2 for physical assistance;HOB elevated      Transfers                    Ambulation/Gait                Stairs            Wheelchair Mobility    Modified Rankin (Stroke Patients Only)       Balance Overall balance assessment: Needs assistance Sitting-balance support:  Bilateral upper extremity supported;Feet supported Sitting balance-Leahy Scale: Zero Sitting balance - Comments: maxA at edge of bed                                     Pertinent Vitals/Pain Pain Assessment: Faces Pain Score: 0-No pain    Home Living Family/patient expects to be discharged to:: Private residence Living Arrangements: Other relatives(sister, brother-in-law) Available Help at Discharge: Family;Available 24 hours/day Type of Home: House Home Access: Stairs to enter Entrance Stairs-Rails: Left Entrance Stairs-Number of Steps: 5 Home Layout: Multi-level;Able to live on main level with bedroom/bathroom Home Equipment: Wheelchair - manual;Walker - 2 wheels;Grab bars - tub/shower Additional Comments: adjustable bed    Prior Function Level of Independence: Needs assistance   Gait / Transfers Assistance Needed: pt requires maxA +2 to ambulate very short household distances  ADL's / Homemaking Assistance Needed: requires assist with all ADLS        Hand Dominance        Extremity/Trunk Assessment   Upper Extremity Assessment Upper Extremity Assessment: Generalized weakness    Lower Extremity Assessment Lower Extremity Assessment: RLE deficits/detail;LLE deficits/detail RLE Deficits / Details: no AROM noted, PROM WFL, increased extensor tone in BLE LLE Deficits / Details: no AROM noted, PROM WFL, increased extensor tone in BLE    Cervical / Trunk Assessment Cervical / Trunk Assessment: Kyphotic  Communication   Communication: Expressive difficulties  Cognition Arousal/Alertness: Lethargic Behavior  During Therapy: WFL for tasks assessed/performed Overall Cognitive Status: History of cognitive impairments - at baseline                                 General Comments: pt with down syndrome, is more lethargic than normal currently      General Comments General comments (skin integrity, edema, etc.): VSS on 5L/min, audible  wheezing noted    Exercises     Assessment/Plan    PT Assessment Patient needs continued PT services  PT Problem List Decreased strength;Decreased activity tolerance;Decreased balance;Decreased mobility;Decreased cognition;Decreased knowledge of use of DME;Decreased safety awareness;Decreased coordination;Cardiopulmonary status limiting activity       PT Treatment Interventions DME instruction;Gait training;Functional mobility training;Therapeutic activities;Stair training;Therapeutic exercise;Balance training;Neuromuscular re-education;Patient/family education;Wheelchair mobility training    PT Goals (Current goals can be found in the Care Plan section)  Acute Rehab PT Goals Patient Stated Goal: To improve mobility and become more alert PT Goal Formulation: With patient/family Time For Goal Achievement: 10/06/19 Potential to Achieve Goals: Fair    Frequency Min 2X/week   Barriers to discharge        Co-evaluation               AM-PAC PT "6 Clicks" Mobility  Outcome Measure Help needed turning from your back to your side while in a flat bed without using bedrails?: Total Help needed moving from lying on your back to sitting on the side of a flat bed without using bedrails?: Total Help needed moving to and from a bed to a chair (including a wheelchair)?: Total Help needed standing up from a chair using your arms (e.g., wheelchair or bedside chair)?: Total Help needed to walk in hospital room?: Total Help needed climbing 3-5 steps with a railing? : Total 6 Click Score: 6    End of Session Equipment Utilized During Treatment: Oxygen Activity Tolerance: Patient limited by lethargy Patient left: in bed;with call bell/phone within reach;with family/visitor present;with bed alarm set Nurse Communication: Mobility status PT Visit Diagnosis: Muscle weakness (generalized) (M62.81);Other symptoms and signs involving the nervous system (R29.898)    Time: 3329-5188 PT Time  Calculation (min) (ACUTE ONLY): 47 min   Charges:   PT Evaluation $PT Eval High Complexity: 1 High          Zenaida Niece, PT, DPT Acute Rehabilitation Pager: [phone]   Zenaida Niece 09/22/2019, 1:08 PM

## 2019-09-22 NOTE — Progress Notes (Signed)
Subjective:  Patient seen this AM, daughter at bedside. Daughter states that patient had past hospitalization for aspiration pneumonia which required 6 weeks in the ICU. She reports that patient has had progressive decline and at home requires 2 person assistance. She reports that patient sleeps with CPAP at night. States patient is difficult stick and does not want central line removed.  Objective:    Vital Signs (last 24 hours): Vitals:   09/22/19 0600 09/22/19 0615 09/22/19 0630 09/22/19 0741  BP: (!) 101/53 (!) 101/54 (!) 102/52 94/61  Pulse: (!) 57 (!) 56 (!) 58 64  Resp: (!) 25 (!) 21 17 16   Temp:      TempSrc:      SpO2: 98% 99% 99% 96%  Weight:      Height:       Physical Exam: General Resting comfortably in bed, no acute distress  Cardiac Regular rate and rhythm, no murmurs, rubs, or gallops  Pulmonary Breathing comfortably, saturating well on BiPap. No respiratory distress. Respiratory exam limited by patient positioning.   Abdominal Soft, non-tender, without distention  Extremities No peripheral edema   CBC Latest Ref Rng & Units 09/22/2019 09/21/2019 09/21/2019  WBC 4.0 - 10.5 K/uL 11.0(H) - 7.9  Hemoglobin 12.0 - 15.0 g/dL 09/23/2019) 11.9(L) 11.7(L)  Hematocrit 36.0 - 46.0 % 26.8(L) 35.0(L) 34.4(L)  Platelets 150 - 400 K/uL 77(L) - PLATELET CLUMPS NOTED ON SMEAR, UNABLE TO ESTIMATE   BMP Latest Ref Rng & Units 09/22/2019 09/21/2019 09/21/2019  Glucose 70 - 99 mg/dL 09/23/2019) - 323(F)  BUN 8 - 23 mg/dL 17 - 16  Creatinine 573(U - 1.00 mg/dL 2.02) - 5.42(H  BUN/Creat Ratio 12 - 28 - - -  Sodium 135 - 145 mmol/L 128(L) 127(L) 129(L)  Potassium 3.5 - 5.1 mmol/L 4.3 4.2 4.3  Chloride 98 - 111 mmol/L 97(L) - 94(L)  CO2 22 - 32 mmol/L 25 - 24  Calcium 8.9 - 10.3 mg/dL 7.5(L) - 8.7(L)    Assessment/Plan:   Active Problems:   Aspiration pneumonia [facility])  Patient is a 64 year old female with past medical history significant for Down syndrome, Parkinson's disease, DVT on Xarelto,  heart block with a pacemaker in 2017, hypothyroidism, GERD, seizures, osteoporosis, multiple chronic T-spine fractures, and sleep apnea who presented to the ER on 09/21/2019 with increased O2 requirement and respiratory distress in the context of aspirating on soup earlier in the day.  # Aspiration pneumonia: Patient has a history of aspiration and has required a prolonged hospitalization a few years ago that required ICU care. Patient on BiPap this morning and 5 L [place] currently. CT chest demonstrated bilateral lower lobe consolidations. * Initially on vancomycin+zosyn. MRSA PCR negative, will narrow to unasyn per pharmacy consult * Followup blood cultures * Cardiac telemetry, continuous pulse oximetry * NPO pending SLP evaluation * Strict I/Os * On mIVF NS 100 cc/hr  # Down Syndrome # Parkinson's Disease *Continue carbidopa levodopa 37.5-150 mg 3 times daily *Citalopram 20 mg daily *Risperidone 0.5 mg twice daily  #Seizure history: Continue IV Keppra 1000 mg (QHS) + 500 mg (QAM) + phenytoin 200 mg twice daily + cannabidiol 370 mg twice daily  # Hypothyroidism: Patient takes levothyroxine 150 mcg daily. Will continue IV levothyroxine 75 mcg daily  # HLD: Continue simvastatin 40 mg daily  # Sleep apnea: CPAP at night  # History of PE/DVT: Patient on xarelto 20 mg daily at home. Will continue when patient cleared by SLP.  * Lovenox 70 mg Q12HR  PT/OT:  Consulted Diet: Dysphagia 1 diet, medications crushed with puree, per SLP DVT Ppx: Will transition from therapeutic lovenox to Xarelto 20 mg daily Admit Status: Inpatient Dispo: Anticipated discharge pending further medical workup  Jeanmarie Hubert, MD 09/22/2019, 7:54 AM

## 2019-09-22 NOTE — Progress Notes (Signed)
Pharmacy Antibiotic Note  Kaitlyn Powell is a 64 y.o. female admitted on 09/21/2019 with pneumonia.  Pharmacy has been consulted for Vancomycin and zosyn dosing - now changed to unasyn.   WBC 7.9>11. Temp 102>afebrile. MRSA PCR neg.   Plan: Discontinue zosyn and vancomycin Unasyn 1.5 g IV every 6 hours F/u renal function,cultures and clinical course  Height: 4\' 10"  (147.3 cm) Weight: 155 lb (70.3 kg) IBW/kg (Calculated) : 40.9  Temp (24hrs), Avg:99.7 F (37.6 C), Min:98.5 F (36.9 C), Max:102.6 F (39.2 C)  Recent Labs  Lab 09/21/19 2011 09/21/19 2012 09/21/19 2212 09/22/19 0406  WBC 7.9  --   --  11.0*  CREATININE 0.90  --   --  1.04*  LATICACIDVEN  --  1.7 1.0  --     Estimated Creatinine Clearance: 46.1 mL/min (A) (by C-G formula based on SCr of 1.04 mg/dL (H)).    Allergies  Allergen Reactions  . Cephalexin Other (See Comments)    Seizures   . Namenda [Memantine Hcl] Other (See Comments)    Made memory issues worse, sleepiness, increased confusion     Thank you for allowing pharmacy to be a part of this patient's care.  09/24/19, PharmD, BCCCP Clinical Pharmacist  Phone: [phone]  Please check AMION for all [facility] phone numbers After 10:00 PM, call Main Pharmacy [phone] 09/22/2019 11:25 AM

## 2019-09-23 LAB — BASIC METABOLIC PANEL
Anion gap: 8 (ref 5–15)
BUN: 16 mg/dL (ref 8–23)
CO2: 25 mmol/L (ref 22–32)
Calcium: 7.8 mg/dL — ABNORMAL LOW (ref 8.9–10.3)
Chloride: 100 mmol/L (ref 98–111)
Creatinine, Ser: 1.09 mg/dL — ABNORMAL HIGH (ref 0.44–1.00)
GFR calc Af Amer: >60 mL/min (ref >60)
GFR calc non Af Amer: 54 mL/min — ABNORMAL LOW (ref >60)
Glucose, Bld: 95 mg/dL (ref 70–99)
Potassium: 3.8 mmol/L (ref 3.5–5.1)
Sodium: 133 mmol/L — ABNORMAL LOW (ref 135–145)

## 2019-09-23 LAB — GLUCOSE, CAPILLARY
Glucose-Capillary: 72 mg/dL (ref 70–99)
Glucose-Capillary: 98 mg/dL (ref 70–99)

## 2019-09-23 MED ORDER — SODIUM CHLORIDE 0.9% FLUSH: 10.0000 mL | INTRAVENOUS | Status: DC | PRN

## 2019-09-23 MED ORDER — PHENYTOIN SODIUM EXTENDED 100 MG PO CAPS: 200.0000 mg | ORAL_CAPSULE | Freq: Two times a day (BID) | ORAL | Status: DC

## 2019-09-23 MED ORDER — LACTATED RINGERS IV SOLN: INTRAVENOUS | Status: AC

## 2019-09-23 MED ORDER — PHENYTOIN 50 MG PO CHEW
150.0000 mg | CHEWABLE_TABLET | Freq: Two times a day (BID) | ORAL | Status: DC
  Administered 2019-09-23 – 2019-09-24 (×3): 150 mg via ORAL
  Filled 2019-09-23 (×5): qty 3

## 2019-09-23 MED ORDER — SODIUM CHLORIDE 0.9% FLUSH
10.0000 mL | Freq: Two times a day (BID) | INTRAVENOUS | Status: DC
  Administered 2019-09-23 – 2019-10-07 (×22): 10 mL
  Administered 2019-10-07: 20 mL
  Administered 2019-10-08: 10 mL
  Administered 2019-10-09: 20 mL
  Administered 2019-10-10 – 2019-10-12 (×5): 10 mL

## 2019-09-23 MED ORDER — LEVOTHYROXINE SODIUM 75 MCG PO TABS
150.0000 ug | ORAL_TABLET | Freq: Every day | ORAL | Status: DC
  Administered 2019-09-24 – 2019-09-25 (×2): 150 ug via ORAL
  Filled 2019-09-23 (×2): qty 2

## 2019-09-23 MED ORDER — PHENYTOIN 50 MG PO CHEW
50.0000 mg | CHEWABLE_TABLET | ORAL | Status: DC
  Administered 2019-09-23 – 2019-09-24 (×2): 50 mg via ORAL
  Filled 2019-09-23 (×2): qty 1

## 2019-09-23 MED ORDER — LEVETIRACETAM 500 MG PO TABS
500.0000 mg | ORAL_TABLET | Freq: Every day | ORAL | Status: DC
  Administered 2019-09-24: 500 mg via ORAL
  Filled 2019-09-23 (×2): qty 1

## 2019-09-23 MED ORDER — LEVETIRACETAM 500 MG PO TABS
1000.0000 mg | ORAL_TABLET | Freq: Every day | ORAL | Status: DC
  Administered 2019-09-23 – 2019-09-24 (×2): 1000 mg via ORAL
  Filled 2019-09-23 (×2): qty 2

## 2019-09-23 NOTE — Progress Notes (Addendum)
   Subjective:  Patient evaluated at bedside with family this morning. Sister explained her baseline, which is that the sister and her husband pick her up to walk her. She has difficulty with swallowing, due to her Parkinson's. She does have difficulty with moving her bowels, and uses enemas at home. As far as social interactions, it is based on a day by day bases. Some days she is commu, other days she won't speak, and some days she is sleepy. Patient was asleep throughout the interview, but per the family she was awake this morning when respiratory came in to remove the BIPAP machine, and was giggling when she had the vest on.   Objective:  Vital signs in last 24 hours: Vitals:   09/23/19 0010 09/23/19 0400 09/23/19 0413 09/23/19 0427  BP:    (!) 97/45  Pulse:   83 82  Resp: 17  (!) 22 18  Temp:  98.9 F (37.2 C)  98.8 F (37.1 C)  TempSrc:    Axillary  SpO2:   99% 99%  Weight:    82.5 kg  Height:       Physical Exam  HENT:  Head: Normocephalic and atraumatic.  Eyes: Conjunctivae are normal.  Cardiovascular: Normal rate and regular rhythm.  Respiratory: Effort normal and breath sounds normal. No respiratory distress. She has no wheezes.  GI: Soft. Bowel sounds are normal. She exhibits no distension. There is no abdominal tenderness.  Musculoskeletal:        General: No edema.   Assessment/Plan:  Principal Problem:   Aspiration pneumonia (HCC) Active Problems:   Down syndrome   OSA treated with BiPAP   Acquired hypothyroidism   History of pulmonary embolism  Kaitlyn Powell is a 64 y.o female with down syndrome, parkinson's disease, hx of dvt/pe. heartblock with pacemaker who presented with respiratory distress, fever, hypoxia and required bipap on arrival subsequent to aspirating on soup. Found to have bibasilar pneumonitis on Ct scan. Treating for aspiration pneumonia.   Aspiration pneumonia  COVID pcr and flu negative. Central line placed in Left IJ for access on admission  2/18 as it was difficult to get peripheral access on the patient.  Patient has been afebrile over the past 24 hrs.    -Per SLP patient is to have dys 1 diet consistented fo pureed foods and thin liquids -continue unasyn day 2 -monitor fever curve  -blood cultures 2/19 no growth <24hs in 2 bottles - home health pt  -iv team consult to put midline -remove central subsequently  Down Syndrome  Parkinson's Disease: Continue carbidopa levodopa 37.5-150mg  tid, citalopram 20mg  qd, risperidone 0.5mg  bid Seizure disorder: continue IV keppra 1000mg  qhs, 500mg  qam, phenytoin 200mg  bid, cannabidiol 370mg  bid Hypothyroidism: continue Iv levothyroxine qd  Hyperlipidemia: continue simvastatin 40mg  qd   Obstructive Sleep apnea: continue cpap  Hx of PE/DVT: continue xarelto 20mg  qd   Dispo: Anticipated discharge in approximately 1-2 day(s).   , MD 09/23/2019, 6:53 AM

## 2019-09-23 NOTE — Progress Notes (Signed)
Pharmacy Antibiotic Note  Kaitlyn Powell is a 64 y.o. female admitted on 09/21/2019 with aspiration PNA.  Pharmacy has been consulted for Unasyn dosing.  ID: Aspiration PNA - WBC 11.4 up, temp 100.5. Scr 1.09  Zosyn/vanc  2/18 x1 Unasyn 2/19>>  Bcx x2 2/19: sent MRSA PCR 2/19: neg   Plan: Unasyn 1.5g IV q6hr.  Pharmacy will sign off. Please reconsult for further dosing assitance. Change IV>po meds    Height: 4\' 10"  (147.3 cm) Weight: 181 lb 14.1 oz (82.5 kg) IBW/kg (Calculated) : 40.9  Temp (24hrs), Avg:99 F (37.2 C), Min:98 F (36.7 C), Max:100.5 F (38.1 C)  Recent Labs  Lab 09/21/19 2011 09/21/19 2012 09/21/19 2212 09/22/19 0406 09/22/19 1638 09/23/19 0240  WBC 7.9  --   --  11.0* 11.4*  --   CREATININE 0.90  --   --  1.04*  --  1.09*  LATICACIDVEN  --  1.7 1.0  --   --   --     Estimated Creatinine Clearance: 48 mL/min (A) (by C-G formula based on SCr of 1.09 mg/dL (H)).    Allergies  Allergen Reactions  . Cephalexin Other (See Comments)    Seizures   . Namenda [Memantine Hcl] Other (See Comments)    Made memory issues worse, sleepiness, increased confusion    Sheza Strickland S. 09/25/19, PharmD, BCPS Clinical Staff Pharmacist [email or web address]  Merilynn Finland 09/23/2019 10:53 AM

## 2019-09-23 NOTE — Progress Notes (Signed)
SLP Cancellation Note  Patient Details Name: Kaitlyn Powell MRN: [id number] DOB: 1956/01/24   Cancelled treatment:    Pt difficult to arouse today. Spoke with sister/caregiver, who would like to pursue MBS on Monday given changes in swallow function subsequent to Parkinson's dx.  We will plan for study as soon as scheduling permits.  Continue PO diet as ordered.  Caly Pellum L. Samson Frederic, MA CCC/SLP Acute Rehabilitation Services Office number [phone] Pager [phone]        Blenda Mounts Laurice 09/23/2019, 11:43 AM

## 2019-09-23 NOTE — Evaluation (Signed)
Occupational Therapy Evaluation Patient Details Name: Kaitlyn Powell MRN: [id number] DOB: 04-Apr-1956 Today's Date: 09/23/2019    History of Present Illness 64 y.o with a hx of Down Syndrome, Parkinson's disease, DVT on Xarelto, heart block with a pacemaker in 2017, hypothyroidism, GERD, seizures, osteoporosis, multiple chronic T-spine fractures, and sleep apnea who presented to the ED with hypoxia and respiratory distress. Dx: aspiration PNA,    Clinical Impression   Pt admitted with above. She demonstrates the below listed deficits and will benefit from continued OT to maximize safety and independence with BADLs.  Pt lethargic during eval and unable to arouse despite repositioning in bed and PROM of UEs.  Spoke extensively with sister re: their daily routine and problem solved options for DME that may reduce burden of care on sister and brother in law who provide 24 hour assist.  Will follow acutely, and recommend [facility] for home assessment and recommendations for adaptations and equipment in the home.  Pt may require ambulance transport home.       Follow Up Recommendations  Home health OT;Supervision/Assistance - 24 hour    Equipment Recommendations  Wheelchair (measurements OT)(hoyer lift )    Recommendations for Other Services       Precautions / Restrictions Precautions Precautions: Fall      Mobility Bed Mobility               General bed mobility comments: pt too fatigued to attempt   Transfers                 General transfer comment: unable to attempt     Balance Overall balance assessment: Needs assistance Sitting-balance support: Bilateral upper extremity supported Sitting balance-Leahy Scale: Zero Sitting balance - Comments: moved pt into long sitting to place vest for chest PT, and pt required total A due to level of arousal                                    ADL either performed or assessed with clinical judgement   ADL Overall  ADL's : Needs assistance/impaired                           Toilet Transfer Details (indicate cue type and reason): discussed use of drop arm BSC with sister        Tub/Shower Transfer Details (indicate cue type and reason): discussed option of tub transfer bench for walk in shower, however, pt's transfer w/c does not have a removeable armrest.  Attempted to problem solve options for safer transfer with sister    General ADL Comments: discussed options for lift equipment, recommendation for light weight w/c (due to removeable armrest), and possibility of standing assist lift for home      Vision         Perception     Praxis      Pertinent Vitals/Pain Pain Assessment: Faces Pain Score: 0-No pain     Hand Dominance Right   Extremity/Trunk Assessment Upper Extremity Assessment Upper Extremity Assessment: Generalized weakness   Lower Extremity Assessment Lower Extremity Assessment: Defer to PT evaluation       Communication Communication Communication: Expressive difficulties   Cognition Arousal/Alertness: Lethargic  General Comments: Pt sleeping during session, unable to awaken despite ROM and attempting to move pt into unsupported sitting to assist with donning vest for chest PT    General Comments       Exercises     Shoulder Instructions      Home Living Family/patient expects to be discharged to:: Private residence Living Arrangements: Other relatives Available Help at Discharge: Family;Available 24 hours/day Type of Home: House Home Access: Stairs to enter [employer] of Steps: 5 Entrance Stairs-Rails: Left Home Layout: Multi-level;Able to live on main level with bedroom/bathroom     Bathroom Shower/Tub: Producer, television/film/video: Standard     Home Equipment: Transport chair;Shower seat(adjustable bed )   Additional Comments: Pt lives with her sister and brother in  law       Prior Functioning/Environment Level of Independence: Needs assistance  Gait / Transfers Assistance Needed: pt requires maxA +2 to ambulate very short household distances ADL's / Engineer, water Assistance Needed: requires assist with all ADLS.  She is able to assist minimally (status variable) with toilet and shower transfers  Communication / Swallowing Assistance Needed: sister reports pt has been difficult to understand and verbalizing less frequently over the past couple months Comments: sister reports she and spouse try to allow her to do as much as she is able and encourage her to assist with transfers and ambulation         OT Problem List: Decreased strength;Decreased range of motion;Decreased activity tolerance;Impaired balance (sitting and/or standing);Decreased cognition;Decreased safety awareness;Decreased knowledge of use of DME or AE;Cardiopulmonary status limiting activity;Obesity;Impaired UE functional use      OT Treatment/Interventions: Self-care/ADL training;DME and/or AE instruction;Therapeutic activities;Cognitive remediation/compensation;Patient/family education;Balance training    OT Goals(Current goals can be found in the care plan section) Acute Rehab OT Goals Patient Stated Goal: per sister, to keep her as mobile as possible  OT Goal Formulation: With family Time For Goal Achievement: 10/07/19 Potential to Achieve Goals: Good ADL Goals Pt Will Transfer to Toilet: with max assist;stand pivot transfer;bedside commode  OT Frequency: Min 2X/week   Barriers to D/C:            Co-evaluation              AM-PAC OT "6 Clicks" Daily Activity     Outcome Measure Help from another person eating meals?: Total Help from another person taking care of personal grooming?: Total Help from another person toileting, which includes using toliet, bedpan, or urinal?: Total Help from another person bathing (including washing, rinsing, drying)?: Total Help from  another person to put on and taking off regular upper body clothing?: Total Help from another person to put on and taking off regular lower body clothing?: Total 6 Click Score: 6   End of Session Equipment Utilized During Treatment: Oxygen Nurse Communication: Mobility status  Activity Tolerance: Patient limited by lethargy Patient left: in bed;with call bell/phone within reach;with family/visitor present  OT Visit Diagnosis: Unsteadiness on feet (R26.81);Cognitive communication deficit (R41.841)                Time: 7829-5621 OT Time Calculation (min): 38 min Charges:  OT General Charges $OT Visit: 1 Visit OT Evaluation $OT Eval Moderate Complexity: 1 Mod OT Treatments $Self Care/Home Management : 23-37 mins  Eber Jones., OTR/L Acute Rehabilitation Services Pager [phone] Office [phone]   Jeani Hawking M 09/23/2019, 6:06 PM

## 2019-09-24 LAB — BASIC METABOLIC PANEL
Anion gap: 7 (ref 5–15)
BUN: 13 mg/dL (ref 8–23)
CO2: 24 mmol/L (ref 22–32)
Calcium: 7.6 mg/dL — ABNORMAL LOW (ref 8.9–10.3)
Chloride: 105 mmol/L (ref 98–111)
Creatinine, Ser: 0.68 mg/dL (ref 0.44–1.00)
GFR calc Af Amer: >60 mL/min (ref >60)
GFR calc non Af Amer: >60 mL/min (ref >60)
Glucose, Bld: 81 mg/dL (ref 70–99)
Potassium: 3.7 mmol/L (ref 3.5–5.1)
Sodium: 136 mmol/L (ref 135–145)

## 2019-09-24 LAB — CBC
HCT: 27.6 % — ABNORMAL LOW (ref 36.0–46.0)
Hemoglobin: 9.3 g/dL — ABNORMAL LOW (ref 12.0–15.0)
MCH: 34.6 pg — ABNORMAL HIGH (ref 26.0–34.0)
MCHC: 33.7 g/dL (ref 30.0–36.0)
MCV: 102.6 fL — ABNORMAL HIGH (ref 80.0–100.0)
Platelets: 77 10*3/uL — ABNORMAL LOW (ref 150–400)
RBC: 2.69 MIL/uL — ABNORMAL LOW (ref 3.87–5.11)
RDW: 12.3 % (ref 11.5–15.5)
WBC: 10 10*3/uL (ref 4.0–10.5)
nRBC: 0 % (ref 0.0–0.2)

## 2019-09-24 MED ORDER — LACTATED RINGERS IV SOLN: INTRAVENOUS | Status: AC

## 2019-09-24 NOTE — Progress Notes (Addendum)
   Subjective:   Kaitlyn Powell was seen resting comfortably in her bed this morning. Her sister was at bedside. Sister stated that the patient appeared the most alert she has been in the past few days. Per respiratory therapy did well overnight on bipap.   Objective:  Vital signs in last 24 hours: Vitals:   09/23/19 2101 09/23/19 2329 09/24/19 0000 09/24/19 0334  BP:  (!) 117/59 (!) 108/52 (!) 124/58  Pulse: 91 77 68 60  Resp: 19 17 17 16   Temp:  98.2 F (36.8 C) 98.5 F (36.9 C) 98.1 F (36.7 C)  TempSrc:  Oral  Oral  SpO2: 99% 96% 94% 98%  Weight:    80.7 kg  Height:       Physical Exam  Constitutional: She appears well-developed and well-nourished. No distress.  HENT:  Head: Normocephalic and atraumatic.  Right Ear: External ear normal.  Cardiovascular: Normal rate, regular rhythm and normal heart sounds.  Pulmonary/Chest: Effort normal. No respiratory distress. She has wheezes.  Abdominal: Soft. Bowel sounds are normal. She exhibits no distension. There is no abdominal tenderness.  Skin: She is not diaphoretic.   Assessment/Plan:  Principal Problem:   Aspiration pneumonia (HCC) Active Problems:   Down syndrome   OSA treated with BiPAP   Acquired hypothyroidism   History of pulmonary embolism  Kaitlyn Powell is a 64 y.o female with down syndrome, parkinson's disease, hx of dvt/pe. heartblock with pacemaker who presented with respiratory distress, fever, hypoxia and required bipap on arrival subsequent to aspirating on soup. Found to have bibasilar pneumonitis on Ct scan. Treating for aspiration pneumonia.   Aspiration pneumonia  Patient had midline placed 2/20 and central line removed. She is continuing to be afebrile over the past 24 hrs. Electrolytes wnl and aki resolved.   -LR 145ml/hr for 5 hrs -To get modified barium swallow on Monday 2/22 -continue unasyn day 3/7  -monitor fever curve  -blood cultures 2/19 no growth in 2 bottles - home health pt   Down  Syndrome,Parkinson's Disease: Continue carbidopa levodopa 37.5-150mg  tid, citalopram 20mg  qd, risperidone 0.5mg  bid Seizure disorder: continue IV keppra 1000mg  qhs, 500mg  qam, phenytoin 200mg  bid, cannabidiol 370mg  bid Hypothyroidism: continue Iv levothyroxine 3/19 qd  Hyperlipidemia: continue simvastatin 40mg  qd   Obstructive Sleep apnea: continue cpap  Hx of PE/DVT: continue xarelto 20mg  qd    Dispo: Anticipated discharge in approximately 1-2day(s).   , MD 09/24/2019, 6:17 AM

## 2019-09-24 NOTE — Plan of Care (Signed)
  Problem: Nutrition: Goal: Adequate nutrition will be maintained Outcome: Progressing   Problem: Elimination: Goal: Will not experience complications related to bowel motility Outcome: Progressing Goal: Will not experience complications related to urinary retention Outcome: Progressing   Problem: Safety: Goal: Ability to remain free from injury will improve Outcome: Progressing   Pt starting to eat food each meal. Greater than 75% eaten each meal. Family at bedside to assist with feedings. Pt incontinent at baseline. Bed alarm on at all times.

## 2019-09-24 NOTE — Progress Notes (Signed)
Patient resting comfortably on the bipap. Will start chest PT next rounds.

## 2019-09-24 NOTE — Progress Notes (Signed)
RN placed patient on bipap due to increased WOB. RT called for PRN breathing treatment. Patient given Duoneb through bipap and is resting comfortably. Chest PT held for now. RT will reassess patient later.

## 2019-09-25 ENCOUNTER — Inpatient Hospital Stay (HOSPITAL_COMMUNITY): Payer: Medicare Other

## 2019-09-25 MED ORDER — LEVOTHYROXINE SODIUM 100 MCG/5ML IV SOLN: 150.0000 ug | Freq: Every day | INTRAVENOUS | Status: DC

## 2019-09-25 MED ORDER — LEVOTHYROXINE SODIUM 100 MCG/5ML IV SOLN
75.0000 ug | Freq: Every day | INTRAVENOUS | Status: DC
  Administered 2019-09-26 – 2019-10-12 (×15): 75 ug via INTRAVENOUS
  Filled 2019-09-25 (×17): qty 5

## 2019-09-25 MED ORDER — SODIUM CHLORIDE (HYPERTONIC) 5 % OP OINT
TOPICAL_OINTMENT | Freq: Every day | OPHTHALMIC | Status: DC
  Filled 2019-09-25 (×2): qty 3.5

## 2019-09-25 MED ORDER — LEVETIRACETAM IN NACL 500 MG/100ML IV SOLN
500.0000 mg | Freq: Every day | INTRAVENOUS | Status: DC
  Administered 2019-09-25 – 2019-10-12 (×18): 500 mg via INTRAVENOUS
  Filled 2019-09-25 (×20): qty 100

## 2019-09-25 MED ORDER — LEVETIRACETAM IN NACL 1000 MG/100ML IV SOLN
1000.0000 mg | Freq: Every day | INTRAVENOUS | Status: DC
  Administered 2019-09-25 – 2019-10-11 (×17): 1000 mg via INTRAVENOUS
  Filled 2019-09-25 (×18): qty 100

## 2019-09-25 MED ORDER — IPRATROPIUM-ALBUTEROL 0.5-2.5 (3) MG/3ML IN SOLN
3.0000 mL | Freq: Two times a day (BID) | RESPIRATORY_TRACT | Status: DC
  Administered 2019-09-25 – 2019-09-26 (×3): 3 mL via RESPIRATORY_TRACT
  Filled 2019-09-25 (×3): qty 3

## 2019-09-25 MED ORDER — SODIUM CHLORIDE 0.9 % IV SOLN
200.0000 mg | Freq: Every day | INTRAVENOUS | Status: DC
  Filled 2019-09-25: qty 4

## 2019-09-25 MED ORDER — SODIUM CHLORIDE 0.9 % IV SOLN
200.0000 mg | Freq: Two times a day (BID) | INTRAVENOUS | Status: DC
  Filled 2019-09-25 (×2): qty 4

## 2019-09-25 MED ORDER — PHENYTOIN SODIUM 50 MG/ML IJ SOLN
100.0000 mg | Freq: Two times a day (BID) | INTRAMUSCULAR | Status: DC
  Administered 2019-09-26 (×2): 100 mg via INTRAVENOUS
  Filled 2019-09-25 (×2): qty 2

## 2019-09-25 MED ORDER — SODIUM CHLORIDE 0.9 % IV SOLN
200.0000 mg | Freq: Two times a day (BID) | INTRAVENOUS | Status: AC
  Administered 2019-09-25 (×2): 200 mg via INTRAVENOUS
  Filled 2019-09-25 (×3): qty 4

## 2019-09-25 NOTE — Progress Notes (Addendum)
  Subjective:  Patient seen at bedside this AM. Patient on BiPap, not verbally responsive.  O/N events: Paged by RN this AM at about 0935 that patient has increased respiratory distress after eating this AM. RN with concern for repeat aspiration event. Increased supplemental oxygen requirement to 4 L .  Objective:    Vital Signs (last 24 hours): Vitals:   09/24/19 2057 09/25/19 0000 09/25/19 0348 09/25/19 0400  BP:  (!) 116/53 109/61 (!) 111/58  Pulse: 77 77 69 71  Resp: (!) 22 (!) 23 18 17   Temp:  99.4 F (37.4 C) 98.1 F (36.7 C) 98 F (36.7 C)  TempSrc:  Axillary Axillary   SpO2: 95% 95% 97% 100%  Weight:   79.7 kg   Height:       Physical Exam: General Resting in bed, on BiPap  Cardiac Regular rate and rhythm, no murmurs, rubs, or gallops  Pulmonary Bilateral wheezes, more prominent in upper lungs, airway. No rhonchi, rales  Extremities No peripheral edema   Assessment/Plan:   Principal Problem:   Aspiration pneumonia (HCC) Active Problems:   Down syndrome   OSA treated with BiPAP   Acquired hypothyroidism   History of pulmonary embolism  Patient is a 64 year old female with past medical history significant for Down syndrome, Parkinson's disease, DVT/PE, heart block with pacemaker who presented on 09/21/2019 after aspirating on soup and was found to have bibasilar consolidations on CT scan, and patient is being treated for aspiration pneumonia.  # Aspiration pneumonia: Patient had midline placed on 2/20 and central line removed. Febrile to 100.4 yesterday, WBC of 10.0. There is concern for repeat aspiration events. MBS will be helpful in determining risk for continued events * Will check urinalysis * MBS today if patient able to tolerate - may be difficult to acquire with patient's waxing/waning mental status * Continue unasyn, day 4/7 * Monitor fever curve * Blood culture x2, no growth at 2 days  # Down syndrome # Parkinson's Disease * Continue  carbidopa-levodopa 37.5-150 mg 3 times daily * Citalopram 20 mg daily * Risperidone 0.5 mg twice daily  # Seizure disorder:  * Continue IV Keppra 1000 mg every evening, 500 mg every morning,  * Will give 200 mg twice today and then tomorrow start three times daily dosing - 100 mg, 100 mg, 200 mg * Cannabidiol 370 mg twice daily  # Hypothyroidism: Continue IV levothyroxine 75 mcg daily  # Hyperlipidemia: Continue simvastatin 40 mg daily  # Obstructive sleep apnea: Continue CPAP  # Hx of PE/DVT: Continue xarelto 20 mg daily  # Macrocytic anemia: Hemoglobin of 9.3 today, stable, MCV of 102.6.  * Will check Vitamin B12 and MMA level  PT/OT: Recommends HH PT (patient active with [facility]), 3 N 1, mechanical lift Diet: NPO, will reassess after MBS DVT Ppx: Xarelto 20 mg daily Admit Status: Inpatient Dispo: Anticipated discharge pending clinical improvement  10-22-1994, MD 09/25/2019, 6:33 AM

## 2019-09-25 NOTE — Progress Notes (Signed)
Modified Barium Swallow Progress Note  Patient Details  Name: Kaitlyn Powell MRN: [id number] Date of Birth: January 15, 1956  Today's Date: 09/25/2019  Modified Barium Swallow completed.  Full report located under Chart Review in the Imaging Section.  Brief recommendations include the following:  Clinical Impression  At time of MBS, pt with significant wheezing and work of breath, though O2 saturation reamined WNL. She was also noted to have very dry oral mucosa and needed total assist for neutral head positioning and feeding. She had poor labial closure with a spoon and cup and could not sip from a straw or masticate. All of this is likely very different from her baseline function. Primary impairment seen on MBS was poor oral control with premature spillage and slight delay in swallow initaition with thin liquids. Thin liquids hit the pyriforms with 2 instances of flash frank penetration and two instance of very trace sensed aspiration. Nectar and puree tolerated well, though given dry mucosa, there was some coating/residue in the pharynx. No significant dysphagia or silent aspiration to explain pts presentation today (consumption of breakfast with acute change in respiratory function afterwards). Would recommend continuing PO, with a change to puree diet and nectar thick liquids for added precaution. Pt seems to be at risk for fluctuating function and risk of aspiration may persist with any texture trialed.    Swallow Evaluation Recommendations       SLP Diet Recommendations: Dysphagia 1 (Puree) solids;Nectar thick liquid   Liquid Administration via: Spoon;Cup   Medication Administration: Crushed with puree   Supervision: Full supervision/cueing for compensatory strategies;Full assist for feeding   Compensations: Slow rate;Small sips/bites   Postural Changes: Seated upright at 90 degrees   Oral Care Recommendations: Oral care BID   Other Recommendations: Have oral suction  available    [name], Riley Nearing 09/25/2019,1:32 PM

## 2019-09-25 NOTE — Progress Notes (Signed)
RT NOTE: RT called to bedside to assess patient and place patient on bipap. RT placed patient on bipap and patient seems much more comfortable. RT will continue to monitor.

## 2019-09-25 NOTE — Progress Notes (Signed)
Occupational Therapy Treatment Patient Details Name: Kaitlyn Powell MRN: [id number] DOB: 03/18/1956 Today's Date: 09/25/2019    History of present illness 64 y.o with a hx of Down Syndrome, Parkinson's disease, DVT on Xarelto, heart block with a pacemaker in 2017, hypothyroidism, GERD, seizures, osteoporosis, multiple chronic T-spine fractures, and sleep apnea who presented to the ED with hypoxia and respiratory distress. Dx: aspiration PNA,    OT comments  Pt seen while on bipap, RN stating pt may have had an episode of aspiration with eating breakfast. Assisted pt to sit at EOB with +2 total assist. Addressed sitting balance with posterior lean noted with feet off floor and anterior when feet or on the floor. Pt unable to stand. Will continue to follow.  Follow Up Recommendations  No OT follow up    Equipment Recommendations  Wheelchair (measurements OT);Wheelchair cushion (measurements OT);Hospital bed(hoyer lift)    Recommendations for Other Services      Precautions / Restrictions Precautions Precautions: Fall Precaution Comments: on bipap at night       Mobility Bed Mobility Overal bed mobility: Needs Assistance Bed Mobility: Supine to Sit;Sit to Supine     Supine to sit: +2 for physical assistance;Total assist;HOB elevated Sit to supine: +2 for physical assistance;Total assist   General bed mobility comments: assist for all aspects using bed pad to rotate pt's hips to EOB, +2 total assist to pull up and position pt in bed at end of session  Transfers                 General transfer comment: attempted x 2, pt unable to bear weight    Balance Overall balance assessment: Needs assistance Sitting-balance support: Bilateral upper extremity supported Sitting balance-Leahy Scale: Poor Sitting balance - Comments: posterior lean when feet not touching the floor, forward lean when feet are on the floor      Standing balance-Leahy Scale: Zero Standing balance  comment: unable to stand                           ADL either performed or assessed with clinical judgement   ADL                                               Vision       Perception     Praxis      Cognition Arousal/Alertness: Awake/alert;Lethargic(more alert once EOB) Behavior During Therapy: Flat affect Overall Cognitive Status: History of cognitive impairments - at baseline                                          Exercises     Shoulder Instructions       General Comments      Pertinent Vitals/ Pain       Pain Assessment: Faces Faces Pain Scale: No hurt  Home Living                                          Prior Functioning/Environment              Frequency  Min 2X/week  Progress Toward Goals  OT Goals(current goals can now be found in the care plan section)  Progress towards OT goals: Progressing toward goals  Acute Rehab OT Goals Patient Stated Goal: per sister, to keep her as mobile as possible  OT Goal Formulation: With family Time For Goal Achievement: 10/07/19 Potential to Achieve Goals: Fair  Plan Discharge plan remains appropriate    Co-evaluation    PT/OT/SLP Co-Evaluation/Treatment: Yes Reason for Co-Treatment: Complexity of the patient's impairments (multi-system involvement);For patient/therapist safety   OT goals addressed during session: Strengthening/ROM      AM-PAC OT "6 Clicks" Daily Activity     Outcome Measure   Help from another person eating meals?: Total Help from another person taking care of personal grooming?: Total Help from another person toileting, which includes using toliet, bedpan, or urinal?: Total Help from another person bathing (including washing, rinsing, drying)?: Total Help from another person to put on and taking off regular upper body clothing?: Total Help from another person to put on and taking off regular lower body  clothing?: Total 6 Click Score: 6    End of Session Equipment Utilized During Treatment: Other (comment)(bipap)  OT Visit Diagnosis: Unsteadiness on feet (R26.81);Cognitive communication deficit (R41.841)   Activity Tolerance Patient tolerated treatment well   Patient Left in bed;with call bell/phone within reach   Nurse Communication (may have had an episode of aspiration at breakfast)        Time: 6720-9470 OT Time Calculation (min): 23 min  Charges: OT General Charges $OT Visit: 1 Visit OT Treatments $Therapeutic Activity: 8-22 mins  [name], OTR/L Acute Rehabilitation Services Pager: [phone] Office: [phone]   [name] 09/25/2019, 11:36 AM

## 2019-09-25 NOTE — TOC Initial Note (Addendum)
Transition of Care [facility]) - Initial/Assessment Note    Patient Details  Name: Kaitlyn Powell MRN: [id number] Date of Birth: Jan 16, 1956  Transition of Care [facility] CM/SW Contact:    Cherylann Parr, RN Phone Number: 09/25/2019, 11:15 AM  Clinical Narrative:  Pt has hx of down syndrome and parkinsons.  CM unable to reach pt via phone.  CM was able to reach her sister Kaitlyn Powell.  [name] informed CM that she is pts 24 hour caregiver - pt lives in the home with Kaitlyn Powell and her husband.  Pt already has transport chair - [name] aware that insurance will not cover wheelchair until 5 years after transport chair was provided (approximatly 18 months ago).  Kaitlyn Powell would like to continue to use [facility] - agency contacted and informed that pt is also being recommended for HHOT.  Agency will resume services at discharge - CM request orders.  Kaitlyn Powell would like to discuss with her husband the recommendation of 3:1 and lift prior to agreeing to DME.  TOC will follow up             Expected Discharge Plan: Home w Home Health Services Barriers to Discharge: Continued Medical Work up   Patient Goals and CMS Choice     Choice offered to / list presented to : Sibling([name] her sister is the 24 hour caregiver)  Expected Discharge Plan and Services Expected Discharge Plan: Home w Home Health Services     Post Acute Care Choice: Home Health Living arrangements for the past 2 months: Single Family Home                           HH Arranged: PT, OT, Speech Therapy HH Agency: [facility] Date HH Agency Contacted: 09/25/19 Time HH Agency Contacted: 1103 Representative spoke with at [facility] Agency: Lupita Leash  Prior Living Arrangements/Services Living arrangements for the past 2 months: Single Family Home Lives with:: Siblings Patient language and need for interpreter reviewed:: Yes        Need for Family Participation in Patient Care: Yes (Comment) Care giver support system in place?: Yes (comment) Current  home services: DME(transport chair in the home) Criminal Activity/Legal Involvement Pertinent to Current Situation/Hospitalization: No - Comment as needed  Activities of Daily Living Home Assistive Devices/Equipment: Hospital bed ADL Screening (condition at time of admission) Patient's cognitive ability adequate to safely complete daily activities?: No Is the patient deaf or have difficulty hearing?: Yes Does the patient have difficulty seeing, even when wearing glasses/contacts?: Yes Does the patient have difficulty concentrating, remembering, or making decisions?: Yes Patient able to express need for assistance with ADLs?: No Does the patient have difficulty dressing or bathing?: Yes Independently performs ADLs?: No Communication: Dependent Is this a change from baseline?: Pre-admission baseline Dressing (OT): Dependent Is this a change from baseline?: Pre-admission baseline Grooming: Dependent Is this a change from baseline?: Pre-admission baseline Feeding: Dependent Is this a change from baseline?: Pre-admission baseline Bathing: Dependent Is this a change from baseline?: Pre-admission baseline Toileting: Dependent Is this a change from baseline?: Pre-admission baseline In/Out Bed: Dependent Is this a change from baseline?: Pre-admission baseline Walks in Home: Dependent Is this a change from baseline?: Pre-admission baseline Does the patient have difficulty walking or climbing stairs?: Yes Weakness of Legs: Both Weakness of Arms/Hands: Both  Permission Sought/Granted   Permission granted to share information with : Yes, Verbal Permission Granted  Emotional Assessment       Orientation: : Fluctuating Orientation (Suspected and/or reported [place])   Psych Involvement: No (comment)  Admission diagnosis:  Aspiration pneumonia ([place]) [J69.0] Patient Active Problem List   Diagnosis Date Noted  . Cellulitis of left hand 04/04/2019  . Left hand pain  04/04/2019  . Heart block AV complete ([place]) 01/19/2019  . Shortness of breath 01/19/2019  . Chronic deep vein thrombosis (DVT) of right popliteal vein ([place]) 09/28/2018  . Localization-related (focal) (partial) symptomatic epilepsy and epileptic syndromes with complex partial seizures, intractable, with status epilepticus ([place]) 09/07/2018  . Aspiration pneumonia ([place]) 08/01/2018  . Dehydration 08/01/2018  . Toxic metabolic encephalopathy 18/56/3149  . Functional urinary incontinence 07/22/2017  . OSA treated with BiPAP 12/31/2016  . Mixed conductive and sensorineural hearing loss of both ears 08/05/2016  . Seizures ([place]) 07/03/2016  . Bilateral hearing loss 07/03/2016  . Down syndrome 05/10/2016  . Anemia due to medication 05/10/2016  . Acquired hypothyroidism 05/10/2016  . Elevated lipase 05/10/2016  . History of pulmonary embolism 05/10/2016  . G tube feedings ([place]) 05/10/2016  . Pacemaker 05/10/2016  . S/P percutaneous endoscopic gastrostomy (PEG) tube placement ([place]) 05/10/2016   PCP:  Bernerd Limbo, MD Pharmacy:   [facility] [place], [place] - [address] [place] [address] Phone: [phone] Fax: [phone]  [place] [place] [address] Phone: [phone] Fax: [phone]     Social Determinants of Health (SDOH) Interventions    Readmission Risk Interventions No flowsheet data found.

## 2019-09-25 NOTE — Progress Notes (Signed)
Physical Therapy Treatment Patient Details Name: Kaitlyn Powell MRN: [id number] DOB: 1955-11-19 Today's Date: 09/25/2019    History of Present Illness Pt is a 64 y.o female with PMH of Down Syndrome and Parkinson's disease, admitted 09/21/19 with hypoxia and respiratory distress. Worked up for aspiration PNA. Other PMH includes DVT on Xarelto, heart block (s/p pacemaker 2017), hypothyroidism, seizures, osteoporosis, multiple chronic T-spine fxs, OSA.  PT Comments    Pt seen while on bipap secondary to potential aspiration with hypoxia. Pt required totalA+2 for supine<>sit, able to tolerate prolonged sitting EOB with assist for static balance. Attempted standing, but pt unable to achieve fully upright despite maxA+2. Will continue to follow acutely.    Follow Up Recommendations  Home health PT;Supervision/Assistance - 24 hour     Equipment Recommendations  3in1 (PT);Wheelchair;Wheelchair cushion;Hoyer lift    Recommendations for Other Services       Precautions / Restrictions Precautions Precautions: Fall Precaution Comments: BiPAP Restrictions Weight Bearing Restrictions: No    Mobility  Bed Mobility Overal bed mobility: Needs Assistance Bed Mobility: Supine to Sit;Sit to Supine     Supine to sit: Total assist;+2 for physical assistance;HOB elevated Sit to supine: Total assist;+2 for safety/equipment   General bed mobility comments: assist for all aspects using bed pad to rotate pt's hips to EOB, +2 total assist to pull up and position pt in bed at end of session  Transfers Overall transfer level: Needs assistance               General transfer comment: Attempted 2x standing from EOB, pt unable to achieve fully upright posture despite maxA+2; totalA to partially stand and scoot hips towards EOB 2x with use of bed pad  Ambulation/Gait                 Stairs             Wheelchair Mobility    Modified Rankin (Stroke Patients Only)        Balance Overall balance assessment: Needs assistance Sitting-balance support: Bilateral upper extremity supported Sitting balance-Leahy Scale: Poor Sitting balance - Comments: posterior lean when feet not touching the floor, forward lean when feet are on the floor      Standing balance-Leahy Scale: Zero Standing balance comment: unable to stand                            Cognition Arousal/Alertness: Awake/alert;Lethargic Behavior During Therapy: Flat affect Overall Cognitive Status: History of cognitive impairments - at baseline                                 General Comments: More alert once sitting EOB. Not answering majority of questions (limited by bipap) except 2x verbalizations difficult to understand. Not following simple commands      Exercises      General Comments General comments (skin integrity, edema, etc.): SpO2 98% on BiPA, post-mobility BP 120/58, HR 92      Pertinent Vitals/Pain Pain Assessment: Faces Faces Pain Scale: No hurt Pain Intervention(s): Monitored during session    Home Living                      Prior Function            PT Goals (current goals can now be found in the care plan section) Acute Rehab PT Goals Patient Stated  Goal: per sister, to keep her as mobile as possible  PT Goal Formulation: With patient/family Progress towards PT goals: Progressing toward goals    Frequency    Min 2X/week      PT Plan Current plan remains appropriate    Co-evaluation PT/OT/SLP Co-Evaluation/Treatment: Yes Reason for Co-Treatment: Complexity of the patient's impairments (multi-system involvement);For patient/therapist safety PT goals addressed during session: Mobility/safety with mobility OT goals addressed during session: Strengthening/ROM      AM-PAC PT "6 Clicks" Mobility   Outcome Measure  Help needed turning from your back to your side while in a flat bed without using bedrails?: Total Help  needed moving from lying on your back to sitting on the side of a flat bed without using bedrails?: Total Help needed moving to and from a bed to a chair (including a wheelchair)?: Total Help needed standing up from a chair using your arms (e.g., wheelchair or bedside chair)?: Total Help needed to walk in hospital room?: Total Help needed climbing 3-5 steps with a railing? : Total 6 Click Score: 6    End of Session Equipment Utilized During Treatment: Oxygen Activity Tolerance: Patient limited by lethargy Patient left: in bed;with call bell/phone within reach;with bed alarm set Nurse Communication: Mobility status;Need for lift equipment PT Visit Diagnosis: Muscle weakness (generalized) (M62.81);Other symptoms and signs involving the nervous system (R29.898)     Time: 1016-1040 PT Time Calculation (min) (ACUTE ONLY): 24 min  Charges:  $Therapeutic Activity: 8-22 mins                    [name], PT, DPT Acute Rehabilitation Services  Pager [phone] Office [phone]  [name] 09/25/2019, 12:20 PM

## 2019-09-25 NOTE — Progress Notes (Signed)
Family member was feeding patient. No coughing or drooling or watering eyes present. Shortly after, Patient began to desat down to a low of 85%. Exp wheezing present now, audible without auscultation. Belly breathing present as well. PO meds held and provider notified. PRN neb treatment given and saturations improved to  94% during treatment. Will continue to monitor. Patient has orders for video swallow today.

## 2019-09-26 LAB — URINALYSIS, ROUTINE W REFLEX MICROSCOPIC
Bacteria, UA: NONE SEEN
Bilirubin Urine: NEGATIVE
Glucose, UA: NEGATIVE mg/dL
Ketones, ur: NEGATIVE mg/dL
Leukocytes,Ua: NEGATIVE
Nitrite: NEGATIVE
Protein, ur: 30 mg/dL — AB
Specific Gravity, Urine: 1.02 (ref 1.005–1.030)
pH: 5 (ref 5.0–8.0)

## 2019-09-26 LAB — CBC
HCT: 26.2 % — ABNORMAL LOW (ref 36.0–46.0)
Hemoglobin: 8.5 g/dL — ABNORMAL LOW (ref 12.0–15.0)
MCH: 33.6 pg (ref 26.0–34.0)
MCHC: 32.4 g/dL (ref 30.0–36.0)
MCV: 103.6 fL — ABNORMAL HIGH (ref 80.0–100.0)
Platelets: 110 10*3/uL — ABNORMAL LOW (ref 150–400)
RBC: 2.53 MIL/uL — ABNORMAL LOW (ref 3.87–5.11)
RDW: 12.6 % (ref 11.5–15.5)
WBC: 5.7 10*3/uL (ref 4.0–10.5)
nRBC: 0 % (ref 0.0–0.2)

## 2019-09-26 LAB — BASIC METABOLIC PANEL
Anion gap: 9 (ref 5–15)
BUN: 11 mg/dL (ref 8–23)
CO2: 27 mmol/L (ref 22–32)
Calcium: 7.9 mg/dL — ABNORMAL LOW (ref 8.9–10.3)
Chloride: 105 mmol/L (ref 98–111)
Creatinine, Ser: 0.7 mg/dL (ref 0.44–1.00)
GFR calc Af Amer: >60 mL/min (ref >60)
GFR calc non Af Amer: >60 mL/min (ref >60)
Glucose, Bld: 92 mg/dL (ref 70–99)
Potassium: 3.9 mmol/L (ref 3.5–5.1)
Sodium: 141 mmol/L (ref 135–145)

## 2019-09-26 LAB — VITAMIN B12: Vitamin B-12: 532 pg/mL (ref 180–914)

## 2019-09-26 MED ORDER — PHENYTOIN 50 MG PO CHEW
100.0000 mg | CHEWABLE_TABLET | Freq: Two times a day (BID) | ORAL | Status: DC
  Administered 2019-09-27 (×2): 100 mg via ORAL
  Filled 2019-09-26 (×3): qty 2

## 2019-09-26 MED ORDER — IPRATROPIUM-ALBUTEROL 0.5-2.5 (3) MG/3ML IN SOLN
3.0000 mL | Freq: Four times a day (QID) | RESPIRATORY_TRACT | Status: DC
  Administered 2019-09-26 – 2019-10-07 (×45): 3 mL via RESPIRATORY_TRACT
  Filled 2019-09-26 (×47): qty 3

## 2019-09-26 MED ORDER — RESOURCE THICKENUP CLEAR PO POWD
ORAL | Status: DC | PRN
  Filled 2019-09-26: qty 125

## 2019-09-26 MED ORDER — PHENYTOIN 50 MG PO CHEW
200.0000 mg | CHEWABLE_TABLET | Freq: Every day | ORAL | Status: DC
  Administered 2019-09-26: 200 mg via ORAL
  Filled 2019-09-26 (×2): qty 4

## 2019-09-26 NOTE — Progress Notes (Deleted)
Pt with intermittent fever.

## 2019-09-26 NOTE — Progress Notes (Signed)
RT NOTE: RT placed patient on bipap due to work of breathing. Patient seems more comfortable on bipap. RN aware. RT will continue to monitor.

## 2019-09-26 NOTE — Progress Notes (Signed)
  Speech Language Pathology Treatment: Dysphagia  Patient Details Name: Kaitlyn Powell MRN: [id number] DOB: 10-Nov-1955 Today's Date: 09/26/2019 Time: 0017-4944 SLP Time Calculation (min) (ACUTE ONLY): 19 min  Assessment / Plan / Recommendation Clinical Impression  Pt sleeping, currently on BiPAP- session focused on review of yesterday's MBS and dysphagia education with pt's sister, [name]. Reviewed video and overall functional swallowing with insignificant, trace aspiration.  We discussed the benefits of a more conservative diet given pt's fluctuating condition and the value of aspiration, when it does occur, being accompanied by a cough so that family can better gauge Jowanna's toleration of diet.  Recommend continuing dysphagia 1/pureed diet with nectar thick liquids- [name] agrees with plan.   HPI HPI: 64 y.o with a hx of Down Syndrome, recent dx Parkinson's disease, DVT, heart block with a pacemaker in 2017, hypothyroidism, GERD, seizures, osteoporosis, multiple chronic T-spine fractures, and sleep apnea who presented to the ED with hypoxia and respiratory distress. Pt with increased respiratory effort and SOB, appeared to aspirate when eating soup.  Arrived to ED requiring CPAP and weaned to 10L nasal cannula.  Per notes, pt has hx of aspiration pna, remote hx PEG (removed 2017 after swallowing issues were resolved).  Evaluated by this SLP on 08/02/18, at which time she presented with quite functional swallow with no overt concerns for aspiration - soft diet and thin liquids were recommended.  Her sister reports slight worsening in swallow function the last few months.       SLP Plan  Continue with current plan of care       Recommendations  Diet recommendations: Dysphagia 1 (puree);Nectar-thick liquid Liquids provided via: Cup Medication Administration: Crushed with puree Supervision: Staff to assist with self feeding Compensations: Slow rate;Small sips/bites                Oral Care  Recommendations: Oral care BID Follow up Recommendations: 24 hour supervision/assistance SLP Visit Diagnosis: Dysphagia, oropharyngeal phase (R13.12) Plan: Continue with current plan of care       GO                Blenda Mounts Laurice 09/26/2019, 4:16 PM  Akeria Hedstrom L. Samson Frederic, MA CCC/SLP Acute Rehabilitation Services Office number [phone] Pager [phone]

## 2019-09-26 NOTE — Progress Notes (Signed)
RT NOTE: RT placed patient on 2L [place]. Patient tolerating well at this time. RT will continue to monitor.

## 2019-09-26 NOTE — Progress Notes (Signed)
Subjective:  Patient seen at bedside. Responsive to voice but non-verbal.   Objective:    Vital Signs (last 24 hours): Vitals:   09/26/19 0307 09/26/19 0400 09/26/19 0505 09/26/19 0511  BP: (!) 123/55 119/61    Pulse: 71 70 70 66  Resp:  18 (!) 21 20  Temp:      TempSrc:      SpO2: 99% 97% 95% 96%  Weight: 83.3 kg     Height:       Physical Exam: General Resting in bed, on 2 L [place]  Pulmonary Breathing comfortably on 2 L [place]. Mild bilateral wheezes, no rhonchi, crackles  Extremities No peripheral edema, warm  Neuro Tracks with eyes, no gross deficit    CBC Latest Ref Rng & Units 09/26/2019 09/24/2019 09/22/2019  WBC 4.0 - 10.5 K/uL 5.7 10.0 11.4(H)  Hemoglobin 12.0 - 15.0 g/dL 8.5(L) 9.3(L) 8.8(L)  Hematocrit 36.0 - 46.0 % 26.2(L) 27.6(L) 25.4(L)  Platelets 150 - 400 K/uL 110(L) 77(L) 57(L)   BMP Latest Ref Rng & Units 09/26/2019 09/24/2019 09/23/2019  Glucose 70 - 99 mg/dL 92 81 95  BUN 8 - 23 mg/dL 11 13 16   Creatinine 0.44 - 1.00 mg/dL 0.70 0.68 1.09(H)  BUN/Creat Ratio 12 - 28 - - -  Sodium 135 - 145 mmol/L 141 136 133(L)  Potassium 3.5 - 5.1 mmol/L 3.9 3.7 3.8  Chloride 98 - 111 mmol/L 105 105 100  CO2 22 - 32 mmol/L 27 24 25   Calcium 8.9 - 10.3 mg/dL 7.9(L) 7.6(L) 7.8(L)   Urinalysis    Component Value Date/Time   COLORURINE YELLOW 09/26/2019 0632   APPEARANCEUR CLEAR 09/26/2019 0632   APPEARANCEUR Turbid (A) 06/21/2018 1014   LABSPEC 1.020 09/26/2019 0632   PHURINE 5.0 09/26/2019 0632   GLUCOSEU NEGATIVE 09/26/2019 0632   HGBUR SMALL (A) 09/26/2019 0632   BILIRUBINUR NEGATIVE 09/26/2019 0632   BILIRUBINUR Negative 06/21/2018 [place] 09/26/2019 0632   PROTEINUR 30 (A) 09/26/2019 0632   NITRITE NEGATIVE 09/26/2019 0632   LEUKOCYTESUR NEGATIVE 09/26/2019 1937    Assessment/Plan:   Principal Problem:   Aspiration pneumonia (HCC) Active Problems:   Down syndrome   OSA treated with BiPAP   Acquired hypothyroidism   History of pulmonary  embolism  Patient is a 64 year old female with past medical history significant for Down syndrome, Parkinson's disease, DVT/PE, heart block with pacemaker who presented on 09/21/2019 after aspirating on soup and was found to have bibasilar consolidations on CT scan, and patient is being treated for aspiration pneumonia.  # Aspiration pneumonia: Patient had midline placed on 2/20 and central line removed. Febrile to 100.4 again yesterday, WBC of 10.0. MBS yesterday with no clinically significant aspiration. UA with negative nitrite/leukocytes, bacteria.  * Continue unasyn, day 5/7 * Monitor fever curve * Blood culture x2, no growth at 3 days  # Down syndrome # Parkinson's Disease * Continue carbidopa-levodopa 37.5-150 mg 3 times daily * Citalopram 20 mg daily * Risperidone 0.5 mg twice daily   # Seizure disorder: * Continue IV keppra 1000 mg every evening, 500 mg every morning * Phenytoin three times daily - 100 mg, 100 mg, 200 mg * Cannabidiol 370 mg twice daily  # Hypothyroidism: Continue IV levothyroxine 75 mcg daily  # Hyperlipidemia: Continue simvastatin 40 mg daily  # Obstructive sleep apnea: Continue CPAP  # Hx of PE/DVT: Continue xarelto 20 mg daily  # Macrocytic anemia: Hemoglobin of 9.3 today, stable, MCV of 102.6 * Vitamin B12  WNL and MMA level pending  PT/OT: Recommends HH PT (patient active with [facility]), 3 N 1, mechanical lift Diet: Dysphagia 1 with nectar thick liquids, crushed meds DVT Ppx: Xarelto 20 mg daily Admit Status: Inpatient Dispo: Anticipated discharge in approximately 2-3 days  Katherine Roan, MD 09/26/2019, 7:14 AM

## 2019-09-26 NOTE — Progress Notes (Signed)
Patient was asleep and placed on BIpap.  Respiratory meds given in line.

## 2019-09-27 ENCOUNTER — Inpatient Hospital Stay (HOSPITAL_COMMUNITY): Payer: Medicare Other

## 2019-09-27 LAB — CULTURE, BLOOD (ROUTINE X 2)
Culture: NO GROWTH
Culture: NO GROWTH
Special Requests: ADEQUATE
Special Requests: ADEQUATE

## 2019-09-27 LAB — BASIC METABOLIC PANEL
Anion gap: 9 (ref 5–15)
BUN: 12 mg/dL (ref 8–23)
CO2: 28 mmol/L (ref 22–32)
Calcium: 8 mg/dL — ABNORMAL LOW (ref 8.9–10.3)
Chloride: 108 mmol/L (ref 98–111)
Creatinine, Ser: 0.84 mg/dL (ref 0.44–1.00)
GFR calc Af Amer: >60 mL/min (ref >60)
GFR calc non Af Amer: >60 mL/min (ref >60)
Glucose, Bld: 110 mg/dL — ABNORMAL HIGH (ref 70–99)
Potassium: 3.8 mmol/L (ref 3.5–5.1)
Sodium: 145 mmol/L (ref 135–145)

## 2019-09-27 LAB — GLUCOSE, CAPILLARY: Glucose-Capillary: 92 mg/dL (ref 70–99)

## 2019-09-27 MED ORDER — PHENYTOIN SODIUM 50 MG/ML IJ SOLN: 100.0000 mg | Freq: Two times a day (BID) | INTRAMUSCULAR | Status: DC

## 2019-09-27 MED ORDER — SODIUM CHLORIDE 0.9 % IV SOLN
200.0000 mg | Freq: Once | INTRAVENOUS | Status: AC
  Administered 2019-09-27: 200 mg via INTRAVENOUS
  Filled 2019-09-27: qty 4

## 2019-09-27 NOTE — Plan of Care (Signed)
  Problem: Activity: Goal: Risk for activity intolerance will decrease Outcome: Not Progressing   

## 2019-09-27 NOTE — Progress Notes (Signed)
  Subjective:  Patient seen at bedside, sister and brother-in-law present. Family states patient with decreased responsiveness, increased respiratory requirement.   Objective:    Vital Signs (last 24 hours): Vitals:   09/27/19 0000 09/27/19 0400 09/27/19 0500 09/27/19 0623  BP: (!) 138/58 (!) 133/50    Pulse: 84 89    Resp: (!) 22 (!) 23    Temp:    99.5 F (37.5 C)  TempSrc:    Axillary  SpO2: 94% 95%    Weight:   81.5 kg   Height:        Physical Exam: General Resting in bed  Cardiac Regular rate and rhythm, no murmurs, rubs, or gallops  Pulmonary Mild wheezing bilaterally, no cough, rales, rhonchi  Abdominal Soft, non-tender, without distention    BMP Latest Ref Rng & Units 09/27/2019 09/26/2019 09/24/2019  Glucose 70 - 99 mg/dL 662(H) 92 81  BUN 8 - 23 mg/dL 12 11 13   Creatinine 0.44 - 1.00 mg/dL 4.76 5.46  BUN/Creat Ratio 12 - 28 - - -  Sodium 135 - 145 mmol/L 145 141 136  Potassium 3.5 - 5.1 mmol/L 3.8 3.9 3.7  Chloride 98 - 111 mmol/L 108 105 105  CO2 22 - 32 mmol/L 28 27 24   Calcium 8.9 - 10.3 mg/dL 8.0(L) 7.9(L) 7.6(L)    Assessment/Plan:   Principal Problem:   Aspiration pneumonia (HCC) Active Problems:   Down syndrome   OSA treated with BiPAP   Acquired hypothyroidism   History of pulmonary embolism  Patient is a 64 year old female with past medical history significant for Down syndrome, Parkinson's disease, DVT/PE, heart block with pacemaker who presented on 09/21/2019 after aspirating on soup and was found to have bibasilar consolidations on CT scan, patient is being treated for aspiration pneumonia.  # Aspiration pneumonia: Patient had midline placed on 2/20 and central line removed.  Patient febrile again to 100.5 yesterday, MBS on 2/22 without clinically significant aspiration. UA with negative nitrite/leukocytes, bacteria. Continue low-grade fevers likely 2/2 aspiration pneumonitis. Repeat CXR with increased bibasilar opacities, which may be 2/2 to  repeat aspiration. * Continue unasyn, day 6/7 * Monitor fever curve * Blood culture x2, no growth at 4 days  # Down syndrome # Parkinson's Disease * Continue carbidopa-levodopa 37.5-150 mg 3 times daily * Citalopram 20 mg daily * Risperidone 0.5 mg twice daily   # Seizure disorder: * Continue IV keppra 1000 mg every evening, 500 mg every morning * Phenytoin three times daily - 100 mg, 100 mg, 200 mg (oral) * Cannabidiol 370 mg twice daily  # Hypothyroidism: Continue IV levothyroxine 75 mcg daily  # Hyperlipidemia: Continue simvastatin 40 mg daily  # Obstructive sleep apnea: Continue CPAP  # Hx of PE/DVT: Continue xarelto 20 mg daily. Per pharmacy, there is interaction with this medication and phenytoin. Patient not good candidate for warfarin. Medication adjustment can be considered as outpatient.  # Macrocytic anemia: Hemoglobin of 9.3 today, stable, MCV of 102.6 * Vitamin B12 WNL and MMA level pending  PT/OT: Recommends HH PT (patient active with [facility]), 3 N 1, mechanical lift Diet: Dysphagia 1 with nectar thick liquids, crushed meds DVT Ppx: Xarelto 20 mg daily Admit Status: Inpatient Dispo: Anticipated discharge pending clinical improvement  8/7, MD 09/27/2019, 6:30 AM

## 2019-09-28 DIAGNOSIS — J690 Pneumonitis due to inhalation of food and vomit: Principal | ICD-10-CM

## 2019-09-28 DIAGNOSIS — G4733 Obstructive sleep apnea (adult) (pediatric): Secondary | ICD-10-CM

## 2019-09-28 DIAGNOSIS — J9622 Acute and chronic respiratory failure with hypercapnia: Secondary | ICD-10-CM

## 2019-09-28 DIAGNOSIS — Z86711 Personal history of pulmonary embolism: Secondary | ICD-10-CM

## 2019-09-28 DIAGNOSIS — J9621 Acute and chronic respiratory failure with hypoxia: Secondary | ICD-10-CM

## 2019-09-28 LAB — CBC
HCT: 25.3 % — ABNORMAL LOW (ref 36.0–46.0)
Hemoglobin: 8.2 g/dL — ABNORMAL LOW (ref 12.0–15.0)
MCH: 34 pg (ref 26.0–34.0)
MCHC: 32.4 g/dL (ref 30.0–36.0)
MCV: 105 fL — ABNORMAL HIGH (ref 80.0–100.0)
Platelets: 129 10*3/uL — ABNORMAL LOW (ref 150–400)
RBC: 2.41 MIL/uL — ABNORMAL LOW (ref 3.87–5.11)
RDW: 13.2 % (ref 11.5–15.5)
WBC: 6.7 10*3/uL (ref 4.0–10.5)
nRBC: 0 % (ref 0.0–0.2)

## 2019-09-28 LAB — BASIC METABOLIC PANEL
Anion gap: 10 (ref 5–15)
BUN: 13 mg/dL (ref 8–23)
CO2: 28 mmol/L (ref 22–32)
Calcium: 7.9 mg/dL — ABNORMAL LOW (ref 8.9–10.3)
Chloride: 105 mmol/L (ref 98–111)
Creatinine, Ser: 0.77 mg/dL (ref 0.44–1.00)
GFR calc Af Amer: >60 mL/min (ref >60)
GFR calc non Af Amer: >60 mL/min (ref >60)
Glucose, Bld: 99 mg/dL (ref 70–99)
Potassium: 3.5 mmol/L (ref 3.5–5.1)
Sodium: 143 mmol/L (ref 135–145)

## 2019-09-28 LAB — BLOOD GAS, VENOUS
Acid-Base Excess: 7.3 mmol/L — ABNORMAL HIGH (ref 0.0–2.0)
Bicarbonate: 32.5 mmol/L — ABNORMAL HIGH (ref 20.0–28.0)
FIO2: 35
O2 Saturation: 43.3 %
Patient temperature: 37
pCO2, Ven: 57.7 mmHg (ref 44.0–60.0)
pH, Ven: 7.37 (ref 7.250–7.430)
pO2, Ven: <32 mmHg — CL (ref 32.0–45.0)

## 2019-09-28 LAB — BRAIN NATRIURETIC PEPTIDE: B Natriuretic Peptide: 230.3 pg/mL — ABNORMAL HIGH (ref 0.0–100.0)

## 2019-09-28 MED ORDER — SODIUM CHLORIDE 0.9 % IV SOLN
3.0000 g | Freq: Four times a day (QID) | INTRAVENOUS | Status: DC
  Administered 2019-09-28: 3 g via INTRAVENOUS
  Filled 2019-09-28 (×2): qty 8

## 2019-09-28 MED ORDER — VANCOMYCIN HCL 1750 MG/350ML IV SOLN
1750.0000 mg | Freq: Once | INTRAVENOUS | Status: DC
  Filled 2019-09-28: qty 350

## 2019-09-28 MED ORDER — SODIUM CHLORIDE 0.9 % IV SOLN
1.0000 g | Freq: Three times a day (TID) | INTRAVENOUS | Status: DC
  Administered 2019-09-29 (×3): 1 g via INTRAVENOUS
  Filled 2019-09-28 (×5): qty 1

## 2019-09-28 MED ORDER — ENOXAPARIN SODIUM 80 MG/0.8ML ~~LOC~~ SOLN
1.0000 mg/kg | Freq: Two times a day (BID) | SUBCUTANEOUS | Status: AC
  Administered 2019-09-28 – 2019-10-03 (×10): 80 mg via SUBCUTANEOUS
  Filled 2019-09-28 (×11): qty 0.8

## 2019-09-28 MED ORDER — LACTATED RINGERS IV BOLUS
1000.0000 mL | Freq: Once | INTRAVENOUS | Status: AC
  Administered 2019-09-28: 1000 mL via INTRAVENOUS

## 2019-09-28 MED ORDER — VANCOMYCIN HCL 1250 MG/250ML IV SOLN: 1250.0000 mg | INTRAVENOUS | Status: DC

## 2019-09-28 MED ORDER — SODIUM CHLORIDE 0.9 % IV SOLN
200.0000 mg | Freq: Two times a day (BID) | INTRAVENOUS | Status: DC
  Administered 2019-09-28: 200 mg via INTRAVENOUS
  Filled 2019-09-28 (×2): qty 4

## 2019-09-28 MED ORDER — LACTATED RINGERS IV SOLN: INTRAVENOUS | Status: DC

## 2019-09-28 MED ORDER — SODIUM CHLORIDE 0.9 % IV SOLN
130.0000 mg | Freq: Three times a day (TID) | INTRAVENOUS | Status: DC
  Filled 2019-09-28 (×3): qty 2.6

## 2019-09-28 MED ORDER — FUROSEMIDE 10 MG/ML IJ SOLN
20.0000 mg | Freq: Once | INTRAMUSCULAR | Status: AC
  Administered 2019-09-28: 20 mg via INTRAVENOUS
  Filled 2019-09-28: qty 2

## 2019-09-28 MED ORDER — SODIUM CHLORIDE 0.9 % IV SOLN
130.0000 mg | Freq: Three times a day (TID) | INTRAVENOUS | Status: DC
  Administered 2019-09-28 – 2019-10-02 (×11): 130 mg via INTRAVENOUS
  Filled 2019-09-28 (×18): qty 2.6

## 2019-09-28 MED ORDER — SODIUM CHLORIDE 0.9 % IV SOLN
2.0000 g | Freq: Three times a day (TID) | INTRAVENOUS | Status: DC
  Filled 2019-09-28 (×2): qty 2

## 2019-09-28 MED ORDER — METRONIDAZOLE IN NACL 5-0.79 MG/ML-% IV SOLN: 500.0000 mg | Freq: Three times a day (TID) | INTRAVENOUS | Status: DC

## 2019-09-28 MED ORDER — VANCOMYCIN HCL 1250 MG/250ML IV SOLN
1250.0000 mg | INTRAVENOUS | Status: DC
  Administered 2019-09-29: 1250 mg via INTRAVENOUS
  Filled 2019-09-28: qty 250

## 2019-09-28 NOTE — Plan of Care (Signed)
Patient seen and examined.  Orders placed on the chart.  Full consult note to follow.  Steffanie Dunn, DO 09/28/19 2:24 PM [facility]

## 2019-09-28 NOTE — Progress Notes (Signed)
MEDICATION RELATED CONSULT NOTE  Pharmacy Consult for phenytoin, enoxaparin Indication: hx of seizures   Allergies  Allergen Reactions  . Cephalexin Other (See Comments)    Seizures   . Namenda [Memantine Hcl] Other (See Comments)    Made memory issues worse, sleepiness, increased confusion    Patient Measurements: Height: 4\' 10"  (147.3 cm) Weight: 180 lb 5.4 oz (81.8 kg) IBW/kg (Calculated) : 40.9  Vital Signs: Temp: 99.5 F (37.5 C) (02/25 0808) Temp Source: Axillary (02/25 0808) BP: 101/85 (02/25 1105) Pulse Rate: 81 (02/25 1105) Intake/Output from previous day: 02/24 0701 - 02/25 0700 In: 400 [IV Piggyback:400] Out: 1100 [Urine:1100] Intake/Output from this shift: Total I/O In: -  Out: 525 [Urine:525]  Labs: Recent Labs    09/26/19 0326 09/27/19 0317 09/28/19 0300  WBC 5.7  --  6.7  HGB 8.5*  --  8.2*  HCT 26.2*  --  25.3*  PLT 110*  --  129*  CREATININE 0.70 0.84 0.77   Estimated Creatinine Clearance: 65.1 mL/min (by C-G formula based on SCr of 0.77 mg/dL).  Assessment: 64 yo f with hx of dvt and seizures.  Currently she is unable to take PO, so her phenytoin needs to be converted to IV  Also, major interaction between xarelto and phenytoin  Renal fx and cbc stable  Plan:  Dc xarelto  Enoxaparin 1 mg/kg q12h @ 1600 Phenytoin 130 mg IV q8h Monitor lvls prn  64, PharmD, BCPS, BCCCP Clinical Pharmacist [phone]  Please check AMION for all [facility] numbers  09/28/2019 1:08 PM

## 2019-09-28 NOTE — Progress Notes (Signed)
Physical Therapy Treatment Patient Details Name: Kaitlyn Powell MRN: [id number] DOB: 20-May-1956 Today's Date: 09/28/2019    History of Present Illness Pt is a 64 y.o female with PMH of Down Syndrome and Parkinson's disease, admitted 09/21/19 with hypoxia and respiratory distress. Worked up for aspiration PNA. Other PMH includes DVT on Xarelto, heart block (s/p pacemaker 2017), hypothyroidism, seizures, osteoporosis, multiple chronic T-spine fxs, OSA.   PT Comments    Pt seen for mobility progression. Today's session focused on trunk control and core activation in sitting EOB and long sitting activity. Pt with very poor balance strategies requiring min-totalA. Sister present and supportive; discussed DME needs (see below). SpO2 98% on 5L O2 [place].   Follow Up Recommendations  Home health PT;Supervision/Assistance - 24 hour     Equipment Recommendations  3in1 (PT);Hospital bed;Other (comment)(hoyer lift)    Recommendations for Other Services       Precautions / Restrictions Precautions Precautions: Fall Precaution Comments: bipap vs 5L 02 Restrictions Weight Bearing Restrictions: No    Mobility  Bed Mobility Overal bed mobility: Needs Assistance Bed Mobility: Supine to Sit;Sit to Supine     Supine to sit: Total assist;+2 for physical assistance Sit to supine: +2 for physical assistance;Total assist   General bed mobility comments: assist for all aspects using bed pad to rotate pt's hips to EOB, +2 total assist to go from supine to ring sitting x 2, +2 total assist to pull up in bed at end of session  Transfers                 General transfer comment: will require lift  Ambulation/Gait                 Stairs             Wheelchair Mobility    Modified Rankin (Stroke Patients Only)       Balance Overall balance assessment: Needs assistance Sitting-balance support: Bilateral upper extremity supported Sitting balance-Leahy Scale: Poor Sitting  balance - Comments: posterior and L side lean, resistant to positioning pt leaning toward R                                    Cognition Arousal/Alertness: Awake/alert Behavior During Therapy: Flat affect Overall Cognitive Status: History of cognitive impairments - at baseline                                 General Comments: awakened for therapy, but became alert quickly, closing eyes at end of session and returned to supine      Exercises      General Comments General comments (skin integrity, edema, etc.): SpO2 98% on 5L O2 [place]. Sister present and supportive      Pertinent Vitals/Pain Pain Assessment: Faces Faces Pain Scale: No hurt Pain Intervention(s): Monitored during session    Home Living                      Prior Function            PT Goals (current goals can now be found in the care plan section) Acute Rehab PT Goals Patient Stated Goal: per sister, to keep her as mobile as possible  Progress towards PT goals: Progressing toward goals    Frequency    Min 2X/week  PT Plan Current plan remains appropriate    Co-evaluation PT/OT/SLP Co-Evaluation/Treatment: Yes Reason for Co-Treatment: For patient/therapist safety PT goals addressed during session: Mobility/safety with mobility;Strengthening/ROM OT goals addressed during session: Strengthening/ROM      AM-PAC PT "6 Clicks" Mobility   Outcome Measure  Help needed turning from your back to your side while in a flat bed without using bedrails?: Total Help needed moving from lying on your back to sitting on the side of a flat bed without using bedrails?: Total Help needed moving to and from a bed to a chair (including a wheelchair)?: Total Help needed standing up from a chair using your arms (e.g., wheelchair or bedside chair)?: Total Help needed to walk in hospital room?: Total Help needed climbing 3-5 steps with a railing? : Total 6 Click Score: 6    End  of Session Equipment Utilized During Treatment: Oxygen Activity Tolerance: Patient tolerated treatment well Patient left: in bed;with call bell/phone within reach;with bed alarm set;with family/visitor present Nurse Communication: Mobility status;Need for lift equipment PT Visit Diagnosis: Muscle weakness (generalized) (M62.81);Other symptoms and signs involving the nervous system (R29.898)     Time: 6712-4580 PT Time Calculation (min) (ACUTE ONLY): 34 min  Charges:  $Therapeutic Activity: 8-22 mins                     [name], PT, DPT Acute Rehabilitation Services  Pager [phone] Office [place] 09/28/2019, 4:52 PM

## 2019-09-28 NOTE — Progress Notes (Addendum)
PO meds were held. When attempting to transition patient from bipap to 6L of 02, pt was desating from late 75s to low 80s. Pt was put back on bipap because she could not tolerate the O2. O2 came back up to 98-100% pn the bipap. Physician was notified and came by to assess patient.

## 2019-09-28 NOTE — Progress Notes (Signed)
  Speech Language Pathology Treatment: Dysphagia  Patient Details Name: Kaitlyn Powell MRN: [id number] DOB: 10-04-1955 Today's Date: 09/28/2019 Time: 3382-5053 SLP Time Calculation (min) (ACUTE ONLY): 24 min  Assessment / Plan / Recommendation Clinical Impression  Mellie seen with sister [name] at bedside and RN. [name] reported she was alert this morning (first time that alert since last week) and ate breakfast, went back on Bipap. Time for pt to have meds when this therapist arrived and RN moved Bipap and increased nasal cannula O2 slightly to keep sats 90% and above. Oral hygiene provided followed by teaspoon sips nectar thick juice- due to pt's head extension, teaspoon presentations are were safer. As expected she appeared to have a weak pharyngeal swallow (subjective observation), audible swallow. Therapist provided trials slowly allowing additional time as she performed reflexive sub swallows. She appeared that she was going to cough once but her may not have enough respiratory effort. [name] reported she is comfortable thickening pt's liquids. Continued education with sister that pt will continue to have aspiration risks despite compensation, which she comprehends and ensuring aspiration precautions is important to mitigate risk although she is fragile in her current state, requiring Bipap intermittently etc. Pt was receiving home health ST services prior to admission and sister is eager for her to continue with ST if beneficial given newer Parkinson's diagnosis. Will provide another session for continued education and will discharge once intervention is no longer skilled.       HPI HPI: 64 y.o with a hx of Down Syndrome, recent dx Parkinson's disease, DVT, heart block with a pacemaker in 2017, hypothyroidism, GERD, seizures, osteoporosis, multiple chronic T-spine fractures, and sleep apnea who presented to the ED with hypoxia and respiratory distress. Pt with increased respiratory effort and SOB,  appeared to aspirate when eating soup.  Arrived to ED requiring CPAP and weaned to 10L nasal cannula.  Per notes, pt has hx of aspiration pna, remote hx PEG (removed 2017 after swallowing issues were resolved).  Evaluated by this SLP on 08/02/18, at which time she presented with quite functional swallow with no overt concerns for aspiration - soft diet and thin liquids were recommended.  Her sister reports slight worsening in swallow function the last few months.       SLP Plan  Continue with current plan of care       Recommendations  Diet recommendations: Dysphagia 1 (puree);Nectar-thick liquid Liquids provided via: Cup;Teaspoon Medication Administration: Crushed with puree Supervision: Staff to assist with self feeding;Full supervision/cueing for compensatory strategies Compensations: Slow rate;Small sips/bites Postural Changes and/or Swallow Maneuvers: Seated upright 90 degrees                Oral Care Recommendations: Oral care BID Follow up Recommendations: Home health SLP SLP Visit Diagnosis: Dysphagia, oropharyngeal phase (R13.12) Plan: Continue with current plan of care                      Royce Macadamia 09/28/2019, 12:40 PM   Breck Coons Lonell Face.Ed Nurse, children's [phone] Office [phone]

## 2019-09-28 NOTE — Progress Notes (Signed)
Pharmacy Antibiotic Note  Ashleen Demma is a 64 y.o. female admitted on 09/21/2019 with pneumonia.  Pharmacy has been consulted for Vancomycin/Merrem dosing. Has been on Unasyn. Still with some low grade fever. Increased opacities on CXR 2/24.   Plan: Vancomycin 1250 mg IV q24h >>Estimated AUC: 503 Merrem 1g IV q8h Trend WBC, temp, renal function  F/U infectious work-up Drug levels as indicated   Height: 4\' 10"  (147.3 cm) Weight: 180 lb 5.4 oz (81.8 kg) IBW/kg (Calculated) : 40.9  Temp (24hrs), Avg:99.5 F (37.5 C), Min:98.2 F (36.8 C), Max:100.7 F (38.2 C)  Recent Labs  Lab 09/22/19 0406 09/22/19 0406 09/22/19 1638 09/23/19 0240 09/24/19 0525 09/24/19 1116 09/26/19 0326 09/27/19 0317 09/28/19 0300  WBC 11.0*  --  11.4*  --   --  10.0 5.7  --  6.7  CREATININE 1.04*   < >  --  1.09* 0.68  --  0.70 0.84 0.77   < > = values in this interval not displayed.    Estimated Creatinine Clearance: 65.1 mL/min (by C-G formula based on SCr of 0.77 mg/dL).    Allergies  Allergen Reactions  . Cephalexin Other (See Comments)    Seizures   . Namenda [Memantine Hcl] Other (See Comments)    Made memory issues worse, sleepiness, increased confusion    09/30/19, PharmD, BCPS Clinical Pharmacist Phone: [phone]

## 2019-09-28 NOTE — Progress Notes (Signed)
Notified Internal Medicine Teaching Service this morning approximately 0800 of ABG result, to clarify they were aware.  At this time, sister is at bedside feeding the patient.  Patient is awake and appears alert, family states this is the most alert she has been for a few days.  Patient is off BiPap and oxygenation is 87-90 % saturation, Oxygen via Nasal Canula at 3 L, increased to 5 L while eating.

## 2019-09-28 NOTE — Progress Notes (Signed)
Pt had an automatic BP of 86/40 with a MAP of 54. A manual BP was taken, and it was 100/48. Pt had a rectal temperature of 100.7. Dr. Lyn Hollingshead was notified, and a 1,000 mL bolus was ordered.

## 2019-09-28 NOTE — Progress Notes (Signed)
Leadership rounding on patient and family this morning. Patient's sister is requesting pulmonary consult, iv fluids and change in patient's antibiotics. Nursing shared that her request are all physician driven and that we could send this communication to the attending team on her behalf. Teaching Service paged.

## 2019-09-28 NOTE — Consult Note (Signed)
NAME:  Kaitlyn Powell, MRN:  [id number], DOB:  13-Nov-1955, LOS: 6 ADMISSION DATE:  09/21/2019, CONSULTATION DATE:  2/25 REFERRING MD:  Sandre Kitty, CHIEF COMPLAINT:  hypoxia   Brief History   Admitted with aspiration pneumonia, now requiring bipap frequently for desaturations and varying [place] O2 requirements.  History of present illness   History provided by the patient's sister [name] at bedside.  Mrs. Mccallister is a 64 year old woman with a history of Down syndrome and recent diagnosis of Parkinson's disease, OSA on home BiPAP who was admitted with bilateral lower lobe aspiration pneumonia on she has been treated with ampicillin-sulbactam since 2/19 but has ongoing high oxygen requirements.  She often desaturates when she is taken off BiPAP for the past few days.  Her sister relates that she has been out of bed only one time to the chair.  It has been difficult to get her even sitting at the edge of the bed.  She is very concerned about her sisters ability to regain her functional status with her progressive decline at home from her Parkinson's disease.  She has been getting chest percussion therapy every 4 hours, but struggles with incentive spirometry and flutter valve due to ability to coordinate these maneuvers and mouth strength.  Has not been able to expectorate sputum.  She has had ongoing fevers despite antibiotics.  She has been evaluated by speech therapy and recommended dysphagia 1 diet with nectar thick liquids.  Past Medical History  Trisomy 62 Parkinson's disease OSA PE, DVT-on Xarelto prior to admission GERD Hypothyroidism Heart block with pacemaker placement  Significant Hospital Events     Consults:  Pulmonology SLP  Procedures:  L IJ CVC 09/21/2019  Significant Diagnostic Tests:  CT chest 09/20/18-personally reviewed-bibasilar consolidations with air bronchograms, unclear if small dependent pleural effusions as well. CXR 2/24, 1 view-bibasilar consolidation with silhouetting of  hemidiaphragms  Micro Data:  2/19 blood cultures-no growth 2/18 respiratory panel-negative  Antimicrobials:  Zosyn 2/18 Vanc 2/18 Ampicillin-sulbactam 2/19>>  Interim history/subjective:    Objective   Blood pressure 114/60, pulse 90, temperature 99.7 F (37.6 C), temperature source Axillary, resp. rate (!) 22, height 4\' 10"  (1.473 m), weight 81.8 kg, SpO2 95 %.    FiO2 (%):  [30 %-40 %] 40 %   Intake/Output Summary (Last 24 hours) at 09/28/2019 1424 Last data filed at 09/28/2019 1200 Gross per 24 hour  Intake 410 ml  Output 2150 ml  Net -1740 ml   Filed Weights   09/26/19 0307 09/27/19 0500 09/28/19 0410  Weight: 83.3 kg 81.5 kg 81.8 kg    Examination: General: Chronically ill-appearing middle-aged woman sleeping comfortably wearing BiPAP HENT: [place]/AT, eyes anicteric Lungs: Decreased basilar breath sounds, improved when pulled forward in bed and stimulated to take deeper breaths. Cardiovascular: Regular rate and rhythm, no murmurs Abdomen: Soft, nontender, nondistended Extremities: No peripheral edema, no clubbing or cyanosis Neuro: Sleeping, arousable with stimulation.  No focal deficits, tracking, but not attempting to speak. Derm: Pallor, no rashes or wounds  Resolved Hospital Problem list     Assessment & Plan:  Acute hypoxic respiratory failure-question unresolved lower lobe pneumonia versus bibasilar atelectasis. Chronic OSA. -Supplemental oxygen as required to maintain SPO2 greater than 88%.  Avoid hyperoxia. -Continue progressive chest therapy every 4 hours while awake -Flutter and incentive spirometry is much as she is able to do.  Per her sister this may be a challenge due to oral muscle weakness. -Out of bed mobility will be critical to resolution of her hypoxia.  I suspect a significant component of shunt from bibasilar atelectasis.  Her saturations improved with sitting or forward in bed while she was on BiPAP. -Sputum culture -NT suction as needed.   This is likely going to be required to obtain a repeat sputum sample. -Consider re-escalating antibiotics to cover more broadly given fevers and ongoing hypoxia. -Continue BiPAP when sleeping -Continue working with SLP.  I agree with her sister that Parkinsons is likely a significant component of her difficulty handling secretions.  This will continue to become an increasing problem as her disease progresses, regardless of whether or not she is eating due to handling of upper respiratory secretions. -Low suspicion for recurrent pulmonary embolism given anticoagulation prior to admission and since admission.   We will continue to follow along.  Best practice:  Per primary Family Communication: Discussed her ongoing care with her Sister [name] at bedside Disposition:   Labs   CBC: Recent Labs  Lab 09/22/19 0406 09/22/19 1638 09/24/19 1116 09/26/19 0326 09/28/19 0300  WBC 11.0* 11.4* 10.0 5.7 6.7  NEUTROABS 10.3*  --   --   --   --   HGB 8.9* 8.8* 9.3* 8.5* 8.2*  HCT 26.8* 25.4* 27.6* 26.2* 25.3*  MCV 105.1* 102.4* 102.6* 103.6* 105.0*  PLT 77* 57* 77* 110* 129*    Basic Metabolic Panel: Recent Labs  Lab 09/22/19 0406 09/22/19 0406 09/23/19 0240 09/24/19 0525 09/26/19 0326 09/27/19 0317 09/28/19 0300  NA 128*   < > 133* 136 141 145 143  K 4.3   < > 3.8 3.7 3.9 3.8 3.5  CL 97*   < > 100 105 105 108 105  CO2 25   < > 25 24 27 28 28   GLUCOSE 102*   < > 95 81 92 110* 99  BUN 17   < > 16 13 11 12 13   CREATININE 1.04*   < > 1.09* 0.68 0.70 0.84 0.77  CALCIUM 7.5*   < > 7.8* 7.6* 7.9* 8.0* 7.9*  MG 1.7  --   --   --   --   --   --    < > = values in this interval not displayed.   GFR: Estimated Creatinine Clearance: 65.1 mL/min (by C-G formula based on SCr of 0.77 mg/dL). Recent Labs  Lab 09/21/19 2012 09/21/19 2212 09/22/19 0406 09/22/19 1638 09/24/19 1116 09/26/19 0326 09/28/19 0300  WBC  --   --    < > 11.4* 10.0 5.7 6.7  LATICACIDVEN 1.7 1.0  --   --   --   --    --    < > = values in this interval not displayed.    Liver Function Tests: Recent Labs  Lab 09/22/19 0406  AST 12*  ALT 6  ALKPHOS 48  BILITOT 0.6  PROT 5.6*  ALBUMIN 2.1*   No results for input(s): LIPASE, AMYLASE in the last 168 hours. No results for input(s): AMMONIA in the last 168 hours.  ABG    Component Value Date/Time   HCO3 32.5 (H) 09/28/2019 0535   TCO2 27 09/21/2019 2029   O2SAT 43.3 09/28/2019 0535     Coagulation Profile: No results for input(s): INR, PROTIME in the last 168 hours.  Cardiac Enzymes: No results for input(s): CKTOTAL, CKMB, CKMBINDEX, TROPONINI in the last 168 hours.  HbA1C: No results found for: HGBA1C  CBG: Recent Labs  Lab 09/22/19 1956 09/22/19 2357 09/23/19 0426 09/27/19 2012  GLUCAP 125* 72 98 92  Review of Systems:   Unable to be provided due to mental status  Past Medical History  She,  has a past medical history of Arthritis, Aspiration pneumonia (HCC), Asthma, Cardiac arrhythmia, Down syndrome, DVT of popliteal vein (HCC) (09/08/2018), GERD (gastroesophageal reflux disease), Heart block, Hypothyroidism, Mental retardation, Pacemaker, Pulmonary emboli (HCC), Seizures (HCC), and Sleep apnea with use of continuous positive airway pressure (CPAP).   Surgical History    Past Surgical History:  Procedure Laterality Date  . PACEMAKER INSERTION  12/09/2015   [employer] Accolade MRI EL PPM implanted in PA for complete heart block and syncope  . PEG TUBE PLACEMENT     removed in 2017  . TIBIA FRACTURE SURGERY Right      Social History   reports that she has never smoked. She has never used smokeless tobacco. She reports that she does not drink alcohol or use drugs.   Family History   Her family history includes Breast cancer in her sister; Diabetes in her father; Heart disease in her father.   Allergies Allergies  Allergen Reactions  . Cephalexin Other (See Comments)    Seizures   . Namenda [Memantine  Hcl] Other (See Comments)    Made memory issues worse, sleepiness, increased confusion     Home Medications  Prior to Admission medications   Medication Sig Start Date End Date Taking? Authorizing Provider  Cannabidiol (EPIDIOLEX) 100 MG/ML SOLN Take 3.80mL twice a day Patient taking differently: Take 3.4 mLs by mouth in the morning and at bedtime. Take 3.27mL twice a day (will graduate up to 3.7) 06/14/19  Yes Van Clines, MD  carbidopa-levodopa (SINEMET IR) 25-100 MG tablet Take 1 tablet by mouth 3 (three) times daily. Take 1.5 tablet three times a day with meals Patient taking differently: Take 1.5 tablets by mouth 3 (three) times daily. Take 1.5 tablet three times a day with meals ( times given)  0800, 1300, 1900 08/31/19  Yes Van Clines, MD  citalopram (CELEXA) 20 MG tablet Take 20 mg by mouth daily.   Yes [provider]  cyanocobalamin (TH VITAMIN B12) 100 MCG tablet Take 100 mcg by mouth every morning.    Yes [provider]  folic acid (FOLVITE) 1 MG tablet Take 1 mg  by mouth  daily   Yes [provider]  levETIRAcetam (KEPPRA XR) 500 MG 24 hr tablet Take 1 tab in AM, 2 tabs in PM Patient taking differently: Take 500-1,000 mg by mouth See admin instructions. Take 1 tab (500mg ) in AM, 2 tabs( 1000mg ) in PM 08/31/19  Yes , MD  levothyroxine (SYNTHROID, LEVOTHROID) 150 MCG tablet Take 150 mcg by mouth daily before breakfast.   Yes [provider]  OXYGEN Inhale 3 L into the lungs as directed. With CPAP    Yes [provider]  phenytoin (DILANTIN) 200 MG ER capsule Take 1 capsule (200 mg total) by mouth 2 (two) times daily. 08/31/19  Yes Van Clines, MD  Sennosides (SENNA) 8.8 MG/5ML SYRP TAKE 10 MLS BY MOUTH DAILY. AS NEEDED Patient taking differently: Take 10 mLs by mouth daily as needed (for constipation).  08/09/19  Yes Nandigam, Van Clines, MD  simvastatin (ZOCOR) 40 MG tablet TAKE 40 MG BY MOUTH DAILY AT  BEDTIME Patient taking differently: Take 40 mg by mouth at bedtime. Take 40 mg  by mouth daily at bedtime 03/28/19  Yes Allred, Eleonore Chiquito, MD  sodium chloride (MURO 128) 5 % ophthalmic ointment Place 1 application  into both eyes at bedtime.   Yes [provider]  XARELTO 20 MG TABS tablet TAKE 1 TABLET (20 MG TOTAL) BY MOUTH DAILY WITH SUPPER. Patient taking differently: Take 20 mg by mouth daily with supper.  07/21/19  Yes AllredFayrene Fearing, MD  Elastic Bandages & Supports (GAIT/TRANSFER BELT) MISC 1 Product by Does not apply route daily. 04/03/19   Van Clines, MD  furosemide (LASIX) 20 MG tablet Take 1 tablet (20 mg total) by mouth as needed for fluid or edema. Patient not taking: Reported on 09/22/2019 12/28/18   Manson Passey, PA  polyethylene glycol powder (MIRALAX) powder Take 17 g by mouth 3 (three) times daily as needed for mild constipation. Patient not taking: Reported on 09/22/2019 11/01/18   Napoleon Form, MD  risperiDONE (RISPERDAL) 1 MG tablet Take 0.5 mg by mouth 2 (two) times daily. 1/2 tab bid    [provider]     Steffanie Dunn, DO 09/28/19 6:38 PM [facility]

## 2019-09-28 NOTE — Progress Notes (Signed)
Patient's sister and brother-in-law at bedside. Brother-in-law is expressing concern about MD orders and care that the patient is receiving. Dr. Lyn Hollingshead paged and he has agreed to speak with the family when time permits this evening as he is currently involved in an admission in the ED. Family states they will stay at the bedside until then.

## 2019-09-28 NOTE — Progress Notes (Addendum)
Subjective:  Patient seen at bedside. Discussed with sister current plan. Sister requested that PT be able to work with patient daily. Informed that we talked with PT yesterday and they stated they are unable to increase the frequency of therapy due to staffing issues. Patient is on PT schedule for today.  Objective:    Vital Signs (last 24 hours): Vitals:   09/27/19 2051 09/28/19 0005 09/28/19 0321 09/28/19 0410  BP:  (!) 127/56  (!) 122/55  Pulse: 76 80 86 79  Resp: 16 20 (!) 22 18  Temp:  99.2 F (37.3 C)  98.9 F (37.2 C)  TempSrc:  Axillary  Axillary  SpO2: 100% 97% 92% 98%  Weight:    81.8 kg  Height:       Physical Exam: General Resting in bed  Cardiac Regular rate and rhythm, no murmurs, rubs, or gallops  Pulmonary Mild wheezing bilaterally, no cough, rales rhonchi  Abdominal Soft, non-tender, without distention       CBC Latest Ref Rng & Units 09/28/2019 09/26/2019 09/24/2019  WBC 4.0 - 10.5 K/uL 6.7 5.7 10.0  Hemoglobin 12.0 - 15.0 g/dL 8.2(L) 8.5(L) 9.3(L)  Hematocrit 36.0 - 46.0 % 25.3(L) 26.2(L) 27.6(L)  Platelets 150 - 400 K/uL 129(L) 110(L) 77(L)   BMP Latest Ref Rng & Units 09/28/2019 09/27/2019 09/26/2019  Glucose 70 - 99 mg/dL 99 798(X) 92  BUN 8 - 23 mg/dL 13 12 11   Creatinine 0.44 - 1.00 mg/dL 2.11 9.41  BUN/Creat Ratio 12 - 28 - - -  Sodium 135 - 145 mmol/L 143 145 141  Potassium 3.5 - 5.1 mmol/L 3.5 3.8 3.9  Chloride 98 - 111 mmol/L 105 108 105  CO2 22 - 32 mmol/L 28 28 27   Calcium 8.9 - 10.3 mg/dL 7.9(L) 8.0(L) 7.9(L)    Assessment/Plan:   Principal Problem:   Aspiration pneumonia (HCC) Active Problems:   Down syndrome   OSA treated with BiPAP   Acquired hypothyroidism   History of pulmonary embolism  Patient is a 64 year old female with past medical history significant for Down syndrome, Parkinson's disease, DVT/PE, heart block with pacemaker who presented on 09/21/2019 after aspirating and was found to have bibasilar consolidations on  CT scan, patient is being treated for aspiration pneumonia.  # Aspiration pneumonia: Patient had midline placed on 2/20 and central line removed.  Patient with daily low-grade fevers, MBS on 2/22 without clinically significant aspiration. UA with negative nitrite/leukocytes, bacteria. Continue low-grade fevers likely 2/2 repeat aspiration events. Repeat CXR with increased bibasilar opacities. Patient had desaturating to 70s overnight when moved off BiPap. Patient is intermittently saturating well on 5 L March ARB.  * Will consult pulmonology, we appreciate their recommendations * Continue unasyn, on day 7 * Monitor fever curve * Blood culture x2, NG at 5 days  # Down syndrome # Parkinson's Disease *Continue carbidopa-levodopa 37.5-150 mg 3 times daily * Citalopram 20 mg daily * Risperidone 0.5 mg twice daily   # Seizure disorder: * Continue IV keppra 1000 mg every evening, 500 mg every morning * Phenytoin two times daily - 200 mg IV twice daily * Cannabidiol 370 mg twice daily  # Hypothyroidism: Continue IV levothyroxine 75 mcg daily  # Hyperlipidemia: Continue simvastatin 40 mg daily  # Obstructive sleep apnea: Continue CPAP  # Hx of PE/DVT: Continue xarelto 20 mg daily. Per pharmacy, there is contraindication given inactivation of xarelto by phenytoin. Patient not good candidate for warfarin. Medication adjustment can be considered as outpatient. Will start on  therapeutic lovenox as inpatient.  # Macrocytic anemia: Hemoglobin of 9.3 today, stable, MCV of 102.6 * Vitamin B12 WNL and MMA level pending  PT/OT: Following Diet: Dysphagia 1 diet with nectar thick liquids DVT Ppx: Therapeutic lovenox Admit Status: Inpatient Dispo: Anticipated discharge pending clinical improvement  Jeanmarie Hubert, MD 09/28/2019, 7:45 AM

## 2019-09-28 NOTE — Progress Notes (Signed)
Paged for patient desatting to 53s when coming off bipap. At bedside she is following commands but tired appearing, consistent with symptoms earlier today. Chest x-ray earlier in the day demonstrates worsening pleural effusion and bilateral opacities thought to be recurrence of aspiration with inflammation as she has been high risk. O2 currently 95% on bipap, bp 125/85, temperature 99.2. On exam distant lung sounds, RRR, 2+ pitting edema to shins.   - CBC, BMP, VBG - lasix 20 mg IV once  - transition meds to IV   Kaitlyn Powell A, DO 09/28/2019, 2:31 AM Pager: [phone]

## 2019-09-28 NOTE — Progress Notes (Signed)
The temperature is an acute change.  MD notified as [place] LPN's note states.  Will place patient on MEWS yellow at this time.

## 2019-09-28 NOTE — Progress Notes (Addendum)
Visited the patient's room at the family's request. We discussed the difficult balance between making sure we support her blood pressure, but also being careful not to fluid overload her in the setting of increased fluid in her body. We also discussed how we think the patient has recurrent aspirations, which is likely the cause of her ongoing fevers and persistent pneumonia. The patent's vital signs are stable. Current BP in the low 100s systolic, which is lower than her baseline (120s-130s per family). We will continue to monitor closely. All questions and concerns were addressed. We will hold off on additional fluids for now.  Kirt Boys, MD Internal Medicine, PGY1 Pager: [phone]  09/28/2019,9:09 PM

## 2019-09-28 NOTE — Progress Notes (Signed)
Paged returned by attending team and concerns shared. All concerns address and family has been updated by nursing leadership.

## 2019-09-28 NOTE — Progress Notes (Signed)
RT NOTES:  1600 CPT held, patient sleeping at this time.

## 2019-09-28 NOTE — Progress Notes (Signed)
Occupational Therapy Treatment Patient Details Name: Kaitlyn Powell MRN: [id number] DOB: Oct 01, 1955 Today's Date: 09/28/2019    History of present illness Pt is a 64 y.o female with PMH of Down Syndrome and Parkinson's disease, admitted 09/21/19 with hypoxia and respiratory distress. Worked up for aspiration PNA. Other PMH includes DVT on Xarelto, heart block (s/p pacemaker 2017), hypothyroidism, seizures, osteoporosis, multiple chronic T-spine fxs, OSA.   OT comments  Focus of session on achieving upright posture and trunk control sitting at EOB and ring sitting in bed. Pt resists when weight shifted toward R. Demonstrating poor sitting balance with LOB L and posterior. Sister agrees pt needs a hoyer lift for home and requests a BSC. Pt on 5L 02 throughout session with RR of 30 and increased WOB with sats >95%.  Follow Up Recommendations  No OT follow up    Equipment Recommendations  3 in 1 bedside commode(hoyer lift)    Recommendations for Other Services      Precautions / Restrictions Precautions Precautions: Fall Precaution Comments: bipap vs 5L 02       Mobility Bed Mobility Overal bed mobility: Needs Assistance Bed Mobility: Supine to Sit;Sit to Supine     Supine to sit: Total assist;+2 for physical assistance Sit to supine: +2 for physical assistance;Total assist   General bed mobility comments: assist for all aspects using bed pad to rotate pt's hips to EOB, +2 total assist to go from supine to ring sitting x 2, +2 total assist to pull up in bed at end of session  Transfers                 General transfer comment: will require lift    Balance Overall balance assessment: Needs assistance Sitting-balance support: Bilateral upper extremity supported Sitting balance-Leahy Scale: Poor Sitting balance - Comments: posterior and L side lean, resistant to positioning pt leaning toward R                                   ADL either performed or  assessed with clinical judgement   ADL                                         General ADL Comments: Sister agreeable to recommendation for hoyer lift and BSC.     Vision       Perception     Praxis      Cognition Arousal/Alertness: Awake/alert Behavior During Therapy: Flat affect Overall Cognitive Status: History of cognitive impairments - at baseline                                 General Comments: awakened for therapy, but became alert quickly, closing eyes at end of session and returned to supine        Exercises     Shoulder Instructions       General Comments      Pertinent Vitals/ Pain       Pain Assessment: Faces Faces Pain Scale: No hurt Pain Intervention(s): Monitored during session;Repositioned  Home Living  Prior Functioning/Environment              Frequency  Min 2X/week        Progress Toward Goals  OT Goals(current goals can now be found in the care plan section)  Progress towards OT goals: Not progressing toward goals - comment(pt with significant weakness)  Acute Rehab OT Goals Patient Stated Goal: per sister, to keep her as mobile as possible  OT Goal Formulation: With family Time For Goal Achievement: 10/07/19 Potential to Achieve Goals: Fair  Plan Discharge plan remains appropriate    Co-evaluation    PT/OT/SLP Co-Evaluation/Treatment: Yes Reason for Co-Treatment: For patient/therapist safety   OT goals addressed during session: Strengthening/ROM      AM-PAC OT "6 Clicks" Daily Activity     Outcome Measure   Help from another person eating meals?: Total Help from another person taking care of personal grooming?: Total Help from another person toileting, which includes using toliet, bedpan, or urinal?: Total Help from another person bathing (including washing, rinsing, drying)?: Total Help from another person to put on and  taking off regular upper body clothing?: Total Help from another person to put on and taking off regular lower body clothing?: Total 6 Click Score: 6    End of Session Equipment Utilized During Treatment: Oxygen(5L)  OT Visit Diagnosis: Unsteadiness on feet (R26.81);Cognitive communication deficit (R41.841)   Activity Tolerance Patient tolerated treatment well   Patient Left in bed;with call bell/phone within reach;with family/visitor present   Nurse Communication Need for lift equipment        Time: 2841-3244 OT Time Calculation (min): 36 min  Charges: OT General Charges $OT Visit: 1 Visit OT Treatments $Therapeutic Activity: 8-22 mins  [name], OTR/L Acute Rehabilitation Services Pager: [phone] Office: [phone]   [name] 09/28/2019, 3:21 PM

## 2019-09-29 ENCOUNTER — Inpatient Hospital Stay (HOSPITAL_COMMUNITY): Payer: Medicare Other

## 2019-09-29 DIAGNOSIS — J9601 Acute respiratory failure with hypoxia: Secondary | ICD-10-CM

## 2019-09-29 LAB — METHYLMALONIC ACID, SERUM: Methylmalonic Acid, Quantitative: 300 nmol/L (ref 0–378)

## 2019-09-29 MED ORDER — VANCOMYCIN HCL 750 MG/150ML IV SOLN
750.0000 mg | INTRAVENOUS | Status: DC
  Filled 2019-09-29: qty 150

## 2019-09-29 MED ORDER — DEXTROSE-NACL 5-0.45 % IV SOLN: INTRAVENOUS | Status: DC

## 2019-09-29 MED ORDER — PIPERACILLIN-TAZOBACTAM 3.375 G IVPB
3.3750 g | Freq: Three times a day (TID) | INTRAVENOUS | Status: DC
  Administered 2019-09-29 – 2019-10-02 (×8): 3.375 g via INTRAVENOUS
  Filled 2019-09-29 (×7): qty 50

## 2019-09-29 NOTE — Progress Notes (Addendum)
Patient had a pretty good day.  She was much more alert than yesterday, moving to music in her bed, laughing and smiling and talking some.  She was interacting with staff more so than the past few days.  Patient was placed up in the chair position for the morning, with a pillow under her Left buttocks rotating with supine, to help alleviate pressure off her buttocks.  Sister [name] at bedside for the majority of the day.  Patient was placed in chair for a few hours this afternoon by PT/OT.  She has remained off of biPap for the day and started at 5 LNC and is now holding her O2 saturation with 3 LNC.  She is resting now comfortably in the bed.

## 2019-09-29 NOTE — Progress Notes (Signed)
Patient ID: Kaitlyn Powell, female   DOB: 1956-02-10, 64 y.o.   MRN: [id number] Request received for percutaneous gastrostomy tube placement in patient.  CT of the abdomen performed today revealed that stomach was decompressed with no safe percutaneous window for gastrostomy placement currently evident due to overlying colon and left lobe of the liver.  Recommend surgical consult.

## 2019-09-29 NOTE — Progress Notes (Signed)
Pharmacy Antibiotic Note  Kaitlyn Powell is a 64 y.o. female admitted on 09/21/2019 with pneumonia.  Pharmacy has been consulted for Vancomycin/Merrem dosing. Still with some low grade fever. Increased opacities on CXR 2/24.   Given the patient's weight and renal function - will adjust Vancomycin dose. The Meropenem dose remains appropriate.   Plan: - Adjust Vancomycin to 750 mg IV every 24 hours - Continue Merrem 1g IV q8h - Will continue to follow renal function, culture results, LOT, and antibiotic de-escalation plans    Height: 4\' 10"  (147.3 cm) Weight: 178 lb 2.1 oz (80.8 kg) IBW/kg (Calculated) : 40.9  Temp (24hrs), Avg:99.2 F (37.3 C), Min:97.7 F (36.5 C), Max:100.7 F (38.2 C)  Recent Labs  Lab 09/22/19 1638 09/23/19 0240 09/24/19 0525 09/24/19 1116 09/26/19 0326 09/27/19 0317 09/28/19 0300  WBC 11.4*  --   --  10.0 5.7  --  6.7  CREATININE  --  1.09* 0.68  --  0.70 0.84 0.77    Estimated Creatinine Clearance: 64.7 mL/min (by C-G formula based on SCr of 0.77 mg/dL).    Allergies  Allergen Reactions  . Cephalexin Other (See Comments)    Seizures   . Namenda [Memantine Hcl] Other (See Comments)    Made memory issues worse, sleepiness, increased confusion    Vanco 2/25>> Merrem 2/25>> Zosyn/vanc  2/18 x1 Unasyn 2/19>> 2/25  2/19 BCx >> ngtd 2/19 MRSA PCR >> neg 2/25 BCx >> 2/25 RCx >>  Thank you for allowing pharmacy to be a part of this patient's care.  3/25, PharmD, BCPS Clinical Pharmacist Clinical phone for 09/29/2019: [phone] 09/29/2019 1:34 PM   **Pharmacist phone directory can now be found on [email or web address] ([email or web address]).  Listed under [facility].

## 2019-09-29 NOTE — Progress Notes (Signed)
NAME:  Kaitlyn Powell, MRN:  [id number], DOB:  [birth year], LOS: 7 ADMISSION DATE:  09/21/2019, CONSULTATION DATE:  2/25 REFERRING MD:  Rebeca Alert, CHIEF COMPLAINT:  hypoxia   Brief History   Admitted with aspiration pneumonia, now requiring bipap frequently for desaturations and varying [place] O2 requirements.  History of present illness   History provided by the patient's sister [name] at bedside.  Kaitlyn Powell is a 64 year old woman with a history of Down syndrome and recent diagnosis of Parkinson's disease, OSA on home BiPAP who was admitted with bilateral lower lobe aspiration pneumonia on she has been treated with ampicillin-sulbactam since 2/19 but has ongoing high oxygen requirements.  She often desaturates when she is taken off BiPAP for the past few days.  Her sister relates that she has been out of bed only one time to the chair.  It has been difficult to get her even sitting at the edge of the bed.  She is very concerned about her sisters ability to regain her functional status with her progressive decline at home from her Parkinson's disease.  She has been getting chest percussion therapy every 4 hours, but struggles with incentive spirometry and flutter valve due to ability to coordinate these maneuvers and mouth strength.  Has not been able to expectorate sputum.  She has had ongoing fevers despite antibiotics.  She has been evaluated by speech therapy and recommended dysphagia 1 diet with nectar thick liquids.  Past Medical History  Trisomy 77 Parkinson's disease OSA PE, DVT-on Xarelto prior to admission GERD Hypothyroidism Heart block with pacemaker placement  Significant Hospital Events     Consults:  Pulmonology SLP  Procedures:  L IJ CVC 09/21/2019  Significant Diagnostic Tests:  CT chest 09/20/18-personally reviewed-bibasilar consolidations with air bronchograms, unclear if small dependent pleural effusions as well. CXR 2/24, 1 view-bibasilar consolidation with silhouetting of  hemidiaphragms  Micro Data:  2/19 blood cultures-no growth 2/18 respiratory panel-negative  Antimicrobials:  Zosyn 2/18 Vanc 2/18 Ampicillin-sulbactam 2/19>>2/25 Vanc 2/25> Meropenem>  Interim history/subjective:  Tolerated BiPAP overnight and currently on 5L [place]. She has been afebrile however daily low-grade temps. Sister at bedside.  Objective   Blood pressure (!) 112/55, pulse 71, temperature 98.7 F (37.1 C), temperature source Axillary, resp. rate 19, height 4\' 10"  (1.473 m), weight 80.8 kg, SpO2 98 %.    FiO2 (%):  [35 %-40 %] 40 %   Intake/Output Summary (Last 24 hours) at 09/29/2019 0925 Last data filed at 09/29/2019 0343 Gross per 24 hour  Intake 1572.85 ml  Output 1700 ml  Net -127.15 ml   Filed Weights   09/27/19 0500 09/28/19 0410 09/29/19 0406  Weight: 81.5 kg 81.8 kg 80.8 kg   Physical Exam: General: Chronically ill-appearing, Down syndrome features, no acute distress HENT: , AT, OP clear, MMM, poor dentition Eyes: EOMI, no scleral icterus Respiratory: Diminished breath sounds bilaterally.  No crackles, wheezing or rales Cardiovascular: RRR, -M/R/G, no JVD GI: BS+, soft, nontender Extremities:-Edema,-tenderness Neuro: Awake, interactive, attempting to speak with me and family, moves extremities x 4 Skin: Intact, no rashes or bruising  Resolved Hospital Problem list     Assessment & Plan:   Acute hypoxemic respiratory failure secondary to severe pneumonia due to recurrent aspiration, atelectasis OSA on CPAP -Wean supplemental oxygen as required to maintain SPO2 greater than 88%.  Avoid hyperoxia. -Continue progressive chest therapy every 4 hours while awake -Flutter and incentive spirometry is much as she is able to do.  Per her sister this may be  a challenge due to oral muscle weakness. -Out of bed mobility will be critical to resolution of her hypoxia.  I suspect a significant component of shunt from bibasilar atelectasis.  Her saturations improved  with sitting or forward in bed while she was on BiPAP. -Sputum culture -NT suction as needed. This is likely going to be required to obtain a repeat sputum sample. -Agree with escalating antibiotics. HCAP coverage should be adequate. -Continue BiPAP when sleeping -Continue working with SLP.  I agree with her sister that Parkinsons is likely a significant component of her difficulty handling secretions.  This will continue to become an increasing problem as her disease progresses, regardless of whether or not she is eating due to handling of upper respiratory secretions. -Low suspicion for recurrent pulmonary embolism given anticoagulation prior to admission and since admission.  Dysphagia --Continue dysphagia diet --Aspiration precautions --Family wished to discuss Dobhoff trial to see if PEG tube would be helpful. I explained Dobhoff is a temporary in-hospital measure to provide medications and nutrition but would not predict success to PEG tube for aspiration risk. Patients in general with aspiration risk can still aspirate with PEG tube placement. I will defer to primary team to continue discussions with family.  Pulmonary will continue to follow peripherally. Please contact the on-call pager over the weekend for any urgent issues or questions. Will plan to see again on 2/28  Care Time: 40 min  Mechele Collin, M.D. [facility] Pulmonary/Critical Care Medicine 09/29/2019 9:53 AM      Best practice:  Per primary Family Communication: Updated sister, [name]. Disposition: Per primary team  Labs   CBC: Recent Labs  Lab 09/22/19 1638 09/24/19 1116 09/26/19 0326 09/28/19 0300  WBC 11.4* 10.0 5.7 6.7  HGB 8.8* 9.3* 8.5* 8.2*  HCT 25.4* 27.6* 26.2* 25.3*  MCV 102.4* 102.6* 103.6* 105.0*  PLT 57* 77* 110* 129*    Basic Metabolic Panel: Recent Labs  Lab 09/23/19 0240 09/24/19 0525 09/26/19 0326 09/27/19 0317 09/28/19 0300  NA 133* 136 141 145 143  K 3.8 3.7 3.9 3.8 3.5  CL 100  105 105 108 105  CO2 25 24 27 28 28   GLUCOSE 95 81 92 110* 99  BUN 16 13 11 12 13   CREATININE 1.09* 0.68 0.70 0.84 0.77  CALCIUM 7.8* 7.6* 7.9* 8.0* 7.9*   GFR: Estimated Creatinine Clearance: 64.7 mL/min (by C-G formula based on SCr of 0.77 mg/dL). Recent Labs  Lab 09/22/19 1638 09/24/19 1116 09/26/19 0326 09/28/19 0300  WBC 11.4* 10.0 5.7 6.7    Liver Function Tests: No results for input(s): AST, ALT, ALKPHOS, BILITOT, PROT, ALBUMIN in the last 168 hours. No results for input(s): LIPASE, AMYLASE in the last 168 hours. No results for input(s): AMMONIA in the last 168 hours.  ABG    Component Value Date/Time   HCO3 32.5 (H) 09/28/2019 0535   TCO2 27 09/21/2019 2029   O2SAT 43.3 09/28/2019 0535     Coagulation Profile: No results for input(s): INR, PROTIME in the last 168 hours.  Cardiac Enzymes: No results for input(s): CKTOTAL, CKMB, CKMBINDEX, TROPONINI in the last 168 hours.  HbA1C: No results found for: HGBA1C  CBG: Recent Labs  Lab 09/22/19 1956 09/22/19 2357 09/23/19 0426 09/27/19 2012  GLUCAP 125* 72 98 92

## 2019-09-29 NOTE — Progress Notes (Signed)
Pharmacy Antibiotic Note  Kaitlyn Powell is a 64 y.o. female admitted on 09/21/2019 with aspiration PNA.  Pharmacy has been consulted for zosyn dosing.  Plan: Zosyn EIN 3.375gm IV q8h Meropenem d/c'd per MD  Height: 4\' 10"  (147.3 cm) Weight: 178 lb 2.1 oz (80.8 kg) IBW/kg (Calculated) : 40.9  Temp (24hrs), Avg:99.2 F (37.3 C), Min:97.7 F (36.5 C), Max:100.7 F (38.2 C)  Recent Labs  Lab 09/23/19 0240 09/24/19 0525 09/24/19 1116 09/26/19 0326 09/27/19 0317 09/28/19 0300  WBC  --   --  10.0 5.7  --  6.7  CREATININE 1.09* 0.68  --  0.70 0.84 0.77    Estimated Creatinine Clearance: 64.7 mL/min (by C-G formula based on SCr of 0.77 mg/dL).    Allergies  Allergen Reactions  . Cephalexin Other (See Comments)    Seizures   . Namenda [Memantine Hcl] Other (See Comments)    Made memory issues worse, sleepiness, increased confusion    Antimicrobials this admission: Vanco 2/25>> Zosyn 2/26>> Merrem 2/25>>2/26 Zosyn/vanc  2/18 x1 Unasyn 2/19>> 2/25   Microbiology results: 2/19 BCx >> ngtd 2/19 MRSA PCR >> neg 2/25 BCx >> 2/25 RCx >>  Glorious Flicker A. 3/25, PharmD, BCPS, FNKF Clinical Pharmacist [place] Please utilize Amion for appropriate phone number to reach the unit pharmacist [facility])   09/29/2019 5:27 PM

## 2019-09-29 NOTE — Progress Notes (Signed)
Dr. Shirlee Latch called to confirm postponement of the Peg Tube placement till [facility] 10/02/19, 10/03/19. Patient caregiver [name] was informed and agreed with the decision. Mrs [name] inquired about nutrients for the patient until peg tube placement, Dr Shirlee Latch advised to give D5 via IV. Due to patient been NPO, Dr. Shirlee Latch advised to hold off carbidopa-levodopa until peg placement. All the doctors was via phone.

## 2019-09-29 NOTE — Progress Notes (Signed)
Occupational Therapy Treatment Patient Details Name: Rye Dorado MRN: [id number] DOB: 21-Dec-1955 Today's Date: 09/29/2019    History of present illness Pt is a 64 y.o female with PMH of Down Syndrome and Parkinson's disease, admitted 09/21/19 with hypoxia and respiratory distress. Worked up for aspiration PNA. Other PMH includes DVT on Xarelto, heart block (s/p pacemaker 2017), hypothyroidism, seizures, osteoporosis, multiple chronic T-spine fxs, OSA.   OT comments  Pt alert and talking today. Lifted to chair using maximove. Pt with improved trunk control and able to long sit in bed with min assist. Pt "dancing" and listening to music at end of session in chair. VSS throughout.   Follow Up Recommendations  No OT follow up    Equipment Recommendations  3 in 1 bedside commode;Other (comment)(hoyer lift)    Recommendations for Other Services      Precautions / Restrictions Precautions Precautions: Fall Precaution Comments: 5L 02 via [place]       Mobility Bed Mobility Overal bed mobility: Needs Assistance Bed Mobility: Supine to Sit     Supine to sit: Min assist     General bed mobility comments: pt upright in bed, lowered HOB with pt able to maintain long sitting with min assist to support trunk  Transfers Overall transfer level: Needs assistance               General transfer comment: placed lift pad under hips in sitting at bed level     Balance Overall balance assessment: Needs assistance   Sitting balance-Leahy Scale: Poor Sitting balance - Comments: min posterior lean                                   ADL either performed or assessed with clinical judgement   ADL                                         General ADL Comments: Asked unit secretary to order geomat.     Vision       Perception     Praxis      Cognition Arousal/Alertness: Awake/alert Behavior During Therapy: WFL for tasks assessed/performed Overall  Cognitive Status: History of cognitive impairments - at baseline                                 General Comments: pt alert, smilling and talking today, able to state her name        Exercises     Shoulder Instructions       General Comments      Pertinent Vitals/ Pain       Pain Assessment: Faces Faces Pain Scale: No hurt  Home Living                                          Prior Functioning/Environment              Frequency  Min 2X/week        Progress Toward Goals  OT Goals(current goals can now be found in the care plan section)  Progress towards OT goals: Progressing toward goals  Acute Rehab OT Goals Patient Stated Goal: per sister,  to keep her as mobile as possible  OT Goal Formulation: With family Time For Goal Achievement: 10/07/19 Potential to Achieve Goals: [place] Discharge plan remains appropriate    Co-evaluation    PT/OT/SLP Co-Evaluation/Treatment: Yes Reason for Co-Treatment: For patient/therapist safety   OT goals addressed during session: Strengthening/ROM      AM-PAC OT "6 Clicks" Daily Activity     Outcome Measure   Help from another person eating meals?: Total Help from another person taking care of personal grooming?: Total Help from another person toileting, which includes using toliet, bedpan, or urinal?: Total Help from another person bathing (including washing, rinsing, drying)?: Total Help from another person to put on and taking off regular upper body clothing?: Total Help from another person to put on and taking off regular lower body clothing?: Total 6 Click Score: 6    End of Session Equipment Utilized During Treatment: Oxygen(5L)  OT Visit Diagnosis: Unsteadiness on feet (R26.81);Cognitive communication deficit (R41.841)   Activity Tolerance Patient tolerated treatment well(VSS)   Patient Left in chair;with call bell/phone within reach;with nursing/sitter in room   Nurse  Communication Need for lift equipment        Time: 5631-4970 OT Time Calculation (min): 28 min  Charges: OT General Charges $OT Visit: 1 Visit OT Treatments $Therapeutic Activity: 8-22 mins  Nestor Lewandowsky, OTR/L Acute Rehabilitation Services Pager: [phone] Office: [phone]   Malka So 09/29/2019, 3:05 PM

## 2019-09-29 NOTE — Progress Notes (Signed)
Subjective:  Patient's sister at bedside. Articulates desire for patient to have PEG tube placed. She stated that she is aware that the patient may still aspirate on the PEG tube. She was worried that she may have made the patient worse when she fed the patient prior day 2/25. She feels that the patient is worsening-has noticed peristalsis has slowed down and feels that she had a good run with these chronic illness. She states that she feels that she will not regret a few weeks later with peg tube placement even if patient has continued decline, because patient will be able to receive nutrition and have chance of regaining strength. Benefits of palliative care consult discussed with sister, and she is amenable to consult.  Objective:    Vital Signs (last 24 hours): Vitals:   09/28/19 2258 09/29/19 0328 09/29/19 0406 09/29/19 0417  BP: (!) 102/50 (!) 112/55    Pulse: 68 66  71  Resp: 18 20  19   Temp: 98.2 F (36.8 C) 98.7 F (37.1 C)    TempSrc: Oral Axillary    SpO2: 97% 99%  95%  Weight:   80.8 kg   Height:        Physical Exam: General Resting in bed, no acute sitres  Cardiac Regular rate and rhythm, no murmurs, rubs, or gallops  Pulmonary Clear to auscultation bilaterally without wheezes, rhonchi, or rales  Abdominal Soft, non-tender, without distention       BMP Latest Ref Rng & Units 09/28/2019 09/27/2019 09/26/2019  Glucose 70 - 99 mg/dL 99 09/28/2019) 92  BUN 8 - 23 mg/dL 13 12 11   Creatinine 0.44 - 1.00 mg/dL 188(C 1.66  BUN/Creat Ratio 12 - 28 - - -  Sodium 135 - 145 mmol/L 143 145 141  Potassium 3.5 - 5.1 mmol/L 3.5 3.8 3.9  Chloride 98 - 111 mmol/L 105 108 105  CO2 22 - 32 mmol/L 28 28 27   Calcium 8.9 - 10.3 mg/dL 7.9(L) 8.0(L) 7.9(L)   CBC Latest Ref Rng & Units 09/28/2019 09/26/2019 09/24/2019  WBC 4.0 - 10.5 K/uL 6.7 5.7 10.0  Hemoglobin 12.0 - 15.0 g/dL 8.2(L) 8.5(L) 9.3(L)  Hematocrit 36.0 - 46.0 % 25.3(L) 26.2(L) 27.6(L)  Platelets 150 - 400 K/uL 129(L) 110(L)  77(L)     Assessment/Plan:   Principal Problem:   Aspiration pneumonia (HCC) Active Problems:   Down syndrome   OSA treated with BiPAP   Acquired hypothyroidism   History of pulmonary embolism  Patient is a 64 year old female with past medical history significant for Down syndrome, Parkinson's disease, DVT/PE, heart block with pacemaker who presented on 09/21/2019 after aspirating was found to have bibasilar consolidations on CT scan, patient is being treated for aspiration pneumonia.  # Aspiration pneumonia: MBS on 2/22 was without clinically significant aspiration.  Patient is without other infectious source with urinalysis, blood cultures without evidence for infection, no signs of skin infection.  With continued low-grade fevers, pulmonology was consulted and they recommended consideration of broadening antibiotics. With hypotensive episode overnight, antibiotics were broadened from Unasyn (received 7 days) to vancomycin + meropenem. Patient has history of seizures while on Keflex, so cefepime was not utilized *Continue vancomycin + meropenem *SpO2 goal > 88% per pulmonology *Pulmonology following, we appreciate their continued recommendations *Follow fever curve *No labs were collected this a.m. due to family's wishes *Palliative care consulted for goals of care discussion  # Down syndrome # Parkinson's disease *Continue carbidopa-levodopa 37.5-150 mg 3 times daily *Citalopram 20 mg daily *Risperidone  0.5 mg twice daily  # Seizure disorder *Continue IV Keppra 1000 mg every evening, 500 mg every morning *Phenytoin per pharmacy consult - 130 mg Q8HRs * Cannabidiol 370 mg twice daily  # Hypothyroidism: Continue IV levothyroxine 75 mg daily  # Hyperlipidemia: Continue simvastatin 40 mg daily  # Obstructive sleep apnea: Continue CPAP  # Hx of PE/DVT: Xarelto discontinued yesterday as it is contraindicated to take this with phenytoin (phenytoin inhibit effective Xarelto).   Patient is not a good candidate for warfarin.  Patient initiated on therapeutic Lovenox.  # Macrocytic anemia: No labs today. Patient with macrocytic anemia, vitamin B12 was within normal limits, MMA level pending *Followup MMA level  PT/OT: Recommend HH PT, 3 N 1, no OT folllowup recommended Diet: Dysphagia 1 with crushed meds DVT Ppx: Therapeutic lovenox Admit Status: Inpatient Dispo: Anticipated discharge pending clinical improvement  Jeanmarie Hubert, MD 09/29/2019, 6:44 AM

## 2019-09-29 NOTE — Progress Notes (Signed)
[id number] primary caregiver [name] declined giving medication to patient via NPO due to her concern of aspiration at 0820. I called Dr. Shirlee Latch on [phone] at 0830 and he returned call at 0835. He asked me to put Medications via NPO on hold and focus on medication via IV at the moment and he will personally speak to the family member during morning rounds. Brennan Bailey RN my mentor was informed about all these protocols.

## 2019-09-29 NOTE — Progress Notes (Signed)
Physical Therapy Treatment Patient Details Name: Kaitlyn Powell MRN: [id number] DOB: Aug 06, 1955 Today's Date: 09/29/2019    History of Present Illness Pt is a 64 y.o female with PMH of Down Syndrome and Parkinson's disease, admitted 09/21/19 with hypoxia and respiratory distress. Worked up for aspiration PNA. Other PMH includes DVT on Xarelto, heart block (s/p pacemaker 2017), hypothyroidism, seizures, osteoporosis, multiple chronic T-spine fxs, OSA.   PT Comments    Pt alert and conversant today, even dancing when listening to music at end of session. Demonstrates improved trunk control, able to tolerate prolonged long sitting in bed with minA, progressing to modA with fatigue. Transferred to recliner with use of maximove lift, pt tolerated great. SpO2 92-96% on 7L O2, HR 88. Sister present and supportive.   Follow Up Recommendations  Home health PT;Supervision/Assistance - 24 hour     Equipment Recommendations  3in1 (PT);Hospital bed;Hoyer lift   Recommendations for Other Services       Precautions / Restrictions Precautions Precautions: Fall Precaution Comments: 7L O2 [place] Restrictions Weight Bearing Restrictions: No    Mobility  Bed Mobility Overal bed mobility: Needs Assistance Bed Mobility: Supine to Sit     Supine to sit: Min assist     General bed mobility comments: pt upright in bed, lowered HOB with pt able to maintain long sitting with min assist to support trunk  Transfers Overall transfer level: Needs assistance               General transfer comment: placed lift pad under hips in long-sitting at bed level; pt tolerate transfer from bed to recliner via maximove well, smiling throughout; VSS  Ambulation/Gait                 Stairs             Wheelchair Mobility    Modified Rankin (Stroke Patients Only)       Balance Overall balance assessment: Needs assistance Sitting-balance support: Bilateral upper extremity supported Sitting  balance-Leahy Scale: Poor Sitting balance - Comments: min posterior lean                                    Cognition Arousal/Alertness: Awake/alert Behavior During Therapy: WFL for tasks assessed/performed Overall Cognitive Status: History of cognitive impairments - at baseline                                 General Comments: pt alert, smilling and talking today, able to state her name      Exercises      General Comments General comments (skin integrity, edema, etc.): SpO2 92-96% on 7L O2 [place], HR 88      Pertinent Vitals/Pain Pain Assessment: Faces Faces Pain Scale: No hurt Pain Intervention(s): Monitored during session    Home Living                      Prior Function            PT Goals (current goals can now be found in the care plan section) Acute Rehab PT Goals Patient Stated Goal: per sister, to keep her as mobile as possible  Progress towards PT goals: Progressing toward goals    Frequency    Min 2X/week      PT Plan Current plan remains appropriate    Co-evaluation PT/OT/SLP  Co-Evaluation/Treatment: Yes Reason for Co-Treatment: For patient/therapist safety PT goals addressed during session: Mobility/safety with mobility OT goals addressed during session: Strengthening/ROM      AM-PAC PT "6 Clicks" Mobility   Outcome Measure  Help needed turning from your back to your side while in a flat bed without using bedrails?: Total Help needed moving from lying on your back to sitting on the side of a flat bed without using bedrails?: Total Help needed moving to and from a bed to a chair (including a wheelchair)?: Total Help needed standing up from a chair using your arms (e.g., wheelchair or bedside chair)?: Total Help needed to walk in hospital room?: Total Help needed climbing 3-5 steps with a railing? : Total 6 Click Score: 6    End of Session Equipment Utilized During Treatment: Oxygen Activity Tolerance:  Patient tolerated treatment well Patient left: in chair;with call bell/phone within reach;with family/visitor present Nurse Communication: Mobility status;Need for lift equipment PT Visit Diagnosis: Muscle weakness (generalized) (M62.81);Other symptoms and signs involving the nervous system (R29.898)     Time: 1583-0940 PT Time Calculation (min) (ACUTE ONLY): 28 min  Charges:  $Therapeutic Activity: 8-22 mins                    [name], PT, DPT Acute Rehabilitation Services  Pager [phone] Office [phone]  [name] 09/29/2019, 4:53 PM

## 2019-09-29 NOTE — Progress Notes (Signed)
Contacted Diane in Pharmacy to see if there was an IV form of [place] and there is not.  Internal Medicine Teaching Service paged to make them aware, patient was still NPO and of the above information.

## 2019-09-30 DIAGNOSIS — Z79890 Hormone replacement therapy (postmenopausal): Secondary | ICD-10-CM

## 2019-09-30 DIAGNOSIS — E785 Hyperlipidemia, unspecified: Secondary | ICD-10-CM

## 2019-09-30 DIAGNOSIS — K5900 Constipation, unspecified: Secondary | ICD-10-CM

## 2019-09-30 DIAGNOSIS — Z79899 Other long term (current) drug therapy: Secondary | ICD-10-CM

## 2019-09-30 DIAGNOSIS — G20 Parkinson's disease: Secondary | ICD-10-CM

## 2019-09-30 DIAGNOSIS — G40909 Epilepsy, unspecified, not intractable, without status epilepticus: Secondary | ICD-10-CM

## 2019-09-30 DIAGNOSIS — D539 Nutritional anemia, unspecified: Secondary | ICD-10-CM

## 2019-09-30 DIAGNOSIS — E039 Hypothyroidism, unspecified: Secondary | ICD-10-CM

## 2019-09-30 DIAGNOSIS — I459 Conduction disorder, unspecified: Secondary | ICD-10-CM

## 2019-09-30 DIAGNOSIS — Z86718 Personal history of other venous thrombosis and embolism: Secondary | ICD-10-CM

## 2019-09-30 DIAGNOSIS — Z7901 Long term (current) use of anticoagulants: Secondary | ICD-10-CM

## 2019-09-30 DIAGNOSIS — Z950 Presence of cardiac pacemaker: Secondary | ICD-10-CM

## 2019-09-30 LAB — BASIC METABOLIC PANEL WITH GFR
Anion gap: 8 (ref 5–15)
BUN: 12 mg/dL (ref 8–23)
CO2: 30 mmol/L (ref 22–32)
Calcium: 8 mg/dL — ABNORMAL LOW (ref 8.9–10.3)
Chloride: 106 mmol/L (ref 98–111)
Creatinine, Ser: 0.93 mg/dL (ref 0.44–1.00)
GFR calc Af Amer: >60 mL/min
GFR calc non Af Amer: >60 mL/min
Glucose, Bld: 118 mg/dL — ABNORMAL HIGH (ref 70–99)
Potassium: 2.8 mmol/L — ABNORMAL LOW (ref 3.5–5.1)
Sodium: 144 mmol/L (ref 135–145)

## 2019-09-30 LAB — CBC
HCT: 26.2 % — ABNORMAL LOW (ref 36.0–46.0)
Hemoglobin: 8.5 g/dL — ABNORMAL LOW (ref 12.0–15.0)
MCH: 34.1 pg — ABNORMAL HIGH (ref 26.0–34.0)
MCHC: 32.4 g/dL (ref 30.0–36.0)
MCV: 105.2 fL — ABNORMAL HIGH (ref 80.0–100.0)
Platelets: 148 10*3/uL — ABNORMAL LOW (ref 150–400)
RBC: 2.49 MIL/uL — ABNORMAL LOW (ref 3.87–5.11)
RDW: 12.9 % (ref 11.5–15.5)
WBC: 5.5 10*3/uL (ref 4.0–10.5)
nRBC: 0 % (ref 0.0–0.2)

## 2019-09-30 MED ORDER — ACETAMINOPHEN 650 MG RE SUPP: 650.0000 mg | Freq: Four times a day (QID) | RECTAL | Status: DC | PRN

## 2019-09-30 MED ORDER — POTASSIUM CHLORIDE 10 MEQ/100ML IV SOLN
10.0000 meq | INTRAVENOUS | Status: AC
  Administered 2019-09-30 (×6): 10 meq via INTRAVENOUS
  Filled 2019-09-30 (×5): qty 100

## 2019-09-30 NOTE — Progress Notes (Addendum)
Subjective:  Patient seen at bedside.  Discussed plan of care with family, questions answered.  Objective:    Vital Signs (last 24 hours): Vitals:   09/29/19 1758 09/29/19 2042 09/30/19 0000 09/30/19 0622  BP:  (!) 112/47 (!) 112/58 (!) 112/58  Pulse:  75 77   Resp:  16 20   Temp: 99.3 F (37.4 C)   98.6 F (37 C)  TempSrc: Axillary   Axillary  SpO2:  97% 96%   Weight:      Height:        Physical Exam: General Resting in bed, no acute distress  Cardiac Regular rate and rhythm, no murmurs, rubs, or gallops  Pulmonary Breathing comfortably on 3L [place], no cough, distress  Abdominal Soft, non-tender, without distention   CBC Latest Ref Rng & Units 09/30/2019 09/28/2019 09/26/2019  WBC 4.0 - 10.5 K/uL 5.5 6.7 5.7  Hemoglobin 12.0 - 15.0 g/dL 8.5(L) 8.2(L) 8.5(L)  Hematocrit 36.0 - 46.0 % 26.2(L) 25.3(L) 26.2(L)  Platelets 150 - 400 K/uL 148(L) 129(L) 110(L)   BMP Latest Ref Rng & Units 09/30/2019 09/28/2019 09/27/2019  Glucose 70 - 99 mg/dL 118(H) 99 110(H)  BUN 8 - 23 mg/dL 12 13 12   Creatinine 0.44 - 1.00 mg/dL 0.93 0.77 0.84  BUN/Creat Ratio 12 - 28 - - -  Sodium 135 - 145 mmol/L 144 143 145  Potassium 3.5 - 5.1 mmol/L 2.8(L) 3.5 3.8  Chloride 98 - 111 mmol/L 106 105 108  CO2 22 - 32 mmol/L 30 28 28   Calcium 8.9 - 10.3 mg/dL 8.0(L) 7.9(L) 8.0(L)    Assessment/Plan:   Principal Problem:   Aspiration pneumonia (HCC) Active Problems:   Down syndrome   OSA treated with BiPAP   Acquired hypothyroidism   History of pulmonary embolism  Patient is a 64 year old female with past medical history significant for Down syndrome, Parkinson's disease, DVT/PE, heart block with pacemaker present on 09/21/2019 after aspiration and was found to have bibasilar consolidations on CT scan, patient is being treated for aspiration pneumonia.  # Aspiration pneumonia: MBS on 2/22 was without clinically significant aspiration. Patient is without other infectious source with urinalysis, blood  cultures without evidence for infection, no signs of skin infection. With continued low-grade fevers, pulmonology was consulted and recommended broadening antibiotics.  Antibiotics have been broadened to Zosyn.  We will continue patient n.p.o., plan for G-tube placement per surgery, will hold all oral medications except for carbidopa-levodopa given risk of withdrawal - to be taken if patient is awake. *Continue Zosyn *General surgery consulted for G-tube placement *SpO2 goal greater than 88% per pulmonology *Pulmonology following peripherally *Follow fever curve, white count *Palliative care consulted, we appreciate their involvement  # Down syndrome # Parkinson's disease *Continue carbidopa levodopa 37.5-150 mg 3 times daily - take with puree *Citalopram 20 mg daily - will restart when patient has G-tube *Risperidone 0.5 mg twice daily - will restart when patient has G-tube *Cannabidiol 370 mg twice daily - will restart when patient has G-tube  # Seizure disorder *Continue IV Keppra 1000 mg every evening, 500 mg every morning *Phenytoin per pharmacy consult - 130 mg IV every 8 hours *Cannabidiol 370 mg PO twice daily - will restart when patient has G-tube  # Hypothyroidism: Continue IV levothyroxine 75 mg daily  # Hx of DVT: Patient with history of repeat DVT, was advised for lifelong anticoagulation.  Xarelto is contraindicated with phenytoin usage.  Therapeutic Lovenox is an option, but could be difficult for family to administer  and could be expensive.  Warfarin may also be difficult for family to manage.   *Will continue with therapeutic Lovenox for now  # Macrocytic anemia: Hemoglobin stable, Vitamin B12 within normal limits, MMA level WNL  # Obstructive sleep apnea: Continue BiPap at night  # Nutrition: Vitamin B12 100 mcg daily - will restart when patient has G-tube # Hyperlipidemia: Simvastatin 40 mg daily - will restart when patient has G-tube # Constipation: Sena 1 tablet  PRN once daily for constipation - restart when patient has G-tube  PT/OT: Recommend HH PT (ordered), 3 N 1 (ordered), hospital bed, and hoyer lift. No OT followup recommended.  Diet: NPO except for carbidopa-levodopa with puree, no other oral medications IVF: D5-1/2NS at 100 cc/hr DVT Ppx: Therapeutic lovenox Admit Status: Inpatient Dispo: Anticipated discharge pending clinical improvement  Katherine Roan, MD 09/30/2019, 6:54 AM

## 2019-09-30 NOTE — Consult Note (Signed)
[name] Surgery Consult/Admission Note  Kaitlyn Powell [birth year]  [id number].    Requesting MD: Katherine Roan Chief Complaint: Respiratory distress Reason for Consult: dysphagia/gastrostomy tube placement  HPI:  Patient is a 64 year old female with Down syndrome, Parkinson's disease, DVT/PE, history of CHB with permanent transvenous pacemaker, obstructive sleep apnea, hypothyroidism, who presented to ED after aspiration was found to have bibasilar consolidation on the CT scan.  She was admitted for aspiration pneumonia.  During her hospitalization she was seen by pulmonary she has continued issues with dysphagia family is asking for PEG placement.  She was evaluated by IR and CT obtained yesterday shows no safe window for gastrostomy placement we are asked to see.   Family History  Problem Relation Age of Onset  . Diabetes Father   . Heart disease Father   . Breast cancer Sister     Past Medical History:  Diagnosis Date  . Arthritis   . Aspiration pneumonia (HCC)   . Asthma   . Cardiac arrhythmia    have pacemeaker  . Down syndrome   . DVT of popliteal vein (HCC) 09/08/2018   chronic  . GERD (gastroesophageal reflux disease)   . Heart block   . Hypothyroidism   . Mental retardation   . Pacemaker   . Pulmonary emboli (HCC)   . Seizures (HCC)   . Sleep apnea with use of continuous positive airway pressure (CPAP)     Past Surgical History:  Procedure Laterality Date  . PACEMAKER INSERTION  12/09/2015   [employer] Accolade MRI EL PPM implanted in PA for complete heart block and syncope  . PEG TUBE PLACEMENT     removed in 2017  . TIBIA FRACTURE SURGERY Right     Social History:  reports that she has never smoked. She has never used smokeless tobacco. She reports that she does not drink alcohol or use drugs.  Allergies:  Allergies  Allergen Reactions  . Cephalexin Other (See Comments)    Seizures   . Namenda [Memantine Hcl] Other (See Comments)     Made memory issues worse, sleepiness, increased confusion    Medications Prior to Admission  Medication Sig Dispense Refill  . Cannabidiol (EPIDIOLEX) 100 MG/ML SOLN Take 3.37mL twice a day (Patient taking differently: Take 3.4 mLs by mouth in the morning and at bedtime. Take 3.60mL twice a day (will graduate up to 3.7)) 230 mL 5  . carbidopa-levodopa (SINEMET IR) 25-100 MG tablet Take 1 tablet by mouth 3 (three) times daily. Take 1.5 tablet three times a day with meals (Patient taking differently: Take 1.5 tablets by mouth 3 (three) times daily. Take 1.5 tablet three times a day with meals ( times given)  0800, 1300, 1900) 405 tablet 3  . citalopram (CELEXA) 20 MG tablet Take 20 mg by mouth daily.    . cyanocobalamin (TH VITAMIN B12) 100 MCG tablet Take 100 mcg by mouth every morning.     . folic acid (FOLVITE) 1 MG tablet Take 1 mg  by mouth  daily    . levETIRAcetam (KEPPRA XR) 500 MG 24 hr tablet Take 1 tab in AM, 2 tabs in PM (Patient taking differently: Take 500-1,000 mg by mouth See admin instructions. Take 1 tab (500mg ) in AM, 2 tabs( 1000mg ) in PM) 270 tablet 3  . levothyroxine (SYNTHROID, LEVOTHROID) 150 MCG tablet Take 150 mcg by mouth daily before breakfast.    . OXYGEN Inhale 3 L into the lungs as directed. With CPAP     .  phenytoin (DILANTIN) 200 MG ER capsule Take 1 capsule (200 mg total) by mouth 2 (two) times daily. 180 capsule 3  . Sennosides (SENNA) 8.8 MG/5ML SYRP TAKE 10 MLS BY MOUTH DAILY. AS NEEDED (Patient taking differently: Take 10 mLs by mouth daily as needed (for constipation). ) 237 mL 1  . simvastatin (ZOCOR) 40 MG tablet TAKE 40 MG BY MOUTH DAILY AT BEDTIME (Patient taking differently: Take 40 mg by mouth at bedtime. Take 40 mg  by mouth daily at bedtime) 90 tablet 2  . sodium chloride (MURO 128) 5 % ophthalmic ointment Place 1 application into both eyes at bedtime.    Kaitlyn Powell 20 MG TABS tablet TAKE 1 TABLET (20 MG TOTAL) BY MOUTH DAILY WITH SUPPER. (Patient taking  differently: Take 20 mg by mouth daily with supper. ) 90 tablet 1  . Elastic Bandages & Supports (GAIT/TRANSFER BELT) MISC 1 Product by Does not apply route daily. 1 each 3  . furosemide (LASIX) 20 MG tablet Take 1 tablet (20 mg total) by mouth as needed for fluid or edema. (Patient not taking: Reported on 09/22/2019) 30 tablet 11  . polyethylene glycol powder (MIRALAX) powder Take 17 g by mouth 3 (three) times daily as needed for mild constipation. (Patient not taking: Reported on 09/22/2019) 255 g 11  . risperiDONE (RISPERDAL) 1 MG tablet Take 0.5 mg by mouth 2 (two) times daily. 1/2 tab bid      Blood pressure (!) 90/49, pulse 77, temperature 98.1 F (36.7 C), temperature source Axillary, resp. rate 20, height 4\' 10"  (1.473 m), weight 80.8 kg, SpO2 95 %. Physical Exam:  General Resting in bed, no acute distress  Cardiac Regular rate and rhythm, no murmurs, rubs, or gallops  Pulmonary Breathing comfortably on 3L [place], no cough, distress  Abdominal Soft, non-tender, without distention     Results for orders placed or performed during the hospital encounter of 09/21/19 (from the past 48 hour(s))  Culture, blood (routine x 2)     Status: None (Preliminary result)   Collection Time: 09/28/19 10:05 PM   Specimen: BLOOD  Result Value Ref Range   Specimen Description BLOOD RIGHT UPPER ARM    Special Requests      BOTTLES DRAWN AEROBIC ONLY Blood Culture results may not be optimal due to an inadequate volume of blood received in culture bottles   Culture      NO GROWTH 2 DAYS Performed at [facility], [address] [place], [place]    Report Status PENDING   Culture, blood (routine x 2)     Status: None (Preliminary result)   Collection Time: 09/28/19 10:13 PM   Specimen: BLOOD  Result Value Ref Range   Specimen Description BLOOD RIGHT LOWER ARM    Special Requests      BOTTLES DRAWN AEROBIC ONLY Blood Culture results may not be optimal due to an inadequate volume of  blood received in culture bottles   Culture      NO GROWTH 2 DAYS Performed at [facility], [address], [place], [place]    Report Status PENDING   CBC     Status: Abnormal   Collection Time: 09/30/19  5:13 AM  Result Value Ref Range   WBC 5.5 4.0 - 10.5 K/uL   RBC 2.49 (L) 3.87 - 5.11 MIL/uL   Hemoglobin 8.5 (L) 12.0 - 15.0 g/dL   HCT 10/02/19 (L) 32.6 - 71.2 %   MCV 105.2 (H)  80.0 - 100.0 fL   MCH 34.1 (H) 26.0 - 34.0 pg   MCHC 32.4 30.0 - 36.0 g/dL   RDW 40.1 02.7 - 25.3 %   Platelets 148 (L) 150 - 400 K/uL   nRBC 0.0 0.0 - 0.2 %    Comment: Performed at [facility], [address], [place], [place] [address]  Basic metabolic panel     Status: Abnormal   Collection Time: 09/30/19  5:13 AM  Result Value Ref Range   Sodium 144 135 - 145 mmol/L   Potassium 2.8 (L) 3.5 - 5.1 mmol/L   Chloride 106 98 - 111 mmol/L   CO2 30 22 - 32 mmol/L   Glucose, Bld 118 (H) 70 - 99 mg/dL    Comment: Glucose reference range applies only to samples taken after fasting for at least 8 hours.   BUN 12 8 - 23 mg/dL   Creatinine, Ser 3.47 0.44 - 1.00 mg/dL   Calcium 8.0 (L) 8.9 - 10.3 mg/dL   GFR calc non Af Amer >60 >60 mL/min   GFR calc Af Amer >60 >60 mL/min   Anion gap 8 5 - 15    Comment: Performed at [facility], [address], [place], [place] [address]   CT ABDOMEN WO CONTRAST  Result Date: 09/29/2019 CLINICAL DATA:  Preop planning, gastrostomy placement EXAM: CT ABDOMEN WITHOUT CONTRAST TECHNIQUE: Multidetector CT imaging of the abdomen was performed following the standard protocol without IV contrast. COMPARISON:  CT chest 09/21/2019, CT abdomen 02/07/2018 FINDINGS: Lower chest: Small bilateral pleural effusions. Consolidation/atelectasis with air bronchograms posteriorly in bilateral visualized lung bases. Right atrial and ventricular pacing leads partially visualized. Hepatobiliary: No focal liver abnormality is seen. No gallstones, gallbladder wall  thickening, or biliary dilatation. Pancreas: Unremarkable. No pancreatic ductal dilatation or surrounding inflammatory changes. Spleen: Normal in size without focal abnormality. Adrenals/Urinary Tract: Mild bilateral adrenal hypertrophy. Kidneys normal in morphology without hydronephrosis or urolithiasis. Stomach/Bowel: Stomach is decompressed, with no safe percutaneous window for gastrostomy placement currently evident due to overlying colon and left lobe of the liver. Visualized small bowel decompressed. There is residual contrast material in the transverse colon. Vascular/Lymphatic: Minimal Aortic Atherosclerosis (ICD10-170.0). No adenopathy localized. Other: No free air. No abdominal ascites. Musculoskeletal: Progressive compression deformities of L1, L2, and L3 vertebral bodies with some posterior retropulsion into the spinal canal at all 3 levels. Degenerative disc disease L4-S1. IMPRESSION: 1. Stomach is decompressed, with no safe percutaneous window for gastrostomy placement currently evident. 2. Progressive compression deformities of L1, L2, and L3 vertebral bodies. 3. Small bilateral pleural effusions with bibasilar consolidation/atelectasis. 4. Aortoiliac Atherosclerosis (ICD10-170.0). Electronically Signed   By: Corlis Leak M.D.   On: 09/29/2019 16:31      Assessment/Plan Down syndrome Parkinson's disease DVT/PE History of CHB with permanent transvenous pacemaker, Ostructive sleep apnea Hypothyroidism Aspiration pneumonia  Will await results of Palliative Care discussion Would recommend asking GI to evaluate this patient for PEG placement.  She previously had a PEG placed by GI at [facility], so the stomach should be adherent to the anterior abdominal wall.  With active pneumonia, she would not be a good candidate for general anesthesia, which would be required if she was to have a surgical gastrostomy tube.  Call us back if all other measures fail and Palliative Care discussions indicate the  need for feeding access.  Wilmon Arms. Corliss Skains, MD, [facility] Surgery  General/ Trauma Surgery   09/30/2019 12:00 PM  [facility]arnst[facility] Surgery 09/30/2019, 11:26 AM Please see Amion for pager number during day hours 7:00am-4:30pm

## 2019-10-01 ENCOUNTER — Inpatient Hospital Stay (HOSPITAL_COMMUNITY): Payer: Medicare Other

## 2019-10-01 LAB — CBC
HCT: 24.9 % — ABNORMAL LOW (ref 36.0–46.0)
Hemoglobin: 7.9 g/dL — ABNORMAL LOW (ref 12.0–15.0)
MCH: 33.9 pg (ref 26.0–34.0)
MCHC: 31.7 g/dL (ref 30.0–36.0)
MCV: 106.9 fL — ABNORMAL HIGH (ref 80.0–100.0)
Platelets: 132 10*3/uL — ABNORMAL LOW (ref 150–400)
RBC: 2.33 MIL/uL — ABNORMAL LOW (ref 3.87–5.11)
RDW: 13.1 % (ref 11.5–15.5)
WBC: 4.9 10*3/uL (ref 4.0–10.5)
nRBC: 0 % (ref 0.0–0.2)

## 2019-10-01 LAB — BASIC METABOLIC PANEL
Anion gap: 7 (ref 5–15)
BUN: 11 mg/dL (ref 8–23)
CO2: 28 mmol/L (ref 22–32)
Calcium: 7.9 mg/dL — ABNORMAL LOW (ref 8.9–10.3)
Chloride: 108 mmol/L (ref 98–111)
Creatinine, Ser: 0.79 mg/dL (ref 0.44–1.00)
GFR calc Af Amer: >60 mL/min (ref >60)
GFR calc non Af Amer: >60 mL/min (ref >60)
Glucose, Bld: 105 mg/dL — ABNORMAL HIGH (ref 70–99)
Potassium: 3.5 mmol/L (ref 3.5–5.1)
Sodium: 143 mmol/L (ref 135–145)

## 2019-10-01 LAB — MAGNESIUM: Magnesium: 1.5 mg/dL — ABNORMAL LOW (ref 1.7–2.4)

## 2019-10-01 LAB — GLUCOSE, CAPILLARY
Glucose-Capillary: 74 mg/dL (ref 70–99)
Glucose-Capillary: 67 mg/dL — ABNORMAL LOW (ref 70–99)
Glucose-Capillary: 108 mg/dL — ABNORMAL HIGH (ref 70–99)

## 2019-10-01 MED ORDER — DEXTROSE 50 % IV SOLN
12.5000 g | INTRAVENOUS | Status: AC
  Administered 2019-10-01: 12.5 g via INTRAVENOUS
  Filled 2019-10-01: qty 50

## 2019-10-01 MED ORDER — FUROSEMIDE 10 MG/ML IJ SOLN
40.0000 mg | Freq: Once | INTRAMUSCULAR | Status: AC
  Administered 2019-10-01: 40 mg via INTRAVENOUS
  Filled 2019-10-01: qty 4

## 2019-10-01 MED ORDER — MAGNESIUM SULFATE IN D5W 1-5 GM/100ML-% IV SOLN
1.0000 g | Freq: Once | INTRAVENOUS | Status: AC
  Administered 2019-10-01: 1 g via INTRAVENOUS
  Filled 2019-10-01: qty 100

## 2019-10-01 MED ORDER — POTASSIUM CHLORIDE 10 MEQ/100ML IV SOLN
10.0000 meq | INTRAVENOUS | Status: AC
  Administered 2019-10-01 (×2): 10 meq via INTRAVENOUS
  Filled 2019-10-01 (×2): qty 100

## 2019-10-01 NOTE — Progress Notes (Addendum)
MEDICATION RELATED CONSULT NOTE  Pharmacy Consult for phenytoin, enoxaparin Indication: hx of seizures   Allergies  Allergen Reactions  . Cephalexin Other (See Comments)    Seizures   . Namenda [Memantine Hcl] Other (See Comments)    Made memory issues worse, sleepiness, increased confusion    Patient Measurements: Height: 4\' 10"  (147.3 cm) Weight: 188 lb 11.4 oz (85.6 kg) IBW/kg (Calculated) : 40.9  Vital Signs: Temp: 98.5 F (36.9 C) (02/28 0746) Temp Source: Axillary (02/28 0746) BP: 107/56 (02/28 0746) Pulse Rate: 64 (02/28 0820)   Intake/Output from previous day: 02/27 0701 - 02/28 0700 In: 3063.8 [I.V.:1772.2; IV Piggyback:1291.7] Out: 650 [Urine:650]   Intake/Output from this shift: No intake/output data recorded.  Labs: Recent Labs    09/30/19 0513 10/01/19 0312  WBC 5.5 4.9  HGB 8.5* 7.9*  HCT 26.2* 24.9*  PLT 148* 132*  CREATININE 0.93 0.79  MG  --  1.5*   Estimated Creatinine Clearance: 66.8 mL/min (by C-G formula based on SCr of 0.79 mg/dL).  Assessment: 64 yo f with hx of chronic RLE DVT Feb 2020 and seizures. Per IM team and patient's family, patient was recommended to be on lifelong anticoagulation given repeat doppler showed persistent DVT. Patient has apparently been stable on phenytoin with no recent seizures so would hesitate to stop phenytoin. Phenytoin interacts with all DOACs (decreased DOAC serum levels) so home rivaroxaban was switched to enoxaparin here. Per IM team, unclear what the long term anticoagulation plan will be as warfarin will likely be difficult for the family (frequent INR checks) and enoxaparin will require prior authorization for long term insurance coverage.  Currently she is unable to take PO due to aspiration, so her phenytoin was converted to IV. Renal function and CBC stable. No bleeds reported. Pending discussion with multiple teams regarding PEG placement. Would still require IR phenytoin (was on ER at home) if PEG  placed so will order levels at steady state.    Plan:  Hold rivaroxaban Continue enoxaparin 1 mg/kg q12h   Phenytoin 130 mg IV q8h F/u 3/1 Phenytoin trough and albumin (goal corrected total PHT 10-20, free PHT 1- 2) F/u long term AC plan   Mar 2020, PharmD, BCPS, BCCP Clinical Pharmacist  Please check AMION for all [facility] Pharmacy phone numbers After 10:00 PM, call Main Pharmacy [phone]

## 2019-10-01 NOTE — Progress Notes (Signed)
Subjective: HD#9   Overnight: No acute events reported  Today, Kaitlyn Powell was examined at bedside with her sister and brother-in-law.  They were updated regarding her management plan and the input from general surgery regarding the PEG tube placement.  I made them aware that I would consult gastroenterology today for further assistance.  Objective:  Vital signs in last 24 hours: Vitals:   10/01/19 0023 10/01/19 0045 10/01/19 0336 10/01/19 0347  BP: (!) 90/50  (!) 102/51 (!) 102/51  Pulse: 66 66  63  Resp: (!) 21 17 19 16   Temp: 98.2 F (36.8 C)  98.3 F (36.8 C) 98.1 F (36.7 C)  TempSrc: Oral  Axillary Axillary  SpO2: 99% 100% 100% 90%  Weight:    85.6 kg  Height:       Const: Lying in bed HEENT: Nasal cannula in place, dry oral mucosa Resp: Unable to auscultate posteriorly however lung sounds clear anteriorly CV: RRR, no murmurs, gallop, rub   Assessment/Plan:  Principal Problem:   Aspiration pneumonia (HCC) Active Problems:   Down syndrome   OSA treated with BiPAP   Acquired hypothyroidism   History of pulmonary embolism  Patient is a 64 year old female with past medical history significant for Down syndrome, Parkinson's disease, DVT/PE, heart block with pacemaker present on 09/21/2019 after aspiration and was found to have bibasilar consolidations on CT scan, patient is being treated for aspiration pneumonia.  # Aspiration pneumonia: MBS on 2/22 was without clinically significant aspiration. Patient is without other infectious source with urinalysis, blood cultures without evidence for infection, no signs of skin infection. With continued low-grade fevers, pulmonology was consulted and recommended broadening antibiotics.  Antibiotics have been broadened to Zosyn.   She was evaluated by general surgery for PEG tube placement however she was deemed not the best candidate for general anesthesia given her ongoing pulmonary insult.  They recommended for patient to be  evaluated by gastroenterology.  Will hold all oral medications except for carbidopa-levodopa given risk of withdrawal - to be taken if patient is awake. *Continue Zosyn *SpO2 goal greater than 88% per pulmonology *Pulmonology following peripherally *Follow fever curve, white count *Palliative care consulted, we appreciate their involvement *Follow-up GI recommendations  # Down syndrome # Parkinson's disease *Continue carbidopa levodopa 37.5-150 mg 3 times daily - take with puree *Citalopram 20 mg daily - will restart when patient has G-tube *Risperidone 0.5 mg twice daily - will restart when patient has G-tube *Cannabidiol 370 mg twice daily - will restart when patient has G-tube  # Seizure disorder *Continue IV Keppra 1000 mg every evening, 500 mg every morning *Phenytoin per pharmacy consult - 130 mg IV every 8 hours *Cannabidiol 370 mg PO twice daily - will restart when patient has G-tube  # Hypothyroidism: Continue IV levothyroxine 75 mg daily  # Hx of DVT: Patient with history of repeat DVT, was advised for lifelong anticoagulation.  Xarelto is contraindicated with phenytoin usage.  Therapeutic Lovenox is an option, but could be difficult for family to administer and could be expensive.  Warfarin may also be difficult for family to manage.   *Will continue with therapeutic Lovenox for now  # Macrocytic anemia: Hemoglobin stable, Vitamin B12 within normal limits, MMA level WNL  # Obstructive sleep apnea: Continue BiPap at night  # Nutrition: Vitamin B12 100 mcg daily - will restart when patient has G-tube # Hyperlipidemia: Simvastatin 40 mg daily - will restart when patient has G-tube # Constipation: Sena 1 tablet PRN once  daily for constipation - restart when patient has G-tube  PT/OT: Recommend HH PT (ordered), 3 N 1 (ordered), hospital bed, and hoyer lift. No OT followup recommended.  Diet: NPO except for carbidopa-levodopa with puree, no other oral medications IVF:  D5-1/2NS at 100 cc/hr DVT Ppx: Therapeutic lovenox Admit Status: Inpatient Dispo: Anticipated discharge pending clinical improvement   Jean Rosenthal, MD 10/01/2019, 6:09 AM Pager: [phone] Internal Medicine Teaching Service

## 2019-10-01 NOTE — Progress Notes (Signed)
NAME:  Kaitlyn Powell, MRN:  [id number], DOB:  23-Jun-1956, LOS: 9 ADMISSION DATE:  09/21/2019, CONSULTATION DATE:  2/25 REFERRING MD:  Kaitlyn Powell, CHIEF COMPLAINT:  hypoxia   Brief History   Admitted with aspiration pneumonia, now requiring bipap frequently for desaturations and varying [place] O2 requirements.  History of present illness   History provided by the patient's sister Kaitlyn Dandy at bedside.  Kaitlyn Powell is a 64 year old woman with a history of Down syndrome and recent diagnosis of Parkinson's disease, OSA on home BiPAP who was admitted with bilateral lower lobe aspiration pneumonia on she has been treated with ampicillin-sulbactam since 2/19 but has ongoing high oxygen requirements.  She often desaturates when she is taken off BiPAP for the past few days.  Her sister relates that she has been out of bed only one time to the chair.  It has been difficult to get her even sitting at the edge of the bed.  She is very concerned about her sisters ability to regain her functional status with her progressive decline at home from her Parkinson's disease.  She has been getting chest percussion therapy every 4 hours, but struggles with incentive spirometry and flutter valve due to ability to coordinate these maneuvers and mouth strength.  Has not been able to expectorate sputum.  She has had ongoing fevers despite antibiotics.  She has been evaluated by speech therapy and recommended dysphagia 1 diet with nectar thick liquids.  Past Medical History  Trisomy 85 Parkinson's disease OSA PE, DVT-on Xarelto prior to admission GERD Hypothyroidism Heart block with pacemaker placement  Significant Hospital Events     Consults:  Pulmonology SLP  Procedures:  L IJ CVC 09/21/2019  Significant Diagnostic Tests:  CT chest 09/20/18-personally reviewed-bibasilar consolidations with air bronchograms, unclear if small dependent pleural effusions as well. CXR 2/24, 1 view-bibasilar consolidation with silhouetting of  hemidiaphragms  Micro Data:  2/19 blood cultures-no growth 2/18 respiratory panel-negative  Antimicrobials:  Zosyn 2/18, 2/26> Vanc 2/18 Ampicillin-sulbactam 2/19>>2/25 Vanc 2/25 Meropenem 2/25>2/26  Interim history/subjective:  Tolerating BiPAP overnight. Now on 3L O2 via [place] with SpO2 100%. Sister and brother-in-law at bedside. IR and Surgery have declined PEG tube placement due to lack of window and concern for active pneumonia, respectively. Family is frustrated as IVF is causing patient to become edematous.  Objective   Blood pressure (!) 93/49, pulse 65, temperature 98 F (36.7 C), temperature source Axillary, resp. rate 17, height 4\' 10"  (1.473 m), weight 85.6 kg, SpO2 100 %.    FiO2 (%):  [40 %] 40 %   Intake/Output Summary (Last 24 hours) at 10/01/2019 1500 Last data filed at 10/01/2019 0600 Gross per 24 hour  Intake 3063.84 ml  Output 650 ml  Net 2413.84 ml   Filed Weights   09/28/19 0410 09/29/19 0406 10/01/19 0347  Weight: 81.8 kg 80.8 kg 85.6 kg   Physical Exam: General: Chronically ill appearing, Down syndrome features, no acute distress, resting comfortably in chair HENT: , AT, OP clear, MMM Eyes: EOMI, no scleral icterus Respiratory: Diminished breath sounds bilaterally. No crackles, wheezing or rales Cardiovascular: RRR, -M/R/G, no JVD GI: BS+, soft, nontender Extremities: 1+ pitting edema in lower extremities Neuro: Awake, interactive, moves extremities x 4 Skin: Intact, no rashes or bruising  CXR 2/28 - Worsening bibasilar pulmonary edema  Resolved Hospital Problem list     Assessment & Plan:   Acute hypoxemic respiratory failure secondary to severe pneumonia due to recurrent aspiration, atelectasis OSA on CPAP Overall, her respiratory status  is improving with nasal cannula weaned to 2L via [place].   --Wean supplemental O2 for goal SpO2 >90% --Pulmonary hygiene: CPT q4h when awake, flutter and IS as able with her orofacial weakness tum  culture --Complete 5 days of HCAP therapy (ends 3/1) --Lasix 40 mg once. Replete K --BiPAP nightly and as needed with naps -Speech consulted  Dysphagia --Continue dysphagia diet --Aspiration precautions --Discussed with family that she is at increased risk for aspiration with or without the PEG tube. I will discuss with primary team plan  Rodman Pickle, M.D. [facility] Pulmonary/Critical Care Medicine 10/01/2019 3:19 PM   Best practice:  Per primary Family Communication: Updated sister, Kaitlyn Powell. Disposition: Per primary team  Labs   CBC: Recent Labs  Lab 09/26/19 0326 09/28/19 0300 09/30/19 0513 10/01/19 0312  WBC 5.7 6.7 5.5 4.9  HGB 8.5* 8.2* 8.5* 7.9*  HCT 26.2* 25.3* 26.2* 24.9*  MCV 103.6* 105.0* 105.2* 106.9*  PLT 110* 129* 148* 132*    Basic Metabolic Panel: Recent Labs  Lab 09/26/19 0326 09/27/19 0317 09/28/19 0300 09/30/19 0513 10/01/19 0312  NA 141 145 143 144 143  K 3.9 3.8 3.5 2.8* 3.5  CL 105 108 105 106 108  CO2 27 28 28 30 28   GLUCOSE 92 110* 99 118* 105*  BUN 11 12 13 12 11   CREATININE 0.70 0.84 0.77 0.93 0.79  CALCIUM 7.9* 8.0* 7.9* 8.0* 7.9*  MG  --   --   --   --  1.5*   GFR: Estimated Creatinine Clearance: 66.8 mL/min (by C-G formula based on SCr of 0.79 mg/dL). Recent Labs  Lab 09/26/19 0326 09/28/19 0300 09/30/19 0513 10/01/19 0312  WBC 5.7 6.7 5.5 4.9    Liver Function Tests: No results for input(s): AST, ALT, ALKPHOS, BILITOT, PROT, ALBUMIN in the last 168 hours. No results for input(s): LIPASE, AMYLASE in the last 168 hours. No results for input(s): AMMONIA in the last 168 hours.  ABG    Component Value Date/Time   HCO3 32.5 (H) 09/28/2019 0535   TCO2 27 09/21/2019 2029   O2SAT 43.3 09/28/2019 0535     Coagulation Profile: No results for input(s): INR, PROTIME in the last 168 hours.  Cardiac Enzymes: No results for input(s): CKTOTAL, CKMB, CKMBINDEX, TROPONINI in the last 168 hours.  HbA1C: No results found for:  HGBA1C  CBG: Recent Labs  Lab 09/27/19 2012  GLUCAP 92

## 2019-10-02 DIAGNOSIS — Z01811 Encounter for preprocedural respiratory examination: Secondary | ICD-10-CM

## 2019-10-02 DIAGNOSIS — Z515 Encounter for palliative care: Secondary | ICD-10-CM

## 2019-10-02 DIAGNOSIS — Q909 Down syndrome, unspecified: Secondary | ICD-10-CM

## 2019-10-02 DIAGNOSIS — Z9989 Dependence on other enabling machines and devices: Secondary | ICD-10-CM

## 2019-10-02 DIAGNOSIS — R1310 Dysphagia, unspecified: Secondary | ICD-10-CM

## 2019-10-02 DIAGNOSIS — Z7189 Other specified counseling: Secondary | ICD-10-CM

## 2019-10-02 LAB — BASIC METABOLIC PANEL
Anion gap: 11 (ref 5–15)
BUN: 8 mg/dL (ref 8–23)
CO2: 30 mmol/L (ref 22–32)
Calcium: 8.1 mg/dL — ABNORMAL LOW (ref 8.9–10.3)
Chloride: 101 mmol/L (ref 98–111)
Creatinine, Ser: 0.79 mg/dL (ref 0.44–1.00)
GFR calc Af Amer: >60 mL/min (ref >60)
GFR calc non Af Amer: >60 mL/min (ref >60)
Glucose, Bld: 124 mg/dL — ABNORMAL HIGH (ref 70–99)
Potassium: 3.1 mmol/L — ABNORMAL LOW (ref 3.5–5.1)
Sodium: 142 mmol/L (ref 135–145)

## 2019-10-02 LAB — MAGNESIUM: Magnesium: 1.4 mg/dL — ABNORMAL LOW (ref 1.7–2.4)

## 2019-10-02 LAB — GLUCOSE, CAPILLARY
Glucose-Capillary: 113 mg/dL — ABNORMAL HIGH (ref 70–99)
Glucose-Capillary: 67 mg/dL — ABNORMAL LOW (ref 70–99)
Glucose-Capillary: 72 mg/dL (ref 70–99)
Glucose-Capillary: 74 mg/dL (ref 70–99)
Glucose-Capillary: 75 mg/dL (ref 70–99)
Glucose-Capillary: 77 mg/dL (ref 70–99)
Glucose-Capillary: 84 mg/dL (ref 70–99)
Glucose-Capillary: 90 mg/dL (ref 70–99)

## 2019-10-02 LAB — PHOSPHORUS: Phosphorus: 3.1 mg/dL (ref 2.5–4.6)

## 2019-10-02 LAB — PHENYTOIN LEVEL, TOTAL: Phenytoin Lvl: 11.4 ug/mL (ref 10.0–20.0)

## 2019-10-02 LAB — ALBUMIN: Albumin: 1.6 g/dL — ABNORMAL LOW (ref 3.5–5.0)

## 2019-10-02 MED ORDER — DEXTROSE 10 % IV SOLN: INTRAVENOUS | Status: DC

## 2019-10-02 MED ORDER — SODIUM CHLORIDE 0.9 % IV SOLN
110.0000 mg | Freq: Three times a day (TID) | INTRAVENOUS | Status: DC
  Administered 2019-10-02 – 2019-10-08 (×16): 110 mg via INTRAVENOUS
  Filled 2019-10-02 (×21): qty 2.2

## 2019-10-02 MED ORDER — DEXTROSE 50 % IV SOLN
12.5000 g | INTRAVENOUS | Status: DC | PRN
  Administered 2019-10-02 – 2019-10-03 (×2): 12.5 g via INTRAVENOUS
  Filled 2019-10-02 (×2): qty 50

## 2019-10-02 MED ORDER — SODIUM CHLORIDE (HYPERTONIC) 5 % OP SOLN
1.0000 [drp] | Freq: Every day | OPHTHALMIC | Status: DC
  Administered 2019-10-02 – 2019-10-11 (×10): 1 [drp] via OPHTHALMIC
  Filled 2019-10-02: qty 15

## 2019-10-02 MED ORDER — MAGNESIUM SULFATE 2 GM/50ML IV SOLN
2.0000 g | Freq: Once | INTRAVENOUS | Status: AC
  Administered 2019-10-02: 2 g via INTRAVENOUS
  Filled 2019-10-02: qty 50

## 2019-10-02 MED ORDER — VANCOMYCIN HCL IN DEXTROSE 1-5 GM/200ML-% IV SOLN
1000.0000 mg | INTRAVENOUS | Status: AC
  Filled 2019-10-02: qty 200

## 2019-10-02 MED ORDER — POTASSIUM CHLORIDE 10 MEQ/100ML IV SOLN
10.0000 meq | INTRAVENOUS | Status: AC
  Administered 2019-10-02 (×6): 10 meq via INTRAVENOUS
  Filled 2019-10-02 (×6): qty 100

## 2019-10-02 NOTE — Consult Note (Signed)
Consultation Note Date: 10/02/2019   Patient Name: Kaitlyn Powell  DOB: [birth year]  MRN: [id number]  Age / Sex: 64 y.o., female  PCP: Bernerd Limbo, MD Referring Physician: Oda Kilts, MD  Reason for Consultation: Establishing goals of care  HPI/Patient Profile: 64 y.o. female  with past medical history of Down's syndrome, recent Parkinson's diagnosis, cerebral atrophy, heart block s/p pacemaker, hypothryoid, GERD, sleep apnea requiring bipap, chronic T-spine fractures admitted on 09/21/2019 with respiratory failure requiring bipap secondary to aspiration pnuemonia. She continued to have fevers and respiratory desaturation despite aggressive antibiotic therapy and passing swallow evaluation. It was determined she is likely silently aspirating and plan was made for PEG tube. Tenuous situation as patient's respiratory status precludes surgical placement of PEG. IR initially declined to place PEG, however, agreed after further evaluation- at which point family told IR they preferred to have surgery place. Surgery again stated that IR is best option for placement. Palliative consulted for additional assistance with goals of care.   Clinical Assessment and Goals of Care: Evaluated patient and met at bedside with patient's sisterStanton Powell.  Patient is mostly non-verbal- does say "I love you" to her sister and smiles intermittently.  Chart review shows patient has declined significantly to a point where she is mostly bedbound, sleeps most of the day- and now with difficulty swallowing.  Kaitlyn Powell was very overwhelmed and expressed frustration with what she perceives to be delays in patient's care of having PEG placed and temporary feeding tube placed. Attempted to offer support and aid in resolution of frustrations. Discussed with nursing staff- patient needs coretrack ordered- however, coretrack team is not on campus today-  will be present tomorrow. Also discussed with Kaitlyn Powell the risk vs benefit of having IR vs surgery place PEG. I discussed with her that the risk of surgery placement (vent dependence, worsening of nuero status, poor healing) is greater than intervention through IR. Kaitlyn Powell then stated she just wanted to discuss with her spouse.  Kaitlyn Powell did not wish to proceed with goals of care discussion with Palliative team member at this time (offered discussion of comfort feeding with aspiration precautions vs artificial nutrition- but Kaitlyn Powell is not interested in this path)- however, agreed to followup meeting tomorrow after she has discussed patient's care with primary team.  I later attended meeting with Kaitlyn Powell and patient's attending provider- [name], and Kaitlyn Powell's husband who is a nurse.  Patient's spouse was also present.  [name] discussed need to carefully consider the risks and overall benefits to patient from placing PEG. The likelihood that the PEG will improve patient's overall prognosis or quality of life is minimal.  At close of discussion- family agreed to following plan: 1. Coretrack to be placed by coretrack team tomorrow. 2. PEG by IR (believe the earliest this can be done is on Wednesday) 3. F/U meeting with Palliative Medicine provider on Wednesday at 19 when [facility] and her husband can both be present for continued GOC and support.    Primary Decision Maker HCPOA- patient's sister- Kaitlyn Powell  SUMMARY OF RECOMMENDATIONS -Continued full scope care- coretrack tomorrow, PEG likely on Wednesday -Palliative followup meeting on Wednesday at 1030am with Kaitlyn Powell and her husband for further overall GOC and anticipatory care making discussion    Code Status/Advance Care Planning:  Full code  Palliative Prophylaxis:   Aspiration, Frequent Pain Assessment and Oral Care  Additional Recommendations (Limitations, Scope, Preferences):  Full Scope Treatment   Prognosis:    Unable to determine  Discharge Planning: To  Be Determined  Primary Diagnoses: Present on Admission: . Aspiration pneumonia ([place]) . OSA treated with BiPAP . Acquired hypothyroidism . History of pulmonary embolism   I have reviewed the medical record, interviewed the patient and family, and examined the patient. The following aspects are pertinent.  Past Medical History:  Diagnosis Date  . Arthritis   . Aspiration pneumonia ([place])   . Asthma   . Cardiac arrhythmia    have pacemeaker  . Down syndrome   . DVT of popliteal vein ([place]) 09/08/2018   chronic  . GERD (gastroesophageal reflux disease)   . Heart block   . Hypothyroidism   . Mental retardation   . Pacemaker   . Pulmonary emboli ([place])   . Seizures ([place])   . Sleep apnea with use of continuous positive airway pressure (CPAP)    Social History   Socioeconomic History  . Marital status: Single    Spouse name: Not on file  . Number of children: Not on file  . Years of education: Not on file  . Highest education level: Not on file  Occupational History  . Occupation: disabled  Tobacco Use  . Smoking status: Never Smoker  . Smokeless tobacco: Never Used  Substance and Sexual Activity  . Alcohol use: No  . Drug use: No  . Sexual activity: Never  Other Topics Concern  . Not on file  Social History Narrative   Pt is left handed   Lives in 2 story home with her sister, brother-in-law and BIL's brother (pt stays on bottom floor)   Social Determinants of Health   Financial Resource Strain:   . Difficulty of Paying Living Expenses: Not on file  Food Insecurity:   . Worried About Charity fundraiser in the Last Year: Not on file  . Ran Out of Food in the Last Year: Not on file  Transportation Needs:   . Lack of Transportation (Medical): Not on file  . Lack of Transportation (Non-Medical): Not on file  Physical Activity:   . Days of Exercise per Week: Not on file  . Minutes of Exercise per Session: Not on file  Stress:   . Feeling of Stress : Not on file    Social Connections:   . Frequency of Communication with Friends and Family: Not on file  . Frequency of Social Gatherings with Friends and Family: Not on file  . Attends Religious Services: Not on file  . Active Member of Clubs or Organizations: Not on file  . Attends Archivist Meetings: Not on file  . Marital Status: Not on file   Family History  Problem Relation Age of Onset  . Diabetes Father   . Heart disease Father   . Breast cancer Sister    Scheduled Meds: . budesonide  0.25 mg Nebulization BID  . carbidopa-levodopa  1.5 tablet Oral TID WC  . Chlorhexidine Gluconate Cloth  6 each Topical Daily  . enoxaparin (LOVENOX) injection  1 mg/kg Subcutaneous Q12H  . ipratropium-albuterol  3 mL Nebulization  QID  . levothyroxine  75 mcg Intravenous Daily  . sodium chloride   Both Eyes QHS  . sodium chloride flush  10-40 mL Intracatheter Q12H   Continuous Infusions: . dextrose 25 mL/hr at 10/02/19 1441  . levETIRAcetam Stopped (10/01/19 2118)  . levETIRAcetam 500 mg (10/02/19 1025)  . phenytoin (DILANTIN) IV 110 mg (10/02/19 1613)   PRN Meds:.acetaminophen, dextrose, ipratropium-albuterol, Resource ThickenUp Clear, sodium chloride flush Medications Prior to Admission:  Prior to Admission medications   Medication Sig Start Date End Date Taking? Authorizing Provider  Cannabidiol (EPIDIOLEX) 100 MG/ML SOLN Take 3.74m twice a day Patient taking differently: Take 3.4 mLs by mouth in the morning and at bedtime. Take 3.767mtwice a day (will graduate up to 3.7) 06/14/19  Yes AqCameron SprangMD  carbidopa-levodopa (SINEMET IR) 25-100 MG tablet Take 1 tablet by mouth 3 (three) times daily. Take 1.5 tablet three times a day with meals Patient taking differently: Take 1.5 tablets by mouth 3 (three) times daily. Take 1.5 tablet three times a day with meals ( times given)  0800, 1300, 1900 08/31/19  Yes AqCameron SprangMD  citalopram (CELEXA) 20 MG tablet Take 20 mg by mouth daily.    Yes [provider]  cyanocobalamin (TH VITAMIN B12) 100 MCG tablet Take 100 mcg by mouth every morning.    Yes [provider]  folic acid (FOLVITE) 1 MG tablet Take 1 mg  by mouth  daily   Yes [provider]  levETIRAcetam (KEPPRA XR) 500 MG 24 hr tablet Take 1 tab in AM, 2 tabs in PM Patient taking differently: Take 500-1,000 mg by mouth See admin instructions. Take 1 tab (50021min AM, 2 tabs( 1000m47mn PM 08/31/19  Yes AquiCameron Sprang  levothyroxine (SYNTHROID, LEVOTHROID) 150 MCG tablet Take 150 mcg by mouth daily before breakfast.   Yes [provider]  OXYGEN Inhale 3 L into the lungs as directed. With CPAP    Yes [provider]  phenytoin (DILANTIN) 200 MG ER capsule Take 1 capsule (200 mg total) by mouth 2 (two) times daily. 08/31/19  Yes AquiCameron Sprang  Sennosides (SENNA) 8.8 MG/5ML SYRP TAKE 10 MLS BY MOUTH DAILY. AS NEEDED Patient taking differently: Take 10 mLs by mouth daily as needed (for constipation).  08/09/19  Yes Nandigam, KaviVenia Minks  simvastatin (ZOCOR) 40 MG tablet TAKE 40 MG BY MOUTH DAILY AT BEDTIME Patient taking differently: Take 40 mg by mouth at bedtime. Take 40 mg  by mouth daily at bedtime 03/28/19  Yes Allred, JameJeneen Rinks  sodium chloride (MURO 128) 5 % ophthalmic ointment Place 1 application into both eyes at bedtime.   Yes [provider]  XARELTO 20 MG TABS tablet TAKE 1 TABLET (20 MG TOTAL) BY MOUTH DAILY WITH SUPPER. Patient taking differently: Take 20 mg by mouth daily with supper.  07/21/19  Yes Allred, JaJeneen Rinks  Elastic Bandages & Supports (GAIT/TRANSFER BELT) MISC 1 Product by Does not apply route daily. 04/03/19   AquiCameron Sprang  furosemide (LASIX) 20 MG tablet Take 1 tablet (20 mg total) by mouth as needed for fluid or edema. Patient not taking: Reported on 09/22/2019 12/28/18   BhagLeanor Kail  polyethylene glycol powder (MIRALAX) powder Take 17 g by mouth 3 (three) times daily as  needed for mild constipation. Patient not taking: Reported on 09/22/2019 11/01/18   NandMauri Pole  risperiDONE (RISPERDAL) 1 MG tablet Take 0.5 mg  by mouth 2 (two) times daily. 1/2 tab bid    [provider]   Allergies  Allergen Reactions  . Cephalexin Other (See Comments)    Seizures   . Namenda [Memantine Hcl] Other (See Comments)    Made memory issues worse, sleepiness, increased confusion   Review of Systems  Unable to perform ROS: Acuity of condition    Physical Exam Vitals and nursing note reviewed.  Pulmonary:     Effort: Pulmonary effort is normal.  Skin:    Coloration: Skin is pale.  Neurological:     Mental Status: She is alert. Mental status is at baseline.     Vital Signs: BP (!) 119/53   Pulse 65   Temp 97.6 F (36.4 C) (Axillary)   Resp (!) 23   Ht 4' 10"  (1.473 m)   Wt 84 kg   SpO2 97%   BMI 38.70 kg/m  Pain Scale: Faces POSS *See Group Information*: S-Acceptable,Sleep, easy to arouse Pain Score: Asleep   SpO2: SpO2: 97 % O2 Device:SpO2: 97 % O2 Flow Rate: .O2 Flow Rate (L/min): 5 L/min  IO: Intake/output summary:   Intake/Output Summary (Last 24 hours) at 10/02/2019 1639 Last data filed at 10/02/2019 0200 Gross per 24 hour  Intake --  Output 1400 ml  Net -1400 ml    LBM: Last BM Date: 10/01/19 Baseline Weight: Weight: 70.3 kg Most recent weight: Weight: 84 kg     Palliative Assessment/Data: PPS: 10%     Thank you for this consult. Palliative medicine will continue to follow and assist as needed.   Time In: 1500, 1615 Time Out: 1545, 1700 Time Total: 90 minutes Greater than 50%  of this time was spent counseling and coordinating care related to the above assessment and plan.  Signed by: Mariana Kaufman, AGNP-C Palliative Medicine    Please contact Palliative Medicine Team phone at [phone] for questions and concerns.  For individual provider: See Shea Evans

## 2019-10-02 NOTE — Progress Notes (Signed)
NAME:  Kaitlyn Powell, MRN:  [id number], DOB:  1955-10-03, LOS: 10 ADMISSION DATE:  09/21/2019, CONSULTATION DATE:  2/25 REFERRING MD:  Sandre Kitty, CHIEF COMPLAINT:  hypoxia   Brief History   Admitted with aspiration pneumonia, now requiring bipap frequently for desaturations and varying [place] O2 requirements.  History of present illness   History provided by the patient's sister [name] at bedside.  Mrs. Semel is a 64 year old woman with a history of Down syndrome and recent diagnosis of Parkinson's disease, OSA on home BiPAP who was admitted with bilateral lower lobe aspiration pneumonia on she has been treated with ampicillin-sulbactam since 2/19 but has ongoing high oxygen requirements.  She often desaturates when she is taken off BiPAP for the past few days.  Her sister relates that she has been out of bed only one time to the chair.  It has been difficult to get her even sitting at the edge of the bed.  She is very concerned about her sisters ability to regain her functional status with her progressive decline at home from her Parkinson's disease.  She has been getting chest percussion therapy every 4 hours, but struggles with incentive spirometry and flutter valve due to ability to coordinate these maneuvers and mouth strength.  Has not been able to expectorate sputum.  She has had ongoing fevers despite antibiotics.  She has been evaluated by speech therapy and recommended dysphagia 1 diet with nectar thick liquids.  Past Medical History  Trisomy 67 Parkinson's disease OSA PE, DVT-on Xarelto prior to admission GERD Hypothyroidism Heart block with pacemaker placement  Significant Hospital Events     Consults:  Pulmonology SLP  Procedures:  L IJ CVC 09/21/2019  Significant Diagnostic Tests:  CT chest 09/20/18-personally reviewed-bibasilar consolidations with air bronchograms, unclear if small dependent pleural effusions as well. CXR 2/24, 1 view-bibasilar consolidation with silhouetting of  hemidiaphragms  Micro Data:  2/19 blood cultures-no growth 2/18 respiratory panel-negative  Antimicrobials:  Zosyn 2/18, 2/26> Vanc 2/18 Ampicillin-sulbactam 2/19>>2/25 Vanc 2/25 Meropenem 2/25>2/26  Interim history/subjective:  No overnight issues.  Patient is resting comfortably at the time of my evaluation this morning, in no respiratory distress. No family at bedside.   Objective   Blood pressure (!) 119/53, pulse 66, temperature 97.6 F (36.4 C), temperature source Axillary, resp. rate (!) 21, height 4\' 10"  (1.473 m), weight 84 kg, SpO2 98 %.    FiO2 (%):  [40 %] 40 %   Intake/Output Summary (Last 24 hours) at 10/02/2019 1407 Last data filed at 10/02/2019 0200 Gross per 24 hour  Intake --  Output 1400 ml  Net -1400 ml   Filed Weights   09/29/19 0406 10/01/19 0347 10/02/19 0405  Weight: 80.8 kg 85.6 kg 84 kg   Physical Exam: General: Chronically ill appearing, Down syndrome features, no acute distress, resting comfortably in chair HENT: [place], AT, OP clear, MMM Eyes: EOMI, no scleral icterus Respiratory: Diminished breath sounds bilaterally. No crackles, wheezing or rales Cardiovascular: RRR, -M/R/G, no JVD GI: BS+, soft, nontender Extremities: 1+ pitting edema in lower extremities Neuro: Awake, interactive, moves extremities x 4 Skin: Intact, no rashes or bruising  CXR 2/28 - diffuse bilateral opacities concerning for patient's noted history of aspiration.   Resolved Hospital Problem list     Assessment & Plan:   Acute hypoxemic respiratory failure secondary to severe pneumonia due to recurrent aspiration, atelectasis OSA on CPAP   --Wean supplemental O2 for goal SpO2 >90%, currently on [facility], needs a little more if sleeping and  not wearing CPAP.  --Pulmonary hygiene: CPT q4h when awake, flutter and IS as able with her orofacial weakness tum culture --Completed 5 days of HCAP therapy (last day is today 3/1) --BiPAP nightly and as needed with naps -Speech  following. PO trials currently on hold.   Dysphagia --Continue dysphagia diet --Aspiration precautions --plan is for PEG tube placement - being discussed by GI/IR/Surgical teams.   Pre-operative Pulmonary Evaluation The features of this patient's history that contribute to the pulmonary risk assessment include: Age, pre-operative anemia, chronic hypoxemia, and recent upper respiratory infection.   This patient has a high risk of post-operative pulmonary complications by ARISCAT Index for an otherwise moderate risk procedure such as PEG tube placement.  The absolute assessment of risk/benefit of the procedure is deferred to the primary team's evaluation.  - Patient's Estimated risk of postoperative respiratory failure is 42% based on the ARISCAT Index.   0 to 25 points: Low risk: 5.8% pulmonary complication rate  26 to 44 points: Intermediate risk: 09.9% pulmonary complication rate  45 to 123 points: High risk: 83.3% pulmonary complication rate  Postoperative respiratory failure (PRF) is considered as failure to wean from mechanical ventilation within 48 hours of surgery or unplanned intubation/reintubation postoperatively. The validated risk calculator provides a risk estimate of PRF and is anticipated to aid in surgical decision-making and informed patient consent. However risk can be accepted given the potential benefit of this intervention and it is not prohibitive.  ARISCAT: Mazo et al. Anesthesiology [phone]   Lenice Llamas, MD Pulmonary and [place] Pager: [place]   CBC: Recent Labs  Lab 09/26/19 0326 09/28/19 0300 09/30/19 0513 10/01/19 0312  WBC 5.7 6.7 5.5 4.9  HGB 8.5* 8.2* 8.5* 7.9*  HCT 26.2* 25.3* 26.2* 24.9*  MCV 103.6* 105.0* 105.2* 106.9*  PLT 110* 129* 148* 132*    Basic Metabolic Panel: Recent Labs  Lab 09/27/19 0317 09/28/19 0300 09/30/19 0513 10/01/19 0312 10/02/19 0320  NA  145 143 144 143 142  K 3.8 3.5 2.8* 3.5 3.1*  CL 108 105 106 108 101  CO2 28 28 30 28 30   GLUCOSE 110* 99 118* 105* 124*  BUN 12 13 12 11 8   CREATININE 0.84 0.77 0.93 0.79 0.79  CALCIUM 8.0* 7.9* 8.0* 7.9* 8.1*  MG  --   --   --  1.5* 1.4*  PHOS  --   --   --   --  3.1   GFR: Estimated Creatinine Clearance: 66 mL/min (by C-G formula based on SCr of 0.79 mg/dL). Recent Labs  Lab 09/26/19 0326 09/28/19 0300 09/30/19 0513 10/01/19 0312  WBC 5.7 6.7 5.5 4.9    Liver Function Tests: Recent Labs  Lab 10/02/19 0320  ALBUMIN 1.6*   No results for input(s): LIPASE, AMYLASE in the last 168 hours. No results for input(s): AMMONIA in the last 168 hours.  ABG    Component Value Date/Time   HCO3 32.5 (H) 09/28/2019 0535   TCO2 27 09/21/2019 2029   O2SAT 43.3 09/28/2019 0535     Coagulation Profile: No results for input(s): INR, PROTIME in the last 168 hours.  Cardiac Enzymes: No results for input(s): CKTOTAL, CKMB, CKMBINDEX, TROPONINI in the last 168 hours.  HbA1C: No results found for: HGBA1C  CBG: Recent Labs  Lab 10/02/19 0003 10/02/19 0240 10/02/19 0358 10/02/19 0801 10/02/19 1255  GLUCAP 72 67* 113* 84 74

## 2019-10-02 NOTE — Progress Notes (Signed)
Subjective: HD#10 Events Overnight: no events overnight  Patient was seen this morning on rounds.  Patient was resting in bed.  That she is concerned that patient is has not been able to get nutrition.  I spoke with the patient and her family regarding the risks and the benefits of IR versus surgical gastric tube placement.  I also counseled him on the use of core track NG tube for tube feeds.  The family admits to understanding.  Objective:  Vital signs in last 24 hours: Vitals:   10/01/19 2115 10/01/19 2325 10/02/19 0005 10/02/19 0405  BP:  (!) 98/49 (!) 97/45 (!) 106/49  Pulse:  73 66 66  Resp:  17 19 16   Temp:   98.2 F (36.8 C) 98.3 F (36.8 C)  TempSrc:   Axillary Axillary  SpO2: 90% 92% 99% 98%  Weight:    84 kg  Height:        Physical Exam: Physical Exam  Constitutional: She is oriented to person, place, and time and well-developed, well-nourished, and in no distress.  HENT:  Head: Normocephalic and atraumatic.  Eyes: EOM are normal.  Cardiovascular: Normal rate and intact distal pulses.  Pulmonary/Chest: Effort normal. No respiratory distress. She exhibits no tenderness.  Abdominal: Soft. She exhibits no distension. There is no abdominal tenderness.  Musculoskeletal:        General: No tenderness or edema. Normal range of motion.     Cervical back: Normal range of motion.  Neurological: She is alert and oriented to person, place, and time.  Skin: Skin is warm and dry.    Filed Weights   09/29/19 0406 10/01/19 0347 10/02/19 0405  Weight: 80.8 kg 85.6 kg 84 kg     Intake/Output Summary (Last 24 hours) at 10/02/2019 0548 Last data filed at 10/02/2019 0200 Gross per 24 hour  Intake 199.93 ml  Output 1400 ml  Net -1200.07 ml    Pertinent labs/Imaging: CBC Latest Ref Rng & Units 10/01/2019 09/30/2019 09/28/2019  WBC 4.0 - 10.5 K/uL 4.9 5.5 6.7  Hemoglobin 12.0 - 15.0 g/dL 7.9(L) 8.5(L) 8.2(L)  Hematocrit 36.0 - 46.0 % 24.9(L) 26.2(L) 25.3(L)  Platelets 150 -  400 K/uL 132(L) 148(L) 129(L)    CMP Latest Ref Rng & Units 10/02/2019 10/01/2019 09/30/2019  Glucose 70 - 99 mg/dL 124(H) 105(H) 118(H)  BUN 8 - 23 mg/dL 8 11 12   Creatinine 0.44 - 1.00 mg/dL 0.79 0.79 0.93  Sodium 135 - 145 mmol/L 142 143 144  Potassium 3.5 - 5.1 mmol/L 3.1(L) 3.5 2.8(L)  Chloride 98 - 111 mmol/L 101 108 106  CO2 22 - 32 mmol/L 30 28 30   Calcium 8.9 - 10.3 mg/dL 8.1(L) 7.9(L) 8.0(L)  Total Protein 6.5 - 8.1 g/dL - - -  Total Bilirubin 0.3 - 1.2 mg/dL - - -  Alkaline Phos 38 - 126 U/L - - -  AST 15 - 41 U/L - - -  ALT 0 - 44 U/L - - -    DG CHEST PORT 1 VIEW  Result Date: 10/01/2019 CLINICAL DATA:  Hypoxemia. EXAM: PORTABLE CHEST 1 VIEW COMPARISON:  09/27/2019 and prior radiographs FINDINGS: Cardiomegaly, RIGHT-sided pacemaker and pulmonary vascular congestion again noted. Diffuse bilateral airspace opacities are again noted. There is no evidence of pneumothorax. There has been little change since the prior study. IMPRESSION: Unchanged appearance of the chest with diffuse bilateral airspace opacities. Electronically Signed   By: Margarette Canada M.D.   On: 10/01/2019 15:06    Assessment/Plan:  Principal Problem:   Aspiration pneumonia (HCC) Active Problems:   Down syndrome   OSA treated with BiPAP   Acquired hypothyroidism   History of pulmonary embolism    Patient Summary: Kaitlyn Powell is a 64 y.o. with pertinent PMH of Down Syndrome, Parkinson's Disease, hypothyroidism, DVT/PE (not on anticoagulation), heart block with pacemaker, admit for aspiration pneumonia on hospital day 10  # Aspiration pneumonia: Patient presented with signs and symptoms concerning for aspiration pneumonia.  Patient shows no clinical signs of infection. She is not longer febrile and is maintaining O2 saturations on RA.This will be discontinued today. - Continue to monitor patient's respiratory status. Keep SpO2 at 88% or greater.  - Discontinued antibiotics today  #Malnutrition:   Patient continues to have altered mental status in the setting of her recent infection.  She is still having a difficult time tolerating p.o. intake and is at high risk for repeat aspiration.  Patient was counseled on the risks and the benefits of NG tube and gastric tube placement.  We discussed the risk of repeat aspiration despite tube feeds.  Despite considerable deliberation between the family and the consulting services, the patient's family would like to have a core track NG tube placed tomorrow and a PEG tube placed by IR. -Core track NG tube placement for tomorrow -PEG tube placement likely scheduled for Wednesday via IR -She will need to be n.p.o. for these procedures.  # Down syndrome # Parkinson's disease -To new carbidopa levodopa 3 times daily with pure -Restart Parkinson's medications including citalopram, risperidone and cannabidiol once NG tube placed.   # Seizure disorder *Continue IV Keppra 1000 mg every evening, 500mg  every morning *Phenytoin per pharmacy consult - 130 mgIVevery 8 hours *Cannabidiol 370 mgPOtwice daily- will restart when patient has G-tube  # Hypothyroidism:Continue IV levothyroxine 75 mg daily  # Hx of DVT: Considering her history of DVT and heart block patient will likely need long term anticoagulation.  We will need to reevaluate which oral anticoagulate will be best for the patient.  Currently on enoxaparin for DVT prophylaxis.   # Obstructive sleep apnea:ContinueBiPap at night  # Nutrition: Vitamin B12 100 mcg daily - will restart when patient has G-tube # Hyperlipidemia:Simvastatin 40 mg daily- will restart when patient has G-tube # Constipation: Sena 1 tablet PRN once daily for constipation - restart when patient has G-tube   Diet: N.p.o. IVF: D10 at 25 cc/h VTE: Enoxaparin Code: Code PT/OT recs: Pending TOC recs: Pending   Dispo: Anticipated discharge pending clinical improvement.    , D.O. [facility],  PGY-1 Date 10/02/2019 Time 5:48 AM

## 2019-10-02 NOTE — Progress Notes (Signed)
Patient's sister, [name], approached this RN at 1515 and asked if all of the medical professionals that were on Taelyr's case could be paged and asked to come to the floor to speak with her. This RN started by paging Dr. Dortha Schwalbe with Internal Medicine at 1518 and explaining the sister's request. Interventional Radiology was then called and this RN spoke with them regarding plan of care for the patient. This RN was informed that Lora Havens spoke with [facility] in regards to placing a PEG tube via IR and that is was declined. Dr. Matthias Hughs with Gastroenterology was then paged and this RN spoke with him regarding the Panda tube that he had mentioned to the family, placing an NG tube vs Cortrak and the offer of the tube placement with IR being declined. A discussion was then had with Lora Havens, NP with Palliative Care regarding the plan of care for the patient. This RN, Lora Havens, NP, and Dr. Marchia Bond were all present at bedside with Mellody Dance, [name]'s Husband, and [place] and had a long conversation to come up with a plan of care for the patient.

## 2019-10-02 NOTE — Consult Note (Signed)
Chief Complaint: Patient was seen in consultation today for dysphagia, aspiration pneumonia  Referring Physician(s): Dr. Matthias Hughs  Supervising Physician: Richarda Overlie  Patient Status: [facility] - In-pt  History of Present Illness: [facility] is a 64 y.o. female with Down syndrome, new diagnosis of Parkinson's disease with rapid progression over the past several months.  She has a history of dysphagia for which she has had NG/NJ and gastrostomy tubes placed.  Spoke with sister, [name], over the phone who reports that a PEG was placed in September 2017 [place] [facility] [place]  She recounts that at the time of tube placement, they were told that Jovanna's anatomy was difficult for tube placement attempt, however an attempt was made and the tube was successfully placed.  She was unable to return to her group home in [place] and was therefore brought down to live with her sister in [place].  Within 4-5 weeks after placement, the tube became difficult and painful to use.  She presented to [facility] for evaluation and the tube was found to be dislodged.  It was successfully removed and not replaced.  She has had at least 1 NGT since this time, however was ultimately able to advance diet with tolerance. Patient now presents with aspiration pneumonia.  She has been able to advance diet to Dysphagia 1 with nectar thick liquids. IV abx recently broadened to include Zosyn due to ongoing fevers.  Her vitals are improved today: HR 65, BP 119/53, RR 23 on 5L [place], temp 98.3. She does continue BiPAP at night.   Family is interested in pursuing gastrostomy tube placement.   IR was consulted for possible placement, however a CT Abdomen Pelvis reviewed by Dr. Deanne Coffer who felt patient at high risk for percutaneous placement given lack of window with stomach decompressed.   Patient has also been evaluated by surgery as well as GI who also feel patient is at high risk for tube placement.   IR re-consulted today by Dr. Matthias Hughs  for reconsideration of percutaneous gastrostomy tube placement.   Case reviewed by Dr. Archer Asa who is agreeable to procedure attempt.   Past Medical History:  Diagnosis Date  . Arthritis   . Aspiration pneumonia (HCC)   . Asthma   . Cardiac arrhythmia    have pacemeaker  . Down syndrome   . DVT of popliteal vein (HCC) 09/08/2018   chronic  . GERD (gastroesophageal reflux disease)   . Heart block   . Hypothyroidism   . Mental retardation   . Pacemaker   . Pulmonary emboli (HCC)   . Seizures (HCC)   . Sleep apnea with use of continuous positive airway pressure (CPAP)     Past Surgical History:  Procedure Laterality Date  . PACEMAKER INSERTION  12/09/2015   [employer] Accolade MRI EL PPM implanted in [place] for complete heart block and syncope  . PEG TUBE PLACEMENT     removed in 2017  . TIBIA FRACTURE SURGERY Right     Allergies: Cephalexin and Namenda [memantine hcl]  Medications: Prior to Admission medications   Medication Sig Start Date End Date Taking? Authorizing Provider  Cannabidiol (EPIDIOLEX) 100 MG/ML SOLN Take 3.100mL twice a day Patient taking differently: Take 3.4 mLs by mouth in the morning and at bedtime. Take 3.42mL twice a day (will graduate up to 3.7) 06/14/19  Yes Van Clines, MD  carbidopa-levodopa (SINEMET IR) 25-100 MG tablet Take 1 tablet by mouth 3 (three) times daily. Take 1.5 tablet three times a  day with meals Patient taking differently: Take 1.5 tablets by mouth 3 (three) times daily. Take 1.5 tablet three times a day with meals ( times given)  0800, 1300, 1900 08/31/19  Yes Cameron Sprang, MD  citalopram (CELEXA) 20 MG tablet Take 20 mg by mouth daily.   Yes [provider]  cyanocobalamin (TH VITAMIN B12) 100 MCG tablet Take 100 mcg by mouth every morning.    Yes [provider]  folic acid (FOLVITE) 1 MG tablet Take 1 mg  by mouth  daily   Yes [provider]  levETIRAcetam (KEPPRA XR) 500 MG 24 hr tablet  Take 1 tab in AM, 2 tabs in PM Patient taking differently: Take 500-1,000 mg by mouth See admin instructions. Take 1 tab (500mg ) in AM, 2 tabs( 1000mg ) in PM 08/31/19  Yes Cameron Sprang, MD  levothyroxine (SYNTHROID, LEVOTHROID) 150 MCG tablet Take 150 mcg by mouth daily before breakfast.   Yes [provider]  OXYGEN Inhale 3 L into the lungs as directed. With CPAP    Yes [provider]  phenytoin (DILANTIN) 200 MG ER capsule Take 1 capsule (200 mg total) by mouth 2 (two) times daily. 08/31/19  Yes Cameron Sprang, MD  Sennosides (SENNA) 8.8 MG/5ML SYRP TAKE 10 MLS BY MOUTH DAILY. AS NEEDED Patient taking differently: Take 10 mLs by mouth daily as needed (for constipation).  08/09/19  Yes Nandigam, Venia Minks, MD  simvastatin (ZOCOR) 40 MG tablet TAKE 40 MG BY MOUTH DAILY AT BEDTIME Patient taking differently: Take 40 mg by mouth at bedtime. Take 40 mg  by mouth daily at bedtime 03/28/19  Yes Allred, Jeneen Rinks, MD  sodium chloride (MURO 128) 5 % ophthalmic ointment Place 1 application into both eyes at bedtime.   Yes [provider]  XARELTO 20 MG TABS tablet TAKE 1 TABLET (20 MG TOTAL) BY MOUTH DAILY WITH SUPPER. Patient taking differently: Take 20 mg by mouth daily with supper.  07/21/19  Yes AllredJeneen Rinks, MD  Elastic Bandages & Supports (GAIT/TRANSFER BELT) MISC 1 Product by Does not apply route daily. 04/03/19   Cameron Sprang, MD  furosemide (LASIX) 20 MG tablet Take 1 tablet (20 mg total) by mouth as needed for fluid or edema. Patient not taking: Reported on 09/22/2019 12/28/18   Leanor Kail, PA  polyethylene glycol powder (MIRALAX) powder Take 17 g by mouth 3 (three) times daily as needed for mild constipation. Patient not taking: Reported on 09/22/2019 11/01/18   Mauri Pole, MD  risperiDONE (RISPERDAL) 1 MG tablet Take 0.5 mg by mouth 2 (two) times daily. 1/2 tab bid    [provider]     Family History  Problem Relation Age of Onset  .  Diabetes Father   . Heart disease Father   . Breast cancer Sister     Social History   Socioeconomic History  . Marital status: Single    Spouse name: Not on file  . Number of children: Not on file  . Years of education: Not on file  . Highest education level: Not on file  Occupational History  . Occupation: disabled  Tobacco Use  . Smoking status: Never Smoker  . Smokeless tobacco: Never Used  Substance and Sexual Activity  . Alcohol use: No  . Drug use: No  . Sexual activity: Never  Other Topics Concern  . Not on file  Social History Narrative   Pt is left handed   Lives in 2 story  home with her sister, brother-in-law and BIL's brother (pt stays on bottom floor)   Social Determinants of Health   Financial Resource Strain:   . Difficulty of Paying Living Expenses: Not on file  Food Insecurity:   . Worried About Programme researcher, broadcasting/film/video in the Last Year: Not on file  . Ran Out of Food in the Last Year: Not on file  Transportation Needs:   . Lack of Transportation (Medical): Not on file  . Lack of Transportation (Non-Medical): Not on file  Physical Activity:   . Days of Exercise per Week: Not on file  . Minutes of Exercise per Session: Not on file  Stress:   . Feeling of Stress : Not on file  Social Connections:   . Frequency of Communication with Friends and Family: Not on file  . Frequency of Social Gatherings with Friends and Family: Not on file  . Attends Religious Services: Not on file  . Active Member of Clubs or Organizations: Not on file  . Attends Banker Meetings: Not on file  . Marital Status: Not on file     Review of Systems: A 12 point ROS discussed and pertinent positives are indicated in the HPI above.  All other systems are negative.  Review of Systems  Unable to perform ROS: Dementia    Vital Signs: BP (!) 119/53   Pulse 65   Temp 97.6 F (36.4 C) (Axillary)   Resp (!) 23   Ht 4\' 10"  (1.473 m)   Wt 185 lb 3 oz (84 kg)   SpO2  97%   BMI 38.70 kg/m   Physical Exam Constitutional:      General: She is not in acute distress.    Appearance: Normal appearance. She is not ill-appearing.  Cardiovascular:     Rate and Rhythm: Normal rate and regular rhythm.  Pulmonary:     Effort: Pulmonary effort is normal. No respiratory distress.  Abdominal:     General: Abdomen is flat. There is no distension.     Palpations: Abdomen is soft.     Tenderness: There is no abdominal tenderness.     Comments: Pale scar to LUQ from prior placement  Skin:    General: Skin is warm and dry.  Neurological:     General: No focal deficit present.     Mental Status: She is alert and oriented to person, place, and time. Mental status is at baseline.  Psychiatric:        Mood and Affect: Mood normal.        Behavior: Behavior normal.        Thought Content: Thought content normal.        Judgment: Judgment normal.      MD Evaluation Airway: WNL Heart: WNL Abdomen: WNL Chest/ Lungs: WNL ASA  Classification: 3 Mallampati/Airway Score: One   Imaging: CT ABDOMEN WO CONTRAST  Result Date: 09/29/2019 CLINICAL DATA:  Preop planning, gastrostomy placement EXAM: CT ABDOMEN WITHOUT CONTRAST TECHNIQUE: Multidetector CT imaging of the abdomen was performed following the standard protocol without IV contrast. COMPARISON:  CT chest 09/21/2019, CT abdomen 02/07/2018 FINDINGS: Lower chest: Small bilateral pleural effusions. Consolidation/atelectasis with air bronchograms posteriorly in bilateral visualized lung bases. Right atrial and ventricular pacing leads partially visualized. Hepatobiliary: No focal liver abnormality is seen. No gallstones, gallbladder wall thickening, or biliary dilatation. Pancreas: Unremarkable. No pancreatic ductal dilatation or surrounding inflammatory changes. Spleen: Normal in size without focal abnormality. Adrenals/Urinary Tract: Mild bilateral adrenal hypertrophy. Kidneys  normal in morphology without hydronephrosis  or urolithiasis. Stomach/Bowel: Stomach is decompressed, with no safe percutaneous window for gastrostomy placement currently evident due to overlying colon and left lobe of the liver. Visualized small bowel decompressed. There is residual contrast material in the transverse colon. Vascular/Lymphatic: Minimal Aortic Atherosclerosis (ICD10-170.0). No adenopathy localized. Other: No free air. No abdominal ascites. Musculoskeletal: Progressive compression deformities of L1, L2, and L3 vertebral bodies with some posterior retropulsion into the spinal canal at all 3 levels. Degenerative disc disease L4-S1. IMPRESSION: 1. Stomach is decompressed, with no safe percutaneous window for gastrostomy placement currently evident. 2. Progressive compression deformities of L1, L2, and L3 vertebral bodies. 3. Small bilateral pleural effusions with bibasilar consolidation/atelectasis. 4. Aortoiliac Atherosclerosis (ICD10-170.0). Electronically Signed   By: Corlis Leak M.D.   On: 09/29/2019 16:31   CT Chest Wo Contrast  Result Date: 09/21/2019 CLINICAL DATA:  Chest pain and shortness of breath. Pleurisy or effusion suspected. Symptoms began today. EXAM: CT CHEST WITHOUT CONTRAST TECHNIQUE: Multidetector CT imaging of the chest was performed following the standard protocol without IV contrast. COMPARISON:  Chest radiography same day.  Chest CT 12/23/2018 FINDINGS: Cardiovascular: Heart size is normal. Pacemaker in place. Left internal jugular central line passes through a left SVC with its tip in the left atrium. There is aortic atherosclerotic calcification. Coronary artery calcification is noted. Mediastinum/Nodes: No mediastinal or hilar mass or adenopathy seen. Lungs/Pleura: Small amount of pleural fluid layering dependently. Consolidation/pneumonia in both dependent lungs, lower lobe predominant. Areas of patchy perihilar infiltrate also present. Upper Abdomen: Negative Musculoskeletal: Partial compression fractures at T1, T3  and T5. Compression fracture at T12 with near vertebra plana. Upper fractures are stable. The T12 fracture has progressed somewhat since the study of May 2020. IMPRESSION: Bilateral lower lobe consolidation and collapse consistent with extensive bronchopneumonia. Very small amount of layering pleural fluid. Aortic Atherosclerosis (ICD10-I70.0). Electronically Signed   By: Paulina Fusi M.D.   On: 09/21/2019 22:46   DG CHEST PORT 1 VIEW  Result Date: 10/01/2019 CLINICAL DATA:  Hypoxemia. EXAM: PORTABLE CHEST 1 VIEW COMPARISON:  09/27/2019 and prior radiographs FINDINGS: Cardiomegaly, RIGHT-sided pacemaker and pulmonary vascular congestion again noted. Diffuse bilateral airspace opacities are again noted. There is no evidence of pneumothorax. There has been little change since the prior study. IMPRESSION: Unchanged appearance of the chest with diffuse bilateral airspace opacities. Electronically Signed   By: Harmon Pier M.D.   On: 10/01/2019 15:06   DG CHEST PORT 1 VIEW  Result Date: 09/27/2019 CLINICAL DATA:  Dyspnea. EXAM: PORTABLE CHEST 1 VIEW COMPARISON:  September 21, 2019. FINDINGS: Stable cardiomediastinal silhouette. Right internal jugular pacemaker is unchanged in position. Central pulmonary vascular congestion is noted. No pneumothorax is noted. Increased bibasilar opacities are noted concerning for worsening edema, atelectasis or infiltrates with associated pleural effusions. Bony thorax is unremarkable. IMPRESSION: Increased bibasilar opacities are noted concerning for worsening edema, atelectasis or infiltrates with associated pleural effusions. Electronically Signed   By: Lupita Raider M.D.   On: 09/27/2019 12:57   DG Chest Portable 1 View  Result Date: 09/21/2019 CLINICAL DATA:  64 year old female with respiratory distress. Status post central line placement. EXAM: PORTABLE CHEST 1 VIEW COMPARISON:  Chest radiograph dated 12/19/2018 and CT dated 12/23/2018. FINDINGS: Left IJ central venous  line extends straight down with tip over the heart. This is likely within a left-sided SVC variant anatomy. Correlation with clinical exam is recommended to exclude arterial blood. On cardiac CT of 01/03/2019 a left-sided SVC variant  anatomy is present. Bibasilar patchy densities may represent atelectasis or infiltrate. Small bilateral pleural effusions may be present. No pneumothorax. The cardiac silhouette is within normal limits. Atherosclerotic calcification of the aortic arch. Right-sided dual lead pacemaker device. Old left clavicular fracture. No acute osseous pathology. IMPRESSION: 1. Left IJ central venous line with tip likely in the region of the right atrium via a left-sided SVC variant anatomy. Clinical correlation is recommended. No pneumothorax. 2. Bibasilar atelectasis or infiltrate. Electronically Signed   By: Elgie CollardArash  Radparvar M.D.   On: 09/21/2019 21:55   DG Swallowing Func-Speech Pathology  Result Date: 09/25/2019 Objective Swallowing Evaluation: Type of Study: MBS-Modified Barium Swallow Study  Patient Details Name: Mickel FuchsJeanne Gangwer MRN: [id number] Date of Birth: Dec 22, 1955 Today's Date: 09/25/2019 Time: SLP Start Time (ACUTE ONLY): 1145 -SLP Stop Time (ACUTE ONLY): 1210 SLP Time Calculation (min) (ACUTE ONLY): 25 min Past Medical History: Past Medical History: Diagnosis Date . Arthritis  . Aspiration pneumonia (HCC)  . Asthma  . Cardiac arrhythmia   have pacemeaker . Down syndrome  . DVT of popliteal vein (HCC) 09/08/2018  chronic . GERD (gastroesophageal reflux disease)  . Heart block  . Hypothyroidism  . Mental retardation  . Pacemaker  . Pulmonary emboli (HCC)  . Seizures (HCC)  . Sleep apnea with use of continuous positive airway pressure (CPAP)  Past Surgical History: Past Surgical History: Procedure Laterality Date . PACEMAKER INSERTION  12/09/2015  [employer] Accolade MRI EL PPM implanted in [place] for complete heart block and syncope . PEG TUBE PLACEMENT    removed in 2017 . TIBIA  FRACTURE SURGERY Right  HPI: 64 y.o with a hx of Down Syndrome, recent dx Parkinson's disease, DVT, heart block with a pacemaker in 2017, hypothyroidism, GERD, seizures, osteoporosis, multiple chronic T-spine fractures, and sleep apnea who presented to the ED with hypoxia and respiratory distress. Pt with increased respiratory effort and SOB, appeared to aspirate when eating soup.  Arrived to ED requiring CPAP and weaned to 10L nasal cannula.  Per notes, pt has hx of aspiration pna, remote hx PEG (removed 2017 after swallowing issues were resolved).  Evaluated by this SLP on 08/02/18, at which time she presented with quite functional swallow with no overt concerns for aspiration - soft diet and thin liquids were recommended.  Her sister reports slight worsening in swallow function the last few months.  Subjective: alert, smiling Assessment / Plan / Recommendation CHL IP CLINICAL IMPRESSIONS 09/25/2019 Clinical Impression At time of MBS, pt with significant wheezing and work of breath, though O2 saturation remained WNL. She was also noted to have very dry oral mucosa and needed total assist for neutral head positioning and feeding. She had poor labial closure with a spoon and cup and could not sip from a straw or masticate. All of this is likely very different from her baseline function. Primary impairment seen on MBS was poor oral control with premature spillage and slight delay in swallow initaition with thin liquids. Thin liquids hit the pyriforms with 2 instances of flash frank penetration and two instance of very trace sensed aspiration. Nectar and puree tolerated well, though given dry mucosa, there was some coating/residue in the pharynx. No significant dysphagia or silent aspiration to explain pts presentation today (consumption of breakfast with acute change in respiratory function afterwards). Would recommend continuing PO, with a change to puree diet and nectar thick liquids for added precaution. Pt seems  to be at risk for fluctuating function and risk of aspiration may persist  with any texture trialed.  SLP Visit Diagnosis Dysphagia, oropharyngeal phase (R13.12) Attention and concentration deficit following -- Frontal lobe and executive function deficit following -- Impact on safety and function Moderate aspiration risk;Mild aspiration risk   CHL IP TREATMENT RECOMMENDATION 09/25/2019 Treatment Recommendations Therapy as outlined in treatment plan below   Prognosis 09/22/2019 Prognosis for Safe Diet Advancement Good Barriers to Reach Goals -- Barriers/Prognosis Comment -- CHL IP DIET RECOMMENDATION 09/25/2019 SLP Diet Recommendations Dysphagia 1 (Puree) solids;Nectar thick liquid Liquid Administration via Spoon;Cup Medication Administration Crushed with puree Compensations Slow rate;Small sips/bites Postural Changes Seated upright at 90 degrees   CHL IP OTHER RECOMMENDATIONS 09/25/2019 Recommended Consults -- Oral Care Recommendations Oral care BID Other Recommendations Have oral suction available   CHL IP FOLLOW UP RECOMMENDATIONS 09/25/2019 Follow up Recommendations 24 hour supervision/assistance   CHL IP FREQUENCY AND DURATION 09/25/2019 Speech Therapy Frequency (ACUTE ONLY) min 2x/week Treatment Duration 2 weeks      CHL IP ORAL PHASE 09/25/2019 Oral Phase Impaired Oral - Pudding Teaspoon -- Oral - Pudding Cup -- Oral - Honey Teaspoon -- Oral - Honey Cup -- Oral - Nectar Teaspoon Decreased bolus cohesion;Lingual/palatal residue Oral - Nectar Cup Decreased bolus cohesion;Delayed oral transit Oral - Nectar Straw -- Oral - Thin Teaspoon -- Oral - Thin Cup Delayed oral transit;Decreased bolus cohesion;Premature spillage Oral - Thin Straw -- Oral - Puree Delayed oral transit Oral - Mech Soft -- Oral - Regular -- Oral - Multi-Consistency -- Oral - Pill -- Oral Phase - Comment --  CHL IP PHARYNGEAL PHASE 09/25/2019 Pharyngeal Phase Impaired Pharyngeal- Pudding Teaspoon -- Pharyngeal -- Pharyngeal- Pudding Cup -- Pharyngeal --  Pharyngeal- Honey Teaspoon -- Pharyngeal -- Pharyngeal- Honey Cup -- Pharyngeal -- Pharyngeal- Nectar Teaspoon Delayed swallow initiation-vallecula Pharyngeal -- Pharyngeal- Nectar Cup Delayed swallow initiation-vallecula Pharyngeal -- Pharyngeal- Nectar Straw -- Pharyngeal -- Pharyngeal- Thin Teaspoon -- Pharyngeal -- Pharyngeal- Thin Cup Delayed swallow initiation-pyriform sinuses;Penetration/Aspiration before swallow;Penetration/Aspiration during swallow Pharyngeal Material enters airway, passes BELOW cords and not ejected out despite cough attempt by patient;Material enters airway, CONTACTS cords and then ejected out;Material does not enter airway Pharyngeal- Thin Straw -- Pharyngeal -- Pharyngeal- Puree WFL Pharyngeal -- Pharyngeal- Mechanical Soft -- Pharyngeal -- Pharyngeal- Regular -- Pharyngeal -- Pharyngeal- Multi-consistency -- Pharyngeal -- Pharyngeal- Pill -- Pharyngeal -- Pharyngeal Comment --  No flowsheet data found. Harlon Ditty, MA CCC-SLP Acute Rehabilitation Services Pager [phone] Office [phone] Claudine Mouton 09/25/2019, 1:37 PM               Labs:  CBC: Recent Labs    09/26/19 0326 09/28/19 0300 09/30/19 0513 10/01/19 0312  WBC 5.7 6.7 5.5 4.9  HGB 8.5* 8.2* 8.5* 7.9*  HCT 26.2* 25.3* 26.2* 24.9*  PLT 110* 129* 148* 132*    COAGS: No results for input(s): INR, APTT in the last 8760 hours.  BMP: Recent Labs    09/28/19 0300 09/30/19 0513 10/01/19 0312 10/02/19 0320  NA 143 144 143 142  K 3.5 2.8* 3.5 3.1*  CL 105 106 108 101  CO2 28 30 28 30   GLUCOSE 99 118* 105* 124*  BUN 13 12 11 8   CALCIUM 7.9* 8.0* 7.9* 8.1*  CREATININE 0.77 0.93 0.79 0.79  GFRNONAA >60 >60 >60 >60  GFRAA >60 >60 >60 >60    LIVER FUNCTION TESTS: Recent Labs    04/05/19 0000 09/22/19 0406 10/02/19 0320  BILITOT <0.2 0.6  --   AST 20 12*  --   ALT 12 6  --  ALKPHOS 98 48  --   PROT 7.4 5.6*  --   ALBUMIN 3.2* 2.1* 1.6*    TUMOR MARKERS: No results for  input(s): AFPTM, CEA, CA199, CHROMGRNA in the last 8760 hours.  Assessment and Plan: Dysphagia Patient with history of prior difficult gastrostomy tube placement in September 2017.  The tube quickly became dislodged back in 2017 and tube was removed and not replaced.  Patient was initially able to advance diet at home, however with recent progression of Parkinson's and dementia she has had increased difficulty swallowing as well as poor airway protection.  She is now admitted with aspiration pneumonia.  WBC improved.  Afebrile.  She has completed antibiotic regimen.   Plan for possible placement Wednesday with Dr. Archer AsaMcCullough as schedule allows.   Discussed at length with Dr. Matthias HughsBuccini, Dr. Archer AsaMcCullough and sister, [name] today.   Ultimately, [name] has agreed to proceed with attempted placement in IR.   Discussed lovenox dosing with Dr. Lowella DandyHenn. Hold lovenox after tomorrow AM dose.   Risks and benefits image guided gastrostomy tube placement was discussed with the patient including, but not limited to the need for a barium enema during the procedure, bleeding, infection, peritonitis and/or damage to adjacent structures.  All of the patient's questions were answered, patient is agreeable to proceed.  Consent signed and in chart.  Thank you for this interesting consult.  I greatly enjoyed meeting [place] and look forward to participating in their care.  A copy of this report was sent to the requesting provider on this date.  Electronically Signed: Hoyt KochKacie Sue-Ellen Cherelle Midkiff, PA 10/02/2019, 5:57 PM   I spent a total of 40 Minutes    in face to face in clinical consultation, greater than 50% of which was counseling/coordinating care for dysphagia, aspiration pneumonia.

## 2019-10-02 NOTE — Progress Notes (Signed)
Internal Medicine Attending Note:  I have seen and evaluated this patient and I have discussed the plan of care with the house staff. Please see their note for complete details. I concur with their findings.  Greatly appreciate GI, IR, and surgery assistance in coordinating care for this difficult case. Overall prognosis is poor. Patient is a 64 yo F with a pmhx of Down syndrome and recent diagnosis of Parkinson's disease here with AMS and aspiration PNA. Sister (primary care giver) is adamant on having a PEG tube placed for feeding despite her high risk for perioperative complications including respiratory failure and likely little to no benefit on long term prognosis. She understands that patient will likely continue to aspiration oropharyngeal contents despite being NPO. Despite this, she continues to request aggressive care and stated today that she would like her to receive nutrition through a feeding tube even if it only prolongs her life for weeks or months. Family has been very frustrated with her care and the "delay" of PEG tube placement. After discussion with GI today, the decision was made to have a panda NGT placed for feeding while we coordinate if, and who will place the PEG tube. Palliative care has also been consulted, appreciate assistance with goals of care.    Today is day 5/5 of IV Zosyn for HCAP therapy. Continue IVF and electrolyte replacement per resident note.   Reymundo Poll, MD 10/02/2019, 4:33 PM

## 2019-10-02 NOTE — Progress Notes (Signed)
Asked by primary team to discuss with family PEG vs surgical gastrostomy tube placement. IR was initially consulted for PEG placement and felt there was not a good window. Patient felt to be a poor candidate for open gastrostomy tube placement given need for general anesthesia for this. Given history of previous PEG tube, it is also felt that surgical gastrostomy is likely not needed, as stomach should be adherent to anterior abdominal wall. GI was consulted and reached back out to IR, IR now willing to attempt PEG placement. Family however now requesting surgical gastrostomy tube placement. I called the patient's sister, [name], and explained that I would recommend IR gastrostomy placement over open gastrostomy tube. Patient is a good candidate for IR gastrostomy or GI PEG and surgery for this would be more invasive and higher risk. She would like to discuss this with her husband prior to making any decisions. Will discuss with attending as well, but at the present, my recommendation would be for IR placed gastrostomy tube.  Wells Guiles , [facility] 10/02/2019, 2:47 PM Please see Amion for pager number during day hours 7:00am-4:30pm

## 2019-10-02 NOTE — Consult Note (Signed)
Referring Provider:  Dr. Jessy Oto (Med Joycelyn Das) Primary Care Physician:  Tracey Harries, MD Primary Gastroenterologist: None (unassigned)  Reason for Consultation: Need for nutritional support  HPI: Kaitlyn Powell is a 64 y.o. female with Down syndrome and, over the past several months, a diagnosis of apparently fairly rapid Parkinson's disease, superimposed on dementia, admitted to the hospital about 2 weeks ago with aspiration pneumonia.  At baseline, the patient has obstructive sleep apnea and uses BiPAP during the nighttime.  There has been progressive decline in status recently, with decreased ability to walk.  The history is obtained primarily from the patient's sister, Kaitlyn Powell, who is at the bedside and is very well-informed and very devoted to the patient's care.  The patient has been living at home and under her sister's care for the past 4 years or so.  With that background, the patient is being considered for gastrostomy tube placement.  Since admission, following treatment for her aspiration pneumonia, she "passed" a swallowing study and was on a dysphagia 1 diet, but during this time, it was noted that she would have low-grade fevers and drops in her oxygen saturation, raising the question of possible silent aspiration.  Therefore, toward the end of last week, a decision was made to have a gastrostomy tube placed.  Of note, the patient did have a PEG placed in [place], [place] about 4 years ago when she had an episode of aspiration back then.  The presence of the gastrostomy tube meant that the patient could not return to her group home, so at that time she moved down here to be with her sister and cared for in her sister's home.  Shortly after that move, the gastrostomy tube malfunctioned but since the patient was able to eat, a decision was made to remove it and not replace it.  On this hospitalization, the patient had a CT scan which showed a decompressed stomach but no  obvious window for interventional radiology placement of the G-tube.  The patient was seen by general surgery, who recommended that we be consulted, because of reluctance to put this patient with marginal pulmonary status under general anesthesia.  Meanwhile, the patient has been without food for the past 4 days, which has the patient's sister very concerned.     Past Medical History:  Diagnosis Date  . Arthritis   . Aspiration pneumonia (HCC)   . Asthma   . Cardiac arrhythmia    have pacemeaker  . Down syndrome   . DVT of popliteal vein (HCC) 09/08/2018   chronic  . GERD (gastroesophageal reflux disease)   . Heart block   . Hypothyroidism   . Mental retardation   . Pacemaker   . Pulmonary emboli (HCC)   . Seizures (HCC)   . Sleep apnea with use of continuous positive airway pressure (CPAP)     Past Surgical History:  Procedure Laterality Date  . PACEMAKER INSERTION  12/09/2015   [employer] Accolade MRI EL PPM implanted in [place] for complete heart block and syncope  . PEG TUBE PLACEMENT     removed in 2017  . TIBIA FRACTURE SURGERY Right     Prior to Admission medications   Medication Sig Start Date End Date Taking? Authorizing Provider  Cannabidiol (EPIDIOLEX) 100 MG/ML SOLN Take 3.110mL twice a day Patient taking differently: Take 3.4 mLs by mouth in the morning and at bedtime. Take 3.90mL twice a day (will graduate up to 3.7) 06/14/19  Yes Van Clines, MD  carbidopa-levodopa (SINEMET IR) 25-100 MG tablet Take 1 tablet by mouth 3 (three) times daily. Take 1.5 tablet three times a day with meals Patient taking differently: Take 1.5 tablets by mouth 3 (three) times daily. Take 1.5 tablet three times a day with meals ( times given)  0800, 1300, 1900 08/31/19  Yes Van Clines, MD  citalopram (CELEXA) 20 MG tablet Take 20 mg by mouth daily.   Yes [provider]  cyanocobalamin (TH VITAMIN B12) 100 MCG tablet Take 100 mcg by mouth every morning.    Yes [provider]  folic acid (FOLVITE) 1 MG tablet Take 1 mg  by mouth  daily   Yes [provider]  levETIRAcetam (KEPPRA XR) 500 MG 24 hr tablet Take 1 tab in AM, 2 tabs in PM Patient taking differently: Take 500-1,000 mg by mouth See admin instructions. Take 1 tab (500mg ) in AM, 2 tabs( 1000mg ) in PM 08/31/19  Yes , MD  levothyroxine (SYNTHROID, LEVOTHROID) 150 MCG tablet Take 150 mcg by mouth daily before breakfast.   Yes [provider]  OXYGEN Inhale 3 L into the lungs as directed. With CPAP    Yes [provider]  phenytoin (DILANTIN) 200 MG ER capsule Take 1 capsule (200 mg total) by mouth 2 (two) times daily. 08/31/19  Yes Van Clines, MD  Sennosides (SENNA) 8.8 MG/5ML SYRP TAKE 10 MLS BY MOUTH DAILY. AS NEEDED Patient taking differently: Take 10 mLs by mouth daily as needed (for constipation).  08/09/19  Yes Nandigam, Van Clines, MD  simvastatin (ZOCOR) 40 MG tablet TAKE 40 MG BY MOUTH DAILY AT BEDTIME Patient taking differently: Take 40 mg by mouth at bedtime. Take 40 mg  by mouth daily at bedtime 03/28/19  Yes Allred, Eleonore Chiquito, MD  sodium chloride (MURO 128) 5 % ophthalmic ointment Place 1 application into both eyes at bedtime.   Yes [provider]  XARELTO 20 MG TABS tablet TAKE 1 TABLET (20 MG TOTAL) BY MOUTH DAILY WITH SUPPER. Patient taking differently: Take 20 mg by mouth daily with supper.  07/21/19  Yes AllredFayrene Fearing, MD  Elastic Bandages & Supports (GAIT/TRANSFER BELT) MISC 1 Product by Does not apply route daily. 04/03/19   Fayrene Fearing, MD  furosemide (LASIX) 20 MG tablet Take 1 tablet (20 mg total) by mouth as needed for fluid or edema. Patient not taking: Reported on 09/22/2019 12/28/18   09/24/2019, PA  polyethylene glycol powder (MIRALAX) powder Take 17 g by mouth 3 (three) times daily as needed for mild constipation. Patient not taking: Reported on 09/22/2019 11/01/18   09/24/2019, MD  risperiDONE (RISPERDAL) 1  MG tablet Take 0.5 mg by mouth 2 (two) times daily. 1/2 tab bid    [provider]    Current Facility-Administered Medications  Medication Dose Route Frequency Provider Last Rate Last Admin  . acetaminophen (TYLENOL) suppository 650 mg  650 mg Rectal Q6H PRN 11/03/18, MD      . budesonide (PULMICORT) nebulizer solution 0.25 mg  0.25 mg Nebulization BID Napoleon Form, MD   0.25 mg at 10/02/19 0845  . carbidopa-levodopa (SINEMET IR) 25-100 MG per tablet immediate release 1.5 tablet  1.5 tablet Oral TID WC Anne Shutter, MD   1.5 tablet at 10/02/19 0749  . Chlorhexidine Gluconate Cloth 2 % PADS 6 each  6 each Topical Daily Lanelle Bal, MD   6 each at 10/01/19 [phone]  . dextrose 50 % solution  12.5 g  12.5 g Intravenous PRN Levora Dredge, MD   12.5 g at 10/02/19 0243  . enoxaparin (LOVENOX) injection 80 mg  1 mg/kg Subcutaneous Q12H Anne Shutter, MD   80 mg at 10/02/19 0749  . ipratropium-albuterol (DUONEB) 0.5-2.5 (3) MG/3ML nebulizer solution 3 mL  3 mL Nebulization Q4H PRN Claudean Severance, MD   3 mL at 09/26/19 1158  . ipratropium-albuterol (DUONEB) 0.5-2.5 (3) MG/3ML nebulizer solution 3 mL  3 mL Nebulization QID Anne Shutter, MD   3 mL at 10/02/19 0844  . levETIRAcetam (KEPPRA) IVPB 1000 mg/100 mL premix  1,000 mg Intravenous QHS Katherine Roan, MD   Stopped at 10/01/19 2118  . levETIRAcetam (KEPPRA) IVPB 500 mg/100 mL premix  500 mg Intravenous Daily Katherine Roan, MD 400 mL/hr at 10/02/19 1025 500 mg at 10/02/19 1025  . levothyroxine (SYNTHROID, LEVOTHROID) injection 75 mcg  75 mcg Intravenous Daily Katherine Roan, MD   75 mcg at 10/01/19 [phone]  . phenytoin (DILANTIN) 110 mg in sodium chloride 0.9 % 100 mL IVPB  110 mg Intravenous Q8H Dang, Thuy D, RPH      . potassium chloride 10 mEq in 100 mL IVPB  10 mEq Intravenous Q1 Hr x 6 Dellia Cloud, MD 100 mL/hr at 10/02/19 1058 10 mEq at 10/02/19 1058  . Resource ThickenUp Clear   Oral PRN  Anne Shutter, MD      . sodium chloride (MURO 128) 5 % ophthalmic ointment   Both Eyes QHS Lorenso Courier, MD   Given at 10/01/19 2105  . sodium chloride flush (NS) 0.9 % injection 10-40 mL  10-40 mL Intracatheter Q12H Anne Shutter, MD   10 mL at 10/01/19 2104  . sodium chloride flush (NS) 0.9 % injection 10-40 mL  10-40 mL Intracatheter PRN Anne Shutter, MD        Allergies as of 09/21/2019 - Review Complete 09/21/2019  Allergen Reaction Noted  . Cephalexin Other (See Comments) 06/04/2016  . Namenda [memantine hcl] Other (See Comments) 06/21/2018    Family History  Problem Relation Age of Onset  . Diabetes Father   . Heart disease Father   . Breast cancer Sister     Social History   Socioeconomic History  . Marital status: Single    Spouse name: Not on file  . Number of children: Not on file  . Years of education: Not on file  . Highest education level: Not on file  Occupational History  . Occupation: disabled  Tobacco Use  . Smoking status: Never Smoker  . Smokeless tobacco: Never Used  Substance and Sexual Activity  . Alcohol use: No  . Drug use: No  . Sexual activity: Never  Other Topics Concern  . Not on file  Social History Narrative   Pt is left handed   Lives in 2 story home with her sister, brother-in-law and BIL's brother (pt stays on bottom floor)   Social Determinants of Health   Financial Resource Strain:   . Difficulty of Paying Living Expenses: Not on file  Food Insecurity:   . Worried About Programme researcher, broadcasting/film/video in the Last Year: Not on file  . Ran Out of Food in the Last Year: Not on file  Transportation Needs:   . Lack of Transportation (Medical): Not on file  . Lack of Transportation (Non-Medical): Not on file  Physical Activity:   . Days of Exercise per Week: Not on file  . Minutes of Exercise per  Session: Not on file  Stress:   . Feeling of Stress : Not on file  Social Connections:   . Frequency of Communication with  Friends and Family: Not on file  . Frequency of Social Gatherings with Friends and Family: Not on file  . Attends Religious Services: Not on file  . Active Member of Clubs or Organizations: Not on file  . Attends Banker Meetings: Not on file  . Marital Status: Not on file  Intimate Partner Violence:   . Fear of Current or Ex-Partner: Not on file  . Emotionally Abused: Not on file  . Physically Abused: Not on file  . Sexually Abused: Not on file    Review of Systems: No evident coughing when swallowing, per discussion with the patient's sister  Physical Exam: Vital signs in last 24 hours: Temp:  [97.6 F (36.4 C)-98.5 F (36.9 C)] 97.6 F (36.4 C) (03/01 0800) Pulse Rate:  [65-73] 70 (03/01 0847) Resp:  [16-35] 25 (03/01 0847) BP: (75-119)/(15-55) 119/53 (03/01 0900) SpO2:  [90 %-100 %] 97 % (03/01 0847) Weight:  [84 kg] 84 kg (03/01 0405) Last BM Date: 09/27/19  Well-nourished, noncommunicative Caucasian female lying in bed on nasal oxygen, with oxygen saturations ranging from the 90s down into the high 50s during my time at the bedside.  She is not in any evident respiratory distress and there is no audible wheezing or coughing.  The patient is awake but not verbally communicative.  Anteriorly, the chest is clear with good air movement.  The heart is normal, without tachycardia or murmur.  The abdomen has a well-healed PEG site in the left perigastric area, and is somewhat adipose but without palpable organomegaly (left lobe of liver was overlying the stomach on the CT scan).  The skin is somewhat pale.   Intake/Output from previous day: 02/28 0701 - 03/01 0700 In: -  Out: 1400 [Urine:1400] Intake/Output this shift: No intake/output data recorded.  Lab Results: Recent Labs    09/30/19 0513 10/01/19 0312  WBC 5.5 4.9  HGB 8.5* 7.9*  HCT 26.2* 24.9*  PLT 148* 132*   BMET Recent Labs    09/30/19 0513 10/01/19 0312 10/02/19 0320  NA 144 143 142  K  2.8* 3.5 3.1*  CL 106 108 101  CO2 30 28 30   GLUCOSE 118* 105* 124*  BUN 12 11 8   CREATININE 0.93 0.79 0.79  CALCIUM 8.0* 7.9* 8.1*   LFT Recent Labs    10/02/19 0320  ALBUMIN 1.6*   PT/INR No results for input(s): LABPROT, INR in the last 72 hours.  Studies/Results: DG CHEST PORT 1 VIEW  Result Date: 10/01/2019 CLINICAL DATA:  Hypoxemia. EXAM: PORTABLE CHEST 1 VIEW COMPARISON:  09/27/2019 and prior radiographs FINDINGS: Cardiomegaly, RIGHT-sided pacemaker and pulmonary vascular congestion again noted. Diffuse bilateral airspace opacities are again noted. There is no evidence of pneumothorax. There has been little change since the prior study. IMPRESSION: Unchanged appearance of the chest with diffuse bilateral airspace opacities. Electronically Signed   By: Harmon Pier M.D.   On: 10/01/2019 15:06    Impression: 1.  Down syndrome, Parkinson's disease, dementia, and recent aspiration pneumonia 2.  Possible technical impediments to percutaneous gastrostomy placement (whether by radiology or gastroenterology), and anesthesia impediments to surgical gastrostomy placement  Plan: 1.  Very lengthy discussion with the patient's sister, also later the patient's sister's husband, and the ward team at the bedside. 2.  The patient's sister is unequivocal that she wants the patient  to receive nutrition support, even if it will only extend the patient's life a week or a month or what ever. 3.  Various forms of aspiration were explained.  I explained, specifically, that aspiration of oropharyngeal secretions can occur even after gastrostomy tube is placed, or there could be reflux of gastric contents causing aspiration.  Therefore, a gastrostomy tube, although it would ensure adequate nutrition, would not be a complete safeguard against recurrent aspiration pneumonia. 4.  Risks of anesthesia, whether intravenous or general, were discussed briefly. 5.  I have been in touch with interventional  radiology who will reassess the patient's circumstances to determine whether it might be possible to find a "window" for percutaneous gastrostomy placement, once the stomach is fully insufflated.  I am modestly optimistic that such a window may be present, in view of the fact that the patient has had previous PEG placement so presumably the stomach is adherent to the anterior abdominal wall. 6.  If IR cannot find a "window" despite insufflation of the stomach, then I do not think endoscopic gastrostomy placement would offer any additional advantage or opportunity.  Therefore, if IR cannot find a window, I think we would have to proceed to surgical gastrostomy placement, which would probably require preoperative pulmonary clearance. 7.  Since it will probably take a number of days to get everything sorted out, since the patient has already been several days without nutrition, and since a panda tube could be used to insufflate the stomach for the purposes of IR evaluation, I have recommended to the ward team that a panda be placed (it should be placed into the stomach, not the small bowel, for our purposes).  It can be used for nutrition once IR has offered their opinion, or if there will be a delay in IR re-evaluating the patient.   LOS: 10 days   [place]  10/02/2019, 11:46 AM   Pager [phone] If no answer or after 5 PM call [phone]

## 2019-10-02 NOTE — Progress Notes (Signed)
  Speech Language Pathology Treatment: Dysphagia  Patient Details Name: Kaitlyn Powell MRN: [id number] DOB: 09/13/1955 Today's Date: 10/02/2019 Time: 1040-1055 SLP Time Calculation (min) (ACUTE ONLY): 15 min  Assessment / Plan / Recommendation Clinical Impression  Pt/family were seen for skilled ST intervention, with focus on education with [name], pt's sister. Pt is currently requiring BiPAP, and is NPO for possible PEG placement, so no PO trials were given at this time. [name] was receptive to education regarding hold on PO trials today for possible PEG placement, and encouraged her that having PEG tube does not prohibit po intake in the future. PO intake may transition to oral pleasure or comfort feeds, with PEG feeds providing adequate nutrition/hydration. SLP discussed ongoing risk of aspiration even with PEG placement, and that increased O2 requirements (BiPAP) decrease safety for po intake. [name] reported rapid decline following diagnosis of Parkinson's last year. SLP provided written suggestions for natural thickeners to try at home. Recommend one more session for education and to answer questions that may have arisen.   HPI HPI: 64 y.o with a hx of Down Syndrome, recent dx Parkinson's disease, DVT, heart block with a pacemaker in 2017, hypothyroidism, GERD, seizures, osteoporosis, multiple chronic T-spine fractures, and sleep apnea who presented to the ED with hypoxia and respiratory distress. Pt with increased respiratory effort and SOB, appeared to aspirate when eating soup.  Arrived to ED requiring CPAP and weaned to 10L nasal cannula.  Per notes, pt has hx of aspiration pna, remote hx PEG (removed 2017 after swallowing issues were resolved).  Evaluated by this SLP on 08/02/18, at which time she presented with quite functional swallow with no overt concerns for aspiration - soft diet and thin liquids were recommended.  Her sister reports slight worsening in swallow function the last few months.       SLP Plan  Continue with current plan of care                 Oral Care Recommendations: Oral care BID Follow up Recommendations: Home health SLP SLP Visit Diagnosis: Dysphagia, oropharyngeal phase (R13.12) Plan: Continue with current plan of care       GO              Briena Swingler B. Murvin Natal, [facility], CCC-SLP Speech Language Pathologist Office: [phone] Pager: [phone]  Leigh Aurora 10/02/2019, 11:06 AM

## 2019-10-02 NOTE — Progress Notes (Signed)
MEDICATION RELATED CONSULT NOTE  Pharmacy Consult:  Dilantin + Lovenox Indication:  History of seizures + Chronic DVTs  Allergies  Allergen Reactions  . Cephalexin Other (See Comments)    Seizures   . Namenda [Memantine Hcl] Other (See Comments)    Made memory issues worse, sleepiness, increased confusion    Patient Measurements: Height: 4\' 10"  (147.3 cm) Weight: 185 lb 3 oz (84 kg) IBW/kg (Calculated) : 40.9  Vital Signs: Temp: 97.6 F (36.4 C) (03/01 0800) Temp Source: Axillary (03/01 0800) BP: 119/53 (03/01 0900) Pulse Rate: 70 (03/01 0847)   Intake/Output from previous day: 02/28 0701 - 03/01 0700 In: -  Out: 1400 [Urine:1400]   Intake/Output from this shift: No intake/output data recorded.  Labs: Recent Labs    09/30/19 0513 10/01/19 0312 10/02/19 0320  WBC 5.5 4.9  --   HGB 8.5* 7.9*  --   HCT 26.2* 24.9*  --   PLT 148* 132*  --   CREATININE 0.93 0.79 0.79  MG  --  1.5* 1.4*  PHOS  --   --  3.1  ALBUMIN  --   --  1.6*   Estimated Creatinine Clearance: 66 mL/min (by C-G formula based on SCr of 0.79 mg/dL).  Assessment: 55 YOF with history of chronic RLE DVT Feb 2020 and seizures. Per IM team and patient's family, patient was recommended to be on lifelong anticoagulation given repeat doppler showed persistent DVT. Patient has apparently been stable on phenytoin with no recent seizures so would hesitate to stop phenytoin. Phenytoin interacts with all DOACs (decreased DOAC serum levels) so home rivaroxaban was switched to enoxaparin here. Per IM team, unclear what the long term anticoagulation plan will be as warfarin will likely be difficult for the family (frequent INR checks) and enoxaparin will require prior authorization for long term insurance coverage.  Currently she is unable to take PO due to aspiration, so her phenytoin was converted to IV.  Dilantin level corrects to 27.1 mcg/mL (goal 10-20 mcg/mL), likely due to nutritional status.  Renal function  and CBC stable. No bleeding reported.   Plan:  Reduce Dilantin to 110mg  IV Q8H Monitor for seizure activity, renal function, Dilantin/albumin levels at Css  Continue Lovenox 80mg  SQ Q12H CBC Q72H while on Lovenox  Rema Lievanos D. , PharmD, BCPS, BCCCP 10/02/2019, 11:04 AM

## 2019-10-03 DIAGNOSIS — J9601 Acute respiratory failure with hypoxia: Secondary | ICD-10-CM

## 2019-10-03 DIAGNOSIS — Z7189 Other specified counseling: Secondary | ICD-10-CM

## 2019-10-03 LAB — MAGNESIUM
Magnesium: 1.5 mg/dL — ABNORMAL LOW (ref 1.7–2.4)
Magnesium: 1.8 mg/dL (ref 1.7–2.4)

## 2019-10-03 LAB — PHENYTOIN LEVEL, TOTAL: Phenytoin Lvl: 10.5 ug/mL (ref 10.0–20.0)

## 2019-10-03 LAB — CULTURE, BLOOD (ROUTINE X 2)
Culture: NO GROWTH
Culture: NO GROWTH

## 2019-10-03 LAB — GLUCOSE, CAPILLARY
Glucose-Capillary: 103 mg/dL — ABNORMAL HIGH (ref 70–99)
Glucose-Capillary: 67 mg/dL — ABNORMAL LOW (ref 70–99)
Glucose-Capillary: 70 mg/dL (ref 70–99)
Glucose-Capillary: 89 mg/dL (ref 70–99)
Glucose-Capillary: 82 mg/dL (ref 70–99)
Glucose-Capillary: 91 mg/dL (ref 70–99)
Glucose-Capillary: 94 mg/dL (ref 70–99)

## 2019-10-03 LAB — BASIC METABOLIC PANEL
Anion gap: 7 (ref 5–15)
BUN: 5 mg/dL — ABNORMAL LOW (ref 8–23)
CO2: 30 mmol/L (ref 22–32)
Calcium: 8.1 mg/dL — ABNORMAL LOW (ref 8.9–10.3)
Chloride: 101 mmol/L (ref 98–111)
Creatinine, Ser: 0.69 mg/dL (ref 0.44–1.00)
GFR calc Af Amer: >60 mL/min (ref >60)
GFR calc non Af Amer: >60 mL/min (ref >60)
Glucose, Bld: 106 mg/dL — ABNORMAL HIGH (ref 70–99)
Potassium: 3.4 mmol/L — ABNORMAL LOW (ref 3.5–5.1)
Sodium: 138 mmol/L (ref 135–145)

## 2019-10-03 LAB — PHOSPHORUS
Phosphorus: 2.8 mg/dL (ref 2.5–4.6)
Phosphorus: 3.1 mg/dL (ref 2.5–4.6)

## 2019-10-03 MED ORDER — MAGNESIUM SULFATE 4 GM/100ML IV SOLN
4.0000 g | Freq: Once | INTRAVENOUS | Status: AC
  Administered 2019-10-03: 4 g via INTRAVENOUS
  Filled 2019-10-03: qty 100

## 2019-10-03 MED ORDER — OSMOLITE 1.2 CAL PO LIQD
1000.0000 mL | ORAL | Status: DC
  Administered 2019-10-03 – 2019-10-12 (×8): 1000 mL
  Filled 2019-10-03 (×13): qty 1000

## 2019-10-03 MED ORDER — POTASSIUM CHLORIDE 10 MEQ/100ML IV SOLN
10.0000 meq | INTRAVENOUS | Status: AC
  Administered 2019-10-03 (×2): 10 meq via INTRAVENOUS
  Filled 2019-10-03 (×2): qty 100

## 2019-10-03 MED ORDER — SODIUM CHLORIDE 0.9 % IV BOLUS: 250.0000 mL | Freq: Once | INTRAVENOUS | Status: DC

## 2019-10-03 NOTE — Progress Notes (Signed)
Note from palliative care conference yesterday reviewed.  Cortrak tube successfully placed this morning.  Patient's sister indicates that her oxygen saturations have been holding fairly stable, and the respiratory therapist in the room corroborates this.  Current plan is for attempted percutaneous gastrostomy placement by interventional radiology tomorrow.  If unsuccessful, our team will try to talk with the radiologist to determine whether the impediment to successful placement was something that might be overcome by endoscopic technique.  If so, we could aim for an attempt at [facility] placement (that is, endoscopically) the next day.  Otherwise, the patient would probably need surgical gastrostomy tube placement.  Florencia Reasons, M.D. Pager [phone] If no answer or after 5 PM call [phone]

## 2019-10-03 NOTE — Progress Notes (Signed)
Physical Therapy Treatment Patient Details Name: Kaitlyn Powell MRN: [id number] DOB: [birth year] Today's Date: 10/03/2019    History of Present Illness Pt is a 64 y.o female with PMH of Down Syndrome and Parkinson's disease, admitted 09/21/19 with hypoxia and respiratory distress. Worked up for aspiration PNA. Other PMH includes DVT on Xarelto, heart block (s/p pacemaker 2017), hypothyroidism, seizures, osteoporosis, multiple chronic T-spine fxs, OSA.    PT Comments    Pt on 3LO2 upon arrival to room with desats to 80s after cortrak placement, and required 5LO2 to recover. Pt very pleasant, especially excited when pt's sister present for second half of session. Pt required total assist +2 for multidirectional scooting this session, initial intent of session on transfer training but pt unable to progress from bed<>chair safely with use of scoot pivot at this time. Pt participated well in both static and dynamic sitting balance tasks, with 5 minutes unsupported sitting noted. PT to continue to follow acutely, pt's sister's wishes remain for pt to d/c home with HHPT.    Follow Up Recommendations  Home health PT;Supervision/Assistance - 24 hour     Equipment Recommendations  3in1 (PT);Hospital bed;Other (comment)(hoyer lift)    Recommendations for Other Services       Precautions / Restrictions Precautions Precautions: Fall Precaution Comments: 3-5LO2 via [place], cortrak Restrictions Weight Bearing Restrictions: No    Mobility  Bed Mobility Overal bed mobility: Needs Assistance             General bed mobility comments: pt up in chair upon PT arrival to room, RN requesting pt stay in chair upon PT exit.  Transfers Overall transfer level: Needs assistance Equipment used: 2 person hand held assist Transfers: Lateral/Scoot Transfers          Lateral/Scoot Transfers: +2 physical assistance;+2 safety/equipment;Total assist General transfer comment: Total assist +2 for lateral  scooting towards L with use of bed pads and head-hips relationship, in preparation for transfer from drop arm recliner to chair. Unable to transfer out of recliner to bed for pt safety this session, so bi-lateral scooting and AP scooting performed in recliner.  Ambulation/Gait             General Gait Details: unable   Stairs             Wheelchair Mobility    Modified Rankin (Stroke Patients Only)       Balance Overall balance assessment: Needs assistance Sitting-balance support: Bilateral upper extremity supported Sitting balance-Leahy Scale: Poor Sitting balance - Comments: 5 minutes sitting edge of chair without back support or PT/OT intervention, posterior and L lateral leaning with fatigue requiring mod assist to correct. Challenged pt's dynamic balance by bilateral UE reaching, single LE exercise Postural control: Posterior lean;Left lateral lean     Standing balance comment: unable                            Cognition Arousal/Alertness: Awake/alert Behavior During Therapy: WFL for tasks assessed/performed Overall Cognitive Status: History of cognitive impairments - at baseline                                 General Comments: pt alert and smiling when sister entered room, pt with increased fatigue in session      Exercises General Exercises - Lower Extremity Long Arc Quad: PROM;Both;10 reps;Seated    General Comments General comments (skin integrity, edema,  etc.): SpO2 80% on 3LO2 after cortrack placement, required 5LO2 to recover sats. Multimodal cuing attempted for pt to recover sats, pt unable to follow cuing this session.      Pertinent Vitals/Pain Pain Assessment: Faces Pain Score: 0-No pain Faces Pain Scale: No hurt Pain Location: No pain, but often moving tongue to back of coretrack Pain Intervention(s): Limited activity within patient's tolerance;Monitored during session;Repositioned    Home Living                       Prior Function            PT Goals (current goals can now be found in the care plan section) Acute Rehab PT Goals Patient Stated Goal: per sister, to keep her as mobile as possible  PT Goal Formulation: With patient/family Time For Goal Achievement: 10/06/19 Potential to Achieve Goals: Fair Progress towards PT goals: Progressing toward goals    Frequency    Min 2X/week      PT Plan Current plan remains appropriate    Co-evaluation PT/OT/SLP Co-Evaluation/Treatment: Yes Reason for Co-Treatment: Complexity of the patient's impairments (multi-system involvement);For patient/therapist safety;To address functional/ADL transfers PT goals addressed during session: Mobility/safety with mobility;Balance;Strengthening/ROM OT goals addressed during session: Strengthening/ROM(Nueromuscular Re-ed)      AM-PAC PT "6 Clicks" Mobility   Outcome Measure  Help needed turning from your back to your side while in a flat bed without using bedrails?: Total Help needed moving from lying on your back to sitting on the side of a flat bed without using bedrails?: Total Help needed moving to and from a bed to a chair (including a wheelchair)?: Total Help needed standing up from a chair using your arms (e.g., wheelchair or bedside chair)?: Total Help needed to walk in hospital room?: Total Help needed climbing 3-5 steps with a railing? : Total 6 Click Score: 6    End of Session Equipment Utilized During Treatment: Oxygen Activity Tolerance: Patient tolerated treatment well Patient left: in chair;with call bell/phone within reach;with family/visitor present Nurse Communication: Mobility status PT Visit Diagnosis: Muscle weakness (generalized) (M62.81);Other symptoms and signs involving the nervous system (R29.898)     Time: 9390-3009 PT Time Calculation (min) (ACUTE ONLY): 27 min  Charges:  $Neuromuscular Re-education: 8-22 mins                     Eimy Plaza E, PT Acute  Rehabilitation Services Pager [phone]  Office [phone]   Zephyra Bernardi D Nana Vastine 10/03/2019, 1:56 PM

## 2019-10-03 NOTE — Procedures (Signed)
Cortrak  Person Inserting Tube:  Lyanna Blystone E, RD Tube Type:  Cortrak - 43 inches Tube Location:  Right nare Initial Placement:  Stomach Secured by: Bridle Technique Used to Measure Tube Placement:  Documented cm marking at nare/ corner of mouth Cortrak Secured At:  66 cm    Cortrak Tube Team Note:  Consult received to place a Cortrak feeding tube.   No x-ray is required. RN may begin using tube.    If the tube becomes dislodged please keep the tube and contact the Cortrak team at [email or web address] (password [email or web address]) for replacement.  If after hours and replacement cannot be delayed, place a NG tube and confirm placement with an abdominal x-ray.    Galileah Piggee, MS, RD, LDN RD pager number and weekend/on-call pager number located in Am[employer].   

## 2019-10-03 NOTE — Progress Notes (Signed)
During conversation with [name] (patient's sister) this morning, this RN was notified that she no longer wanted Internal Medicine to be in charge of the case and that she wanted the GI group to be changed as well. Dr. Marchia Bond with Internal Medicine was contacted and informed of the sister's request. After he spoke with her, [name] decided that she was going to speak with her husband before making a final decision of choosing a new group to take charge of the care. This RN was notified this evening by [name] that she is going to continue with Internal Medicine being in charge of the care at this time, Dr. Marchia Bond updated.

## 2019-10-03 NOTE — Progress Notes (Signed)
Paged for hypotension. Repeat BP 65/38. Asymptomatic and at baseline. Will give a 250 cc bolus of NS and ensure patient gets her evening dose of Carbidopa-Levodopa. Repeat BP in 1 hour.   Kaitlyn Dredge, MD

## 2019-10-03 NOTE — Progress Notes (Signed)
MEDICATION RELATED CONSULT NOTE  Pharmacy Consult:  Dilantin Indication:  History of seizures  Allergies  Allergen Reactions  . Cephalexin Other (See Comments)    Seizures   . Namenda [Memantine Hcl] Other (See Comments)    Made memory issues worse, sleepiness, increased confusion    Patient Measurements: Height: 4\' 10"  (147.3 cm) Weight: 185 lb 3 oz (84 kg) IBW/kg (Calculated) : 40.9  Vital Signs: Temp: 98 F (36.7 C) (03/02 0745) Temp Source: Axillary (03/02 0745) BP: 101/54 (03/02 0745) Pulse Rate: 58 (03/02 1136)   Intake/Output from previous day: 03/01 0701 - 03/02 0700 In: 776.6 [I.V.:392.7; IV Piggyback:383.9] Out: 800 [Urine:800]   Intake/Output from this shift: No intake/output data recorded.  Labs: Recent Labs    10/01/19 0312 10/02/19 0320 10/03/19 0523  WBC 4.9  --   --   HGB 7.9*  --   --   HCT 24.9*  --   --   PLT 132*  --   --   CREATININE 0.79 0.79 0.69  MG 1.5* 1.4* 1.5*  PHOS  --  3.1 2.8  ALBUMIN  --  1.6*  --    Estimated Creatinine Clearance: 66 mL/min (by C-G formula based on SCr of 0.69 mg/dL).  Assessment: 50 YOF continues on Keppra and Dilantin for history of seizure.  Dilantin dose was adjusted on 10/02/19 due to a supra-therapeutic level.  Level today is not a steady-state level but indicates that it is trending down.  Renal function is stable.  Hope for nutritional status improvement with tube feeding and possible PEG placement.   Plan:  Continue Dilantin 110mg  IV Q8H Monitor for seizure activity, renal function, Dilantin/albumin levels at Css (~Sat 3/6)  Tiburcio Linder D. 12/02/19, PharmD, BCPS, BCCCP 10/03/2019, 1:23 PM

## 2019-10-03 NOTE — Progress Notes (Signed)
Hypoglycemic Event  CBG: 67  Treatment: 12.5 50% dextrose  Symptoms: none  Follow-up CBG: Time:0359 CBG Result:103  Possible Reasons for Event: no po intake  Comments/MD notified:No    Manson Passey, Boykin Peek

## 2019-10-03 NOTE — Progress Notes (Signed)
Initial Nutrition Assessment  DOCUMENTATION CODES:   Obesity unspecified  INTERVENTION:   Tube Feeding via Cortrak: Osmolite 1.2 at 30 ml/hr, titrate by 10 mL q 8 hours until goal rate of 55 ml/hr Provides 1584 kcals, 73 g of protein and 1069 mL of free water Meets 100% calorie and protein needs   NUTRITION DIAGNOSIS:   Inadequate oral intake related to inability to eat as evidenced by NPO status.  GOAL:   Patient will meet greater than or equal to 90% of their needs  MONITOR:   TF tolerance, Labs, Weight trends  REASON FOR ASSESSMENT:   Consult Assessment of nutrition requirement/status, Enteral/tube feeding initiation and management  ASSESSMENT:   64 yo female admitted with AMS and aspiration pneumonia. PMH includes Down Syndrome with recent diagnosis of Parkinson's Disease. Seizures, hx of PEG tube which has been removed, GERD  2/19 Admitted 3/02 Cortrak placed, TF initiated  NPO since 2/26, pt previous only Dysphagia I, Nectar Thick diet. Recorded po intake 25-100%  Cortrak placed today, TF to be initiated. Noted plan for attempted IR G-tube placement tomorrow  Sister at bedside and reports pt has not had anything to eat in 1 week. Sister is unsure if patient has lost any weight recently but reports she likely has since she has not eaten anything. Prior to 1 week ago, pt with very good appetite and had gained weight  Per weight encounters, weight trend is actually up  Labs: potassium 3.4 (L), magnesium 1.5 (L), phosphorus wdl, Creatinine wdl, CBGs 56-812 Meds: reviewed  Diet Order:   Diet Order            Diet NPO time specified  Diet effective now              EDUCATION NEEDS:   Not appropriate for education at this time  Skin:  Skin Assessment: Reviewed RN Assessment  Last BM:  3/1  Height:   Ht Readings from Last 1 Encounters:  09/22/19 4\' 10"  (1.473 m)    Weight:   Wt Readings from Last 1 Encounters:  10/02/19 84 kg    Ideal Body  Weight:  45.6 kg  BMI:  Body mass index is 38.7 kg/m.  Estimated Nutritional Needs:   Kcal:  1400-1600 kcals  Protein:  75-85 g  Fluid:  >/= 1.5 L   12/02/19 MS, RDN, LDN, CNSC RD Pager Number and Weekend/On-Call After Hours Pager Located in [place]

## 2019-10-03 NOTE — Progress Notes (Signed)
Nursing Leadership rounded on patient and sister on yesterday 10/02/19 to follow up from last week. Sister then had concerns about the inability of medical teams to agree on placing a feeding tube for the patient. The sister acknowledges that all risk and concerns were explained by the pulmonary, surgical, GI, and medical team. She is still not pleased that the teams can not come to one decision. She requested to speak with the attending and this was facilitated by the attending RN. Upon leadership leaving Dr. Marchia Bond was on the department to speak with the patient's sister. Leadership will continue to follow and support.

## 2019-10-03 NOTE — Progress Notes (Signed)
Team instructed to please call [phone] diagnostic radiology for 1 cup of barium to be given via patient's NG tube @ 20:00 on 3.2.21 in preparation for procedure in IR on 3.3.21. Order placed under miscellaneous nursing order.  2nd order for KUB placed for 08:00 on 3.3.21 to evaluate barium pre- procedure. This was communicated directly to bedside RN to communicate during handoff.

## 2019-10-03 NOTE — Progress Notes (Signed)
Subjective: HD#11 Events Overnight: No events overnight  Patient was seen this morning on rounds.  Patient was resting comfortably in her recliner getting a breathing treatment.  Patient tolerating the core track NG tube well.  Patient's family would like tube feeds started as soon as possible are anxious for that began.  Objective:  Vital signs in last 24 hours: Vitals:   10/02/19 1950 10/02/19 2305 10/03/19 0252 10/03/19 0330  BP:  111/61    Pulse: 63 89 63   Resp: 18 18 17    Temp:  98.1 F (36.7 C)  98.1 F (36.7 C)  TempSrc:  Oral  Axillary  SpO2: 100%     Weight:      Height:        Physical Exam: Physical Exam  Constitutional: No distress.  HENT:  Head: Normocephalic.  Eyes: EOM are normal.  Cardiovascular: Normal rate and intact distal pulses.  Pulmonary/Chest: Effort normal. No respiratory distress. She exhibits no tenderness.  Abdominal: Soft. She exhibits no distension. There is no abdominal tenderness.  Musculoskeletal:        General: No edema. Normal range of motion.     Cervical back: Normal range of motion.  Neurological: She is alert.  Skin: Skin is warm and dry. She is not diaphoretic.    Filed Weights   09/29/19 0406 10/01/19 0347 10/02/19 0405  Weight: 80.8 kg 85.6 kg 84 kg     Intake/Output Summary (Last 24 hours) at 10/03/2019 0527 Last data filed at 10/02/2019 1800 Gross per 24 hour  Intake --  Output 800 ml  Net -800 ml    Pertinent labs/Imaging: CBC Latest Ref Rng & Units 10/01/2019 09/30/2019 09/28/2019  WBC 4.0 - 10.5 K/uL 4.9 5.5 6.7  Hemoglobin 12.0 - 15.0 g/dL 7.9(L) 8.5(L) 8.2(L)  Hematocrit 36.0 - 46.0 % 24.9(L) 26.2(L) 25.3(L)  Platelets 150 - 400 K/uL 132(L) 148(L) 129(L)    CMP Latest Ref Rng & Units 10/02/2019 10/01/2019 09/30/2019  Glucose 70 - 99 mg/dL 10/02/2019) 956(O) 130(Q)  BUN 8 - 23 mg/dL 8 11 12   Creatinine 0.44 - 1.00 mg/dL 657(Q 4.69  Sodium 135 - 145 mmol/L 142 143 144  Potassium 3.5 - 5.1 mmol/L 3.1(L) 3.5  2.8(L)  Chloride 98 - 111 mmol/L 101 108 106  CO2 22 - 32 mmol/L 30 28 30   Calcium 8.9 - 10.3 mg/dL 8.1(L) 7.9(L) 8.0(L)  Total Protein 6.5 - 8.1 g/dL - - -  Total Bilirubin 0.3 - 1.2 mg/dL - - -  Alkaline Phos 38 - 126 U/L - - -  AST 15 - 41 U/L - - -  ALT 0 - 44 U/L - - -    No results found.  Assessment/Plan:  Principal Problem:   Aspiration pneumonia (HCC) Active Problems:   Down syndrome   OSA treated with BiPAP   Acquired hypothyroidism   History of pulmonary embolism   Dysphagia   Advanced care planning/counseling discussion   Goals of care, counseling/discussion   Palliative care by specialist    Patient Summary: Brooklee Michelin is a 64 y.o. with pertinent PMH of Down syndrome, Parkinson's disease, hypothyroidism, DVT/PE, heart block with pacemaker, admitted for aspiration pneumonia on hospital day 11  #Aspiration pneumonia - Finished antibiotics. patient does not have any signs or symptoms concerning for infection at this time.  #Malnutrition: #Hypomagensemia: #Hypokalemia: - Cortrak NGT placement today - Tube feeds scheduled to begin at 1:00 - We will continue to replete electrolytes as needed including magnesium and  potassium. - IR plans to place a gastric tube tomorrow patient will need to be n.p.o. at midnight. - We will keep all of her medications IV until gastric tube is places, then switch medications to tube route.  - Monitor electrolytes closely   #Down Syndrome #Parkinson's disease - Continue Carbidopa-levodopa TID with puree  #Seizure disorder  - Continue Keppra, Phenytoin, and restart cannabidiol once NG tube is in place. Call IM team if patient is unable to get this medications for any reason.  #Hypothyroidism - Continue levothyroxine   #Hx of DVT: - Continue enoxaparin  #OSA - Continue BIPAP as needed  Diet: tube feed, NPO at midnight  IVF: none VTE: Enoxaparin  Code: Full  PT/OT recs: pending TOC recs: pending   Dispo:  Anticipated discharge pending clinical improvement.    Marianna Payment, D.O. [facility], PGY-1 Date 10/03/2019 Time 5:27 AM

## 2019-10-03 NOTE — Progress Notes (Signed)
Per Family member: hold on CPT since she is getting tube feed at this time. Pt is in no distress at the time.  Pt on 4L [place] Sp02 97%. Family member agreed to hold off on Bipap until tube feeds are turned off at midnight.  RT will continue to monitor pt.

## 2019-10-03 NOTE — Progress Notes (Signed)
Occupational Therapy Treatment Patient Details Name: Kaitlyn Powell MRN: [id number] DOB: 10-11-1955 Today's Date: 10/03/2019    History of present illness Pt is a 64 y.o female with PMH of Down Syndrome and Parkinson's disease, admitted 09/21/19 with hypoxia and respiratory distress. Worked up for aspiration PNA. Other PMH includes DVT on Xarelto, heart block (s/p pacemaker 2017), hypothyroidism, seizures, osteoporosis, multiple chronic T-spine fxs, OSA.   OT comments  Patient continues to make steady progress towards goals in skilled OT session. Patient's session encompassed cotreat with PT in order to safely address functional deficits. Pt had core track placed upon arrival, and O2 saturation was not bouncing back at 3L; upgraded to 5L with RN present and placing nasal cannula in mouth due to heavy mouth breathing and impeding core track. Pt limited by fatigue in session, as PT and OTA attempted incremental scoot transfers to promote transfers to [facility] with family. Scoots attempted, however due to fatigue, therapist's were unable to proceed. Therapists completed neuromuscular re-ed in order to promote sitting balance and regain strength and endurance; will continue to follow acutely.    Follow Up Recommendations  No OT follow up    Equipment Recommendations  3 in 1 bedside commode;Other (comment)(hoyer lift)    Recommendations for Other Services      Precautions / Restrictions Precautions Precautions: Fall Precaution Comments: 5 O2  Restrictions Weight Bearing Restrictions: No       Mobility Bed Mobility Overal bed mobility: Needs Assistance                Transfers Overall transfer level: Needs assistance Equipment used: 2 person hand held assist             General transfer comment: attempted 2 person incremental scoot from recliner to bed in order to promote increased ability to transfer to [facility], due to vitals and fatigue (and total A of 2 for incremental scoot),  therapy session shifted to neuromuscular re-ed    Balance Overall balance assessment: Needs assistance Sitting-balance support: Bilateral upper extremity supported Sitting balance-Leahy Scale: Poor Sitting balance - Comments: min posterior lean however able to sit unsupported for 5 minutes in session                                   ADL either performed or assessed with clinical judgement   ADL Overall ADL's : Needs assistance/impaired                                       General ADL Comments: Attempted transfer, however total of 2 to shift and pt fatigued due to placement of coretrack. Sister now discussing a life for home     Vision       Perception     Praxis      Cognition Arousal/Alertness: Awake/alert Behavior During Therapy: WFL for tasks assessed/performed Overall Cognitive Status: History of cognitive impairments - at baseline                                 General Comments: pt alert and smiling when sister entered room, pt with increased fatigue in session        Exercises     Shoulder Instructions       General Comments  Pertinent Vitals/ Pain       Pain Assessment: Faces Pain Score: 0-No pain Faces Pain Scale: No hurt Pain Location: No pain, but often moving tongue to back of coretrack  Home Living                                          Prior Functioning/Environment              Frequency  Min 2X/week        Progress Toward Goals  OT Goals(current goals can now be found in the care plan section)  Progress towards OT goals: Progressing toward goals  Acute Rehab OT Goals Patient Stated Goal: per sister, to keep her as mobile as possible  OT Goal Formulation: With family Time For Goal Achievement: 10/07/19 Potential to Achieve Goals: [place] Discharge plan remains appropriate    Co-evaluation    PT/OT/SLP Co-Evaluation/Treatment: Yes Reason for  Co-Treatment: Complexity of the patient's impairments (multi-system involvement);For patient/therapist safety;To address functional/ADL transfers   OT goals addressed during session: Strengthening/ROM(Nueromuscular Re-ed)      AM-PAC OT "6 Clicks" Daily Activity     Outcome Measure   Help from another person eating meals?: Total Help from another person taking care of personal grooming?: Total Help from another person toileting, which includes using toliet, bedpan, or urinal?: Total Help from another person bathing (including washing, rinsing, drying)?: Total Help from another person to put on and taking off regular upper body clothing?: Total Help from another person to put on and taking off regular lower body clothing?: Total 6 Click Score: 6    End of Session Equipment Utilized During Treatment: Oxygen  OT Visit Diagnosis: Unsteadiness on feet (R26.81);Cognitive communication deficit (R41.841)   Activity Tolerance Patient tolerated treatment well;Patient limited by fatigue   Patient Left in chair;with call bell/phone within reach;with nursing/sitter in room;with family/visitor present   Nurse Communication Mobility status        Time: 5188-4166 OT Time Calculation (min): 27 min  Charges: OT General Charges $OT Visit: 1 Visit OT Treatments $Neuromuscular Re-education: 8-22 mins  Corinne Ports E. [place], [place] Acute Rehabilitation Services [place] 10/03/2019, 12:19 PM

## 2019-10-03 NOTE — Plan of Care (Signed)
  Problem: Nutrition: Goal: Adequate nutrition will be maintained Outcome: Progressing   Problem: Activity: Goal: Risk for activity intolerance will decrease Outcome: Progressing   Problem: Elimination: Goal: Will not experience complications related to bowel motility Outcome: Progressing Goal: Will not experience complications related to urinary retention Outcome: Progressing   Problem: Pain Managment: Goal: General experience of comfort will improve Outcome: Progressing   Problem: Safety: Goal: Ability to remain free from injury will improve Outcome: Progressing   Problem: Skin Integrity: Goal: Risk for impaired skin integrity will decrease Outcome: Progressing

## 2019-10-03 NOTE — Progress Notes (Signed)
Internal Medicine Attending Note:  I have seen and evaluated this patient and I have discussed the plan of care with the house staff. Please see their note for complete details. I concur with their findings.  Clinically patient appears stable from yesterday. Remains on 3L [place], seen during chest physiotherapy and appeared comfortable. BP remains soft but otherwise no signs or symptoms of active infection. C[place]rak was placed today and tube feeds have been initiated. Patient will be NPO at midnight with plans for IR attempting placement of PEG tube tomorrow.   Patient's sister remains frustrated with multiple aspects of patient's care. She expressed to her nurse today that she no longer wanted our team to be primary, and that she didn't want residents "running the case". Dr. Marchia Bond and I talked with her this morning about her concerns. It sounds like her primary frustration was the teams switching over on the 1st, and having a new medical team needing to get up to speed with the patient's clinical course. Initially she requested pulmonology to take over as primary. We explained that pulmonology is primarily a consulting service and is only primary in the ICU. She felt this was "ridiculous" but expressed understanding. I offered to request her care be transferred to the hospitalist service, but explain that while there are not residents on their service, their teams usually switch once a week. She said she would discuss this with her husband and get back to [place]. Her other frustrations include the delay in PEG tube placement and initiation of tube feeds. We provided support and validated her frustration, and apologized that the coretrak could not be placed yesterday. We again explained the delay in PEG tube placement being her high risk of perioperative complications including respiratory failure if placed surgical and unfortunately her poor window for IR placement. I am not sure she accepts this explanation, but she  was reassured that the procedure is scheduled for IR to attempt placement tomorrow.    Overall it has been very difficult to have a meaningful goals of care conversation with the family due to the above issues, however from our conversation yesterday I do think she understands that PEG tube placement is unlikely to prolong her life significantly or prevent her from aspirating. Greatly appreciate palliative care's assistance.   Reymundo Poll, MD 10/03/2019, 6:22 PM

## 2019-10-03 NOTE — TOC Progression Note (Addendum)
Transition of Care [facility]) - Progression Note    Patient Details  Name: Kaitlyn Powell MRN: [id number] Date of Birth: 04/24/1956  Transition of Care [facility]) CM/SW Contact  Lawerance Sabal, RN Phone Number: 10/03/2019, 4:18 PM  Clinical Narrative:    Kaitlyn Powell w patient's sister, caregiver, Kaitlyn Powell at bedside.  She confirms that plan for DC will be to return home. Kaitlyn Powell informs that her spouse, Kaitlyn Powell is a Charity fundraiser in the cath lab and will be to assist as well on days that he is not working.   Kaitlyn Powell states that PTA patient transported by transport chair and private car. For DC, patient will need PTAR.  Patient has RW, BiPAP for sleep, and oxygen concentor at home, through [employer]. Kaitlyn Powell would like all equipment ordered through [employer].  Kaitlyn Powell would like hoyer with a split seat sling and a padded 3/1 for home. Informed Kaitlyn Powell that she would need to obtain the upgraded 3/1 at a DME store. She is familiar with the [employer] store on [address]. I will obtain order for hoyer and split seat sling. This can be delivered to the home. Patient has adjustable bed at home, Kaitlyn Powell has declined home hospital bed at this time.  Patient will also need tube feed pump and equipment. MD please place DME Tube feed order.  Patient is active w [facility], and Kaitlyn Powell would like to continue [facility] services with them at DC. Will need resumption orders.   Kaitlyn Powell Sister [phone]    Notified Kaitlyn Powell w [employer] of DME order for hoyer and sling, and made him aware that patient will get PEG tomorrow and will need tube feed supplies and pump, orders pending.      Expected Discharge Plan: Home w Home Health Services Barriers to Discharge: Continued Medical Work up  Expected Discharge Plan and Services Expected Discharge Plan: Home w Home Health Services     Post Acute Care Choice: Home Health Living arrangements for the past 2 months: Single Family Home                           HH Arranged: PT, OT, Speech Therapy HH Agency: [facility] Date HH Agency Contacted: 09/25/19 Time HH Agency Contacted: 1103 Representative spoke with at [facility] Agency: Kaitlyn Powell   Social Determinants of Health (SDOH) Interventions    Readmission Risk Interventions No flowsheet data found.

## 2019-10-04 ENCOUNTER — Inpatient Hospital Stay (HOSPITAL_COMMUNITY): Payer: Medicare Other

## 2019-10-04 HISTORY — PX: IR GASTROSTOMY TUBE MOD SED: IMG625

## 2019-10-04 LAB — GLUCOSE, CAPILLARY
Glucose-Capillary: 68 mg/dL — ABNORMAL LOW (ref 70–99)
Glucose-Capillary: 83 mg/dL (ref 70–99)
Glucose-Capillary: 67 mg/dL — ABNORMAL LOW (ref 70–99)
Glucose-Capillary: 74 mg/dL (ref 70–99)
Glucose-Capillary: 109 mg/dL — ABNORMAL HIGH (ref 70–99)
Glucose-Capillary: 87 mg/dL (ref 70–99)
Glucose-Capillary: 84 mg/dL (ref 70–99)
Glucose-Capillary: 73 mg/dL (ref 70–99)

## 2019-10-04 LAB — COMPREHENSIVE METABOLIC PANEL
ALT: <5 U/L (ref 0–44)
AST: 18 U/L (ref 15–41)
Albumin: 1.6 g/dL — ABNORMAL LOW (ref 3.5–5.0)
Alkaline Phosphatase: 54 U/L (ref 38–126)
Anion gap: 9 (ref 5–15)
BUN: <5 mg/dL — ABNORMAL LOW (ref 8–23)
CO2: 27 mmol/L (ref 22–32)
Calcium: 7.7 mg/dL — ABNORMAL LOW (ref 8.9–10.3)
Chloride: 99 mmol/L (ref 98–111)
Creatinine, Ser: 0.51 mg/dL (ref 0.44–1.00)
GFR calc Af Amer: >60 mL/min (ref >60)
GFR calc non Af Amer: >60 mL/min (ref >60)
Glucose, Bld: 94 mg/dL (ref 70–99)
Potassium: 3.4 mmol/L — ABNORMAL LOW (ref 3.5–5.1)
Sodium: 135 mmol/L (ref 135–145)
Total Bilirubin: 0.3 mg/dL (ref 0.3–1.2)
Total Protein: 5.9 g/dL — ABNORMAL LOW (ref 6.5–8.1)

## 2019-10-04 LAB — CBC
HCT: 23.5 % — ABNORMAL LOW (ref 36.0–46.0)
Hemoglobin: 7.5 g/dL — ABNORMAL LOW (ref 12.0–15.0)
MCH: 33.6 pg (ref 26.0–34.0)
MCHC: 31.9 g/dL (ref 30.0–36.0)
MCV: 105.4 fL — ABNORMAL HIGH (ref 80.0–100.0)
Platelets: 107 10*3/uL — ABNORMAL LOW (ref 150–400)
RBC: 2.23 MIL/uL — ABNORMAL LOW (ref 3.87–5.11)
RDW: 12.7 % (ref 11.5–15.5)
WBC: 3.5 10*3/uL — ABNORMAL LOW (ref 4.0–10.5)
nRBC: 0 % (ref 0.0–0.2)

## 2019-10-04 LAB — PHOSPHORUS
Phosphorus: 2.9 mg/dL (ref 2.5–4.6)
Phosphorus: 3 mg/dL (ref 2.5–4.6)

## 2019-10-04 LAB — MAGNESIUM
Magnesium: 1.9 mg/dL (ref 1.7–2.4)
Magnesium: 1.6 mg/dL — ABNORMAL LOW (ref 1.7–2.4)

## 2019-10-04 MED ORDER — LIDOCAINE HCL 1 % IJ SOLN
INTRAMUSCULAR | Status: AC
  Filled 2019-10-04: qty 20

## 2019-10-04 MED ORDER — FENTANYL CITRATE (PF) 100 MCG/2ML IJ SOLN
INTRAMUSCULAR | Status: AC
  Filled 2019-10-04: qty 2

## 2019-10-04 MED ORDER — VANCOMYCIN HCL IN DEXTROSE 1-5 GM/200ML-% IV SOLN
INTRAVENOUS | Status: AC
  Administered 2019-10-04: 1000 mg via INTRAVENOUS
  Filled 2019-10-04: qty 200

## 2019-10-04 MED ORDER — VANCOMYCIN HCL IN DEXTROSE 1-5 GM/200ML-% IV SOLN
1000.0000 mg | INTRAVENOUS | Status: AC
  Filled 2019-10-04: qty 200

## 2019-10-04 MED ORDER — IOHEXOL 300 MG/ML  SOLN
50.0000 mL | Freq: Once | INTRAMUSCULAR | Status: AC | PRN
  Administered 2019-10-04: 15 mL

## 2019-10-04 MED ORDER — GLUCAGON HCL RDNA (DIAGNOSTIC) 1 MG IJ SOLR
INTRAMUSCULAR | Status: AC
  Filled 2019-10-04: qty 1

## 2019-10-04 MED ORDER — LIDOCAINE HCL (PF) 1 % IJ SOLN
INTRAMUSCULAR | Status: DC | PRN
  Administered 2019-10-04: 5 mL

## 2019-10-04 MED ORDER — GLUCAGON HCL RDNA (DIAGNOSTIC) 1 MG IJ SOLR
INTRAMUSCULAR | Status: DC | PRN
  Administered 2019-10-04: 1 mg via INTRAVENOUS

## 2019-10-04 MED ORDER — MIDAZOLAM HCL 2 MG/2ML IJ SOLN
INTRAMUSCULAR | Status: DC | PRN
  Administered 2019-10-04: 1 mg via INTRAVENOUS

## 2019-10-04 MED ORDER — MIDAZOLAM HCL 2 MG/2ML IJ SOLN
INTRAMUSCULAR | Status: AC
  Filled 2019-10-04: qty 2

## 2019-10-04 NOTE — Progress Notes (Signed)
RN called d/t pt being transported for procedure and requesting bipap to be taken off.  When I entered the room, pt was off bipap already, per RN- pt family member took pt off bipap.  No distress noted currently.

## 2019-10-04 NOTE — Progress Notes (Signed)
Palliative-   Family was accompanying patient to IR for her PEG placement at the time of our scheduled 3pm meeting today. I left a note requesting return call if they would like to reschedule time to meet.   Ocie Bob, AGNP-C Palliative Medicine  Please call Palliative Medicine team phone with any questions [phone]. For individual providers please see AMION.  No charge

## 2019-10-04 NOTE — Progress Notes (Signed)
This RN entered patients room to assess this AM, found the patients brother in law had removed bipap without my knowing. Later this RN entered room again doing hourly rounding to find the patients D10 turned off and the brother in law trying to flush the midline. He stated the IV pump was beeping and her IV wouldn't flush and she needed a new IV. This RN assessed the midline and attempted to flush it, it was sluggish at first then flushed without effort. This RN explained to the family the importance of the D10 being the patients tube feeds have been stopped and she is NPO. The brother in law continued to talk over me. This RN changed midline dressing and restarted D10 at 7ml/hr. Will continue to assess patient to assure all IV's are running and bipap/non re-breather are on as ordered.

## 2019-10-04 NOTE — Progress Notes (Signed)
Came to room for breathing treatment s/p pt having PEG procedure.  Per family in room, hold off on chest vest d/t new PEG tube.  Also, noted sat 85-87% on 3 lpm [place] ([place] in mouth which is the preferred placement -per family in room) pt is mouth breathing.  Placed pt on 45% VM, RN in room and aware.  Sat now improving to 90-91%.  No distress noted currently.  Pt does have upper airway expiratory wheeze sounds which she has had intermittently t/o day.

## 2019-10-04 NOTE — Evaluation (Signed)
G-port removed from low wall suction at - pre order following PEG placement. Will maintain patient NPO status until further changes made to orders.

## 2019-10-04 NOTE — Progress Notes (Signed)
Subjective: HD#12 Events Overnight: an episode of hypotension that improved with fluid bolus of 250 cc.   Patient was seen this afternoon. She was resting comfortable after her successful PEG tube placement. Patient was alert and in no visible distress.  Objective:  Vital signs in last 24 hours: Vitals:   10/04/19 0000 10/04/19 0200 10/04/19 0357 10/04/19 0400  BP: (!) 140/56 123/65  (!) 125/56  Pulse: (!) 55 (!) 57 61 (!) 52  Resp: 20 15 16 17   Temp:      TempSrc:      SpO2: 93% 96%  99%  Weight:      Height:        Physical Exam: Physical Exam  Constitutional: No distress.  HENT:  Head: Normocephalic and atraumatic.  Eyes: EOM are normal.  Cardiovascular: Normal rate and intact distal pulses.  Pulmonary/Chest: Effort normal and breath sounds normal. No respiratory distress. She exhibits no tenderness.  Abdominal: Soft. She exhibits no distension. There is no abdominal tenderness.  PEG tube covered by gauze  Musculoskeletal:     Cervical back: Normal range of motion.  Neurological: She is alert.  Skin: Skin is warm and dry. She is not diaphoretic.    Filed Weights   09/29/19 0406 10/01/19 0347 10/02/19 0405  Weight: 80.8 kg 85.6 kg 84 kg     Intake/Output Summary (Last 24 hours) at 10/04/2019 0636 Last data filed at 10/03/2019 0700 Gross per 24 hour  Intake 776.64 ml  Output --  Net 776.64 ml    Pertinent labs/Imaging: CBC Latest Ref Rng & Units 10/01/2019 09/30/2019 09/28/2019  WBC 4.0 - 10.5 K/uL 4.9 5.5 6.7  Hemoglobin 12.0 - 15.0 g/dL 7.9(L) 8.5(L) 8.2(L)  Hematocrit 36.0 - 46.0 % 24.9(L) 26.2(L) 25.3(L)  Platelets 150 - 400 K/uL 132(L) 148(L) 129(L)    CMP Latest Ref Rng & Units 10/03/2019 10/02/2019 10/01/2019  Glucose 70 - 99 mg/dL 106(H) 124(H) 105(H)  BUN 8 - 23 mg/dL 5(L) 8 11  Creatinine 0.44 - 1.00 mg/dL 0.69 0.79 0.79  Sodium 135 - 145 mmol/L 138 142 143  Potassium 3.5 - 5.1 mmol/L 3.4(L) 3.1(L) 3.5  Chloride 98 - 111 mmol/L 101 101 108  CO2 22 -  32 mmol/L 30 30 28   Calcium 8.9 - 10.3 mg/dL 8.1(L) 8.1(L) 7.9(L)  Total Protein 6.5 - 8.1 g/dL - - -  Total Bilirubin 0.3 - 1.2 mg/dL - - -  Alkaline Phos 38 - 126 U/L - - -  AST 15 - 41 U/L - - -  ALT 0 - 44 U/L - - -    No results found.  Assessment/Plan:  Principal Problem:   Aspiration pneumonia ([place]) Active Problems:   Down syndrome   OSA treated with BiPAP   Acquired hypothyroidism   History of pulmonary embolism   Dysphagia   Advanced care planning/counseling discussion   Goals of care, counseling/discussion   Palliative care by specialist   Acute respiratory failure with hypoxia [facility])    Patient Summary: Kaitlyn Powell is a 64 y.o. with pertinent PMH of Down Syndrome, Parkinson's Disease, hypothyroidism, DVT/PE, Heart block with pacemaker admit for aspiration pneumonia that is since resolved but complicated by decreased p.o. intake on hospital day 12  #Malnutrition Patient is s/p PEG tube placement and doing well. Patient tolerated the procedure well and pain is well controlled. SCD started for DVT ppx. We will start LMWH tomorrow. - Continue NPO - SCD for DVT ppx  #Down Syndrome: #Parkinson: - Continue Carbidopa-levodopa  in puree until Diet advanced tomorrow.  #Seizure Disorder: - Continue IV Keppra and phenytoin.  #Hypothyroidism: - Continue levothyroxine   #Hx of DVT: - SCDs, start enoxaparin tomorrow  #OSA: - BIPAP as needed  - Continue nebulizer     Diet: NPO until tomorrow IVF: none VTE: SCDs Code: full PT/OT recs: pending TOC recs: pending   Dispo: Anticipated discharge pending clinical improvement.    Dellia Cloud, D.O. MCIMTP, PGY-1 Date 10/04/2019 Time 6:36 AM

## 2019-10-04 NOTE — Procedures (Signed)
Interventional Radiology Procedure Note  Procedure: Placement of percutaneous 20F pull-through gastrostomy tube. Complications: None Recommendations: - NPO except for sips and chips remainder of today and overnight - Maintain G-tube to LWS until tomorrow morning  - May advance diet as tolerated and begin using tube tomorrow morning  Signed,  Myriam Brandhorst K. Danyiel Crespin, MD   

## 2019-10-04 NOTE — Progress Notes (Signed)
Came to room for HHN/CPT, Pt currently out of room for procedure.

## 2019-10-04 NOTE — Plan of Care (Signed)

## 2019-10-05 DIAGNOSIS — Z931 Gastrostomy status: Secondary | ICD-10-CM

## 2019-10-05 DIAGNOSIS — E440 Moderate protein-calorie malnutrition: Secondary | ICD-10-CM

## 2019-10-05 LAB — BASIC METABOLIC PANEL
Anion gap: 8 (ref 5–15)
BUN: <5 mg/dL — ABNORMAL LOW (ref 8–23)
CO2: 28 mmol/L (ref 22–32)
Calcium: 7.9 mg/dL — ABNORMAL LOW (ref 8.9–10.3)
Chloride: 99 mmol/L (ref 98–111)
Creatinine, Ser: 0.62 mg/dL (ref 0.44–1.00)
GFR calc Af Amer: >60 mL/min (ref >60)
GFR calc non Af Amer: >60 mL/min (ref >60)
Glucose, Bld: 94 mg/dL (ref 70–99)
Potassium: 3.6 mmol/L (ref 3.5–5.1)
Sodium: 135 mmol/L (ref 135–145)

## 2019-10-05 LAB — CBC
HCT: 26 % — ABNORMAL LOW (ref 36.0–46.0)
Hemoglobin: 8.5 g/dL — ABNORMAL LOW (ref 12.0–15.0)
MCH: 32.9 pg (ref 26.0–34.0)
MCHC: 32.7 g/dL (ref 30.0–36.0)
MCV: 100.8 fL — ABNORMAL HIGH (ref 80.0–100.0)
Platelets: 98 10*3/uL — ABNORMAL LOW (ref 150–400)
RBC: 2.58 MIL/uL — ABNORMAL LOW (ref 3.87–5.11)
RDW: 12.8 % (ref 11.5–15.5)
WBC: 3.6 10*3/uL — ABNORMAL LOW (ref 4.0–10.5)
nRBC: 0.6 % — ABNORMAL HIGH (ref 0.0–0.2)

## 2019-10-05 LAB — MAGNESIUM: Magnesium: 1.5 mg/dL — ABNORMAL LOW (ref 1.7–2.4)

## 2019-10-05 LAB — GLUCOSE, CAPILLARY
Glucose-Capillary: 83 mg/dL (ref 70–99)
Glucose-Capillary: 88 mg/dL (ref 70–99)
Glucose-Capillary: 84 mg/dL (ref 70–99)
Glucose-Capillary: 81 mg/dL (ref 70–99)
Glucose-Capillary: 96 mg/dL (ref 70–99)

## 2019-10-05 LAB — PHOSPHORUS: Phosphorus: 3 mg/dL (ref 2.5–4.6)

## 2019-10-05 MED ORDER — CANNABIDIOL 100 MG/ML PO SOLN
3.7000 mL | Freq: Two times a day (BID) | ORAL | Status: DC
  Administered 2019-10-05 – 2019-10-12 (×14): 370 mg via ORAL
  Filled 2019-10-05 (×16): qty 3.7

## 2019-10-05 MED ORDER — ENOXAPARIN SODIUM 80 MG/0.8ML ~~LOC~~ SOLN
80.0000 mg | Freq: Two times a day (BID) | SUBCUTANEOUS | Status: DC
  Administered 2019-10-05 – 2019-10-09 (×9): 80 mg via SUBCUTANEOUS
  Filled 2019-10-05 (×9): qty 0.8

## 2019-10-05 NOTE — Progress Notes (Signed)
Physical Therapy Treatment Patient Details Name: Kaitlyn Powell MRN: [id number] DOB: 1956-07-09 Today's Date: 10/05/2019    History of Present Illness Pt is a 64 y.o female with PMH of Down Syndrome and Parkinson's disease, admitted 09/21/19 with hypoxia and respiratory distress. Worked up for aspiration PNA. Other PMH includes DVT on Xarelto, heart block (s/p pacemaker 2017), hypothyroidism, seizures, osteoporosis, multiple chronic T-spine fxs, OSA.    PT Comments    Pt drowsy today, and just came off of bipap upon PT/OT arrival to room. Pt required total assist +2 for rolling for pericare and bedding change due to pt being soiled in urine, pt's sister assisting as needed. Pt tolerated ring sit in hospital bed for total of 15 minutes with sats maintained on 4LO2. PT worked on pt head alignment via cervical flexion facilitation and light cervical extensor stretching, positioned with pillows and towel rolls for neutral cervical alignment on PT exit. PT to continue to follow acutely, family remains adamant on pt d/c home with HHPT.    Follow Up Recommendations  Home health PT;Supervision/Assistance - 24 hour     Equipment Recommendations  3in1 (PT);Hospital bed;Other (comment)(hoyer lift)    Recommendations for Other Services       Precautions / Restrictions Precautions Precautions: Fall Precaution Comments: 3-4L 02 via [place] vs bipap (on [place] during session) Restrictions Weight Bearing Restrictions: No    Mobility  Bed Mobility Overal bed mobility: Needs Assistance Bed Mobility: Supine to Sit;Rolling;Sit to Supine Rolling: +2 for physical assistance;Total assist   Supine to sit: +2 for physical assistance;Total assist Sit to supine: +2 for physical assistance;Max assist   General bed mobility comments: rolled to change bed linen and for pericare, used features of hospital bed to place pt upright into ring sitting, once upright returned HOB to flatten with pt able to maintain with min  guard assist, worked on facilitating head at neutral position  Transfers                 General transfer comment: unable  Ambulation/Gait             General Gait Details: unable   Optometrist    Modified Rankin (Stroke Patients Only)       Balance Overall balance assessment: Needs assistance Sitting-balance support: Bilateral upper extremity supported Sitting balance-Leahy Scale: Poor Sitting balance - Comments: ring sitting in bed x15 minutes total                                    Cognition Arousal/Alertness: Awake/alert Behavior During Therapy: Flat affect Overall Cognitive Status: History of cognitive impairments - at baseline                                 General Comments: pt stating, "yeah," x1, drowsy during session      Exercises      General Comments General comments (skin integrity, edema, etc.): 4LO2 via , SpO2 88-94% during mobility with RR 21-29 breaths/min      Pertinent Vitals/Pain Pain Assessment: Faces Faces Pain Scale: No hurt    Home Living                      Prior Function  PT Goals (current goals can now be found in the care plan section) Acute Rehab PT Goals Patient Stated Goal: per sister, to keep her as mobile as possible  PT Goal Formulation: With patient/family Time For Goal Achievement: 10/06/19 Potential to Achieve Goals: Fair Progress towards PT goals: Progressing toward goals    Frequency    Min 2X/week      PT Plan Current plan remains appropriate    Co-evaluation PT/OT/SLP Co-Evaluation/Treatment: Yes Reason for Co-Treatment: For patient/therapist safety PT goals addressed during session: Mobility/safety with mobility;Balance        AM-PAC PT "6 Clicks" Mobility   Outcome Measure  Help needed turning from your back to your side while in a flat bed without using bedrails?: Total Help needed moving from  lying on your back to sitting on the side of a flat bed without using bedrails?: Total Help needed moving to and from a bed to a chair (including a wheelchair)?: Total Help needed standing up from a chair using your arms (e.g., wheelchair or bedside chair)?: Total Help needed to walk in hospital room?: Total Help needed climbing 3-5 steps with a railing? : Total 6 Click Score: 6    End of Session Equipment Utilized During Treatment: Oxygen Activity Tolerance: Patient tolerated treatment well Patient left: with family/visitor present;in bed;with bed alarm set Nurse Communication: Mobility status PT Visit Diagnosis: Muscle weakness (generalized) (M62.81);Other symptoms and signs involving the nervous system (R29.898)     Time: 3382-5053 PT Time Calculation (min) (ACUTE ONLY): 43 min  Charges:  $Therapeutic Activity: 8-22 mins $Neuromuscular Re-education: 8-22 mins                    Alford Gamero E, PT Acute Rehabilitation Services Pager [phone]  Office [phone]  Catharina Pica D Elonda Husky 10/05/2019, 5:22 PM

## 2019-10-05 NOTE — Progress Notes (Signed)
PEG tube placed. Respiratory issues stable.PCCM will sign off please don't hesitate to ask if we can be of further assistance.  Durel Salts, MD Pulmonary and Critical Care Medicine [facility] Pager: [phone] Office:[phone]

## 2019-10-05 NOTE — Progress Notes (Signed)
Nursing leader rounds conducted today. Brother n law at bedside today with no concerns voiced regarding care. Patient was asleep. Brother n law asked to make [place] aware of any concerns that we could assist with. Leadership will follow up POA later today when she returns.

## 2019-10-05 NOTE — Progress Notes (Signed)
Subjective: HD#13 Events Overnight: No events overnight  Patient was seen this morning on rounds.  Patient was resting comfortably in bed.  Patient's guardian denies any new symptoms at this time.  Objective:  Vital signs in last 24 hours: Vitals:   10/05/19 0807 10/05/19 0810 10/05/19 1208 10/05/19 1210  BP:      Pulse:    (!) 58  Resp:    16  Temp:      TempSrc:      SpO2: 92% 94% 97% 97%  Weight:      Height:        Physical Exam: Physical Exam  Constitutional: No distress.  HENT:  Head: Normocephalic and atraumatic.  Eyes: EOM are normal.  Cardiovascular: Normal rate and intact distal pulses.  Pulmonary/Chest: Effort normal. No respiratory distress. She exhibits no tenderness.  Abdominal: Soft. She exhibits no distension. There is no abdominal tenderness.  Musculoskeletal:        General: Edema present. No tenderness.     Cervical back: Normal range of motion.  Neurological: She is alert.  Skin: Skin is warm and dry. She is not diaphoretic.    Filed Weights   09/29/19 0406 10/01/19 0347 10/02/19 0405  Weight: 80.8 kg 85.6 kg 84 kg     Intake/Output Summary (Last 24 hours) at 10/05/2019 1228 Last data filed at 10/05/2019 0800 Gross per 24 hour  Intake 1181.93 ml  Output 1555 ml  Net -373.07 ml    Pertinent labs/Imaging: CBC Latest Ref Rng & Units 10/05/2019 10/04/2019 10/01/2019  WBC 4.0 - 10.5 K/uL 3.6(L) 3.5(L) 4.9  Hemoglobin 12.0 - 15.0 g/dL 4.2(A) 7.5(L) 7.9(L)  Hematocrit 36.0 - 46.0 % 26.0(L) 23.5(L) 24.9(L)  Platelets 150 - 400 K/uL 98(L) 107(L) 132(L)    CMP Latest Ref Rng & Units 10/05/2019 10/04/2019 10/03/2019  Glucose 70 - 99 mg/dL 94 94 834(H)  BUN 8 - 23 mg/dL <9(Q) <2(I) 5(L)  Creatinine 0.44 - 1.00 mg/dL 2.97 9.89 2.11  Sodium 135 - 145 mmol/L 135 135 138  Potassium 3.5 - 5.1 mmol/L 3.6 3.4(L) 3.4(L)  Chloride 98 - 111 mmol/L 99 99 101  CO2 22 - 32 mmol/L 28 27 30   Calcium 8.9 - 10.3 mg/dL 7.9(L) 7.7(L) 8.1(L)  Total Protein 6.5 - 8.1 g/dL -  5.9(L) -  Total Bilirubin 0.3 - 1.2 mg/dL - 0.3 -  Alkaline Phos 38 - 126 U/L - 54 -  AST 15 - 41 U/L - 18 -  ALT 0 - 44 U/L - <5 -      Assessment/Plan:  Principal Problem:   Aspiration pneumonia ([facility]) Active Problems:   Down syndrome   OSA treated with BiPAP   Acquired hypothyroidism   History of pulmonary embolism   Dysphagia   Advanced care planning/counseling discussion   Goals of care, counseling/discussion   Palliative care by specialist   Acute respiratory failure with hypoxia [facility])    Patient Summary: Kaitlyn Powell is a 64 y.o. with pertinent PMH of Down syndrome, Parkinson's disease, hypothyroidism, DVT/PE, heart block with pacemaker admit for aspiration pneumonia that has since resolved now her clinical course has been complicated by decreased p.o. intake with high risk aspiration on hospital day 13  #Malnutrition Patient is s/p PEG tube placement and doing well.  Tube feeds will be started today.  We will switch DVT prophylaxis enoxaparin/heparin.  Start to organize plans for PT and tube feeds once discharged from the hospital.  #Down Syndrome: #Parkinson: - Continue Carbidopa-levodopa in puree  until Diet advanced tomorrow.  #Seizure Disorder: - Continue IV Keppra and phenytoin.  #Hypothyroidism: - Continue levothyroxine   #Hx of DVT: - SCDs, start enoxaparin tomorrow  #OSA: - BIPAP as needed  - Continue nebulizer   Diet: tube feeds IVF: none VTE: SCDs Code: full PT/OT recs: recommended SNF, but family is interested in CIR TOC recs: pending   Dispo: Anticipated discharge 1-2 days.    Marianna Payment, D.O. MCIMTP, PGY-1 Date 10/05/2019 Time 12:28 PM

## 2019-10-05 NOTE — Plan of Care (Signed)

## 2019-10-05 NOTE — Progress Notes (Signed)
Occupational Therapy Treatment Patient Details Name: Kaitlyn Powell MRN: [id number] DOB: 1955/08/05 Today's Date: 10/05/2019    History of present illness Pt is a 64 y.o female with PMH of Down Syndrome and Parkinson's disease, admitted 09/21/19 with hypoxia and respiratory distress. Worked up for aspiration PNA. Other PMH includes DVT on Xarelto, heart block (s/p pacemaker 2017), hypothyroidism, seizures, osteoporosis, multiple chronic T-spine fxs, OSA.   OT comments  Pt seen with her sister, Kaitlyn Powell, participating. Focus of session on sitting balance/core strengthening and facilitating neutral head position. Educated Kaitlyn Powell in positioning towel roll at occiput behind pillow when supine. Pt with Sp02 of 88-94% on 4L 02. Kaitlyn Powell talking very little today, seemed fatigued. Updated goals.  Follow Up Recommendations  No OT follow up    Equipment Recommendations  3 in 1 bedside commode;Hospital bed;Other (comment)(hoyer lift)    Recommendations for Other Services      Precautions / Restrictions Precautions Precautions: Fall Precaution Comments: 3-4L 02 via [place] vs bipap Restrictions Weight Bearing Restrictions: No       Mobility Bed Mobility Overal bed mobility: Needs Assistance Bed Mobility: Supine to Sit;Rolling;Sit to Supine Rolling: +2 for physical assistance;Total assist   Supine to sit: +2 for physical assistance;Total assist Sit to supine: +2 for physical assistance;Max assist   General bed mobility comments: rolled to change bed linen and for pericare, used features of hospital bed to place pt upright into ring sitting, once upright returned HOB to flatten with pt able to maintain with min guard assist, worked on facilitating head at neutral position  Transfers                      Balance Overall balance assessment: Needs assistance Sitting-balance support: Bilateral upper extremity supported Sitting balance-Leahy Scale: Poor Sitting balance - Comments: ring sitting in  bed                                   ADL either performed or assessed with clinical judgement   ADL                                         General ADL Comments: total assist to swab mouth, no functional use of UEs     Vision       Perception     Praxis      Cognition Arousal/Alertness: Awake/alert Behavior During Therapy: Flat affect Overall Cognitive Status: History of cognitive impairments - at baseline                                 General Comments: pt stating, "yeah," x1        Exercises     Shoulder Instructions       General Comments      Pertinent Vitals/ Pain       Pain Assessment: Faces Faces Pain Scale: No hurt  Home Living                                          Prior Functioning/Environment              Frequency  Min 2X/week  Progress Toward Goals  OT Goals(current goals can now be found in the care plan section)  Progress towards OT goals: Progressing toward goals  Acute Rehab OT Goals Patient Stated Goal: per sister, to keep her as mobile as possible  OT Goal Formulation: With family Time For Goal Achievement: 10/12/19 Potential to Achieve Goals: [place] Discharge plan remains appropriate    Co-evaluation    PT/OT/SLP Co-Evaluation/Treatment: Yes Reason for Co-Treatment: For patient/therapist safety          AM-PAC OT "6 Clicks" Daily Activity     Outcome Measure   Help from another person eating meals?: Total Help from another person taking care of personal grooming?: Total Help from another person toileting, which includes using toliet, bedpan, or urinal?: Total Help from another person bathing (including washing, rinsing, drying)?: Total Help from another person to put on and taking off regular upper body clothing?: Total Help from another person to put on and taking off regular lower body clothing?: Total 6 Click Score: 6    End of  Session Equipment Utilized During Treatment: Oxygen(4L)  OT Visit Diagnosis: Unsteadiness on feet (R26.81);Cognitive communication deficit (R41.841)   Activity Tolerance Patient tolerated treatment well   Patient Left in bed;with call bell/phone within reach;with family/visitor present   Nurse Communication Other (comment)(RN removed bipap and placed pt on 4l 02)        Time: 6063-0160 OT Time Calculation (min): 44 min  Charges: OT General Charges $OT Visit: 1 Visit OT Treatments $Therapeutic Activity: 8-22 mins  Nestor Lewandowsky, OTR/L Acute Rehabilitation Services Pager: [phone] Office: [phone]   Malka So 10/05/2019, 3:39 PM

## 2019-10-05 NOTE — Progress Notes (Signed)
Vest therapy held

## 2019-10-05 NOTE — Progress Notes (Signed)
  Speech Language Pathology Treatment: Dysphagia  Patient Details Name: Kaitlyn Powell MRN: [id number] DOB: Sep 11, 1955 Today's Date: 10/05/2019 Time: 0539-7673 SLP Time Calculation (min) (ACUTE ONLY): 8 min  Assessment / Plan / Recommendation Clinical Impression  Spoke with sister to provide further education and discuss plan to attempt to re-initiate po's this admission versus continue NPO and re-introduce at home with home health. Discussed What does she desire? MD input? Pt received PEG yesterday and feedings initiated today around 1300. Would not recommend introducing po's at present on top of initiation of tube feeding and may wish to defer until strength is improved with home health ST (getting prior to admission with new Parkinson dx). Sister stated she would speak with husband. Therapist told sister that ST will not plan to see pt tomorrow unless needed. and she understood/agreed    HPI HPI: 64 y.o with a hx of Down Syndrome, recent dx Parkinson's disease, DVT, heart block with a pacemaker in 2017, hypothyroidism, GERD, seizures, osteoporosis, multiple chronic T-spine fractures, and sleep apnea who presented to the ED with hypoxia and respiratory distress. Pt with increased respiratory effort and SOB, appeared to aspirate when eating soup.  Arrived to ED requiring CPAP and weaned to 10L nasal cannula.  Per notes, pt has hx of aspiration pna, remote hx PEG (removed 2017 after swallowing issues were resolved).  Evaluated by this SLP on 08/02/18, at which time she presented with quite functional swallow with no overt concerns for aspiration - soft diet and thin liquids were recommended.  Her sister reports slight worsening in swallow function the last few months.       SLP Plan  Continue with current plan of care       Recommendations  Diet recommendations: NPO Medication Administration: Crushed with puree                Oral Care Recommendations: Oral care QID Follow up  Recommendations: Home health SLP SLP Visit Diagnosis: Dysphagia, unspecified (R13.10) Plan: Continue with current plan of care       GO                Kaitlyn Powell 10/05/2019, 3:12 PM   Kaitlyn Powell.Ed Nurse, children's [phone] Office [phone]

## 2019-10-05 NOTE — Plan of Care (Signed)
  Problem: Activity: Goal: Risk for activity intolerance will decrease 10/05/2019 0556 by Debbrah Alar, RN Outcome: Progressing 10/05/2019 0555 by Debbrah Alar, RN Outcome: Progressing   Problem: Nutrition: Goal: Adequate nutrition will be maintained 10/05/2019 0556 by Debbrah Alar, RN Outcome: Progressing 10/05/2019 0555 by Debbrah Alar, RN Outcome: Progressing   Problem: Elimination: Goal: Will not experience complications related to bowel motility 10/05/2019 0556 by Debbrah Alar, RN Outcome: Progressing 10/05/2019 0555 by Debbrah Alar, RN Outcome: Progressing Goal: Will not experience complications related to urinary retention 10/05/2019 0556 by Debbrah Alar, RN Outcome: Progressing 10/05/2019 0555 by Debbrah Alar, RN Outcome: Progressing   Problem: Pain Managment: Goal: General experience of comfort will improve 10/05/2019 0556 by Debbrah Alar, RN Outcome: Progressing 10/05/2019 0555 by Debbrah Alar, RN Outcome: Progressing   Problem: Safety: Goal: Ability to remain free from injury will improve 10/05/2019 0556 by Debbrah Alar, RN Outcome: Progressing 10/05/2019 0555 by Velia Meyer D, RN Outcome: Progressing   Problem: Skin Integrity: Goal: Risk for impaired skin integrity will decrease 10/05/2019 0556 by Debbrah Alar, RN Outcome: Progressing 10/05/2019 0555 by Debbrah Alar, RN Outcome: Progressing

## 2019-10-05 NOTE — Progress Notes (Signed)
Thank you for consult on Kaitlyn Powell. Chart reviewed and note that patient with poor activity tolerance and doubt that she would be able to participate in intensive rehab program. Agree with recommendations of home with family per their wishes or SNF for further therapy. Will defer consult for now.

## 2019-10-05 NOTE — Progress Notes (Signed)
Internal Medicine Attending Note:  I have seen and evaluated this patient and I have discussed the plan of care with the house staff. Please see their note for complete details. I concur with their findings.  Will plan for discharge in the next day or two with home health if patient remains stable. She will likely need to go home on oxygen.   Reymundo Poll, MD 10/05/2019, 1:35 PM

## 2019-10-05 NOTE — Progress Notes (Signed)
MEDICATION RELATED CONSULT NOTE  Pharmacy Consult for enoxaparin Indication: hx DVT  Allergies  Allergen Reactions  . Cephalexin Other (See Comments)    Seizures   . Namenda [Memantine Hcl] Other (See Comments)    Made memory issues worse, sleepiness, increased confusion    Patient Measurements: Height: 4\' 10"  (147.3 cm) Weight: 185 lb 3 oz (84 kg) IBW/kg (Calculated) : 40.9  Vital Signs: Temp: 99.2 F (37.3 C) (03/04 1200) Temp Source: Axillary (03/04 1200) BP: 126/76 (03/04 1200) Pulse Rate: 58 (03/04 1210)   Intake/Output from previous day: 03/03 0701 - 03/04 0700 In: 1186.5 [I.V.:889.6; IV Piggyback:296.8] Out: 1555 [Urine:1395; Drains:160]   Intake/Output from this shift: Total I/O In: 419.2 [I.V.:153; Other:20; IV Piggyback:246.3] Out: -   Labs: Recent Labs    10/03/19 0523 10/03/19 1551 10/04/19 0632 10/04/19 2129 10/05/19 0508  WBC  --   --  3.5*  --  3.6*  HGB  --   --  7.5*  --  8.5*  HCT  --   --  23.5*  --  26.0*  PLT  --   --  107*  --  98*  CREATININE 0.69  --  0.51  --  0.62  MG 1.5*   < > 1.9 1.6* 1.5*  PHOS 2.8   < > 2.9 3.0 3.0  ALBUMIN  --   --  1.6*  --   --   PROT  --   --  5.9*  --   --   AST  --   --  18  --   --   ALT  --   --  <5  --   --   ALKPHOS  --   --  54  --   --   BILITOT  --   --  0.3  --   --    < > = values in this interval not displayed.   Estimated Creatinine Clearance: 66 mL/min (by C-G formula based on SCr of 0.62 mg/dL).  Assessment: 64 yo f with hx of chronic RLE DVT Feb 2020 and seizures. Per IM team and patient's family, patient was recommended to be on lifelong anticoagulation given repeat doppler showed persistent DVT. Phenytoin interacts with all DOACs (decreased DOAC serum levels) so home rivaroxaban was switched to enoxaparin here. Per IM team, unclear what the long term anticoagulation plan will be as warfarin will likely be difficult for the family (frequent INR checks) and enoxaparin will require prior  authorization for long term insurance coverage.  Now s/p PEG placement, to restart AC this afternoon   Plan:  Restart lovenox 80mg  SQ q 12h CBC q 72h, s/s bleeding  Mar 2020, PharmD Clinical Pharmacist Please check AMION for all [facility] Pharmacy numbers 10/05/2019 1:00 PM

## 2019-10-05 NOTE — Progress Notes (Signed)
Inpatient Rehabilitation Admissions Coordinator  Alerted RN CM , Lelon Mast that patient is not a candidate for CIR admit.  Ottie Glazier, RN, MSN Rehab Admissions Coordinator [phone] 10/05/2019 2:01 PM

## 2019-10-05 NOTE — Progress Notes (Signed)
(  No charge)  Successful placement of gastrostomy tube by interventional radiology noted.  We will sign off.  Please call us if we can be of assistance in this patient's care.  Florencia Reasons, M.D. Pager [phone] If no answer or after 5 PM call [phone]

## 2019-10-05 NOTE — Progress Notes (Signed)
Referring Physician(s): Dr Smitty Cords  Supervising Physician: Gilmer Mor  Patient Status:  [facility] - In-pt  Chief Complaint:  Dysphagia; aspiration PNA  Subjective:  Percutaneous gastric tube placed in IR 10/04/19 Pt tolerated procedure well Suction overnight  Allergies: Cephalexin and Namenda [memantine hcl]  Medications: Prior to Admission medications   Medication Sig Start Date End Date Taking? Authorizing Provider  Cannabidiol (EPIDIOLEX) 100 MG/ML SOLN Take 3.8mL twice a day Patient taking differently: Take 3.4 mLs by mouth in the morning and at bedtime. Take 3.60mL twice a day (will graduate up to 3.7) 06/14/19  Yes Van Clines, MD  carbidopa-levodopa (SINEMET IR) 25-100 MG tablet Take 1 tablet by mouth 3 (three) times daily. Take 1.5 tablet three times a day with meals Patient taking differently: Take 1.5 tablets by mouth 3 (three) times daily. Take 1.5 tablet three times a day with meals ( times given)  0800, 1300, 1900 08/31/19  Yes Van Clines, MD  citalopram (CELEXA) 20 MG tablet Take 20 mg by mouth daily.   Yes [provider]  cyanocobalamin (TH VITAMIN B12) 100 MCG tablet Take 100 mcg by mouth every morning.    Yes [provider]  folic acid (FOLVITE) 1 MG tablet Take 1 mg  by mouth  daily   Yes [provider]  levETIRAcetam (KEPPRA XR) 500 MG 24 hr tablet Take 1 tab in AM, 2 tabs in PM Patient taking differently: Take 500-1,000 mg by mouth See admin instructions. Take 1 tab (500mg ) in AM, 2 tabs( 1000mg ) in PM 08/31/19  Yes , MD  levothyroxine (SYNTHROID, LEVOTHROID) 150 MCG tablet Take 150 mcg by mouth daily before breakfast.   Yes [provider]  OXYGEN Inhale 3 L into the lungs as directed. With CPAP    Yes [provider]  phenytoin (DILANTIN) 200 MG ER capsule Take 1 capsule (200 mg total) by mouth 2 (two) times daily. 08/31/19  Yes Van Clines, MD  Sennosides (SENNA) 8.8 MG/5ML SYRP TAKE 10  MLS BY MOUTH DAILY. AS NEEDED Patient taking differently: Take 10 mLs by mouth daily as needed (for constipation).  08/09/19  Yes Nandigam, Van Clines, MD  simvastatin (ZOCOR) 40 MG tablet TAKE 40 MG BY MOUTH DAILY AT BEDTIME Patient taking differently: Take 40 mg by mouth at bedtime. Take 40 mg  by mouth daily at bedtime 03/28/19  Yes Allred, Eleonore Chiquito, MD  sodium chloride (MURO 128) 5 % ophthalmic ointment Place 1 application into both eyes at bedtime.   Yes [provider]  XARELTO 20 MG TABS tablet TAKE 1 TABLET (20 MG TOTAL) BY MOUTH DAILY WITH SUPPER. Patient taking differently: Take 20 mg by mouth daily with supper.  07/21/19  Yes AllredFayrene Fearing, MD  Elastic Bandages & Supports (GAIT/TRANSFER BELT) MISC 1 Product by Does not apply route daily. 04/03/19   Fayrene Fearing, MD  furosemide (LASIX) 20 MG tablet Take 1 tablet (20 mg total) by mouth as needed for fluid or edema. Patient not taking: Reported on 09/22/2019 12/28/18   09/24/2019, PA  polyethylene glycol powder (MIRALAX) powder Take 17 g by mouth 3 (three) times daily as needed for mild constipation. Patient not taking: Reported on 09/22/2019 11/01/18   09/24/2019, MD  risperiDONE (RISPERDAL) 1 MG tablet Take 0.5 mg by mouth 2 (two) times daily. 1/2 tab bid    [provider]     Vital Signs: BP 133/79   Pulse 66  Temp 98.9 F (37.2 C) (Axillary)   Resp 19   Ht 4\' 10"  (1.473 m)   Wt 185 lb 3 oz (84 kg)   SpO2 92%   BMI 38.70 kg/m   Physical Exam Vitals reviewed.  Abdominal:     General: Bowel sounds are normal.  Skin:    General: Skin is warm and dry.     Comments: Site is clean and dry NT No bleeding      Imaging: DG Abd 1 View  Result Date: 10/04/2019 CLINICAL DATA:  Evaluate anatomy. EXAM: ABDOMEN - 1 VIEW COMPARISON:  May 11, 2016. FINDINGS: Feeding tube is seen in expected position of distal stomach. Residual contrast is noted throughout the colon. No abnormal bowel dilatation is  noted. IMPRESSION: Feeding tube tip seen in expected position of distal stomach. No abnormal bowel dilatation is noted. Electronically Signed   By: Marijo Conception M.D.   On: 10/04/2019 09:57   IR GASTROSTOMY TUBE MOD SED  Result Date: 10/04/2019 INDICATION: 64 year old female with dysphagia and recurrent aspiration pneumonia. She requires placement of a percutaneous gastrostomy tube for nutrition. EXAM: Fluoroscopically guided placement of percutaneous pull-through gastrostomy tube Interventional Radiologist:  Criselda Peaches, MD MEDICATIONS: 1 g vancomycin; 1 mg glucagon antibiotics were administered within 1 hour of the procedure. ANESTHESIA/SEDATION: Versed 1 mg IV Moderate Sedation Time:  This does not constitute moderate sedation. The patient was continuously monitored during the procedure by the interventional radiology nurse under my direct supervision. CONTRAST:  62mL OMNIPAQUE IOHEXOL 300 MG/ML  SOLN FLUOROSCOPY TIME:  Fluoroscopy Time: 8 minutes 6 seconds (51 mGy). COMPLICATIONS: None immediate. PROCEDURE: Informed written consent was obtained from the patient after a thorough discussion of the procedural risks, benefits and alternatives. All questions were addressed. Maximal Sterile Barrier Technique was utilized including caps, mask, sterile gowns, sterile gloves, sterile drape, hand hygiene and skin antiseptic. A timeout was performed prior to the initiation of the procedure. Maximal barrier sterile technique utilized including caps, mask, sterile gowns, sterile gloves, large sterile drape, hand hygiene, and chlorhexadine skin prep. An angled catheter was advanced over a wire under fluoroscopic guidance through the nose, down the esophagus and into the body of the stomach. The stomach was then insufflated with several 100 ml of air. Fluoroscopy confirmed location of the gastric bubble, as well as inferior displacement of the barium stained colon. The abdomen was also scanned with ultrasound to  ensure that we were inferior to the margin of the left hepatic lobe. Under direct fluoroscopic guidance, a single T-tack was placed, and the anterior gastric wall drawn up against the anterior abdominal wall. Percutaneous access was then obtained into the mid gastric body with an 18 gauge sheath needle. Aspiration of air, and injection of contrast material under fluoroscopy confirmed needle placement. An Amplatz wire was advanced in the gastric body and the access needle exchanged for a 9-French vascular sheath. A snare device was advanced through the vascular sheath and an Amplatz wire advanced through the angled catheter. The Amplatz wire was successfully snared and this was pulled up through the esophagus and out the mouth. A 20-French Alinda Dooms MIC-PEG tube was then connected to the snare and pulled through the mouth, down the esophagus, into the stomach and out to the anterior abdominal wall. Hand injection of contrast material confirmed intragastric location. The T-tack retention suture was then cut. The pull through peg tube was then secured with the external bumper and capped. The patient will be observed for several  hours with the newly placed tube on low wall suction to evaluate for any post procedure complication. The patient tolerated the procedure well, there is no immediate complication. IMPRESSION: Successful placement of a 20 French pull through gastrostomy tube. Electronically Signed   By: Malachy Moan M.D.   On: 10/04/2019 17:55   DG CHEST PORT 1 VIEW  Result Date: 10/01/2019 CLINICAL DATA:  Hypoxemia. EXAM: PORTABLE CHEST 1 VIEW COMPARISON:  09/27/2019 and prior radiographs FINDINGS: Cardiomegaly, RIGHT-sided pacemaker and pulmonary vascular congestion again noted. Diffuse bilateral airspace opacities are again noted. There is no evidence of pneumothorax. There has been little change since the prior study. IMPRESSION: Unchanged appearance of the chest with diffuse bilateral airspace  opacities. Electronically Signed   By: Harmon Pier M.D.   On: 10/01/2019 15:06    Labs:  CBC: Recent Labs    09/30/19 0513 10/01/19 0312 10/04/19 0632 10/05/19 0508  WBC 5.5 4.9 3.5* 3.6*  HGB 8.5* 7.9* 7.5* 8.5*  HCT 26.2* 24.9* 23.5* 26.0*  PLT 148* 132* 107* 98*    COAGS: No results for input(s): INR, APTT in the last 8760 hours.  BMP: Recent Labs    10/02/19 0320 10/03/19 0523 10/04/19 0632 10/05/19 0508  NA 142 138 135 135  K 3.1* 3.4* 3.4* 3.6  CL 101 101 99 99  CO2 30 30 27 28   GLUCOSE 124* 106* 94 94  BUN 8 5* <5* <5*  CALCIUM 8.1* 8.1* 7.7* 7.9*  CREATININE 0.79 0.69 0.51 0.62  GFRNONAA >60 >60 >60 >60  GFRAA >60 >60 >60 >60    LIVER FUNCTION TESTS: Recent Labs    04/05/19 0000 09/22/19 0406 10/02/19 0320 10/04/19 0632  BILITOT <0.2 0.6  --  0.3  AST 20 12*  --  18  ALT 12 6  --  <5  ALKPHOS 98 48  --  54  PROT 7.4 5.6*  --  5.9*  ALBUMIN 3.2* 2.1* 1.6* 1.6*    Assessment and Plan:  May use G tube now  Electronically Signed: June, PA-C 10/05/2019, 8:04 AM   I spent a total of 15 Minutes at the the patient's bedside AND on the patient's hospital floor or unit, greater than 50% of which was counseling/coordinating care for G tube

## 2019-10-06 ENCOUNTER — Inpatient Hospital Stay (HOSPITAL_COMMUNITY): Payer: Medicare Other

## 2019-10-06 LAB — PHOSPHORUS: Phosphorus: 3.6 mg/dL (ref 2.5–4.6)

## 2019-10-06 LAB — BASIC METABOLIC PANEL
Anion gap: 10 (ref 5–15)
BUN: <5 mg/dL — ABNORMAL LOW (ref 8–23)
CO2: 29 mmol/L (ref 22–32)
Calcium: 8.1 mg/dL — ABNORMAL LOW (ref 8.9–10.3)
Chloride: 100 mmol/L (ref 98–111)
Creatinine, Ser: 0.63 mg/dL (ref 0.44–1.00)
GFR calc Af Amer: >60 mL/min (ref >60)
GFR calc non Af Amer: >60 mL/min (ref >60)
Glucose, Bld: 93 mg/dL (ref 70–99)
Potassium: 3.8 mmol/L (ref 3.5–5.1)
Sodium: 139 mmol/L (ref 135–145)

## 2019-10-06 LAB — CBC
HCT: 26.8 % — ABNORMAL LOW (ref 36.0–46.0)
Hemoglobin: 8.9 g/dL — ABNORMAL LOW (ref 12.0–15.0)
MCH: 33.6 pg (ref 26.0–34.0)
MCHC: 33.2 g/dL (ref 30.0–36.0)
MCV: 101.1 fL — ABNORMAL HIGH (ref 80.0–100.0)
Platelets: 97 10*3/uL — ABNORMAL LOW (ref 150–400)
RBC: 2.65 MIL/uL — ABNORMAL LOW (ref 3.87–5.11)
RDW: 13.2 % (ref 11.5–15.5)
WBC: 3.2 10*3/uL — ABNORMAL LOW (ref 4.0–10.5)
nRBC: 0 % (ref 0.0–0.2)

## 2019-10-06 LAB — GLUCOSE, CAPILLARY
Glucose-Capillary: 103 mg/dL — ABNORMAL HIGH (ref 70–99)
Glucose-Capillary: 119 mg/dL — ABNORMAL HIGH (ref 70–99)
Glucose-Capillary: 90 mg/dL (ref 70–99)

## 2019-10-06 LAB — MAGNESIUM: Magnesium: 1.4 mg/dL — ABNORMAL LOW (ref 1.7–2.4)

## 2019-10-06 MED ORDER — MAGNESIUM SULFATE 4 GM/100ML IV SOLN
4.0000 g | Freq: Once | INTRAVENOUS | Status: AC
  Administered 2019-10-06: 4 g via INTRAVENOUS
  Filled 2019-10-06: qty 100

## 2019-10-06 NOTE — TOC Progression Note (Signed)
Transition of Care [facility]) - Progression Note    Patient Details  Name: Deaysia Grigoryan MRN: [id number] Date of Birth: 1956/02/29  [facility] CM/SW [place],  Phone Number: 10/06/2019, 7:04 PM  Clinical Narrative:   CSW received call from SNF after referral sent out with some discrepancies in patient's oxygen on the referral, so CSW went to check on patient. CSW met with patient's sister, [name], at bedside, who was frustrated with the patient's care and discussed some of her concerns with CSW. CSW asked [name] if she had spoken with the unit director, and [name] indicated that she had. CSW provided [facility] with the number for the Office of Patient Experience to provide them with her feedback on her experience. CSW also discussed with [name] the plan moving forward, that the patient's referral has been faxed out and we are waiting on responses. [name] asked about visitation, and CSW indicated to [place] that none of the SNFs are allowing visits at this time, and [name] will not want the patient placed in SNF if she cannot visit. CSW to follow and provide bed offers to sister when they become available.     Expected Discharge Plan: [place] Barriers to Discharge: Continued Medical Work up  Expected Discharge Plan and Services Expected Discharge Plan: [place] Choice: [place] arrangements for the past 2 months: Single Family Home                           HH Arranged: PT, OT, Speech Therapy [place]: [place] ([facility]) Date [place]: 09/25/19 Time [place]: 1103 Representative spoke with at [place]: [place] ([place]) Interventions    Readmission Risk Interventions No flowsheet data found.

## 2019-10-06 NOTE — Progress Notes (Signed)
Internal Medicine Attending Note:  I have seen and evaluated this patient and I have discussed the plan of care with the house staff. Please see their note for complete details. I concur with their findings.  Family is now considering SNF placement. Appreciate PT and SW assistance. She is medically stable for discharge pending dispo decision. Will plan to discharge on oxygen and continued tube feeds.   Reymundo Poll, MD 10/06/2019, 1:36 PM

## 2019-10-06 NOTE — Plan of Care (Signed)

## 2019-10-06 NOTE — TOC Transition Note (Addendum)
Transition of Care [facility]) - CM/SW Discharge Note   Patient Details  Name: Kaitlyn Powell MRN: [id number] Date of Birth: 1955-10-06  [facility] CM/SW Contact:  Cherylann Parr, RN Phone Number: 10/06/2019, 9:15 AM   Clinical Narrative:  CM left VM for sister requesting call back regarding discharge planning to the home.   Per CM DS - DME agency choice provided prior 10/06/19 and [facility] chosen.  [facility] confirms they have referral for hoyer lift.  Per CM DS note family will purchase 3:1 out of pocket.   CM provided tube feed equipment referral to [facility] 10/05/19 - referral given and accepted by Zack.  CM emailed attending group prescription form for tube feed formula required by [facility].    Update 1400:  CM informed by attending that pts sister Kaitlyn Powell is now contemplating SNF for discharge.  CM had lengthy discussion with pts sister.  Pt informed CM that she can not handle ptss current needs at home and since pt can not be accepted at [facility] she would like to explore the SNF options.  CM explained the SNF work up process- sister is in agreement.  CM informed [facility] of potential SNF discharge.  CSW to begin SNF workup.     CM requested PT/OT re-eval as pt can no longer go home with current mobility and complexity      Barriers to Discharge: Continued Medical Work up   Patient Goals and CMS Choice     Choice offered to / list presented to : Sibling(Kaitlyn Powell her sister is the 24 hour caregiver)  Discharge Placement                       Discharge Plan and Services     Post Acute Care Choice: Home Health                    HH Arranged: PT, OT, Speech Therapy HH Agency: [facility] Date HH Agency Contacted: 09/25/19 Time HH Agency Contacted: 1103 Representative spoke with at [facility] Agency: Lupita Leash  Social Determinants of Health (SDOH) Interventions     Readmission Risk Interventions No flowsheet data found.

## 2019-10-06 NOTE — NC FL2 (Signed)
[place] LEVEL OF CARE SCREENING TOOL     IDENTIFICATION  Patient Name: Kaitlyn Powell Birthdate: 05-03-1956 Sex: female Admission Date (Current Location): 09/21/2019  [facility] and [place] Number:  Herbalist and Address:  [facility], [place] [address] [place], [place] [address]      Provider Number: [id number]  Attending Physician Name and Address:  Velna Ochs, MD  Relative Name and Phone Number:       Current Level of Care: Hospital Recommended Level of Care: [place] Prior Approval Number:    Date Approved/Denied:   PASRR Number: Manual review  Discharge Plan: SNF    Current Diagnoses: Patient Active Problem List   Diagnosis Date Noted  . Gastrostomy status ([place])   . Moderate malnutrition ([place])   . Acute respiratory failure with hypoxia ([place])   . Dysphagia   . Advanced care planning/counseling discussion   . Goals of care, counseling/discussion   . Palliative care by specialist   . Cellulitis of left hand 04/04/2019  . Left hand pain 04/04/2019  . Heart block AV complete ([place]) 01/19/2019  . Shortness of breath 01/19/2019  . Chronic deep vein thrombosis (DVT) of right popliteal vein ([place]) 09/28/2018  . Localization-related (focal) (partial) symptomatic epilepsy and epileptic syndromes with complex partial seizures, intractable, with status epilepticus ([place]) 09/07/2018  . Aspiration pneumonia ([place]) 08/01/2018  . Dehydration 08/01/2018  . Toxic metabolic encephalopathy 95/62/1308  . Functional urinary incontinence 07/22/2017  . OSA treated with BiPAP 12/31/2016  . Mixed conductive and sensorineural hearing loss of both ears 08/05/2016  . Seizures ([place]) 07/03/2016  . Bilateral hearing loss 07/03/2016  . Down syndrome 05/10/2016  . Anemia due to medication 05/10/2016  . Acquired hypothyroidism 05/10/2016  . Elevated lipase 05/10/2016  . History of pulmonary embolism 05/10/2016  . G tube  feedings ([place]) 05/10/2016  . Pacemaker 05/10/2016  . S/P percutaneous endoscopic gastrostomy (PEG) tube placement ([place]) 05/10/2016    Orientation RESPIRATION BLADDER Height & Weight     Self(appropiate for developmental age)  Normal Incontinent Weight: 185 lb 3 oz (84 kg) Height:  4\' 10"  (147.3 cm)  BEHAVIORAL SYMPTOMS/MOOD NEUROLOGICAL BOWEL NUTRITION STATUS      Incontinent Feeding tube  AMBULATORY STATUS COMMUNICATION OF NEEDS Skin   Extensive Assist Verbally Normal                       Personal Care Assistance Level of Assistance  Bathing, Feeding, Dressing Bathing Assistance: Maximum assistance Feeding assistance: Maximum assistance Dressing Assistance: Maximum assistance     Functional Limitations Info  Speech     Speech Info: Impaired    SPECIAL CARE FACTORS FREQUENCY  PT (By licensed PT), OT (By licensed OT)     PT Frequency: 5x a week OT Frequency: 5x a week            Contractures Contractures Info: Not present    Additional Factors Info  Code Status, Allergies Code Status Info: full Allergies Info: cephalexin, namenda           Current Medications (10/06/2019):  This is the current hospital active medication list Current Facility-Administered Medications  Medication Dose Route Frequency Provider Last Rate Last Admin  . acetaminophen (TYLENOL) suppository 650 mg  650 mg Rectal Q6H PRN Jeanmarie Hubert, MD      . budesonide (PULMICORT) nebulizer solution 0.25 mg  0.25 mg Nebulization BID Oda Kilts, MD   0.25 mg at  10/06/19 0827  . Cannabidiol SOLN 370 mg  3.7 mL Oral BID Yvette Rack, MD   370 mg at 10/06/19 0923  . carbidopa-levodopa (SINEMET IR) 25-100 MG per tablet immediate release 1.5 tablet  1.5 tablet Oral TID WC Lanelle Bal, MD   1.5 tablet at 10/06/19 1224  . Chlorhexidine Gluconate Cloth 2 % PADS 6 each  6 each Topical Daily Anne Shutter, MD   6 each at 10/06/19 [phone]  . dextrose 10 % infusion   Intravenous  Continuous Dellia Cloud, MD 25 mL/hr at 10/06/19 0617 New Bag at 10/06/19 0617  . dextrose 50 % solution 12.5 g  12.5 g Intravenous PRN Levora Dredge, MD   12.5 g at 10/03/19 0345  . enoxaparin (LOVENOX) injection 80 mg  80 mg Subcutaneous Q12H Daylene Posey, RPH   80 mg at 10/06/19 0920  . feeding supplement (OSMOLITE 1.2 CAL) liquid 1,000 mL  1,000 mL Per Tube Continuous Dellia Cloud, MD 40 mL/hr at 10/05/19 1645 1,000 mL at 10/05/19 1645  . glucagon (human recombinant) (GLUCAGEN) injection    PRN Malachy Moan, MD   1 mg at 10/04/19 1612  . ipratropium-albuterol (DUONEB) 0.5-2.5 (3) MG/3ML nebulizer solution 3 mL  3 mL Nebulization Q4H PRN Claudean Severance, MD   3 mL at 09/26/19 1158  . ipratropium-albuterol (DUONEB) 0.5-2.5 (3) MG/3ML nebulizer solution 3 mL  3 mL Nebulization QID Anne Shutter, MD   3 mL at 10/06/19 1133  . levETIRAcetam (KEPPRA) IVPB 1000 mg/100 mL premix  1,000 mg Intravenous QHS Katherine Roan, MD   Stopped at 10/05/19 2119  . levETIRAcetam (KEPPRA) IVPB 500 mg/100 mL premix  500 mg Intravenous Daily Katherine Roan, MD 400 mL/hr at 10/06/19 1049 500 mg at 10/06/19 1049  . levothyroxine (SYNTHROID, LEVOTHROID) injection 75 mcg  75 mcg Intravenous Daily Katherine Roan, MD   75 mcg at 10/06/19 0617  . lidocaine (PF) (XYLOCAINE) 1 % injection    PRN Malachy Moan, MD   5 mL at 10/04/19 1605  . midazolam (VERSED) injection   Intravenous PRN Malachy Moan, MD   1 mg at 10/04/19 1614  . phenytoin (DILANTIN) 110 mg in sodium chloride 0.9 % 100 mL IVPB  110 mg Intravenous Q8H Dang, Thuy D, RPH 204.4 mL/hr at 10/06/19 0918 110 mg at 10/06/19 0918  . Resource ThickenUp Clear   Oral PRN Anne Shutter, MD      . sodium chloride (MURO 128) 5 % ophthalmic solution 1 drop  1 drop Both Eyes QHS Anne Shutter, MD   1 drop at 10/05/19 2103  . sodium chloride flush (NS) 0.9 % injection 10-40 mL  10-40 mL Intracatheter Q12H Anne Shutter, MD   10  mL at 10/06/19 5993  . sodium chloride flush (NS) 0.9 % injection 10-40 mL  10-40 mL Intracatheter PRN Anne Shutter, MD         Discharge Medications: Please see discharge summary for a list of discharge medications.  Relevant Imaging Results:  Relevant Lab Results:   Additional Information ssn [id number]  Jimmy Picket, [place]

## 2019-10-06 NOTE — Progress Notes (Signed)
Subjective: HD#14 Events Overnight: Tube feeds initiated yesterday and patient tolerating it well.  She still remains sleepy.    Patient was seen this morning on rounds.  Patient resting comfortably in bed.  She was a little bit more alert today but did not participate with the interview.   Objective:  Vital signs in last 24 hours: Vitals:   10/06/19 0815 10/06/19 0827 10/06/19 0834 10/06/19 1134  BP: (!) 107/50     Pulse: 70   70  Resp: (!) 24   (!) 25  Temp: 97.6 F (36.4 C)     TempSrc: Oral     SpO2: (!) 88% 95% 95% 94%  Weight:      Height:       Supplemental O2: 4L [place]  Physical Exam: Physical Exam  Constitutional: No distress.  HENT:  Head: Atraumatic.  Eyes: EOM are normal.  Cardiovascular: Normal rate and intact distal pulses.  Pulmonary/Chest: Effort normal. No respiratory distress. She exhibits no tenderness.  Abdominal: She exhibits no distension. There is no abdominal tenderness.  Musculoskeletal:        General: No tenderness or edema.  Neurological: She is alert.  Skin: Skin is warm and dry. She is not diaphoretic.    Filed Weights   09/29/19 0406 10/01/19 0347 10/02/19 0405  Weight: 80.8 kg 85.6 kg 84 kg     Intake/Output Summary (Last 24 hours) at 10/06/2019 1251 Last data filed at 10/06/2019 0700 Gross per 24 hour  Intake 641.06 ml  Output 900 ml  Net -258.94 ml    Risk Score:  None  Pertinent labs/Imaging: CBC Latest Ref Rng & Units 10/06/2019 10/05/2019 10/04/2019  WBC 4.0 - 10.5 K/uL 3.2(L) 3.6(L) 3.5(L)  Hemoglobin 12.0 - 15.0 g/dL 8.9(L) 8.5(L) 7.5(L)  Hematocrit 36.0 - 46.0 % 26.8(L) 26.0(L) 23.5(L)  Platelets 150 - 400 K/uL 97(L) 98(L) 107(L)    CMP Latest Ref Rng & Units 10/06/2019 10/05/2019 10/04/2019  Glucose 70 - 99 mg/dL 93 94 94  BUN 8 - 23 mg/dL <5(L) <5(L) <5(L)  Creatinine 0.44 - 1.00 mg/dL 0.63 0.62 0.51  Sodium 135 - 145 mmol/L 139 135 135  Potassium 3.5 - 5.1 mmol/L 3.8 3.6 3.4(L)  Chloride 98 - 111 mmol/L 100 99 99  CO2 22  - 32 mmol/L 29 28 27   Calcium 8.9 - 10.3 mg/dL 8.1(L) 7.9(L) 7.7(L)  Total Protein 6.5 - 8.1 g/dL - - 5.9(L)  Total Bilirubin 0.3 - 1.2 mg/dL - - 0.3  Alkaline Phos 38 - 126 U/L - - 54  AST 15 - 41 U/L - - 18  ALT 0 - 44 U/L - - <5    No results found.    Assessment/Plan:  Principal Problem:   Aspiration pneumonia ([place]) Active Problems:   Down syndrome   OSA treated with BiPAP   Acquired hypothyroidism   History of pulmonary embolism   Dysphagia   Advanced care planning/counseling discussion   Goals of care, counseling/discussion   Palliative care by specialist   Acute respiratory failure with hypoxia ([place])   Gastrostomy status ([place])   Moderate malnutrition ([place])    Patient Summary: Sita Mangen is a 64 y.o. with pertinent PMH of syndrome, Parkinson's disease, hypothyroidism, DVT PE/PE, heart block with pacemaker admit for aspiration pneumonia complicated by decreased p.o. intake on hospital day 14  #Malnutrition #Deconditioning  Patient is status post PEG placement.  She is still not at baseline but is more alert today.  I informed the patient's family  of PTs recommendations of home health PT.  I informed him that PT is stated that she is not a candidate for CRI at this time.  He stated that they want to consider subacute rehab at a SNF facility as they will have a difficult time caring for her needs at home. -Continue tube feeds -Pending family decision on subacute PT at SNF  #Aspiration pneumonia: Patient's aspiration pneumonia has clinically resolved on antibiotic therapy.  Patient's family would like a repeat x-ray and to talk to pulmonology today. -CXR  #Down Syndrome: #Parkinson: - Continue Carbidopa-levodopa in puree until Diet advanced tomorrow.  #Seizure Disorder: - Continue IV Keppra and phenytoin.  #Hypothyroidism: - Continue levothyroxine  #Hx of DVT: -Patient is back on therapeutic Lovenox. I spoke to pharmacy who recommended switching the  patient to edoxaban 60 mg daily for anticoagulation in setting of recurrent DVTs.  This medication will not have less interactions with her phenytoin.  #OSA: -Continue BiPAP as needed. -Continue nebulizers.  Diet: Tube Feeds IVF: None,None VTE: Enoxaparin Code: Full PT/OT recs: Home Health TOC recs: discharge home with home health, patient's family would like to consider SNF   Dispo: Anticipated discharge 1-2 days.    Dellia Cloud, D.O. MCIMTP, PGY-1 Date 10/06/2019 Time 12:51 PM

## 2019-10-06 NOTE — Progress Notes (Signed)
RE: Kaitlyn Powell DOB: Oct 14, 2055  Please be advised that the above-named patient will require a short-term nursing home stay-anticipated 30 days or less for rehabilitation and strengthening. Plan is for return home.

## 2019-10-06 NOTE — Progress Notes (Signed)
D/C Recommendation Update  Per case management note, "Pt informed CM that she can not handle ptss current needs at home and since pt can not be accepted at [facility] she would like to explore the SNF options". PT agrees with SNF plan, PT updated recommendations to reflect this. PT to continue to follow acutely.   Richrd Sox, PT Acute Rehabilitation Services Pager [phone]  Office [phone]

## 2019-10-07 LAB — BASIC METABOLIC PANEL
Anion gap: 9 (ref 5–15)
BUN: 7 mg/dL — ABNORMAL LOW (ref 8–23)
CO2: 32 mmol/L (ref 22–32)
Calcium: 8.4 mg/dL — ABNORMAL LOW (ref 8.9–10.3)
Chloride: 97 mmol/L — ABNORMAL LOW (ref 98–111)
Creatinine, Ser: 0.71 mg/dL (ref 0.44–1.00)
GFR calc Af Amer: >60 mL/min (ref >60)
GFR calc non Af Amer: >60 mL/min (ref >60)
Glucose, Bld: 90 mg/dL (ref 70–99)
Potassium: 4.4 mmol/L (ref 3.5–5.1)
Sodium: 138 mmol/L (ref 135–145)

## 2019-10-07 LAB — MAGNESIUM: Magnesium: 2 mg/dL (ref 1.7–2.4)

## 2019-10-07 LAB — PHOSPHORUS: Phosphorus: 3.8 mg/dL (ref 2.5–4.6)

## 2019-10-07 NOTE — Progress Notes (Signed)
Subjective: HD#15 Events Overnight: no events overnight.   Patient was seen this morning on rounds. Patient resting comfortable in bed.  Patient is still pretty sleepy but alert.  Objective:  Vital signs in last 24 hours: Vitals:   10/07/19 1137 10/07/19 1155 10/07/19 1533 10/07/19 1610  BP:  (!) 132/55  (!) 142/69  Pulse:  75  72  Resp:  (!) 24  20  Temp:  98 F (36.7 C)  98.4 F (36.9 C)  TempSrc:  Oral  Oral  SpO2: 96% 93% 98% 100%  Weight:      Height:       Supplemental O2: 4 L  Physical Exam: Physical Exam  Constitutional: No distress.  HENT:  Head: Atraumatic.  Eyes: EOM are normal.  Cardiovascular: Normal rate and intact distal pulses.  Pulmonary/Chest: Effort normal. No respiratory distress. She has rales.  Abdominal: She exhibits no distension. There is no abdominal tenderness.  Musculoskeletal:     Cervical back: Normal range of motion.  Neurological: She is alert.  Skin: Skin is warm and dry. She is not diaphoretic.    Filed Weights   10/01/19 0347 10/02/19 0405 10/07/19 0411  Weight: 85.6 kg 84 kg 84.6 kg     Intake/Output Summary (Last 24 hours) at 10/07/2019 1636 Last data filed at 10/07/2019 0910 Gross per 24 hour  Intake 500 ml  Output 550 ml  Net -50 ml    Risk Score:  n/a  Pertinent labs/Imaging: CBC Latest Ref Rng & Units 10/06/2019 10/05/2019 10/04/2019  WBC 4.0 - 10.5 K/uL 3.2(L) 3.6(L) 3.5(L)  Hemoglobin 12.0 - 15.0 g/dL 8.9(L) 8.5(L) 7.5(L)  Hematocrit 36.0 - 46.0 % 26.8(L) 26.0(L) 23.5(L)  Platelets 150 - 400 K/uL 97(L) 98(L) 107(L)    CMP Latest Ref Rng & Units 10/06/2019 10/05/2019 10/04/2019  Glucose 70 - 99 mg/dL 93 94 94  BUN 8 - 23 mg/dL <5(L) <5(L) <5(L)  Creatinine 0.44 - 1.00 mg/dL 0.63 0.62 0.51  Sodium 135 - 145 mmol/L 139 135 135  Potassium 3.5 - 5.1 mmol/L 3.8 3.6 3.4(L)  Chloride 98 - 111 mmol/L 100 99 99  CO2 22 - 32 mmol/L 29 28 27   Calcium 8.9 - 10.3 mg/dL 8.1(L) 7.9(L) 7.7(L)  Total Protein 6.5 - 8.1 g/dL - -  5.9(L)  Total Bilirubin 0.3 - 1.2 mg/dL - - 0.3  Alkaline Phos 38 - 126 U/L - - 54  AST 15 - 41 U/L - - 18  ALT 0 - 44 U/L - - <5    No results found.    Assessment/Plan:  Principal Problem:   Aspiration pneumonia ([place]) Active Problems:   Down syndrome   OSA treated with BiPAP   Acquired hypothyroidism   History of pulmonary embolism   Dysphagia   Advanced care planning/counseling discussion   Goals of care, counseling/discussion   Palliative care by specialist   Acute respiratory failure with hypoxia ([place])   Gastrostomy status ([place])   Moderate malnutrition ([place])    Patient Summary: Kaitlyn Powell is a 64 y.o. with pertinent PMH of Down syndrome, Parkinson's disease, hypothyroidism, DVT/PE, heart block with pacemake and admit for aspiration pneumonia complicated by decreased p.o. intake and delirium on hospital day 15  #Malnutrition #Deconditioning -Continue tube feeds -Check electrolytes and replete as needed  #Down Syndrome: #Parkinson: - Continue carbidopa levodopa  #Seizure Disorder: -Continue Keppra and phenytoin  #Hypothyroidism: - Continue levothyroxine  #Hx of DVT: -Continue therapeutic Lovenox  #OSA: -Continue BiPAP as needed. -Continue nebulizers.  Diet: Tube Feeds IVF: None,None VTE: Enoxaparin Code: Full PT/OT recs: Home Health TOC recs: Discharged home   Dispo: Anticipated discharge 1-2days.    Dellia Cloud, D.O. MCIMTP, PGY-1 Date 10/07/2019 Time 4:36 PM

## 2019-10-08 LAB — CBC
HCT: 29.1 % — ABNORMAL LOW (ref 36.0–46.0)
Hemoglobin: 9.5 g/dL — ABNORMAL LOW (ref 12.0–15.0)
MCH: 33.7 pg (ref 26.0–34.0)
MCHC: 32.6 g/dL (ref 30.0–36.0)
MCV: 103.2 fL — ABNORMAL HIGH (ref 80.0–100.0)
Platelets: 121 10*3/uL — ABNORMAL LOW (ref 150–400)
RBC: 2.82 MIL/uL — ABNORMAL LOW (ref 3.87–5.11)
RDW: 13.7 % (ref 11.5–15.5)
WBC: 3.7 10*3/uL — ABNORMAL LOW (ref 4.0–10.5)
nRBC: 0 % (ref 0.0–0.2)

## 2019-10-08 LAB — PHENYTOIN LEVEL, TOTAL: Phenytoin Lvl: 8.1 ug/mL — ABNORMAL LOW (ref 10.0–20.0)

## 2019-10-08 LAB — PHOSPHORUS: Phosphorus: 4 mg/dL (ref 2.5–4.6)

## 2019-10-08 LAB — MAGNESIUM: Magnesium: 1.9 mg/dL (ref 1.7–2.4)

## 2019-10-08 MED ORDER — PHENYTOIN SODIUM 50 MG/ML IJ SOLN
100.0000 mg | Freq: Three times a day (TID) | INTRAMUSCULAR | Status: DC
  Administered 2019-10-08 – 2019-10-12 (×12): 100 mg via INTRAVENOUS
  Filled 2019-10-08 (×15): qty 2

## 2019-10-08 MED ORDER — IPRATROPIUM-ALBUTEROL 0.5-2.5 (3) MG/3ML IN SOLN
3.0000 mL | Freq: Four times a day (QID) | RESPIRATORY_TRACT | Status: DC
  Administered 2019-10-08 – 2019-10-12 (×16): 3 mL via RESPIRATORY_TRACT
  Filled 2019-10-08 (×16): qty 3

## 2019-10-08 MED ORDER — IPRATROPIUM-ALBUTEROL 0.5-2.5 (3) MG/3ML IN SOLN: 3.0000 mL | RESPIRATORY_TRACT | Status: DC

## 2019-10-08 NOTE — Progress Notes (Signed)
Internal Medicine Attending Note:  I have seen and evaluated this patient and I have discussed the plan of care with the house staff. Please see their note for complete details. I concur with their findings.  Reymundo Poll, MD 10/08/2019, 5:21 PM

## 2019-10-08 NOTE — Progress Notes (Addendum)
MEDICATION RELATED CONSULT NOTE  Pharmacy Consult:  Dilantin, lovenox Indication:  History of seizures and DVT  Allergies  Allergen Reactions  . Cephalexin Other (See Comments)    Seizures   . Namenda [Memantine Hcl] Other (See Comments)    Made memory issues worse, sleepiness, increased confusion    Patient Measurements: Height: 4\' 10"  (147.3 cm) Weight: 186 lb 8.2 oz (84.6 kg) IBW/kg (Calculated) : 40.9  Vital Signs: Temp: 98.7 F (37.1 C) (03/07 0800) Temp Source: Axillary (03/07 0800) BP: 120/55 (03/07 0800) Pulse Rate: 74 (03/07 0800)   Intake/Output from previous day: 03/06 0701 - 03/07 0700 In: 20 [I.V.:20] Out: 400 [Urine:400]   Intake/Output from this shift: No intake/output data recorded.  Labs: Recent Labs    10/06/19 0537 10/07/19 1633 10/08/19 0153  WBC 3.2*  --  3.7*  HGB 8.9*  --  9.5*  HCT 26.8*  --  29.1*  PLT 97*  --  121*  CREATININE 0.63 0.71  --   MG 1.4* 2.0 1.9  PHOS 3.6 3.8 4.0   Estimated Creatinine Clearance: 66.4 mL/min (by C-G formula based on SCr of 0.71 mg/dL).  Assessment: 66 YOF continues on Keppra and Dilantin for history of seizure. Pharmacy dosing phenytoin. She is currently on IV phenytoin 110mg  IV q8h. Plans noted to discharge home on 3/8 -Phenytoin level= 8.1, albumin= 1.6 (corrected= 19)   She is also on lovenox for hx of chronic RLE DVT Feb 2020. Can not use a DOAC due to concurrent phenytoin and warfarin will be difficult for family. Plans are for long term anticoagulation -Hg= 9.5, plt= 121 (history of low plt)  Plan:  -Change phenytoin to 100mg  IV q8h (if able to tolerate po then oral dosing is the same) -Continue lovenox 80mg  [place] q12h -Will follow patient progress  5/8, PharmD Clinical Pharmacist **Pharmacist phone directory can now be found on [email or web address] ([email or web address]).  Listed under [facility].

## 2019-10-08 NOTE — Progress Notes (Signed)
Subjective: HD#16 Events Overnight: no events overnight.  Patient was seen this morning on rounds.  Patient resting comfortably in bed.  Patient's family states that she was more active yesterday evening but on examination today she continues to be sleepy.  Patient's family brings up concerns of bringing the patient home and make reference to staying in the hospital to the end of next week.  I counseled them on speaking with social work to gain realistic expectations for transitioning out of the hospital.  Objective:  Vital signs in last 24 hours: Vitals:   10/07/19 1610 10/07/19 2013 10/07/19 2332 10/08/19 0800  BP: (!) 142/69 (!) 141/56 (!) 122/57 (!) 120/55  Pulse: 72 78 65 74  Resp: 20 17 16  (!) 23  Temp: 98.4 F (36.9 C) 98 F (36.7 C) 98.5 F (36.9 C) 98.7 F (37.1 C)  TempSrc: Oral Axillary Axillary Axillary  SpO2: 100% 100% 100% 90%  Weight:      Height:       Supplemental O2: 2 L  Physical Exam: Physical Exam  Constitutional: No distress.  HENT:  Head: Normocephalic and atraumatic.  Eyes: EOM are normal.  Cardiovascular: Normal rate and intact distal pulses.  Pulmonary/Chest: Effort normal. She has wheezes. She has rales.  Abdominal: She exhibits no distension. There is no abdominal tenderness.  Musculoskeletal:        General: Edema present. No tenderness.     Cervical back: Normal range of motion.  Skin: Skin is warm and dry. She is not diaphoretic.    Filed Weights   10/01/19 0347 10/02/19 0405 10/07/19 0411  Weight: 85.6 kg 84 kg 84.6 kg     Intake/Output Summary (Last 24 hours) at 10/08/2019 1211 Last data filed at 10/07/2019 2100 Gross per 24 hour  Intake --  Output 400 ml  Net -400 ml    Risk Score:  N/AA  Pertinent labs/Imaging: CBC Latest Ref Rng & Units 10/08/2019 10/06/2019 10/05/2019  WBC 4.0 - 10.5 K/uL 3.7(L) 3.2(L) 3.6(L)  Hemoglobin 12.0 - 15.0 g/dL 12/05/2019) 8.9(L) 8.5(L)  Hematocrit 36.0 - 46.0 % 29.1(L) 26.8(L) 26.0(L)  Platelets 150 -  400 K/uL 121(L) 97(L) 98(L)    CMP Latest Ref Rng & Units 10/07/2019 10/06/2019 10/05/2019  Glucose 70 - 99 mg/dL 90 93 94  BUN 8 - 23 mg/dL 7(L) 12/05/2019) <8(J)  Creatinine 0.44 - 1.00 mg/dL <1(B 1.47 8.29  Sodium 135 - 145 mmol/L 138 139 135  Potassium 3.5 - 5.1 mmol/L 4.4 3.8 3.6  Chloride 98 - 111 mmol/L 97(L) 100 99  CO2 22 - 32 mmol/L 32 29 28  Calcium 8.9 - 10.3 mg/dL 5.62) 8.1(L) 7.9(L)  Total Protein 6.5 - 8.1 g/dL - - -  Total Bilirubin 0.3 - 1.2 mg/dL - - -  Alkaline Phos 38 - 126 U/L - - -  AST 15 - 41 U/L - - -  ALT 0 - 44 U/L - - -    No results found.    Assessment/Plan:  Principal Problem:   Aspiration pneumonia ([facility]) Active Problems:   Down syndrome   OSA treated with BiPAP   Acquired hypothyroidism   History of pulmonary embolism   Dysphagia   Advanced care planning/counseling discussion   Goals of care, counseling/discussion   Palliative care by specialist   Acute respiratory failure with hypoxia ([facility])   Gastrostomy status ([facility])   Moderate malnutrition ([facility])    Patient Summary: Morene Cecilio is a 64 y.o. with pertinent PMH of Down syndrome,  Parkinson's disease, Alzheimer's, hypothyroidism, DVT/PE, heart block with pacemaker admit for aspiration pneumonia complicated by severe protein-caloric malnutrition and delirium on hospital day 16  #Malnutrition #Deconditioning -Continue tube feeds -Tries daily  #Down Syndrome: #Parkinson: -Continue carbidopa levodopa  #Seizure Disorder: -Continue AEDs  #Hypothyroidism: -Continue levo  #Hx of DVT: -Continue therapeutic Lovenox  #OSA: -Continue BiPAP as needed. -Continue nebulizer QID  Diet: Tube Feeds IVF: None,None VTE: Enoxaparin Code: Full PT/OT recs: Home Health TOC recs: pending   Dispo: Anticipated discharge tomorrow, pending family compliance.    Marianna Payment, D.O. MCIMTP, PGY-1 Date 10/08/2019 Time 12:11 PM

## 2019-10-09 LAB — CBC
HCT: 27 % — ABNORMAL LOW (ref 36.0–46.0)
Hemoglobin: 8.6 g/dL — ABNORMAL LOW (ref 12.0–15.0)
MCH: 32.8 pg (ref 26.0–34.0)
MCHC: 31.9 g/dL (ref 30.0–36.0)
MCV: 103.1 fL — ABNORMAL HIGH (ref 80.0–100.0)
Platelets: 125 10*3/uL — ABNORMAL LOW (ref 150–400)
RBC: 2.62 MIL/uL — ABNORMAL LOW (ref 3.87–5.11)
RDW: 14.2 % (ref 11.5–15.5)
WBC: 3.8 10*3/uL — ABNORMAL LOW (ref 4.0–10.5)
nRBC: 0 % (ref 0.0–0.2)

## 2019-10-09 LAB — PHOSPHORUS: Phosphorus: 5 mg/dL — ABNORMAL HIGH (ref 2.5–4.6)

## 2019-10-09 LAB — BASIC METABOLIC PANEL
Anion gap: 9 (ref 5–15)
BUN: 9 mg/dL (ref 8–23)
CO2: 31 mmol/L (ref 22–32)
Calcium: 8.5 mg/dL — ABNORMAL LOW (ref 8.9–10.3)
Chloride: 96 mmol/L — ABNORMAL LOW (ref 98–111)
Creatinine, Ser: 0.53 mg/dL (ref 0.44–1.00)
GFR calc Af Amer: >60 mL/min (ref >60)
GFR calc non Af Amer: >60 mL/min (ref >60)
Glucose, Bld: 104 mg/dL — ABNORMAL HIGH (ref 70–99)
Potassium: 4.3 mmol/L (ref 3.5–5.1)
Sodium: 136 mmol/L (ref 135–145)

## 2019-10-09 LAB — MAGNESIUM: Magnesium: 1.7 mg/dL (ref 1.7–2.4)

## 2019-10-09 MED ORDER — FOLIC ACID 1 MG PO TABS
1.0000 mg | ORAL_TABLET | Freq: Every day | ORAL | Status: DC
  Administered 2019-10-09 – 2019-10-12 (×4): 1 mg
  Filled 2019-10-09 (×4): qty 1

## 2019-10-09 MED ORDER — MAGNESIUM SULFATE 2 GM/50ML IV SOLN
2.0000 g | Freq: Once | INTRAVENOUS | Status: AC
  Administered 2019-10-09: 2 g via INTRAVENOUS
  Filled 2019-10-09: qty 50

## 2019-10-09 MED ORDER — VITAMIN B-12 100 MCG PO TABS
100.0000 ug | ORAL_TABLET | Freq: Every day | ORAL | Status: DC
  Administered 2019-10-09 – 2019-10-12 (×4): 100 ug
  Filled 2019-10-09 (×4): qty 1

## 2019-10-09 NOTE — TOC Benefit Eligibility Note (Signed)
Transition of Care [facility]) Benefit Eligibility Note    Patient Details  Name: Kaitlyn Powell MRN: [id number] Date of Birth: 03/10/1956   Medication/Dose: ENOXAPARIN  80 MG SQ BID  Covered?: Yes  Tier: (TIER-M 4 DRUG)  Prescription Coverage Preferred Pharmacy: [facility]  Spoke with Person/Company/Phone Number:: GAB  @   [facility] RX # [phone] OPT- 2 OPT- 3  Co-Pay: $1.30  Prior Approval: No  Deductible: (NO DEDUCTIBLE WITH PLAN)  Additional Notes: LOVENOX 80 MG SQ BID, NON-FORMULARY, P/A -YES # [phone]    Mardene Sayer Phone Number: 10/09/2019, 12:27 PM

## 2019-10-09 NOTE — Progress Notes (Signed)
SATURATION QUALIFICATIONS: (This note is used to comply with regulatory documentation for home oxygen)  Patient Saturations on Room Air at Rest = 72%  Patient Saturations on Room Air while Ambulating = patient is non-ambulatory  Patient Saturations on 3 Liters of oxygen while Ambulating = 96%  Please briefly explain why patient needs home oxygen: PT's O2 saturation declines quickly without supplemental oxygen

## 2019-10-09 NOTE — TOC Progression Note (Signed)
Transition of Care [facility]) - Progression Note    Patient Details  Name: Kaitlyn Powell MRN: [id number] Date of Birth: 1956-06-15  Transition of Care [facility]) CM/SW Contact  Bartholomew Crews, RN Phone Number: [phone] 10/09/2019, 1:40 PM  Clinical Narrative:    Spoke with patient's sister and brother-in-law at patient bedside this morning. Discussed transition planning as initiated by previous NCMs. Sister has determined that they do need a hospital bed and would also like to get the standard 3-N-1 as well. Verified DME order for oxygen based on increased oxygen needs from previous baseline. Spoke with [facility] liaison to verify all needed orders and documentation in place. Appreciate nursing assistance with documenting oxygen desat on RA.   Discussed home care needs - patient gets 78 hours/month of home care from [facility] agency, which is dispersed as 2 4-hour days each week. Sister is requesting an increase in hours, but has not been successful with getting increased hours to date. After researching patient situation, it was found that patient receives 32 hours of respite through [facility].   [place] 3051 completed, signed by MD, and faxed to [facility]. Spoke with liaison at [facility] who advised that patient does not currently have PCS hours with this program, but current hours may be part of other program. NCM received call back from RN at [facility] and initial assessment completed. Patient is not currently receiving PCS hours through Medicaid. Spoke with CM at [facility] who advised that they are connected with [facility] for PCS and have agreed to accept referral. NCM notified nurse at [employer] of selected agency who has already accepted the pending referral. [place] to send information to Medicaid who will send referral to [place].   [place] referral accepted by [facility] for RN, PT, OT. Unable to accept for ST at this time. Sister aware and chooses to  stick with current [facility] agency.   Patient DC pending delivery of DME by [facility].   Patient will need PTAR transport home.   TOC following transition needs.    Expected Discharge Plan: [place] Barriers to Discharge: Continued Medical Work up  Expected Discharge Plan and Services Expected Discharge Plan: [place] Choice: [place] arrangements for the past 2 months: Single Family Home                           HH Arranged: PT, OT, Speech Therapy [place]: [place] ([facility]) Date [place]: 09/25/19 Time [place]: 1103 Representative spoke with at [place]: [place] ([place]) Interventions    Readmission Risk Interventions No flowsheet data found.

## 2019-10-09 NOTE — Progress Notes (Signed)
Subjective: HD#17 Events Overnight: No events overnight  Patient was seen this morning on rounds.  Patient resting comfortably in bed.  She is more alert today than yesterday.  She is still not speaking but responding to physical stimuli.  Counseled the patient's family regarding discharge plans.  Patient's were answered thoroughly.  Objective:  Vital signs in last 24 hours: Vitals:   10/08/19 2006 10/08/19 2248 10/09/19 0251 10/09/19 0500  BP:  (!) 112/53 (!) 101/48   Pulse:      Resp:      Temp:  98.2 F (36.8 C) 98 F (36.7 C)   TempSrc:  Axillary Axillary   SpO2: 96%     Weight:    82 kg  Height:       Supplemental O2: 3L  Physical Exam: Physical Exam  Constitutional: No distress.  HENT:  Head: Normocephalic and atraumatic.  Eyes: EOM are normal.  Cardiovascular: Normal rate and intact distal pulses.  Pulmonary/Chest: Effort normal and breath sounds normal.  Abdominal: Soft. She exhibits no distension. There is no abdominal tenderness.  Musculoskeletal:        General: No tenderness or edema.     Cervical back: Normal range of motion.  Neurological: She is alert.  Skin: Skin is warm and dry. She is not diaphoretic.  G-tube looks clean, dry without erythema    Filed Weights   10/02/19 0405 10/07/19 0411 10/09/19 0500  Weight: 84 kg 84.6 kg 82 kg     Intake/Output Summary (Last 24 hours) at 10/09/2019 0600 Last data filed at 10/09/2019 0200 Gross per 24 hour  Intake 2916.68 ml  Output 2000 ml  Net 916.68 ml    Risk Score:  N/A  Pertinent labs/Imaging: CBC Latest Ref Rng & Units 10/09/2019 10/08/2019 10/06/2019  WBC 4.0 - 10.5 K/uL 3.8(L) 3.7(L) 3.2(L)  Hemoglobin 12.0 - 15.0 g/dL 1.6(X) 0.9(U) 8.9(L)  Hematocrit 36.0 - 46.0 % 27.0(L) 29.1(L) 26.8(L)  Platelets 150 - 400 K/uL 125(L) 121(L) 97(L)    CMP Latest Ref Rng & Units 10/09/2019 10/07/2019 10/06/2019  Glucose 70 - 99 mg/dL 045(W) 90 93  BUN 8 - 23 mg/dL 9 7(L) <0(J)  Creatinine 0.44 - 1.00 mg/dL 8.11  9.14 7.82  Sodium 135 - 145 mmol/L 136 138 139  Potassium 3.5 - 5.1 mmol/L 4.3 4.4 3.8  Chloride 98 - 111 mmol/L 96(L) 97(L) 100  CO2 22 - 32 mmol/L 31 32 29  Calcium 8.9 - 10.3 mg/dL 9.5(A) 2.1(H) 8.1(L)  Total Protein 6.5 - 8.1 g/dL - - -  Total Bilirubin 0.3 - 1.2 mg/dL - - -  Alkaline Phos 38 - 126 U/L - - -  AST 15 - 41 U/L - - -  ALT 0 - 44 U/L - - -    No results found.  Assessment/Plan:  Principal Problem:   Aspiration pneumonia (HCC) Active Problems:   Down syndrome   OSA treated with BiPAP   Acquired hypothyroidism   History of pulmonary embolism   Dysphagia   Advanced care planning/counseling discussion   Goals of care, counseling/discussion   Palliative care by specialist   Acute respiratory failure with hypoxia (HCC)   Gastrostomy status (HCC)   Moderate malnutrition (HCC)   Patient Summary: Kaitlyn Powell is a 64 y.o. with pertinent PMH of Down syndrome, Parkinson's disease, Alzheimer's, hypothyroidism, DVT/PE, heart block s/p pacemaker who presented with altered mental status and respiratory distress and admit for aspiration pneumonia complicated by severe protein-caloric malnutrition and delirium on hospital  day 17  #Malnutrition #Deconditioning -Continue tube feeds -Supplement B12 and folate  #Down Syndrome: #Parkinson: -Continue carbidopa levodopa  #Seizure Disorder: -Continue AED medications  #Hypothyroidism: -Continue levothyroxine  #Hx of DVT: -Continue therapeutic Lovenox  #OSA: -BiPAP as needed -Nebulizers 4 times daily  Diet: Tube Feeds IVF: None,None VTE: Enoxaparin Code: Full PT/OT recs: Home Health TOC recs: Pending discharge   Dispo: Anticipated discharge tomorrow.     Marianna Payment, D.O. [facility], PGY-1 Date 10/09/2019 Time 6:00 AM

## 2019-10-09 NOTE — Progress Notes (Signed)
Internal Medicine Attending Note:  I have seen and evaluated this patient and I have discussed the plan of care with the house staff. Please see their note for complete details. I concur with their findings.  Reymundo Poll, MD 10/09/2019, 12:34 PM

## 2019-10-09 NOTE — Progress Notes (Signed)
    Durable Medical Equipment  (From admission, onward)         Start     Ordered   10/09/19 1025  For home use only DME Hospital bed  Once    Question Answer Comment  Length of Need 6 Months   Patient has (list medical condition): Down syndrome, Alzheimer's, Parkinson's disease, heart block status post pacemaker, DVT/PE   Head must be elevated greater than: 45 degrees   Bed type Semi-electric   Hoyer Lift Yes      10/09/19 1027   10/06/19 1133  For home use only DME oxygen  Once    Question Answer Comment  Length of Need 6 Months   Mode or (Route) Nasal cannula   Liters per Minute 4   Frequency Continuous (stationary and portable oxygen unit needed)   Oxygen conserving device Yes   Oxygen delivery system Gas      10/06/19 1132   10/04/19 0650  For home use only DME Tube feeding  Once     10/04/19 0649   10/03/19 1629  For home use only DME Other see comment  Once    Comments: Hoyer lift with split seat sling.  Question:  Length of Need  Answer:  Lifetime   10/03/19 1628   09/30/19 1036  For home use only DME 3 n 1  Once     09/30/19 1036

## 2019-10-10 LAB — BASIC METABOLIC PANEL
Anion gap: 8 (ref 5–15)
BUN: 12 mg/dL (ref 8–23)
CO2: 32 mmol/L (ref 22–32)
Calcium: 8.8 mg/dL — ABNORMAL LOW (ref 8.9–10.3)
Chloride: 95 mmol/L — ABNORMAL LOW (ref 98–111)
Creatinine, Ser: 0.76 mg/dL (ref 0.44–1.00)
GFR calc Af Amer: >60 mL/min (ref >60)
GFR calc non Af Amer: >60 mL/min (ref >60)
Glucose, Bld: 91 mg/dL (ref 70–99)
Potassium: 4.4 mmol/L (ref 3.5–5.1)
Sodium: 135 mmol/L (ref 135–145)

## 2019-10-10 LAB — PHOSPHORUS: Phosphorus: 5.3 mg/dL — ABNORMAL HIGH (ref 2.5–4.6)

## 2019-10-10 LAB — MAGNESIUM: Magnesium: 2 mg/dL (ref 1.7–2.4)

## 2019-10-10 MED ORDER — ENOXAPARIN SODIUM 80 MG/0.8ML ~~LOC~~ SOLN
80.0000 mg | Freq: Two times a day (BID) | SUBCUTANEOUS | Status: DC
  Administered 2019-10-10 – 2019-10-12 (×5): 80 mg via SUBCUTANEOUS
  Filled 2019-10-10 (×5): qty 0.8

## 2019-10-10 NOTE — Progress Notes (Signed)
Patient tolerated bipap overnight

## 2019-10-10 NOTE — TOC Progression Note (Signed)
Transition of Care [facility]) - Progression Note    Patient Details  Name: Kaitlyn Powell MRN: [id number] Date of Birth: 1955-12-23  Transition of Care [facility]) CM/SW Contact  Bess Kinds, RN Phone Number: [phone] 10/10/2019, 1:47 PM  Clinical Narrative:    Spoke with patient's sister, [name], at patient's bedside this morning.   Discussed expedited PCS hours and follow up assessment in the home for more comprehensive evaluation of PCS need in the home.   Discussed delivery of DME. [name] is working with [facility] to deliver all items in one delivery.   Discussed ongoing teaching with nursing and dietitian for bolus feeds and medication administration.   Will transition home once DME delivered.   TOC following for transition needs.    Expected Discharge Plan: Home w Home Health Services Barriers to Discharge: Continued Medical Work up  Expected Discharge Plan and Services Expected Discharge Plan: Home w Home Health Services     Post Acute Care Choice: Home Health Living arrangements for the past 2 months: Single Family Home                           HH Arranged: PT, OT, Speech Therapy HH Agency: [facility]) Date HH Agency Contacted: 09/25/19 Time HH Agency Contacted: 1103 Representative spoke with at [facility] Agency: Lupita Leash   Social Determinants of Health (SDOH) Interventions    Readmission Risk Interventions No flowsheet data found.

## 2019-10-10 NOTE — Progress Notes (Signed)
Physical Therapy Treatment Patient Details Name: Kaitlyn Powell MRN: [id number] DOB: [birth year] Today's Date: 10/10/2019    History of Present Illness Pt is a 64 y.o female with PMH of Down Syndrome and Parkinson's disease, admitted 09/21/19 with hypoxia and respiratory distress. Worked up for aspiration PNA. Other PMH includes DVT on Xarelto, heart block (s/p pacemaker 2017), hypothyroidism, seizures, osteoporosis, multiple chronic T-spine fxs, OSA.    PT Comments    Pt resting on 2.5L of O2. SpO2 remains between 96-98% throughout tx. Pt has frequent visitors from medical staff during session today. Pt's sister (primary caregiver) asking about therapeutic exercises she can perform at home with pt. Sister is already performing UE and LE PROM and educated well. Pt is total A x 2 for rolling to place lift pad underneath her. Pt transferred slowly via [place] to recliner with careful positioning of IV and feeding tube. Pt's sister asks if pt can stand today. Upon scooting to the edge of recliner pt is determine to be unsafe to stand today due to significant LE and trunk weakness and requiring mod A x 1-2 to maintain an upright trunk in seated. Pt is unable to perform AROM of quads nor hamstrings on command. Pt's sister is educated on practicing upright unsupported sitting balance to help improve pt's core control. D/C plans remain appropriate however pt's family is adamant on pt going home with [place]. Pt's sister reports pt used to go to [employer] with her however since September/ October her mobility has significantly declined due to new Parkinson diagnosis. PT will continue to follow acutely.     Follow Up Recommendations  Supervision/Assistance - 24 hour;SNF     Equipment Recommendations  3in1 (PT);Hospital bed;Other (comment)(lift)       Precautions / Restrictions Precautions Precautions: Fall Precaution Comments: feeding tube Restrictions Weight Bearing Restrictions: No    Mobility   Bed Mobility Overal bed mobility: Needs Assistance Bed Mobility: Rolling Rolling: +2 for physical assistance;Total assist         General bed mobility comments: total A x 2 when rolling to place lift pad underneath her  Transfers Overall transfer level: Needs assistance Equipment used: 2 person hand held assist Transfers: (unable)           General transfer comment: Pt significantly limited in transfer ability due to decreased strength. Lift is most appropriate at this time and family will be getting a lift for home.  Ambulation/Gait             General Gait Details: unable          Balance Overall balance assessment: Needs assistance Sitting-balance support: Bilateral upper extremity supported Sitting balance-Leahy Scale: Poor Sitting balance - Comments: Pt sitting upright in recliner. Pt scooted forward max A to work on seated upright balance. Requires mod A x 1-2 to maintain upright balance. Postural control: Posterior lean;Left lateral lean                                  Cognition Arousal/Alertness: Awake/alert Behavior During Therapy: Flat affect Overall Cognitive Status: History of cognitive impairments - at baseline                                 General Comments: Pt making a few word statements during session. Pleasant in nature      Exercises General Exercises - Lower  Extremity Ankle Circles/Pumps: AAROM;Both;10 reps;Seated Long Arc Quad: PROM;Both;10 reps;Seated Hip ABduction/ADduction: PROM;Both;10 reps;Seated Other Exercises Other Exercises: Upright seated trunk control in recliner. Pt scooted forward in recliner to work on unsupported trunk control. Pt requires cues to maintain upright posture and mod A x 1-2 to maintain balance.  Other Exercises: Family education on exercises at home including PROM/ AAROM and seated trunk control.        Pertinent Vitals/Pain Pain Assessment: No/denies pain Faces Pain Scale:  No hurt           PT Goals (current goals can now be found in the care plan section) Acute Rehab PT Goals Patient Stated Goal: per sister, to get her set up at home and improve mobility as much as possible PT Goal Formulation: With patient/family Time For Goal Achievement: 10/06/19 Potential to Achieve Goals: Fair Progress towards PT goals: Not progressing toward goals - comment(limited prog towards goals due to progressive disease/ PLOF)    Frequency    Min 2X/week      PT Plan Current plan remains appropriate    Co-evaluation PT/OT/SLP Co-Evaluation/Treatment: Yes Reason for Co-Treatment: For patient/therapist safety PT goals addressed during session: Mobility/safety with mobility        AM-PAC PT "6 Clicks" Mobility   Outcome Measure  Help needed turning from your back to your side while in a flat bed without using bedrails?: Total Help needed moving from lying on your back to sitting on the side of a flat bed without using bedrails?: Total Help needed moving to and from a bed to a chair (including a wheelchair)?: Total Help needed standing up from a chair using your arms (e.g., wheelchair or bedside chair)?: Total Help needed to walk in hospital room?: Total Help needed climbing 3-5 steps with a railing? : Total 6 Click Score: 6    End of Session Equipment Utilized During Treatment: Oxygen Activity Tolerance: Patient tolerated treatment well Patient left: in bed;in chair;with call bell/phone within reach;with family/visitor present;with nursing/sitter in room Nurse Communication: Mobility status PT Visit Diagnosis: Muscle weakness (generalized) (M62.81);Other symptoms and signs involving the nervous system (R29.898)     Time: 2979-8921 PT Time Calculation (min) (ACUTE ONLY): 42 min  Charges:  $Therapeutic Activity: 8-22 mins                     Jodean Lima, PT, DPT Acute Rehabilitation Services Office [phone]   Jodean Lima 10/10/2019, 12:44  PM

## 2019-10-10 NOTE — Progress Notes (Signed)
Subjective: HD#18 Events Overnight: No events overnight  Patient was seen this morning on rounds.  Patient resting comfortably in bed.  Starting to work with PT.  She appears more alert today.  Objective:  Vital signs in last 24 hours: Vitals:   10/10/19 0200 10/10/19 0300 10/10/19 0339 10/10/19 0453  BP:      Pulse:  61 62   Resp: 16 17 20    Temp:  98 F (36.7 C)    TempSrc:  Axillary    SpO2:  93% 96% 95%  Weight:   81.4 kg   Height:       Supplemental O2: 2 L   Physical Exam: Physical Exam  Constitutional: She is well-developed, well-nourished, and in no distress.  HENT:  Head: Normocephalic and atraumatic.  Eyes: EOM are normal.  Cardiovascular: Normal rate and intact distal pulses.  Pulmonary/Chest: Effort normal. No respiratory distress.  Abdominal: Soft. She exhibits no distension. There is no abdominal tenderness.  Musculoskeletal:        General: Normal range of motion.     Cervical back: Normal range of motion.  Neurological: She is alert.  Skin: Skin is warm and dry.    Filed Weights   10/07/19 0411 10/09/19 0500 10/10/19 0339  Weight: 84.6 kg 82 kg 81.4 kg     Intake/Output Summary (Last 24 hours) at 10/10/2019 0547 Last data filed at 10/10/2019 0300 Gross per 24 hour  Intake 2690.27 ml  Output 1925 ml  Net 765.27 ml    Risk Score:    Pertinent labs/Imaging: CBC Latest Ref Rng & Units 10/09/2019 10/08/2019 10/06/2019  WBC 4.0 - 10.5 K/uL 3.8(L) 3.7(L) 3.2(L)  Hemoglobin 12.0 - 15.0 g/dL 12/06/2019) 6.7(T) 8.9(L)  Hematocrit 36.0 - 46.0 % 27.0(L) 29.1(L) 26.8(L)  Platelets 150 - 400 K/uL 125(L) 121(L) 97(L)    CMP Latest Ref Rng & Units 10/10/2019 10/09/2019 10/07/2019  Glucose 70 - 99 mg/dL 91 12/07/2019) 90  BUN 8 - 23 mg/dL 12 9 7(L)  Creatinine 809(X - 1.00 mg/dL 8.33 8.25 0.53  Sodium 135 - 145 mmol/L 135 136 138  Potassium 3.5 - 5.1 mmol/L 4.4 4.3 4.4  Chloride 98 - 111 mmol/L 95(L) 96(L) 97(L)  CO2 22 - 32 mmol/L 32 31 32  Calcium 8.9 - 10.3 mg/dL  9.76) 7.3(A) 1.9(F)  Total Protein 6.5 - 8.1 g/dL - - -  Total Bilirubin 0.3 - 1.2 mg/dL - - -  Alkaline Phos 38 - 126 U/L - - -  AST 15 - 41 U/L - - -  ALT 0 - 44 U/L - - -    No results found.   Assessment/Plan:  Principal Problem:   Aspiration pneumonia (HCC) Active Problems:   Down syndrome   OSA treated with BiPAP   Acquired hypothyroidism   History of pulmonary embolism   Dysphagia   Advanced care planning/counseling discussion   Goals of care, counseling/discussion   Palliative care by specialist   Acute respiratory failure with hypoxia (HCC)   Gastrostomy status (HCC)   Moderate malnutrition (HCC)    Patient Summary: Kaitlyn Powell is a 64 y.o. with pertinent PMH of Down syndrome, Parkinson's disease, Alzheimer's, hypothyroidism, DVT/PE, heart block s/p pacemaker who presented with altered mental status and respiratory distress and admit for aspiration pneumonia complicated by severe protein-caloric malnutrition and delirium on hospital day 18  #Malnutrition #Deconditioning All arrangements have been made to continue tube feeds at home.  Home health has been set up for PT, OT, nurse  aide. -Continue tube feeds -Continue PT/OT  - Spoke to Rd and patient's nurse about educating the family regarding tube feeds and medications.  #Hx of DVT: I will discuss with the patient's family tomorrow, regarding the choice of using edoxaban versus Lovenox for anticoagulation moving forward.  I spoke with transition of care and they stated that we will both will likely need a prior authorization. -Anticoagulation with edoxaban versus Lovenox.  #OSA: Patient will be discharged on her home BiPAP unit.  This is already set up at the patient's house. -Continue BiPAP and nebulizers QID  Diet: Tube Feeds IVF: None,None VTE: Enoxaparin Code: Full PT/OT recs: Home Health TOC recs: Discharge home with Home health   Dispo: Anticipated discharge tomorrow.    Marianna Payment, D.O.  [facility], PGY-1 Date 10/10/2019 Time 5:47 AM

## 2019-10-10 NOTE — Progress Notes (Signed)
Occupational Therapy Treatment Patient Details Name: Kaitlyn Powell MRN: [id number] DOB: September 04, 1955 Today's Date: 10/10/2019    History of present illness Pt is a 64 y.o female with PMH of Down Syndrome and Parkinson's disease, admitted 09/21/19 with hypoxia and respiratory distress. Worked up for aspiration PNA. Other PMH includes DVT on Xarelto, heart block (s/p pacemaker 2017), hypothyroidism, seizures, osteoporosis, multiple chronic T-spine fxs, OSA.   OT comments  Patient supine in bed on arrival with sister in the room.  She was on 2.5L O2 throughout and SpO2 remained >95.  Patient required total assist x2 with ADLs and mobility, using maximove to transfer to chair. Completed seated UE strengthening and AAROM (patient able to assist minimally).  In seated therapists assisted patient in sitting up straight with back off the chair.  She required max assist to maintain balance.  Sister is very helpful and motivating.  She was requesting home exercises to use once discharged.  Educated on some seated exercises and will follow up with more UE exercises. Will continue to follow with OT acutely to address the deficits listed below.    Follow Up Recommendations  No OT follow up    Equipment Recommendations  3 in 1 bedside commode;Hospital bed;Other (comment)    Recommendations for Other Services      Precautions / Restrictions Precautions Precautions: Fall Precaution Comments: feeding tube Restrictions Weight Bearing Restrictions: No       Mobility Bed Mobility Overal bed mobility: Needs Assistance Bed Mobility: Rolling Rolling: +2 for physical assistance;Total assist         General bed mobility comments: total A x 2 when rolling to place lift pad underneath her  Transfers Overall transfer level: Needs assistance Equipment used: 2 person hand held assist Transfers: (unable)           General transfer comment: Pt significantly limited in transfer ability due to decreased  strength. Lift is most appropriate at this time and family will be getting a lift for home.    Balance Overall balance assessment: Needs assistance Sitting-balance support: Bilateral upper extremity supported Sitting balance-Leahy Scale: Poor Sitting balance - Comments: Pt sitting upright in recliner. Pt scooted forward max A to work on seated upright balance. Requires mod A x 1-2 to maintain upright balance. Postural control: Posterior lean;Left lateral lean                                 ADL either performed or assessed with clinical judgement   ADL Overall ADL's : Needs assistance/impaired Eating/Feeding: Total assistance                   Lower Body Dressing: Total assistance               Functional mobility during ADLs: Total assistance;+2 for physical assistance;+2 for safety/equipment General ADL Comments: Used maximove to transfer to chair     Vision       Perception     Praxis      Cognition Arousal/Alertness: Awake/alert Behavior During Therapy: Flat affect Overall Cognitive Status: History of cognitive impairments - at baseline                                 General Comments: Pt making a few word statements during session. Pleasant in nature        Exercises Exercises: General Upper  Extremity General Exercises - Upper Extremity Shoulder Flexion: AAROM;Both;10 reps;Seated Elbow Flexion: AAROM;Both;10 reps;Seated Elbow Extension: AAROM;10 reps;Seated;Both General Exercises - Lower Extremity Ankle Circles/Pumps: AAROM;Both;10 reps;Seated Long Arc Quad: PROM;Both;10 reps;Seated Hip ABduction/ADduction: PROM;Both;10 reps;Seated Other Exercises Other Exercises: Upright seated trunk control in recliner. Pt scooted forward in recliner to work on unsupported trunk control. Pt requires cues to maintain upright posture and mod A x 1-2 to maintain balance.  Other Exercises: Family education on exercises at home including  PROM/ AAROM and seated trunk control.   Shoulder Instructions       General Comments      Pertinent Vitals/ Pain       Pain Assessment: Faces Faces Pain Scale: No hurt  Home Living                                          Prior Functioning/Environment              Frequency  Min 2X/week        Progress Toward Goals  OT Goals(current goals can now be found in the care plan section)  Progress towards OT goals: Progressing toward goals  Acute Rehab OT Goals Patient Stated Goal: per sister, to get her set up at home and improve mobility as much as possible OT Goal Formulation: With family Time For Goal Achievement: 10/12/19 Potential to Achieve Goals: Fair  Plan Discharge plan remains appropriate    Co-evaluation    PT/OT/SLP Co-Evaluation/Treatment: Yes Reason for Co-Treatment: For patient/therapist safety;To address functional/ADL transfers;Complexity of the patient's impairments (multi-system involvement) PT goals addressed during session: Mobility/safety with mobility OT goals addressed during session: Strengthening/ROM;ADL's and self-care      AM-PAC OT "6 Clicks" Daily Activity     Outcome Measure   Help from another person eating meals?: Total Help from another person taking care of personal grooming?: Total Help from another person toileting, which includes using toliet, bedpan, or urinal?: Total Help from another person bathing (including washing, rinsing, drying)?: Total Help from another person to put on and taking off regular upper body clothing?: Total Help from another person to put on and taking off regular lower body clothing?: Total 6 Click Score: 6    End of Session Equipment Utilized During Treatment: Oxygen;Other (comment)(Maximove)  OT Visit Diagnosis: Unsteadiness on feet (R26.81);Cognitive communication deficit (R41.841)   Activity Tolerance Patient tolerated treatment well   Patient Left in chair;with call  bell/phone within reach;with chair alarm set;with family/visitor present   Nurse Communication Mobility status;Need for lift equipment        Time: 1100-1144 OT Time Calculation (min): 44 min  Charges: OT General Charges $OT Visit: 1 Visit OT Treatments $Self Care/Home Management : 8-22 mins  Barbie Banner, OTR/L    [name] 10/10/2019, 2:25 PM

## 2019-10-10 NOTE — TOC Progression Note (Addendum)
Transition of Care [name]) - Progression Note    Patient Details  Name: Kaitlyn Powell MRN: [id number] Date of Birth: Sep 26, 1955  [facility]) CM/SW Contact  Bess Kinds, RN Phone Number: [phone] 10/10/2019, 3:33 PM  Clinical Narrative:    Notified by palliative to make referral for outpatient palliative services. Spoke with patient's sister, [name], at patient bedside to discuss referral. [name] stated that she thought that [facility] offered palliative services. Verified with [facility] liaison that [facility] does not offer palliative, but does refer with [facility]. Referral placed with [facility] referral line for outpatient palliative services.   [name] stated that she is meeting with the dietitian tomorrow to discuss tube feeding education.   TOC following for transition needs.    Expected Discharge Plan: Home w Home Health Services Barriers to Discharge: Continued Medical Work up  Expected Discharge Plan and Services Expected Discharge Plan: Home w Home Health Services     Post Acute Care Choice: Home Health Living arrangements for the past 2 months: Single Family Home                           HH Arranged: PT, OT, Speech Therapy HH Agency: [facility] ([facility] Date HH Agency Contacted: 09/25/19 Time HH Agency Contacted: 1103 Representative spoke with at [facility] Agency: Lupita Leash   Social Determinants of Health (SDOH) Interventions    Readmission Risk Interventions No flowsheet data found.

## 2019-10-10 NOTE — Progress Notes (Addendum)
  Speech Language Pathology Treatment: Dysphagia  Patient Details Name: Kaitlyn Powell MRN: [id number] DOB: 20-Nov-1955 Today's Date: 10/10/2019 Time: 8185-6314 SLP Time Calculation (min) (ACUTE ONLY): 24 min  Assessment / Plan / Recommendation Clinical Impression  Pt seen with sister, [name] present. Reviewed pt's progress and goals of care re: swallowing. Therapist used calming manner during oral care which she tolerated well and removed moderate amount of adhered dried mucous from her high arched hard palate. Sister was surprised how much removed and says she cleans her mouth every am. Therapist educated her to hard palate is where most secretions aggregate and dry.  Sitting upright position, provided pt with separate trials of ice chip then 1/2 teaspoon water. Better able to adduct lips to closer to spoon today and control bolus with minimal leakage. Swallow was initiated after approximately 4-8 seconds- no overt s/s aspiration however from MBS she had 2 instances of trace aspiration which was sensed. Clinical reasoning provided to sister that cannot rule out airway compromise completely because she did not cough. Pt closer to discharge per notesCorrie Dandy needs tube feed training. Sister reported pt will not be able to have home health ST with [facility] agency until they have one available. Will follow up once more but recommend HH ST when able for possible return to oral nutrition (in addition to tube feeds).   HPI HPI: 64 y.o with a hx of Down Syndrome, recent dx Parkinson's disease, DVT, heart block with a pacemaker in 2017, hypothyroidism, GERD, seizures, osteoporosis, multiple chronic T-spine fractures, and sleep apnea who presented to the ED with hypoxia and respiratory distress. Pt with increased respiratory effort and SOB, appeared to aspirate when eating soup.  Arrived to ED requiring CPAP and weaned to 10L nasal cannula.  Per notes, pt has hx of aspiration pna, remote hx PEG (removed 2017 after swallowing  issues were resolved).  Evaluated by this SLP on 08/02/18, at which time she presented with quite functional swallow with no overt concerns for aspiration - soft diet and thin liquids were recommended.  Her sister reports slight worsening in swallow function the last few months.       SLP Plan  Continue with current plan of care       Recommendations  Diet recommendations: NPO Medication Administration: Via alternative means                Oral Care Recommendations: Oral care QID Follow up Recommendations: Home health SLP SLP Visit Diagnosis: Dysphagia, unspecified (R13.10) Plan: Continue with current plan of care       GO                Royce Macadamia 10/10/2019, 11:04 AM

## 2019-10-10 NOTE — Progress Notes (Signed)
Palliative-  Spoke with Kaitlyn Powell in passing in the hall. She notes she has been busy during patient's hospitalization and not able to followup with Palliative Team.  Kaitlyn Powell is working hard to get patient home- plan is currently for discharge tomorrow. She acknowledges patient's needs have increased.  I offered referral for outpatient Palliative provider to visit in the home- [facility] agreed.  Referral placed to [facility] team.   Ocie Bob, AGNP-C Palliative Medicine  Please call Palliative Medicine team phone with any questions [phone]. For individual providers please see AMION.  No charge

## 2019-10-10 NOTE — Plan of Care (Signed)
Discussed plan of care in front of patient for the evening, pain management and changing feeding tube with no evidence of teach back at this time

## 2019-10-10 NOTE — Progress Notes (Signed)
Internal Medicine Attending Note:  I have seen and evaluated this patient and I have discussed the plan of care with the house staff. Please see their note for complete details. I concur with their findings.  Reymundo Poll, MD 10/10/2019, 1:45 PM

## 2019-10-10 NOTE — Progress Notes (Signed)
Nutrition Follow-up  DOCUMENTATION CODES:   Obesity unspecified  INTERVENTION:   Continue Osmolite 1.2 at 55 ml/h, provides 1584 kcal, 73 gm protein, 1082 ml free water daily.  Recommend free water flushes 150 ml QID.  If bolus feedings are desired, can transition to bolus regimen at home: Osmolite 1.2 330 ml QID. This would provide 1584 kcal, 73 gm protein, 1082 ml free water daily.   NUTRITION DIAGNOSIS:   Inadequate oral intake related to inability to eat as evidenced by NPO status.  Ongoing   GOAL:   Patient will meet greater than or equal to 90% of their needs  Met with TF  MONITOR:   TF tolerance, Labs, Weight trends  ASSESSMENT:   63 yo female admitted with AMS and aspiration pneumonia. PMH includes Down Syndrome with recent diagnosis of Parkinson's Disease. Seizures, hx of PEG tube which has been removed, GERD  S/P PEG placement 3/3. Receiving Osmolite 1.2 at 55 ml/h providing 1584 kcal, 73 gm protein, 1082 ml free water daily. Tolerating well. Plans for D/C home today.  Spoke with patient's sister regarding tube feeding plans for home. She would like to discuss with her husband whether or not they prefer bolus or continuous feedings at home.   Labs reviewed. Phos 5.3   Medications reviewed and include folic acid, vitamin B-12 tablet.  IVF: D10 at 25 ml/h  Diet Order:   Diet Order            Diet NPO time specified Except for: Ice Chips  Diet effective now              EDUCATION NEEDS:   Not appropriate for education at this time  Skin:  Skin Assessment: Reviewed RN Assessment  Last BM:  3/4  Height:   Ht Readings from Last 1 Encounters:  09/22/19 4' 10" (1.473 m)    Weight:   Wt Readings from Last 1 Encounters:  10/10/19 81.4 kg    Ideal Body Weight:  45.6 kg  BMI:  Body mass index is 37.51 kg/m.  Estimated Nutritional Needs:   Kcal:  1400-1600 kcals  Protein:  75-85 g  Fluid:  >/= 1.5 L     , RD, LDN,  CNSC Please refer to Amion for contact information.                                                        

## 2019-10-11 LAB — PHENYTOIN LEVEL, TOTAL: Phenytoin Lvl: 5 ug/mL — ABNORMAL LOW (ref 10.0–20.0)

## 2019-10-11 NOTE — TOC Progression Note (Signed)
Transition of Care [facility]) - Progression Note    Patient Details  Name: Kaitlyn Powell MRN: [id number] Date of Birth: 31-Dec-1955  Transition of Care [facility]) CM/SW Contact  Bess Kinds, RN Phone Number: [phone] 10/11/2019, 2:30 PM  Clinical Narrative:    Spoke with daughter at the bedside. Discussed palliative referral to [facility]. DME has been delivered except for Tube Feeding DME which will  be delivered later today. Anticipate readiness to transition home tomorrow. Will need PTAR.  [facility] notified. TOC following.    Expected Discharge Plan: Home w Home Health Services Barriers to Discharge: Continued Medical Work up  Expected Discharge Plan and Services Expected Discharge Plan: Home w Home Health Services     Post Acute Care Choice: Home Health Living arrangements for the past 2 months: Single Family Home                           HH Arranged: PT, OT, Speech Therapy HH Agency: [facility] Date HH Agency Contacted: 09/25/19 Time HH Agency Contacted: 1103 Representative spoke with at [facility] Agency: Lupita Leash   Social Determinants of Health (SDOH) Interventions    Readmission Risk Interventions No flowsheet data found.

## 2019-10-11 NOTE — Progress Notes (Signed)
Subjective: HD#19 Events Overnight: no events overnight.   Patient was seen this morning on rounds. She was alert and resetting comfortably in bed. Family was at home getting house ready for discharge.   Objective:  Vital signs in last 24 hours: Vitals:   10/10/19 2255 10/11/19 0045 10/11/19 0305 10/11/19 0500  BP: (!) 105/48  (!) 117/57   Pulse: 61 66 65   Resp: 20  16   Temp: 98.6 F (37 C)  98.2 F (36.8 C)   TempSrc: Oral  Axillary   SpO2: 98% 97% 99%   Weight:    76.9 kg  Height:       Supplemental O2: 2-4 L   Physical Exam: Physical Exam  Constitutional: She is oriented to person, place, and time. No distress.  HENT:  Head: Normocephalic and atraumatic.  Eyes: EOM are normal.  Cardiovascular: Normal rate and intact distal pulses.  Pulmonary/Chest: Effort normal. No respiratory distress.  Abdominal: Soft. There is no abdominal tenderness.  Musculoskeletal:        General: No tenderness or edema. Normal range of motion.     Cervical back: Normal range of motion.  Neurological: She is alert and oriented to person, place, and time.  Skin: Skin is dry. She is not diaphoretic.    Filed Weights   10/09/19 0500 10/10/19 0339 10/11/19 0500  Weight: 82 kg 81.4 kg 76.9 kg     Intake/Output Summary (Last 24 hours) at 10/11/2019 0639 Last data filed at 10/11/2019 6269 Gross per 24 hour  Intake 2117 ml  Output 1980 ml  Net 137 ml    Risk Score:  n/a  Pertinent labs/Imaging: CBC Latest Ref Rng & Units 10/09/2019 10/08/2019 10/06/2019  WBC 4.0 - 10.5 K/uL 3.8(L) 3.7(L) 3.2(L)  Hemoglobin 12.0 - 15.0 g/dL 4.8(N) 4.6(E) 8.9(L)  Hematocrit 36.0 - 46.0 % 27.0(L) 29.1(L) 26.8(L)  Platelets 150 - 400 K/uL 125(L) 121(L) 97(L)    CMP Latest Ref Rng & Units 10/10/2019 10/09/2019 10/07/2019  Glucose 70 - 99 mg/dL 91 703(J) 90  BUN 8 - 23 mg/dL 12 9 7(L)  Creatinine 0.09 - 1.00 mg/dL 3.81 8.29 9.37  Sodium 135 - 145 mmol/L 135 136 138  Potassium 3.5 - 5.1 mmol/L 4.4 4.3 4.4    Chloride 98 - 111 mmol/L 95(L) 96(L) 97(L)  CO2 22 - 32 mmol/L 32 31 32  Calcium 8.9 - 10.3 mg/dL 1.6(R) 6.7(E) 9.3(Y)  Total Protein 6.5 - 8.1 g/dL - - -  Total Bilirubin 0.3 - 1.2 mg/dL - - -  Alkaline Phos 38 - 126 U/L - - -  AST 15 - 41 U/L - - -  ALT 0 - 44 U/L - - -    No results found.    Assessment/Plan:  Principal Problem:   Aspiration pneumonia (HCC) Active Problems:   Down syndrome   OSA treated with BiPAP   Acquired hypothyroidism   History of pulmonary embolism   Dysphagia   Advanced care planning/counseling discussion   Goals of care, counseling/discussion   Palliative care by specialist   Acute respiratory failure with hypoxia (HCC)   Gastrostomy status (HCC)   Moderate malnutrition (HCC)    Patient Summary: Kaitlyn Powell is a 64 y.o. with pertinent PMH of Down syndrome, Parkinson's disease, Alzheimer's, hypothyroidism, DVT/PE, heart block s/p pacemaker who presented with altered mental status and respiratory distress and admit for aspiration pneumonia complicated by severe protein-caloric malnutrition and delirium on hospital day 19.  #Malnutrition #Deconditioning - All arrangements  made for discharge.  - Continue tube feeds  #Hx of DVT: I will talk to the family about anticoagulation moving forward.  #OSA: -Continue BiPAP and nebulizers QID  Diet: Tube Feeds IVF: None,None VTE: Enoxaparin Code: Full PT/OT recs: Home Health TOC recs: discharge home soon   Dispo: Anticipated discharge ho[pefully today.    Marianna Payment, D.O. MCIM[facility], PGY-1 Date 10/11/2019 Time 6:39 AM

## 2019-10-11 NOTE — Progress Notes (Signed)
Nutrition Brief Note  Visited with patient's sister [name] in patient's room. We reviewed TF schedule for home. She is receiving the TF formula, pump, and accessories this afternoon. RD contact information provided for any future questions or concerns.   Joaquin Courts, RD, LDN, CNSC Please refer to [facility] for contact information.

## 2019-10-11 NOTE — Progress Notes (Signed)
MEDICATION RELATED CONSULT NOTE  Pharmacy Consult: phenytoin Indication:  History of seizures  Allergies  Allergen Reactions  . Cephalexin Other (See Comments)    Seizures   . Namenda [Memantine Hcl] Other (See Comments)    Made memory issues worse, sleepiness, increased confusion    Patient Measurements: Height: 4\' 10"  (147.3 cm) Weight: 169 lb 8.5 oz (76.9 kg) IBW/kg (Calculated) : 40.9  Vital Signs: Temp: 98.2 F (36.8 C) (03/10 0305) Temp Source: Axillary (03/10 0305) BP: 117/57 (03/10 0305) Pulse Rate: 65 (03/10 0305)   Intake/Output from previous day: 03/09 0701 - 03/10 0700 In: 2117 [I.V.:498.9; NG/GT:1138.1; IV Piggyback:400] Out: 1480 [Urine:1480]   Intake/Output from this shift: No intake/output data recorded.  Labs: Recent Labs    10/09/19 0354 10/10/19 0430  WBC 3.8*  --   HGB 8.6*  --   HCT 27.0*  --   PLT 125*  --   CREATININE 0.53 0.76  MG 1.7 2.0  PHOS 5.0* 5.3*   Estimated Creatinine Clearance: 62.8 mL/min (by C-G formula based on SCr of 0.76 mg/dL).  Assessment: 9 YOF continues on Keppra, cannabidiol, and phenytoin for history of seizures. Pharmacy consulted to dose phenytoin (on 200mg  ER BID PTA) - currently on phenytoin 100mg  IV q8h (adjusted on 3/7).  Most recent phenytoin level 3/10 corrects to 11.9 (therapeutic) - noted, not a steady state level.  Plan:  -Continue phenytoin 100mg  IV q8h -F/u change to PO as appropriate -F/u clinical progress, SCr, LFTs, Albumin, phenytoin levels prn -Consider levels 5-7 days after dosage change; phenytoin will not be at true steady state for until 10-14 days   , PharmD, BCPS Please check AMION for all [facility] contact numbers Clinical Pharmacist 10/11/2019 12:59 PM

## 2019-10-11 NOTE — Progress Notes (Signed)
Patient removing nasal cannula, sats in 70's on room air,  mitts applied.

## 2019-10-11 NOTE — Progress Notes (Signed)
Physical Therapy Treatment Patient Details Name: Kaitlyn Powell MRN: [id number] DOB: 1956-04-28 Today's Date: 10/11/2019    History of Present Illness Pt is a 64 y.o female with PMH of Down Syndrome and Parkinson's disease, admitted 09/21/19 with hypoxia and respiratory distress. Worked up for aspiration PNA. Other PMH includes DVT on Xarelto, heart block (s/p pacemaker 2017), hypothyroidism, seizures, osteoporosis, multiple chronic T-spine fxs, OSA.    PT Comments    Pt is on 2L of O2 via HFNC. SpO2 resting is 96%. Pt is seated upright in recliner and pt's sister reports she's only been up for an hour. HEP provided to sister and reviewed today. Upright seated trunk control is challenged with various techniques when sitting with trunk unsupported at edge of chair. Pt demonstrates improved posture and trunk control today requiring min guard to mod A when pt fatigues. Pt desats to 88% during activity however quickly improves to the mid 90s. D/C plans remain appropriate. PT will continue to follow pt acutely.   Follow Up Recommendations  Supervision/Assistance - 24 hour;SNF     Equipment Recommendations  3in1 (PT);Hospital bed;Other (comment)       Precautions / Restrictions Precautions Precautions: Fall Precaution Comments: feeding tube Restrictions Weight Bearing Restrictions: No    Mobility  Bed Mobility               General bed mobility comments: Pt received in recliner.        Balance Overall balance assessment: Needs assistance Sitting-balance support: Feet supported;Single extremity supported;No upper extremity supported Sitting balance-Leahy Scale: Fair Sitting balance - Comments: Pt with improved sitting balance today. She is able to maintain balance with feet supported for short increments and when she fatigues she requires 1-2 HH assistance to maintain upright positioning. Postural control: Other (comment)(anterior lean)     Standing balance comment: unable                             Cognition Arousal/Alertness: Awake/alert Behavior During Therapy: WFL for tasks assessed/performed Overall Cognitive Status: History of cognitive impairments - at baseline                                 General Comments: Pt making a few word statements during session. Pleasant in nature and smiling throughout.      Exercises General Exercises - Upper Extremity Shoulder Flexion: AAROM;Both;10 reps;Seated Elbow Flexion: AAROM;Both;10 reps;Seated General Exercises - Lower Extremity Long Arc Quad: Both;10 reps;Seated;AAROM Other Exercises Other Exercises: Trunk control in seated; pt scooted to edge of chair max A x 2. Crossing midline, pertubations, and moving UEs AAROM for postural challenge. Pt requires varying assistance levels throughout time edge of chair. At times, she requires mod A to correct posture and other times she is able to sit min guard with hands in her lap and fee supported.        Pertinent Vitals/Pain Pain Assessment: Faces Faces Pain Scale: No hurt           PT Goals (current goals can now be found in the care plan section) Acute Rehab PT Goals Patient Stated Goal: per sister, to get her set up at home and improve mobility as much as possible PT Goal Formulation: With patient/family Time For Goal Achievement: 10/06/19 Potential to Achieve Goals: Fair Progress towards PT goals: Progressing toward goals    Frequency    Min 2X/week  PT Plan Current plan remains appropriate       AM-PAC PT "6 Clicks" Mobility   Outcome Measure  Help needed turning from your back to your side while in a flat bed without using bedrails?: Total Help needed moving from lying on your back to sitting on the side of a flat bed without using bedrails?: Total Help needed moving to and from a bed to a chair (including a wheelchair)?: Total Help needed standing up from a chair using your arms (e.g., wheelchair or bedside  chair)?: Total Help needed to walk in hospital room?: Total Help needed climbing 3-5 steps with a railing? : Total 6 Click Score: 6    End of Session Equipment Utilized During Treatment: Oxygen Activity Tolerance: Patient tolerated treatment well Patient left: in chair;with call bell/phone within reach;with family/visitor present Nurse Communication: Mobility status PT Visit Diagnosis: Muscle weakness (generalized) (M62.81);Other symptoms and signs involving the nervous system (R29.898)     Time: 8546-2703 PT Time Calculation (min) (ACUTE ONLY): 33 min  Charges:  $Therapeutic Exercise: 8-22 mins $Neuromuscular Re-education: 8-22 mins                     Jodelle Green, PT, DPT Acute Rehabilitation Services Office [phone]   Jodelle Green 10/11/2019, 3:51 PM

## 2019-10-12 MED ORDER — IPRATROPIUM BROMIDE 0.02 % IN SOLN: 0.5000 mg | RESPIRATORY_TRACT | 0 refills | Status: DC | PRN

## 2019-10-12 MED ORDER — PHENYTOIN 125 MG/5ML PO SUSP
100.0000 mg | Freq: Three times a day (TID) | ORAL | Status: DC
  Filled 2019-10-12 (×2): qty 4

## 2019-10-12 MED ORDER — CARBIDOPA-LEVODOPA 25-100 MG PO TABS: 1.0000 | ORAL_TABLET | Freq: Three times a day (TID) | ORAL | 3 refills | Status: DC

## 2019-10-12 MED ORDER — EPIDIOLEX 100 MG/ML PO SOLN: 3.4000 mL | Freq: Two times a day (BID) | ORAL | 0 refills | Status: AC

## 2019-10-12 MED ORDER — BUDESONIDE 0.25 MG/2ML IN SUSP: 0.2500 mg | Freq: Two times a day (BID) | RESPIRATORY_TRACT | 12 refills | Status: DC

## 2019-10-12 MED ORDER — ENOXAPARIN SODIUM 80 MG/0.8ML ~~LOC~~ SOLN: 80.0000 mg | Freq: Two times a day (BID) | SUBCUTANEOUS | 0 refills | Status: DC

## 2019-10-12 MED ORDER — FUROSEMIDE 20 MG PO TABS: 20.0000 mg | ORAL_TABLET | Freq: Every day | ORAL | 11 refills | Status: DC | PRN

## 2019-10-12 MED ORDER — PHENYTOIN SODIUM EXTENDED 200 MG PO CAPS: 200.0000 mg | ORAL_CAPSULE | Freq: Two times a day (BID) | ORAL | 3 refills | Status: DC

## 2019-10-12 MED ORDER — OSMOLITE 1.2 CAL PO LIQD: 1000.0000 mL | ORAL | 0 refills | Status: AC

## 2019-10-12 MED ORDER — IPRATROPIUM-ALBUTEROL 0.5-2.5 (3) MG/3ML IN SOLN: 3.0000 mL | Freq: Four times a day (QID) | RESPIRATORY_TRACT | 0 refills | Status: DC | PRN

## 2019-10-12 MED ORDER — ALBUTEROL SULFATE (2.5 MG/3ML) 0.083% IN NEBU: 2.5000 mg | INHALATION_SOLUTION | RESPIRATORY_TRACT | 0 refills | Status: DC | PRN

## 2019-10-12 MED ORDER — FOLIC ACID 1 MG PO TABS: 1.0000 mg | ORAL_TABLET | Freq: Every day | ORAL | 0 refills | Status: AC

## 2019-10-12 MED ORDER — SODIUM CHLORIDE 0.9% FLUSH: 10.0000 mL | Freq: Two times a day (BID) | INTRAVENOUS | 0 refills | Status: AC

## 2019-10-12 MED ORDER — SIMVASTATIN 40 MG PO TABS: 40.0000 mg | ORAL_TABLET | Freq: Every day | ORAL | 0 refills | Status: DC

## 2019-10-12 MED ORDER — CYANOCOBALAMIN 100 MCG PO TABS: 100.0000 ug | ORAL_TABLET | ORAL | 0 refills | Status: AC

## 2019-10-12 MED ORDER — LEVOTHYROXINE SODIUM 150 MCG PO TABS: 150.0000 ug | ORAL_TABLET | Freq: Every day | ORAL | 0 refills | Status: DC

## 2019-10-12 MED ORDER — SODIUM CHLORIDE 0.9% FLUSH: 10.0000 mL | INTRAVENOUS | 0 refills | Status: AC | PRN

## 2019-10-12 MED ORDER — LEVETIRACETAM ER 500 MG PO TB24: ORAL_TABLET | ORAL | 3 refills | Status: DC

## 2019-10-12 MED FILL — FUROSEMIDE 20 MG TAB: 20 | 30 days supply | Qty: 30 | Fill #0

## 2019-10-12 MED FILL — VITAMIN B-12 1000 MCG TABS: 1000 | 30 days supply | Qty: 30 | Fill #0

## 2019-10-12 MED FILL — ENOXAPARIN 80 MG/0.8 ML SYR: 80 | 30 days supply | Qty: 24 | Fill #0

## 2019-10-12 MED FILL — EPIDIOLEX 100 MG/ML SOLN: 100 | 12 days supply | Qty: 100 | Fill #0

## 2019-10-12 MED FILL — LEVOTHYROXINE 150 MCG TAB: 150 | 30 days supply | Qty: 30 | Fill #0

## 2019-10-12 MED FILL — FOLIC ACID 1 MG TABS: 1 | 30 days supply | Qty: 30 | Fill #0

## 2019-10-12 NOTE — Progress Notes (Signed)
Patient discharged by [facility] via stretcher, stable upon discharge. Discharge instructions reviewed with [name], patient's sister, prior to discharge, questions encouraged and understanding of all instructions was verbalized. Midline was removed with tip intact and tele monitor was discontinued prior to patient leaving.

## 2019-10-12 NOTE — Progress Notes (Signed)
Subjective: HD#20 Events Overnight: no overnight events.  Patient was seen this morning on rounds. Patient continues to have improved mentation.  She is more alert.  Patient's family are encouraged by her progress.  All of their questions were thoroughly answered at bedside.  Objective:  Vital signs in last 24 hours: Vitals:   10/11/19 2336 10/12/19 0000 10/12/19 0327 10/12/19 0346  BP:    117/62  Pulse: 66  62 64  Resp: 17  12 12   Temp:    98.2 F (36.8 C)  TempSrc:    Axillary  SpO2: 100% 100% 100% 96%  Weight:      Height:       Supplemental O2: 0-2 L   Physical Exam: Physical Exam  Constitutional: She is oriented to person, place, and time and well-developed, well-nourished, and in no distress.  HENT:  Head: Normocephalic and atraumatic.  Eyes: EOM are normal.  Cardiovascular: Normal rate and intact distal pulses.  Pulmonary/Chest: Effort normal. No respiratory distress.  Abdominal: Soft. She exhibits no distension. There is no abdominal tenderness.  Musculoskeletal:        General: No tenderness or edema. Normal range of motion.     Cervical back: Normal range of motion.  Neurological: She is alert and oriented to person, place, and time.  Skin: Skin is warm and dry.  Psychiatric: Affect normal.    Filed Weights   10/09/19 0500 10/10/19 0339 10/11/19 0500  Weight: 82 kg 81.4 kg 76.9 kg     Intake/Output Summary (Last 24 hours) at 10/12/2019 0610 Last data filed at 10/12/2019 0300 Gross per 24 hour  Intake 899.25 ml  Output 2150 ml  Net -1250.75 ml    Risk Score:  n/a  Pertinent labs/Imaging: CBC Latest Ref Rng & Units 10/09/2019 10/08/2019 10/06/2019  WBC 4.0 - 10.5 K/uL 3.8(L) 3.7(L) 3.2(L)  Hemoglobin 12.0 - 15.0 g/dL 12/06/2019) 1.6(S) 8.9(L)  Hematocrit 36.0 - 46.0 % 27.0(L) 29.1(L) 26.8(L)  Platelets 150 - 400 K/uL 125(L) 121(L) 97(L)    CMP Latest Ref Rng & Units 10/10/2019 10/09/2019 10/07/2019  Glucose 70 - 99 mg/dL 91 12/07/2019) 90  BUN 8 - 23 mg/dL 12 9  7(L)  Creatinine 0.44 - 1.00 mg/dL 016(W 1.09 3.23  Sodium 135 - 145 mmol/L 135 136 138  Potassium 3.5 - 5.1 mmol/L 4.4 4.3 4.4  Chloride 98 - 111 mmol/L 95(L) 96(L) 97(L)  CO2 22 - 32 mmol/L 32 31 32  Calcium 8.9 - 10.3 mg/dL 5.57) 3.2(K) 0.2(R)  Total Protein 6.5 - 8.1 g/dL - - -  Total Bilirubin 0.3 - 1.2 mg/dL - - -  Alkaline Phos 38 - 126 U/L - - -  AST 15 - 41 U/L - - -  ALT 0 - 44 U/L - - -    No results found.    Assessment/Plan:  Principal Problem:   Aspiration pneumonia (HCC) Active Problems:   Down syndrome   OSA treated with BiPAP   Acquired hypothyroidism   History of pulmonary embolism   Dysphagia   Advanced care planning/counseling discussion   Goals of care, counseling/discussion   Palliative care by specialist   Acute respiratory failure with hypoxia (HCC)   Gastrostomy status (HCC)   Moderate malnutrition (HCC)    Patient Summary: Kaitlyn Powell is a 64 y.o. with pertinent PMH of Down syndrome, Parkinson's disease, Alzheimer's, hypothyroidism, DVT/PE, heart block s/p pacemaker who presented with altered mental status and respiratory distress and admit for aspiration pneumonia complicated by severe  protein-caloric malnutrition and delirium on hospital day 20  #Malnutrition #Deconditioning - All arrangements made for discharge.  - Continue tube feeds  #Hx of DVT: I will talk to the family about anticoagulation moving forward. For now we will continue enoxaparin.   #OSA: -Continue BiPAP and nebulizersQID  Diet: Tube Feeds IVF: None,None VTE: Enoxaparin Code: Full PT/OT recs: Home Health TOC recs: Home health set up   Dispo: Anticipated discharge today.    Marianna Payment, D.O. [facility], PGY-1 Date 10/12/2019 Time 6:10 AM

## 2019-10-12 NOTE — Progress Notes (Signed)
Internal Medicine Attending Note:  I have seen and evaluated this patient and I have discussed the plan of care with the house staff. Please see their note for complete details. I concur with their findings.  Reymundo Poll, MD 10/12/2019, 2:16 PM

## 2019-10-12 NOTE — Progress Notes (Signed)
SATURATION QUALIFICATIONS: (This note is used to comply with regulatory documentation for home oxygen)  Patient Saturations on Room Air at Rest = 82%  Patient Saturations on Room Air while Ambulating = patient non ambulatory  Patient Saturations on 2 Liters of oxygen while Ambulating = patient non ambulatory  Please briefly explain why patient needs home oxygen: patient o2 level drops when on room air at rest.

## 2019-10-12 NOTE — TOC Transition Note (Signed)
Transition of Care [facility]) - CM/SW Discharge Note   Patient Details  Name: Kaitlyn Powell MRN: [id number] Date of Birth: 01-Oct-1955  Transition of Care [facility]) CM/SW Contact:  Bess Kinds, RN Phone Number: [phone] 10/12/2019, 3:17 PM   Clinical Narrative:    Patient to transition home with sister today. Discharge medications filled by [facility] pharmacy and delivered to the room. TF and equipment delivered to the home. Portable oxygen delivered to the home. PTAR arranged for transport. Medical transport paperwork on chart. No further TOC needs identified.    Final next level of care: Home w Home Health Services Barriers to Discharge: No Barriers Identified   Patient Goals and CMS Choice   CMS Medicare.gov Compare Post Acute Care list provided to:: Patient Represenative (must comment)([name]) Choice offered to / list presented to : Sibling  Discharge Placement                Patient to be transferred to facility by: [facility] Name of family member notified: [name] Patient and family notified of of transfer: 10/12/19  Discharge Plan and Services     Post Acute Care Choice: Home Health          DME Arranged: Oxygen, Hospital bed, Tube feeding, Tube feeding pump DME Agency: [employer] Date DME Agency Contacted: 10/12/19 Time DME Agency Contacted: 1400 Representative spoke with at DME Agency: Ian Malkin HH Arranged: PT, OT, RN HH Agency: [facility] Date HH Agency Contacted: 10/12/19 Time HH Agency Contacted: 1516 Representative spoke with at [facility] Agency: Lupita Leash  Social Determinants of Health (SDOH) Interventions     Readmission Risk Interventions No flowsheet data found.

## 2019-10-12 NOTE — Progress Notes (Signed)
Patient had 2 episodes of a saturated bed during 7p-7a shift. Patient able to void, purewick in place, I/O charted.

## 2019-10-16 ENCOUNTER — Telehealth: Payer: Self-pay | Admitting: Pulmonary Disease

## 2019-10-16 DIAGNOSIS — J690 Pneumonitis due to inhalation of food and vomit: Secondary | ICD-10-CM

## 2019-10-16 NOTE — Telephone Encounter (Signed)
Called and spoke with [name] letting her know that we will get a repeat cxr day of pt's appt for them to arrive about early. [name] verbalized understanding. Order has been placed and pt's appt notes has been updated stating to have cxr prior. Nothing further needed.

## 2019-10-16 NOTE — Telephone Encounter (Signed)
Called and spoke with pt's sister [name]. [name] is wanting to know if a cxr should be done prior to pt's HFU appt with Dr. Everardo All which is scheduled 4/12 as pt was admitted in hospital 2/18 with bilateral pna and was in hosp for 21 days prior to being discharged 3/11.  Beth, please advise on this. Thanks!

## 2019-10-16 NOTE — Telephone Encounter (Signed)
Yes go ahead and place CXR order stat - come 20 min early

## 2019-10-18 NOTE — Discharge Summary (Signed)
Name: Kaitlyn Powell MRN: [id number] DOB: 08-Nov-1955 64 y.o. PCP: Tracey Harries, MD  Date of Admission: 09/21/2019  8:00 PM Date of Discharge: 10/12/2019 Attending Physician: Reymundo Poll, MD  Discharge Diagnosis: 1.  Aspiration pneumonia  Discharge Medications: Allergies as of 10/12/2019      Reactions   Cephalexin Other (See Comments)   Seizures    Namenda [memantine Hcl] Other (See Comments)   Made memory issues worse, sleepiness, increased confusion      Medication List    STOP taking these medications   citalopram 20 MG tablet Commonly known as: CELEXA   risperiDONE 1 MG tablet Commonly known as: RISPERDAL   Senna 8.8 MG/5ML Syrp   Xarelto 20 MG Tabs tablet Generic drug: rivaroxaban     TAKE these medications   albuterol (2.5 MG/3ML) 0.083% nebulizer solution Commonly known as: PROVENTIL Take 3 mLs (2.5 mg total) by nebulization every 4 (four) hours as needed for wheezing or shortness of breath.   carbidopa-levodopa 25-100 MG tablet Commonly known as: SINEMET IR Place 1 tablet into feeding tube 3 (three) times daily. Take 1.5 tablet three times a day with meals What changed: how to take this   cyanocobalamin 100 MCG tablet Place 1 tablet (100 mcg total) into feeding tube every morning. What changed: how to take this   enoxaparin 80 MG/0.8ML injection Commonly known as: Lovenox Inject 0.8 mLs (80 mg total) into the skin every 12 (twelve) hours.   Epidiolex 100 MG/ML Soln Generic drug: Cannabidiol Give 3.4 mLs (340 mg total) by tube in the morning and at bedtime. Take 3.3mL twice a day (will graduate up to 3.7) What changed:   how much to take  how to take this  when to take this  additional instructions   feeding supplement (OSMOLITE 1.2 CAL) Liqd Place 1,000 mLs into feeding tube continuous.   folic acid 1 MG tablet Commonly known as: FOLVITE Place 1 tablet (1 mg total) into feeding tube daily. Take 1 mg  by mouth  daily What changed:     how much to take  how to take this  when to take this   furosemide 20 MG tablet Commonly known as: Lasix Place 1 tablet (20 mg total) into feeding tube daily as needed for fluid or edema (to maintain dry weight.). What changed:   how to take this  when to take this  reasons to take this   [employer] Misc 1 Product by Does not apply route daily.   ipratropium 0.02 % nebulizer solution Commonly known as: ATROVENT Take 2.5 mLs (0.5 mg total) by nebulization every 4 (four) hours as needed for wheezing or shortness of breath.   levETIRAcetam 500 MG 24 hr tablet Commonly known as: Keppra XR Take 1 tab in AM, 2 tabs in PM. Route: per tube What changed: additional instructions   levothyroxine 150 MCG tablet Commonly known as: SYNTHROID Place 1 tablet (150 mcg total) into feeding tube daily before breakfast. What changed: how to take this   OXYGEN Inhale 3 L into the lungs as directed. With CPAP   phenytoin 200 MG ER capsule Commonly known as: DILANTIN Place 1 capsule (200 mg total) into feeding tube 2 (two) times daily. What changed: how to take this   polyethylene glycol powder 17 GM/SCOOP powder Commonly known as: MiraLax Take 17 g by mouth 3 (three) times daily as needed for mild constipation.   simvastatin 40 MG tablet Commonly known as: ZOCOR Place 1 tablet (40 mg  total) into feeding tube at bedtime. Take 40 mg  by mouth daily at bedtime What changed: See the new instructions.   sodium chloride 5 % ophthalmic ointment Commonly known as: MURO 573 Place 1 application into both eyes at bedtime.   sodium chloride flush 0.9 % Soln Commonly known as: NS 10-40 mLs by Intracatheter route every 12 (twelve) hours.   sodium chloride flush 0.9 % Soln Commonly known as: NS 10 mLs by Intracatheter route as needed (flush).       Disposition and follow-up:   Ms.Kaitlyn Powell was discharged from [facility] in Stable condition.  At the  hospital follow up visit please address:  1.  Follow-up: 1. G-tube functioning, 2. Seizures - adjust medications as needed, needs follow up appointment with neurology, 3. Anticoagulation - consider switching to Edoxaban   2.  Labs / imaging needed at time of follow-up: cbc, CMP, Phos, Mg  3.  Pending labs/ test needing follow-up: none  Follow-up Appointments: Follow-up Information    [facility] Follow up.   Why: The office will call to schedule visits for nursing, physical therapy, and occupational therapy Contact information: [address] [place], [place] [phone]       [facility] Follow up.   Specialty: PALLIATIVE CARE Why: Outpatient palliative services Contact information: [place] Hospital Course by problem list: 1.  Aspiration pneumonia - Patient presented to [facility] on 02/18 with increased O2 requirement and respiratory distress and CXR/CT scan of the chest demonstrating bilateral lower lobe consolidations concerning for aspiration pneumonia.  She was started on 5 L nasal cannula with BiPAP at night and given a dose of vancomycin and Zosyn in the ED.  She was quickly narrowed to Unasyn but restarted on Zosyn days later for total of 11 days of antibiotic therapy.  Patient's progress was complicated by her decreased p.o. intake secondary to this severe illness with protein-caloric malnutrition requiring PEG tube placement.  Tube feeds were started and the patient tolerated them well with improvement of her albumin. This complication likely contributed to her protracted respiratory recovery requiring intermittent supplemental oxygen at 2L . Patient gradually became more alert.  PT recommended SNF placement, but the family preferred home health PT/OT. The patient was discharged with supplemental oxygen and BiPAP, tube feeds, and continued on anticoagulation at  1.5 mg/kg.     Discharge Vitals:   BP 118/67 (BP Location: Right Arm)   Pulse 76   Temp 98.7 F (37.1 C) (Axillary)   Resp 20   Ht 4\' 10"  (1.473 m)   Wt 76.5 kg   SpO2 (!) 89%   BMI 35.25 kg/m   Pertinent Labs, Studies, and Procedures:  CBC Latest Ref Rng & Units 10/09/2019 10/08/2019 10/06/2019  WBC 4.0 - 10.5 K/uL 3.8(L) 3.7(L) 3.2(L)  Hemoglobin 12.0 - 15.0 g/dL 8.6(L) 9.5(L) 8.9(L)  Hematocrit 36.0 - 46.0 % 27.0(L) 29.1(L) 26.8(L)  Platelets 150 - 400 K/uL 125(L) 121(L) 97(L)   CMP Latest Ref Rng & Units 10/10/2019 10/09/2019 10/07/2019  Glucose 70 - 99 mg/dL 91 104(H) 90  BUN 8 - 23 mg/dL 12 9 7(L)  Creatinine 0.44 - 1.00 mg/dL 0.76 0.53 0.71  Sodium 135 - 145 mmol/L 135 136 138  Potassium 3.5 - 5.1 mmol/L 4.4 4.3 4.4  Chloride 98 - 111 mmol/L 95(L) 96(L) 97(L)  CO2 22 - 32 mmol/L 32  31 32  Calcium 8.9 - 10.3 mg/dL 9.1(P) 9.1(T) 0.5(W)  Total Protein 6.5 - 8.1 g/dL - - -  Total Bilirubin 0.3 - 1.2 mg/dL - - -  Alkaline Phos 38 - 126 U/L - - -  AST 15 - 41 U/L - - -  ALT 0 - 44 U/L - - -   CXR: 1. Left IJ central venous line with tip likely in the region of the right atrium via a left-sided SVC variant anatomy. Clinical correlation is recommended. No pneumothorax. 2. Bibasilar atelectasis or infiltrate.  CT Chest: Bilateral lower lobe consolidation and collapse consistent with extensive bronchopneumonia. Very small amount of layering pleural fluid.  Discharge Instructions: Discharge Instructions    Diet - low sodium heart healthy   Complete by: As directed    Discharge instructions   Complete by: As directed    You were hospitalized for Aspiration pneumonia. Thank you for allowing [place] to be part of your care.   Please follow up with your out patient doctors within 1-2 weeks  Please see your discharge summary for a list of your medications.   Please make sure to follow up with your doctors and call if you have any additional questions.  Please call our clinic if  you have any questions or concerns, we may be able to help and keep you from a long and expensive emergency room wait. Our clinic and after hours phone number is [phone], the best time to call is Monday through Friday 9 am to 4 pm but there is always someone available 24/7 if you have an emergency. If you need medication refills please notify your pharmacy one week in advance and they will send [place] a request.   Increase activity slowly   Complete by: As directed       Signed: Dellia Cloud, MD 10/18/2019, 7:14 AM   Pager: [phone]

## 2019-10-20 ENCOUNTER — Telehealth: Payer: Self-pay | Admitting: Neurology

## 2019-10-20 MED ORDER — LEVETIRACETAM 100 MG/ML PO SOLN: ORAL | 12 refills | Status: DC

## 2019-10-20 NOTE — Telephone Encounter (Signed)
Patient's daughter called and said she needs an urgent prescription for Keppra XR 500 MG by suspension because patient has a PICC tube due to an infection.  [facility] [employer] [facility] [address]

## 2019-10-20 NOTE — Telephone Encounter (Signed)
Pls let sister know there is no extended-release suspension, I have sent in for [facility] [address], take 64mL in AM, 64mL in PM. Thanks

## 2019-10-20 NOTE — Telephone Encounter (Signed)
Pt sister called no answer voice mail left for her to call the office back

## 2019-10-23 ENCOUNTER — Telehealth: Payer: Self-pay

## 2019-10-23 NOTE — Telephone Encounter (Signed)
Per DPR left a voice mail there is no extended-release suspension, Dr Karel Jarvis has sent in for [facility] on [address], take 73mL in AM, 2mL in PM. Any questions please call the office back

## 2019-10-23 NOTE — Telephone Encounter (Signed)
Pls let her know there is no direct conversion to the liquid form, is it possible that they just crush the medication and put in the PEG tube? Thanks

## 2019-10-23 NOTE — Telephone Encounter (Signed)
Pt sister is asking if the Carbidopa-levodopa can be called in as a liquid as well?

## 2019-10-23 NOTE — Telephone Encounter (Signed)
Pt sister called and informed that there is no direct conversion to the liquid form,  crush the medication and put in the PEG tube

## 2019-10-27 ENCOUNTER — Other Ambulatory Visit: Payer: Self-pay

## 2019-10-27 ENCOUNTER — Other Ambulatory Visit: Payer: Medicare Other

## 2019-10-27 DIAGNOSIS — R569 Unspecified convulsions: Secondary | ICD-10-CM

## 2019-10-30 ENCOUNTER — Other Ambulatory Visit: Payer: Self-pay

## 2019-10-30 DIAGNOSIS — R569 Unspecified convulsions: Secondary | ICD-10-CM

## 2019-11-06 ENCOUNTER — Ambulatory Visit (INDEPENDENT_AMBULATORY_CARE_PROVIDER_SITE_OTHER): Payer: Medicare Other | Admitting: *Deleted

## 2019-11-06 ENCOUNTER — Telehealth: Payer: Self-pay

## 2019-11-06 DIAGNOSIS — I442 Atrioventricular block, complete: Secondary | ICD-10-CM | POA: Diagnosis not present

## 2019-11-06 LAB — CUP PACEART REMOTE DEVICE CHECK
Battery Remaining Longevity: 150 mo
Battery Remaining Percentage: 100 %
Brady Statistic RA Percent Paced: 14 %
Brady Statistic RV Percent Paced: 7 %
Date Time Interrogation Session: 20210402202600
Implantable Lead Implant Date: 20170508
Implantable Lead Implant Date: 20170508
Implantable Lead Location: 753859
Implantable Lead Location: 753860
Implantable Lead Model: 7740
Implantable Lead Model: 7741
Implantable Lead Serial Number: 1111
Implantable Lead Serial Number: 1111
Implantable Pulse Generator Implant Date: 20170508
Lead Channel Impedance Value: 592 Ohm
Lead Channel Impedance Value: 628 Ohm
Lead Channel Pacing Threshold Amplitude: 1 V
Lead Channel Pacing Threshold Pulse Width: 0.4 ms
Lead Channel Setting Pacing Amplitude: 2 V
Lead Channel Setting Pacing Amplitude: 5 V
Lead Channel Setting Pacing Pulse Width: 1 ms
Lead Channel Setting Sensing Sensitivity: 2.5 mV
Pulse Gen Serial Number: 747619

## 2019-11-06 NOTE — Telephone Encounter (Signed)
Spoke to pt sister asking if she could come in to have  Phenytoin level drawn, stated that she was going to get Mrs Bristol an appointment and the day of her appointment they would have the lab work done,

## 2019-11-13 ENCOUNTER — Ambulatory Visit (INDEPENDENT_AMBULATORY_CARE_PROVIDER_SITE_OTHER): Payer: Medicare Other | Admitting: Pulmonary Disease

## 2019-11-13 ENCOUNTER — Ambulatory Visit (INDEPENDENT_AMBULATORY_CARE_PROVIDER_SITE_OTHER): Payer: Medicare Other

## 2019-11-13 ENCOUNTER — Other Ambulatory Visit: Payer: Self-pay

## 2019-11-13 ENCOUNTER — Encounter: Payer: Self-pay | Admitting: Pulmonary Disease

## 2019-11-13 ENCOUNTER — Other Ambulatory Visit (HOSPITAL_COMMUNITY): Payer: Self-pay

## 2019-11-13 VITALS — BP 118/70 | HR 62 | Temp 98.2°F | Ht <= 58 in | Wt 156.2 lb

## 2019-11-13 DIAGNOSIS — R1310 Dysphagia, unspecified: Secondary | ICD-10-CM

## 2019-11-13 DIAGNOSIS — R633 Feeding difficulties, unspecified: Secondary | ICD-10-CM

## 2019-11-13 DIAGNOSIS — J690 Pneumonitis due to inhalation of food and vomit: Secondary | ICD-10-CM | POA: Diagnosis not present

## 2019-11-13 NOTE — Progress Notes (Signed)
Please let patient family know CXR is improved. Previous opacity has resolved. Some persistent vascular congestion which is likely fluid. She has a previous BQ patient, needs to establish with new provider if not already done so.

## 2019-11-13 NOTE — Progress Notes (Signed)
Subjective:   PATIENT ID: Kaitlyn Powell GENDER: female DOB: Nov 24, 1955, MRN: [id number]   HPI  Chief Complaint  Patient presents with  . Hospitalization Follow-up    Patient's family reports she's doing much better since hospital stay. She reports that she's off of daytime oxygen.     Reason for Visit: Follow-up for recent hospitalization  [name] is a 64 year old with Down syndrome, Parkinson's disease and OSA on BiPAP who presents for follow-up. Accompanied by her older sister Kaitlyn Powell and husband Research scientist (medical) employee at cath lab).  Records including discharge summary on 10/12/19 by Dr. Dellia Cloud and Pulmonary consult notes from this admission reviewed. Patient was admitted for aspiration pneumonia and treated with prolonged course of antibiotics. Pulmonary hygiene and tolerance of NIV complicated her acute hypoxemic respiratory failure. She had PEG tube placed.  She has been NPO for the last 6 weeks. Since being discharged, her respiratory status has significantly improved and no longer requires oxygen with saturations >90% at home. She is also tolerating her NIV for her OSA without issue. Family feels she is at baseline and overall doing well.  I have personally reviewed patient's past medical/family/social history, allergies, current medications.  Past Medical History:  Diagnosis Date  . Arthritis   . Aspiration pneumonia (HCC)   . Asthma   . Cardiac arrhythmia    have pacemeaker  . Down syndrome   . DVT of popliteal vein (HCC) 09/08/2018   chronic  . GERD (gastroesophageal reflux disease)   . Heart block   . Hypothyroidism   . Mental retardation   . Pacemaker   . Pulmonary emboli (HCC)   . Seizures (HCC)   . Sleep apnea with use of continuous positive airway pressure (CPAP)      Family History  Problem Relation Age of Onset  . Diabetes Father   . Heart disease Father   . Breast cancer Sister      Social History   Occupational History  . Occupation:  disabled  Tobacco Use  . Smoking status: Never Smoker  . Smokeless tobacco: Never Used  Substance and Sexual Activity  . Alcohol use: No  . Drug use: No  . Sexual activity: Never    Allergies  Allergen Reactions  . Cephalexin Other (See Comments)    Seizures   . Namenda [Memantine Hcl] Other (See Comments)    Made memory issues worse, sleepiness, increased confusion     Outpatient Medications Prior to Visit  Medication Sig Dispense Refill  . albuterol (PROVENTIL) (2.5 MG/3ML) 0.083% nebulizer solution Take 3 mLs (2.5 mg total) by nebulization every 4 (four) hours as needed for wheezing or shortness of breath. 540 mL 0  . carbidopa-levodopa (SINEMET IR) 25-100 MG tablet Place 1 tablet into feeding tube 3 (three) times daily. Take 1.5 tablet three times a day with meals 405 tablet 3  . Elastic Bandages & Supports (GAIT/TRANSFER BELT) MISC 1 Product by Does not apply route daily. 1 each 3  . enoxaparin (LOVENOX) 80 MG/0.8ML injection Inject 0.8 mLs (80 mg total) into the skin every 12 (twelve) hours. 48 mL 0  . furosemide (LASIX) 20 MG tablet Place 1 tablet (20 mg total) into feeding tube daily as needed for fluid or edema (to maintain dry weight.). 30 tablet 11  . ipratropium (ATROVENT) 0.02 % nebulizer solution Take 2.5 mLs (0.5 mg total) by nebulization every 4 (four) hours as needed for wheezing or shortness of breath. 450 mL 0  . levETIRAcetam (  KEPPRA) 100 MG/ML solution Give 65mL (500mg ) in AM, 3mL (1000mg ) in PM 473 mL 12  . levothyroxine (SYNTHROID) 150 MCG tablet Place 1 tablet (150 mcg total) into feeding tube daily before breakfast. 30 tablet 0  . OXYGEN Inhale 3 L into the lungs as directed. With CPAP     . phenytoin (DILANTIN) 200 MG ER capsule Place 1 capsule (200 mg total) into feeding tube 2 (two) times daily. 180 capsule 3  . polyethylene glycol powder (MIRALAX) powder Take 17 g by mouth 3 (three) times daily as needed for mild constipation. (Patient not taking: Reported  on 09/22/2019) 255 g 11  . simvastatin (ZOCOR) 40 MG tablet Place 1 tablet (40 mg total) into feeding tube at bedtime. Take 40 mg  by mouth daily at bedtime 30 tablet 0  . sodium chloride (MURO 128) 5 % ophthalmic ointment Place 1 application into both eyes at bedtime.     No facility-administered medications prior to visit.    Review of Systems  Constitutional: Negative for chills, diaphoresis, fever, malaise/fatigue and weight loss.  HENT: Negative for congestion, ear pain and sore throat.   Respiratory: Negative for cough, hemoptysis, sputum production, shortness of breath and wheezing.   Cardiovascular: Negative for chest pain, palpitations and leg swelling.  Gastrointestinal: Negative for abdominal pain, heartburn and nausea.  Genitourinary: Negative for frequency.  Musculoskeletal: Negative for joint pain and myalgias.  Skin: Negative for itching and rash.  Neurological: Negative for dizziness, weakness and headaches.  Endo/Heme/Allergies: Does not bruise/bleed easily.  Psychiatric/Behavioral: Negative for depression. The patient is not nervous/anxious.      Objective:   Vitals:   11/13/19 1415  BP: 118/70  Pulse: 62  Temp: 98.2 F (36.8 C)  TempSrc: Temporal  SpO2: 98%  Weight: 156 lb 3.2 oz (70.9 kg)  Height: 4\' 10"  (1.473 m)      Physical Exam: General: Chronically ill-appearing, pleasant female, Down syndrome features, no acute distress HENT: [place], AT Eyes: EOMI, no scleral icterus Respiratory: Clear to auscultation bilaterally.  No crackles, wheezing or rales Cardiovascular: RRR, -M/R/G, no JVD GI: BS+, soft, nontender Extremities:-Edema,-tenderness Neuro: AAO x4, CNII-XII grossly intact Skin: Intact, no rashes or bruising Psych: Normal mood, normal affect  Data Reviewed:  Imaging: CT Chest 09/21/19 - Bibasilar consolidation with air bronchograms  PFT: None on file  Sleep Studies: PSG 09/10/15 - AHI 12.8. CPAP Titration 03/03/17 - Recommend CPAP 14  cmH20    Assessment & Plan:   Discussion:  Acute hypoxemic respiratory failure secondary to aspiration pneumonia - off oxygen --Continue supplemental oxygen as needed for saturations <88% --Continue aspiration precautions as she remains high risk for orofacial weakness related to DS and Parkinson's  OSA on CPAP --Continue CPAP nightly  History of aspiration s/p PEG tube --Swallow evaluation ordered --Referral to Interventional Radiology to discuss long-term tube placement  Health Maintenance Immunization History  Administered Date(s) Administered  . Influenza Inj Mdck Quad Pf 04/25/2019  . Influenza Split 05/03/2016, 04/16/2017  . Influenza, Quadrivalent, Recombinant, Inj, Pf 04/25/2019  . Influenza, Seasonal, Injecte, Preservative Fre 05/03/2016, 04/16/2017, 05/31/2017  . Influenza,inj,Quad PF,6+ Mos 04/22/2017, 05/02/2018  . Influenza-Unspecified 05/03/2016, 05/02/2018  . Pneumococcal Polysaccharide-23 07/19/2017  . Pneumococcal-Unspecified 07/19/2017  . Tdap 11/01/2014   CT Lung Screen - not qualified  Orders Placed This Encounter  Procedures  . Ambulatory referral to Interventional Radiology    Referral Priority:   Routine    Referral Type:   Consultation    Referral Reason:   Specialty  Services Required    Requested Specialty:   Interventional Radiology    Number of Visits Requested:   1  . SLP modified barium swallow    Standing Status:   Future    Standing Expiration Date:   11/12/2020    Order Specific Question:   Where should this test be performed:    Answer:   [facility]    Order Specific Question:   Please indicate reason for Referral:    Answer:   Concerned about Dysphagia/Aspiration    Order Specific Question:   Patients current diet consistency:    Answer:   Regular    Order Specific Question:   Patients current liquid consistency:    Answer:   Thin (All Liquid Allowed)    Order Specific Question:   Existing signs/symptoms of possible  Aspiration/Dysphagia:    Answer:   Sensation of food sticking in throat    Order Specific Question:   Other risk factors for Dysphagia:    Answer:   Chronic Pulmonary issues (COPD,Trach)  No orders of the defined types were placed in this encounter.   Return in about 6 months (around 05/14/2020).  I have spent a total time of 35-minutes on the day of the appointment reviewing prior documentation, coordinating care and discussing medical diagnosis and plan with the patient/family. Imaging, labs and tests included in this note have been reviewed and interpreted independently by me.  Alfrieda Tarry Mechele Collin, MD [facility] 11/13/2019 1:33 PM  Office Number [phone]

## 2019-11-13 NOTE — Patient Instructions (Signed)
History of aspiration s/p PEG tube --Swallow evaluation ordered --Referral to Interventional Radiology to discuss long-term tube placement  Follow-up with me in 6 months

## 2019-11-17 LAB — CBC
HCT: 31.7 % — ABNORMAL LOW (ref 35.0–45.0)
Hemoglobin: 10.7 g/dL — ABNORMAL LOW (ref 11.7–15.5)
MCH: 34 pg — ABNORMAL HIGH (ref 27.0–33.0)
MCHC: 33.8 g/dL (ref 32.0–36.0)
MCV: 100.6 fL — ABNORMAL HIGH (ref 80.0–100.0)
MPV: 10.6 fL (ref 7.5–12.5)
Platelets: 229 Thousand/uL (ref 140–400)
RBC: 3.15 Million/uL — ABNORMAL LOW (ref 3.80–5.10)
RDW: 15.3 % — ABNORMAL HIGH (ref 11.0–15.0)
WBC: 8.5 Thousand/uL (ref 3.8–10.8)

## 2019-11-17 LAB — BASIC METABOLIC PANEL
BUN/Creatinine Ratio: 44 (calc) — ABNORMAL HIGH (ref 6–22)
BUN: 40 mg/dL — ABNORMAL HIGH (ref 7–25)
CO2: 27 mmol/L (ref 20–32)
Calcium: 9.7 mg/dL (ref 8.6–10.4)
Chloride: 93 mmol/L — ABNORMAL LOW (ref 98–110)
Creat: 0.91 mg/dL (ref 0.50–0.99)
Glucose, Bld: 91 mg/dL (ref 65–99)
Potassium: 4.8 mmol/L (ref 3.5–5.3)
Sodium: 131 mmol/L — ABNORMAL LOW (ref 135–146)

## 2019-11-17 LAB — PHENYTOIN LEVEL, TOTAL

## 2019-11-17 LAB — PHENOBARBITAL LEVEL: Phenobarbital, Serum: <5 mg/L — ABNORMAL LOW (ref 15.0–40.0)

## 2019-11-21 ENCOUNTER — Encounter (HOSPITAL_COMMUNITY): Payer: Medicare Other

## 2019-11-21 ENCOUNTER — Telehealth: Payer: Self-pay | Admitting: Pulmonary Disease

## 2019-11-21 ENCOUNTER — Ambulatory Visit (HOSPITAL_COMMUNITY): Payer: Medicare Other

## 2019-11-21 DIAGNOSIS — G4734 Idiopathic sleep related nonobstructive alveolar hypoventilation: Secondary | ICD-10-CM

## 2019-11-21 NOTE — Telephone Encounter (Signed)
Spoke with patient's sister [name]. She is aware that [facility] has approved the order. Will go ahead and place O2 order.   Nothing further needed at time of call.

## 2019-11-21 NOTE — Telephone Encounter (Signed)
Patient still requires oxygen. However family requests 5L concentrator. Please place and fax order to [facility].  Mechele Collin, M.D. [facility] 11/21/2019 3:18 PM

## 2019-11-21 NOTE — Telephone Encounter (Signed)
Spoke with Kaitlyn Powell and she states the pt was discharged from the hospital with 5L.She is now off of daytime oxygen.and Kaitlyn Powell would like an order to be sent to [facility] to state that pt doesn't need oxygen during the day. She is also requesting [facility] to switch out her concentrator from the 10L to [employer]. [facility] stated they needed an order for this as well. Dr. Everardo All please advise.    Assessment & Plan:   Discussion:  Acute hypoxemic respiratory failure secondary to aspiration pneumonia - off oxygen --Continue supplemental oxygen as needed for saturations <88% --Continue aspiration precautions as she remains high risk for orofacial weakness related to DS and Parkinson's  OSA on CPAP --Continue CPAP nightly  History of aspiration s/p PEG tube --Swallow evaluation ordered --Referral to Interventional Radiology to discuss long-term tube placement

## 2019-11-23 ENCOUNTER — Other Ambulatory Visit (HOSPITAL_COMMUNITY): Payer: Self-pay | Admitting: Interventional Radiology

## 2019-11-23 DIAGNOSIS — J690 Pneumonitis due to inhalation of food and vomit: Secondary | ICD-10-CM

## 2019-11-24 NOTE — Progress Notes (Signed)
Spoke with [name] and notified of results/recs. Already est with Dr Everardo All.

## 2019-11-27 ENCOUNTER — Encounter (HOSPITAL_COMMUNITY): Payer: Medicare Other

## 2019-11-28 ENCOUNTER — Other Ambulatory Visit: Payer: Self-pay

## 2019-11-28 ENCOUNTER — Other Ambulatory Visit: Payer: Self-pay | Admitting: Neurology

## 2019-11-28 ENCOUNTER — Ambulatory Visit (HOSPITAL_COMMUNITY)
Admission: RE | Admit: 2019-11-28 | Discharge: 2019-11-28 | Disposition: A | Discharge status: Home / Self Care | Payer: Medicare Other | Source: Ambulatory Visit | Attending: Pulmonary Disease | Admitting: Pulmonary Disease

## 2019-11-28 DIAGNOSIS — R633 Feeding difficulties, unspecified: Secondary | ICD-10-CM

## 2019-11-28 DIAGNOSIS — R1310 Dysphagia, unspecified: Secondary | ICD-10-CM

## 2019-11-29 ENCOUNTER — Ambulatory Visit: Payer: Medicare Other | Admitting: Podiatry

## 2019-11-29 ENCOUNTER — Telehealth: Payer: Self-pay | Admitting: Pulmonary Disease

## 2019-11-29 ENCOUNTER — Other Ambulatory Visit: Payer: Self-pay | Admitting: Neurology

## 2019-11-29 MED ORDER — EPIDIOLEX 100 MG/ML PO SOLN: ORAL | 5 refills | Status: DC

## 2019-11-29 NOTE — Progress Notes (Addendum)
Modified Barium Swallow Progress Note  Patient Details  Name: Kaitlyn Powell MRN: [id number] Date of Birth: October 18, 1955  Today's Date: 11/28/2019  Modified Barium Swallow completed.  Full report located under Chart Review in the Imaging Section.  Brief recommendations include the following:  Clinical Impression  Pt exhibited mild oropharyngeal dysphagia across various consistencies/textures. Oral phase marked by reduced lingual strength, inefficient mastication with solid texture, decreased cohesion leading to prolonged transit and minimal sublingual and lingual residue. Oral cavity was cleared effectively. Pharyngeal phase of swallow remarkable to minimial delayed initiation with thin barium filling pyriform sinuses prior to adequate laryngeal protection. One instance of trace laryngeal penetration to vocal cords silently with thin via straw. Cracker filled vallecuale after initial swallow and cleared with additional puree bolus. Given reduced oral cohesion, tendancy for large sip size and decreased timing of swallow, recommend pt initiate nectar thick liquids, puree texture, pills via PEG, full supervision. Suspect thick liquids will be longer term for [facility].     Swallow Evaluation Recommendations       SLP Diet Recommendations: Dysphagia 1 (Puree) solids;Nectar thick liquid   Liquid Administration via: Cup   Medication Administration: Crushed with puree   Supervision: Patient able to self feed;Full supervision/cueing for compensatory strategies   Compensations: Slow rate;Minimize environmental distractions;Small sips/bites;Lingual sweep for clearance of pocketing;Clear throat intermittently   Postural Changes: Seated upright at 90 degrees   Oral Care Recommendations: Oral care BID        Royce Macadamia 11/29/2019,8:52 AM   Breck Coons Lonell Face.Ed Nurse, children's [phone] Office [phone]

## 2019-11-30 ENCOUNTER — Other Ambulatory Visit (HOSPITAL_COMMUNITY): Payer: Medicare Other

## 2019-11-30 NOTE — Telephone Encounter (Signed)
The Cpap supply form was signed by Dr. Everardo All 4/27 and it was faxed 4/28. I have now refaxed the form and her office note from 11/13/2019

## 2019-12-05 ENCOUNTER — Telehealth: Payer: Self-pay | Admitting: Neurology

## 2019-12-05 NOTE — Telephone Encounter (Signed)
The following message was left with [employer] on 12/05/19 at 12:21 PM.  Raynelle Fanning from [facility] called requesting a call back for clarification of the controlled substance prescription: Epidiolex.   This prescription must be faxed to: [phone].

## 2019-12-06 MED ORDER — EPIDIOLEX 100 MG/ML PO SOLN: ORAL | 5 refills | Status: DC

## 2019-12-06 NOTE — Addendum Note (Signed)
Addended by: Van Clines on: 12/06/2019 12:48 PM   Modules accepted: Orders

## 2019-12-06 NOTE — Telephone Encounter (Signed)
Script faxed to [facility]

## 2019-12-06 NOTE — Telephone Encounter (Signed)
Printed, pls fax to number below, thanks!

## 2019-12-06 NOTE — Telephone Encounter (Signed)
Anything I need to do about this? Thanks

## 2019-12-08 ENCOUNTER — Ambulatory Visit: Payer: Medicare Other | Admitting: Gastroenterology

## 2019-12-13 ENCOUNTER — Telehealth: Payer: Self-pay | Admitting: Pulmonary Disease

## 2019-12-13 DIAGNOSIS — G4733 Obstructive sleep apnea (adult) (pediatric): Secondary | ICD-10-CM

## 2019-12-13 NOTE — Telephone Encounter (Signed)
I do not see a order for a bipap only see a order for oxygen looks like we will need one

## 2019-12-13 NOTE — Telephone Encounter (Signed)
Called pt's sister [name] but unable to reach. Left message for pt to return call.

## 2019-12-14 ENCOUNTER — Ambulatory Visit (HOSPITAL_COMMUNITY)
Admission: RE | Admit: 2019-12-14 | Discharge: 2019-12-14 | Disposition: A | Discharge status: Home / Self Care | Payer: Medicare Other | Source: Ambulatory Visit | Attending: Interventional Radiology | Admitting: Interventional Radiology

## 2019-12-14 ENCOUNTER — Other Ambulatory Visit (HOSPITAL_COMMUNITY): Payer: Self-pay | Admitting: Interventional Radiology

## 2019-12-14 DIAGNOSIS — Z431 Encounter for attention to gastrostomy: Secondary | ICD-10-CM | POA: Insufficient documentation

## 2019-12-14 DIAGNOSIS — J690 Pneumonitis due to inhalation of food and vomit: Secondary | ICD-10-CM | POA: Insufficient documentation

## 2019-12-14 HISTORY — PX: IR REPLC GASTRO/COLONIC TUBE PERCUT W/FLUORO: IMG2333

## 2019-12-14 MED ORDER — LIDOCAINE VISCOUS HCL 2 % MT SOLN
OROMUCOSAL | Status: AC
  Filled 2019-12-14: qty 15

## 2019-12-14 MED ORDER — IODIXANOL 320 MG/ML IV SOLN
INTRAVENOUS | Status: DC | PRN
  Administered 2019-12-14: 50 mL

## 2019-12-14 MED ORDER — IOHEXOL 300 MG/ML  SOLN: 50.0000 mL | Freq: Once | INTRAMUSCULAR | Status: DC | PRN

## 2019-12-14 NOTE — Telephone Encounter (Signed)
Called and spoke with sister [name]. New DME order placed. Nothing further needed at this time.

## 2019-12-19 ENCOUNTER — Other Ambulatory Visit (HOSPITAL_COMMUNITY): Payer: Self-pay | Admitting: Interventional Radiology

## 2019-12-19 DIAGNOSIS — Z431 Encounter for attention to gastrostomy: Secondary | ICD-10-CM

## 2019-12-25 ENCOUNTER — Other Ambulatory Visit: Payer: Self-pay | Admitting: Internal Medicine

## 2020-01-04 ENCOUNTER — Ambulatory Visit (HOSPITAL_COMMUNITY)
Admission: RE | Admit: 2020-01-04 | Discharge: 2020-01-04 | Disposition: A | Discharge status: Home / Self Care | Payer: Medicare Other | Source: Ambulatory Visit | Attending: Interventional Radiology | Admitting: Interventional Radiology

## 2020-01-04 ENCOUNTER — Other Ambulatory Visit: Payer: Self-pay

## 2020-01-04 DIAGNOSIS — Z431 Encounter for attention to gastrostomy: Secondary | ICD-10-CM | POA: Insufficient documentation

## 2020-01-04 DIAGNOSIS — R1310 Dysphagia, unspecified: Secondary | ICD-10-CM | POA: Diagnosis not present

## 2020-01-04 HISTORY — PX: IR REPLC GASTRO/COLONIC TUBE PERCUT W/FLUORO: IMG2333

## 2020-01-04 MED ORDER — IOHEXOL 300 MG/ML  SOLN
50.0000 mL | Freq: Once | INTRAMUSCULAR | Status: AC | PRN
  Administered 2020-01-04: 10 mL

## 2020-01-04 MED ORDER — LIDOCAINE VISCOUS HCL 2 % MT SOLN
OROMUCOSAL | Status: AC
  Filled 2020-01-04: qty 15

## 2020-01-04 NOTE — Procedures (Signed)
Pre procedural Dx: Dysphagia, request for placement of mic key button percutaneous gastrostomy tube. Post procedural Dx: Same  Successful removal of existing 20 Fr balloon retention gastrostomy tube and replacement with 20 Fr mic key button gastrostomy tube.  Appropriate position confirmed using fluoroscopy and aspiration of gastric content.  The feeding tube is ready for immediate use.  EBL: None  Complications: None immediate.  Please see imaging section of Epic for full dictation.  Lynnette Caffey, PA-C

## 2020-01-12 ENCOUNTER — Other Ambulatory Visit: Payer: Self-pay | Admitting: Internal Medicine

## 2020-01-12 NOTE — Telephone Encounter (Signed)
Prescription refill request for Xarelto received.  Patient also on phenytoin.  Per Dr Jenel Lucks 10/19/18 note, patient's caregivers aware of interaction.   "The patient and family are clear that they would not want to start xarelto 15mg  BID as previously ordered but would be willing to try xarelto 20mg  daily. We discussed potential reduced efficacity when combined with phenytoin. They are clear that coumadin is NOT an option. I will therefore start xarelto 20mg  daily today. If not on phenytoin, I would lean to 10mg  daily eventually (Like in 3 months). I am not sure and will defer to Dr in the setting of phenytoin." Indication: Hx of DVT.   Last office visit: Weight: 70.9 kg Age: 64 Scr: 0.91 CrCl: 71 mL/min

## 2020-01-23 ENCOUNTER — Other Ambulatory Visit: Payer: Self-pay | Admitting: Interventional Radiology

## 2020-01-23 DIAGNOSIS — Z931 Gastrostomy status: Secondary | ICD-10-CM

## 2020-02-07 ENCOUNTER — Other Ambulatory Visit: Payer: Self-pay

## 2020-02-07 ENCOUNTER — Ambulatory Visit (INDEPENDENT_AMBULATORY_CARE_PROVIDER_SITE_OTHER): Payer: Medicare Other | Admitting: *Deleted

## 2020-02-07 ENCOUNTER — Other Ambulatory Visit (INDEPENDENT_AMBULATORY_CARE_PROVIDER_SITE_OTHER): Payer: Medicare Other

## 2020-02-07 ENCOUNTER — Ambulatory Visit
Admission: RE | Admit: 2020-02-07 | Discharge: 2020-02-07 | Disposition: A | Discharge status: Home / Self Care | Payer: Medicare Other | Source: Ambulatory Visit | Attending: Interventional Radiology | Admitting: Interventional Radiology

## 2020-02-07 ENCOUNTER — Encounter: Payer: Self-pay | Admitting: *Deleted

## 2020-02-07 DIAGNOSIS — Z79899 Other long term (current) drug therapy: Secondary | ICD-10-CM | POA: Diagnosis not present

## 2020-02-07 DIAGNOSIS — G40211 Localization-related (focal) (partial) symptomatic epilepsy and epileptic syndromes with complex partial seizures, intractable, with status epilepticus: Secondary | ICD-10-CM

## 2020-02-07 DIAGNOSIS — R569 Unspecified convulsions: Secondary | ICD-10-CM

## 2020-02-07 DIAGNOSIS — I442 Atrioventricular block, complete: Secondary | ICD-10-CM | POA: Diagnosis not present

## 2020-02-07 DIAGNOSIS — R410 Disorientation, unspecified: Secondary | ICD-10-CM

## 2020-02-07 DIAGNOSIS — Z931 Gastrostomy status: Secondary | ICD-10-CM

## 2020-02-07 HISTORY — PX: IR RADIOLOGIST EVAL & MGMT: IMG5224

## 2020-02-07 LAB — COMPREHENSIVE METABOLIC PANEL
ALT: 4 U/L (ref 0–35)
AST: 16 U/L (ref 0–37)
Albumin: 3.7 g/dL (ref 3.5–5.2)
Alkaline Phosphatase: 118 U/L — ABNORMAL HIGH (ref 39–117)
BUN: 19 mg/dL (ref 6–23)
CO2: 28 mEq/L (ref 19–32)
Calcium: 8.6 mg/dL (ref 8.4–10.5)
Chloride: 97 mEq/L (ref 96–112)
Creatinine, Ser: 0.84 mg/dL (ref 0.40–1.20)
GFR: 68.32 mL/min (ref >60.00)
Glucose, Bld: 86 mg/dL (ref 70–99)
Potassium: 4.1 mEq/L (ref 3.5–5.1)
Sodium: 131 mEq/L — ABNORMAL LOW (ref 135–145)
Total Bilirubin: 0.4 mg/dL (ref 0.2–1.2)
Total Protein: 8.1 g/dL (ref 6.0–8.3)

## 2020-02-07 LAB — HEPATIC FUNCTION PANEL
ALT: 4 U/L (ref 0–35)
AST: 18 U/L (ref 0–37)
Albumin: 3.8 g/dL (ref 3.5–5.2)
Alkaline Phosphatase: 121 U/L — ABNORMAL HIGH (ref 39–117)
Bilirubin, Direct: 0.1 mg/dL (ref 0.0–0.3)
Total Bilirubin: 0.4 mg/dL (ref 0.2–1.2)
Total Protein: 8.8 g/dL — ABNORMAL HIGH (ref 6.0–8.3)

## 2020-02-07 LAB — CBC
HCT: 34.2 % — ABNORMAL LOW (ref 36.0–46.0)
Hemoglobin: 11.8 g/dL — ABNORMAL LOW (ref 12.0–15.0)
MCHC: 34.5 g/dL (ref 30.0–36.0)
MCV: 108.3 fl — ABNORMAL HIGH (ref 78.0–100.0)
Platelets: 128 10*3/uL — ABNORMAL LOW (ref 150.0–400.0)
RBC: 3.16 Mil/uL — ABNORMAL LOW (ref 3.87–5.11)
RDW: 13.6 % (ref 11.5–15.5)
WBC: 4.9 10*3/uL (ref 4.0–10.5)

## 2020-02-07 NOTE — Progress Notes (Signed)
Chief Complaint: Patient was seen in follow-up today for gastrostomy tube evaluation at the request of Kaitlyn Powell  Referring Physician(s): Kaitlyn Powell  History of Present Illness: Kaitlyn Powell is a 64 y.o. female with Down syndrome, repeated aspiration and aspiration pneumonia.  She recently underwent exchange of an existing balloon retention percutaneous gastrostomy tube for a 3 cm stoma length low-profile Mickey button percutaneous gastrostomy tube.  She presents today with her parents who have some questions about the tube.  Thus far, the tube has been working very well for them.  They appreciate the low-profile design and have had no troubles with feeding.  They are concerned about some mild discharge from around the tube insertion site as well as some mild redness of the surrounding skin.  Additionally, they have questions regarding whether or not she could go swimming at the swimming pool if they cover the gastrostomy tube.  They report that she has had no fever, chills and no complaints of pain or discomfort associated with the tube.  Past Medical History:  Diagnosis Date  . Arthritis   . Aspiration pneumonia (HCC)   . Asthma   . Cardiac arrhythmia    have pacemeaker  . Down syndrome   . DVT of popliteal vein (HCC) 09/08/2018   chronic  . GERD (gastroesophageal reflux disease)   . Heart block   . Hypothyroidism   . Mental retardation   . Pacemaker   . Pulmonary emboli (HCC)   . Seizures (HCC)   . Sleep apnea with use of continuous positive airway pressure (CPAP)     Past Surgical History:  Procedure Laterality Date  . IR GASTROSTOMY TUBE MOD SED  10/04/2019  . IR RADIOLOGIST EVAL & MGMT  02/07/2020  . IR REPLC GASTRO/COLONIC TUBE PERCUT W/FLUORO  12/14/2019  . IR REPLC GASTRO/COLONIC TUBE PERCUT W/FLUORO  01/04/2020  . PACEMAKER INSERTION  12/09/2015   [employer] Accolade MRI EL PPM implanted in PA for complete heart block and syncope  . PEG TUBE  PLACEMENT     removed in 2017  . TIBIA FRACTURE SURGERY Right     Allergies: Cephalexin and Namenda [memantine hcl]  Medications: Prior to Admission medications   Medication Sig Start Date End Date Taking? Authorizing Provider  albuterol (PROVENTIL) (2.5 MG/3ML) 0.083% nebulizer solution Take 3 mLs (2.5 mg total) by nebulization every 4 (four) hours as needed for wheezing or shortness of breath. 10/12/19 11/13/19  Dellia Cloud, MD  cannabidiol (EPIDIOLEX) 100 MG/ML solution Give 3.7 mL twice a day 12/06/19   Van Clines, MD  carbidopa-levodopa (SINEMET IR) 25-100 MG tablet Place 1 tablet into feeding tube 3 (three) times daily. Take 1.5 tablet three times a day with meals 10/12/19   Dellia Cloud, MD  Elastic Bandages & Supports (GAIT/TRANSFER BELT) MISC 1 Product by Does not apply route daily. 04/03/19   Van Clines, MD  furosemide (LASIX) 20 MG tablet Place 1 tablet (20 mg total) into feeding tube daily as needed for fluid or edema (to maintain dry weight.). 10/12/19 10/11/20  Dellia Cloud, MD  ipratropium (ATROVENT) 0.02 % nebulizer solution Take 2.5 mLs (0.5 mg total) by nebulization every 4 (four) hours as needed for wheezing or shortness of breath. 10/12/19 11/13/19  Dellia Cloud, MD  levETIRAcetam (KEPPRA) 100 MG/ML solution Give 87mL (500mg ) in AM, 35mL (1000mg ) in PM 10/20/19   , MD  levothyroxine (SYNTHROID) 150 MCG tablet Place 1 tablet (150 mcg total) into feeding tube daily before breakfast.  10/12/19 11/13/19  Dellia Cloud, MD  OXYGEN Inhale 3 L into the lungs as directed. With CPAP     [provider]  phenytoin (DILANTIN) 200 MG ER capsule TAKE 1 CAPSULE BY MOUTH TWICE A DAY 11/28/19   Van Clines, MD  simvastatin (ZOCOR) 40 MG tablet TAKE 40 MG BY MOUTH DAILY AT BEDTIME 12/25/19   Allred, Fayrene Fearing, MD  sodium chloride (MURO 128) 5 % ophthalmic ointment Place 1 application into both eyes at bedtime.    [provider]  XARELTO 20 MG TABS tablet TAKE  1 TABLET (20 MG TOTAL) BY MOUTH DAILY WITH SUPPER. 01/12/20   Hillis Range, MD     Family History  Problem Relation Age of Onset  . Diabetes Father   . Heart disease Father   . Breast cancer Sister     Social History   Socioeconomic History  . Marital status: Single    Spouse name: Not on file  . Number of children: Not on file  . Years of education: Not on file  . Highest education level: Not on file  Occupational History  . Occupation: disabled  Tobacco Use  . Smoking status: Never Smoker  . Smokeless tobacco: Never Used  Vaping Use  . Vaping Use: Never used  Substance and Sexual Activity  . Alcohol use: No  . Drug use: No  . Sexual activity: Never  Other Topics Concern  . Not on file  Social History Narrative   Pt is left handed   Lives in 2 story home with her sister, brother-in-law and BIL's brother (pt stays on bottom floor)   Social Determinants of Health   Financial Resource Strain:   . Difficulty of Paying Living Expenses:   Food Insecurity:   . Worried About Programme researcher, broadcasting/film/video in the Last Year:   . Barista in the Last Year:   Transportation Needs:   . Freight forwarder (Medical):   Marland Kitchen Lack of Transportation (Non-Medical):   Physical Activity:   . Days of Exercise per Week:   . Minutes of Exercise per Session:   Stress:   . Feeling of Stress :   Social Connections:   . Frequency of Communication with Friends and Family:   . Frequency of Social Gatherings with Friends and Family:   . Attends Religious Services:   . Active Member of Clubs or Organizations:   . Attends Banker Meetings:   Marland Kitchen Marital Status:     Review of Systems: A 12 point ROS discussed and pertinent positives are indicated in the HPI above.  All other systems are negative.  Review of Systems  Vital Signs: There were no vitals taken for this visit.  Physical Exam Constitutional:      Appearance: Normal appearance.  HENT:     Head: Normocephalic and  atraumatic.  Eyes:     General: No scleral icterus. Pulmonary:     Effort: Pulmonary effort is normal.  Abdominal:     Palpations: Abdomen is soft.     Tenderness: There is no abdominal tenderness.     Comments: Low-profile percutaneous gastrostomy tube appears to be in good position.  There is trace fibrinous exudate about the tube insertion site as well as very mild dry erythematous scale of the epidermis where it contacts the sides of the gastrostomy tube in the seated position.  When seated, the patient's abdominal wall adipose fat bunches up and causes some overhang of the soft tissues  onto the edges of the gastrostomy tube.  Neurological:     Mental Status: She is alert.      Imaging: IR Radiologist Eval & Mgmt  Result Date: 02/07/2020 Please refer to notes tab for details about interventional procedure. (Op Note)   Labs:  CBC: Recent Labs    10/06/19 0537 10/08/19 0153 10/09/19 0354 10/27/19 1618  WBC 3.2* 3.7* 3.8* 8.5  HGB 8.9* 9.5* 8.6* 10.7*  HCT 26.8* 29.1* 27.0* 31.7*  PLT 97* 121* 125* 229    COAGS: No results for input(s): INR, APTT in the last 8760 hours.  BMP: Recent Labs    10/06/19 0537 10/06/19 0537 10/07/19 1633 10/09/19 0354 10/10/19 0430 10/27/19 1618  NA 139   < > 138 136 135 131*  K 3.8   < > 4.4 4.3 4.4 4.8  CL 100   < > 97* 96* 95* 93*  CO2 29   < > 32 31 32 27  GLUCOSE 93   < > 90 104* 91 91  BUN <5*   < > 7* 9 12 40*  CALCIUM 8.1*   < > 8.4* 8.5* 8.8* 9.7  CREATININE 0.63   < > 0.71 0.53 0.76 0.91  GFRNONAA >60  --  >60 >60 >60  --   GFRAA >60  --  >60 >60 >60  --    < > = values in this interval not displayed.    LIVER FUNCTION TESTS: Recent Labs    04/05/19 0000 09/22/19 0406 10/02/19 0320 10/04/19 0632  BILITOT <0.2 0.6  --  0.3  AST 20 12*  --  18  ALT 12 6  --  <5  ALKPHOS 98 48  --  54  PROT 7.4 5.6*  --  5.9*  ALBUMIN 3.2* 2.1* 1.6* 1.6*    TUMOR MARKERS: No results for input(s): AFPTM, CEA, CA199,  CHROMGRNA in the last 8760 hours.  Assessment and Plan:  Low-profile Mickey button percutaneous gastrostomy tube in good position and functioning well.  She has some very mild irritation on the skin surrounding the button which is likely secondary to redundancy in the adipose tissue which becomes exacerbated in the sitting position.  1.)  Boudreau's Butt paste topical barrier cream to the affected area as needed. 2.)  Instructed on appropriate surveillance for evidence of infection (foul smell, increasing redness, discomfort). 3.)  Patients family to call back at any time if symptoms develop, or if the tube becomes damaged and is in need of replacement.  I would likely opt for a longer stoma length of 3.5 cm in the future due to the redundancy of the adipose tissue in the sitting position. 4.)  Instructed on appropriate barrier creation if the patient wants to go swimming.    Electronically Signed: Malachy Moan 02/07/2020, 1:41 PM   I spent a total of 15 Minutes in face to face in clinical consultation, greater than 50% of which was counseling/coordinating care for percutaneous gastrostomy tube.

## 2020-02-08 LAB — CUP PACEART REMOTE DEVICE CHECK
Battery Remaining Longevity: 126 mo
Battery Remaining Percentage: 100 %
Brady Statistic RA Percent Paced: 15 %
Brady Statistic RV Percent Paced: 8 %
Date Time Interrogation Session: 20210707023100
Implantable Lead Implant Date: 20170508
Implantable Lead Implant Date: 20170508
Implantable Lead Location: 753859
Implantable Lead Location: 753860
Implantable Lead Model: 7740
Implantable Lead Model: 7741
Implantable Lead Serial Number: 1111
Implantable Lead Serial Number: 1111
Implantable Pulse Generator Implant Date: 20170508
Lead Channel Impedance Value: 624 Ohm
Lead Channel Impedance Value: 649 Ohm
Lead Channel Pacing Threshold Amplitude: 1 V
Lead Channel Pacing Threshold Pulse Width: 0.4 ms
Lead Channel Setting Pacing Amplitude: 2 V
Lead Channel Setting Pacing Amplitude: 5 V
Lead Channel Setting Pacing Pulse Width: 1 ms
Lead Channel Setting Sensing Sensitivity: 2.5 mV
Pulse Gen Serial Number: 747619

## 2020-02-08 LAB — PHENYTOIN LEVEL, TOTAL: Phenytoin, Total: 23.7 mg/L — ABNORMAL HIGH (ref 10.0–20.0)

## 2020-02-09 NOTE — Progress Notes (Signed)
Remote pacemaker transmission.   

## 2020-02-13 ENCOUNTER — Other Ambulatory Visit: Payer: Self-pay

## 2020-02-13 ENCOUNTER — Encounter: Payer: Self-pay | Admitting: Podiatry

## 2020-02-13 ENCOUNTER — Ambulatory Visit (INDEPENDENT_AMBULATORY_CARE_PROVIDER_SITE_OTHER): Payer: Medicare Other | Admitting: Podiatry

## 2020-02-13 DIAGNOSIS — M79674 Pain in right toe(s): Secondary | ICD-10-CM | POA: Diagnosis not present

## 2020-02-13 DIAGNOSIS — M79675 Pain in left toe(s): Secondary | ICD-10-CM

## 2020-02-13 DIAGNOSIS — B351 Tinea unguium: Secondary | ICD-10-CM

## 2020-02-13 MED ORDER — NONFORMULARY OR COMPOUNDED ITEM: 3 refills | Status: AC

## 2020-02-13 NOTE — Patient Instructions (Signed)
Apothecary [phone] Antifungal nail solution 

## 2020-02-15 ENCOUNTER — Telehealth (INDEPENDENT_AMBULATORY_CARE_PROVIDER_SITE_OTHER): Payer: Medicare Other | Admitting: Neurology

## 2020-02-15 ENCOUNTER — Other Ambulatory Visit: Payer: Self-pay

## 2020-02-15 ENCOUNTER — Encounter: Payer: Self-pay | Admitting: Neurology

## 2020-02-15 VITALS — Ht <= 58 in | Wt 159.0 lb

## 2020-02-15 DIAGNOSIS — R251 Tremor, unspecified: Secondary | ICD-10-CM | POA: Diagnosis not present

## 2020-02-15 DIAGNOSIS — Q909 Down syndrome, unspecified: Secondary | ICD-10-CM | POA: Diagnosis not present

## 2020-02-15 DIAGNOSIS — F0280 Dementia in other diseases classified elsewhere without behavioral disturbance: Secondary | ICD-10-CM | POA: Diagnosis not present

## 2020-02-15 DIAGNOSIS — G40211 Localization-related (focal) (partial) symptomatic epilepsy and epileptic syndromes with complex partial seizures, intractable, with status epilepticus: Secondary | ICD-10-CM | POA: Diagnosis not present

## 2020-02-15 IMAGING — US US ABDOMEN LIMITED
1 series · 14 of 25 positions shown · non-contrast
Comparison: Abdominopelvic CT earlier this day

CLINICAL DATA: Stated history of enlarged gallbladder. Patient
reports abdominal pain.

EXAM:
ULTRASOUND ABDOMEN LIMITED RIGHT UPPER QUADRANT

[Series 1: us abdomen limited · 0.23mm/px · 14 of 78 slices shown]
[im 1/78]
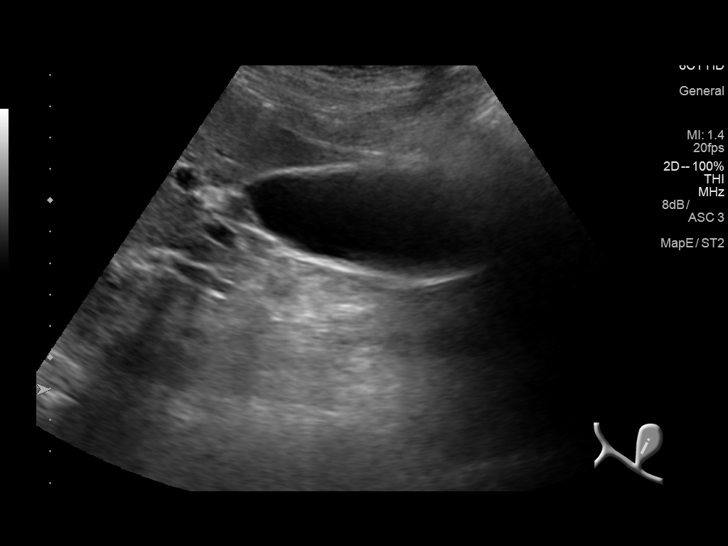
[im 7/78]
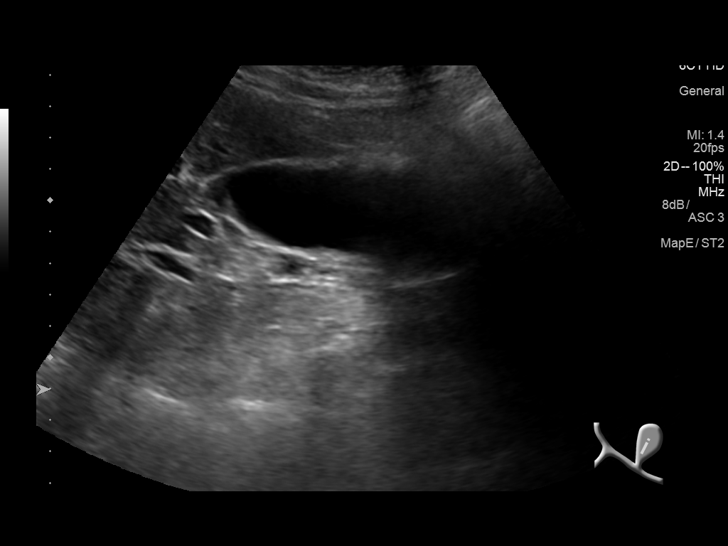
[im 13/78]
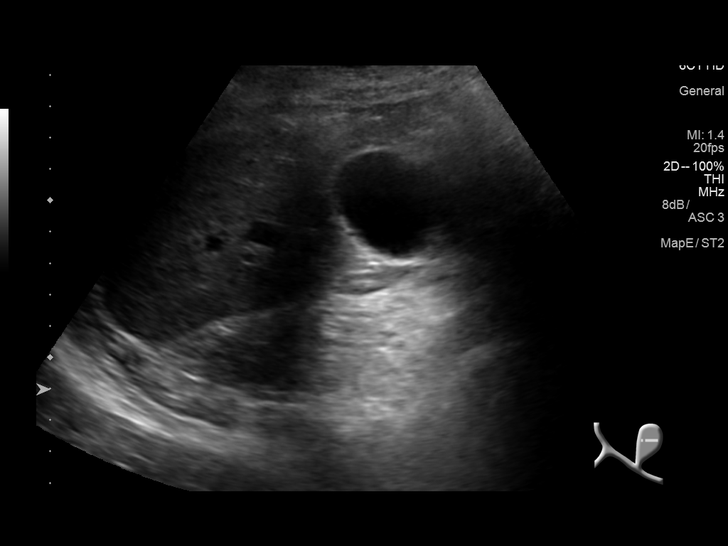
[im 20/78]
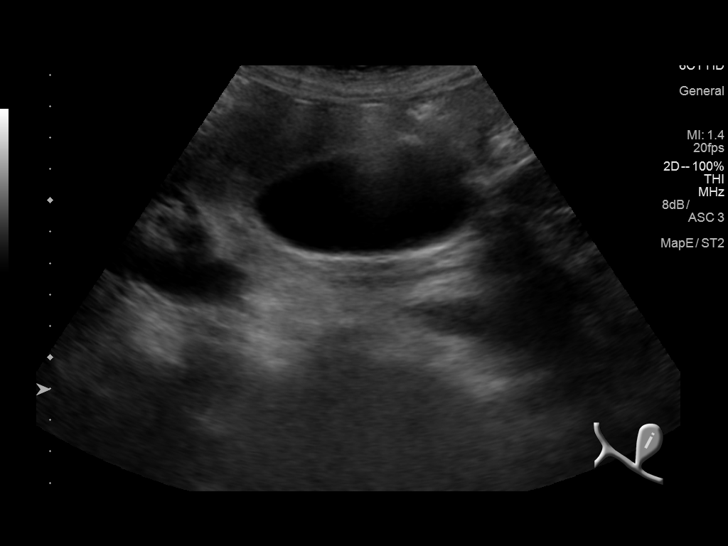
[im 26/78]
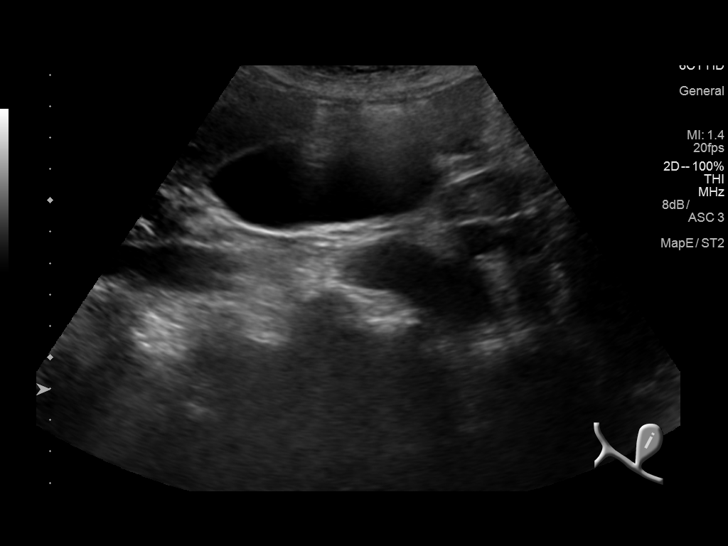
[im 29/78]
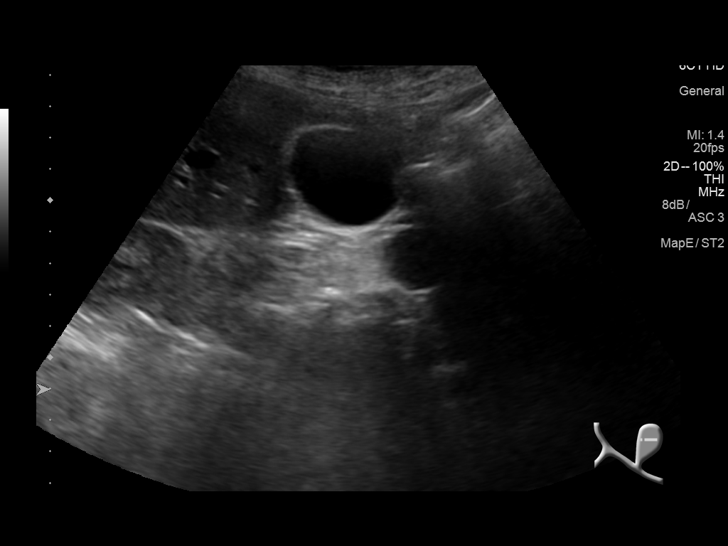
[im 36/78]
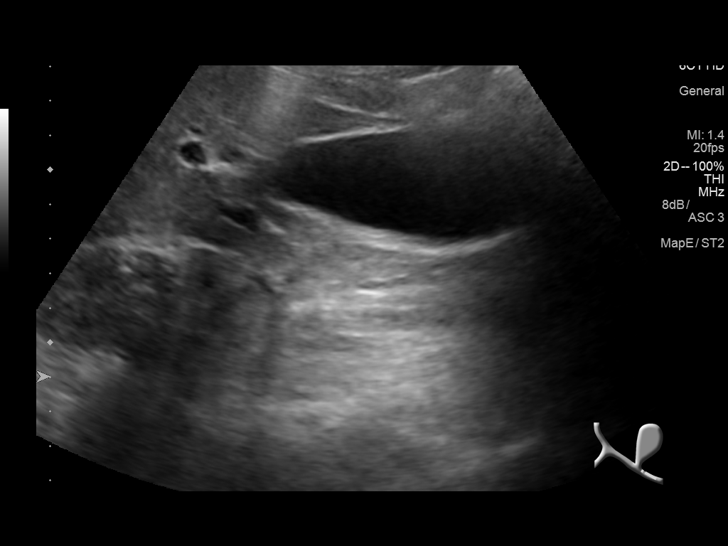
[im 42/78]
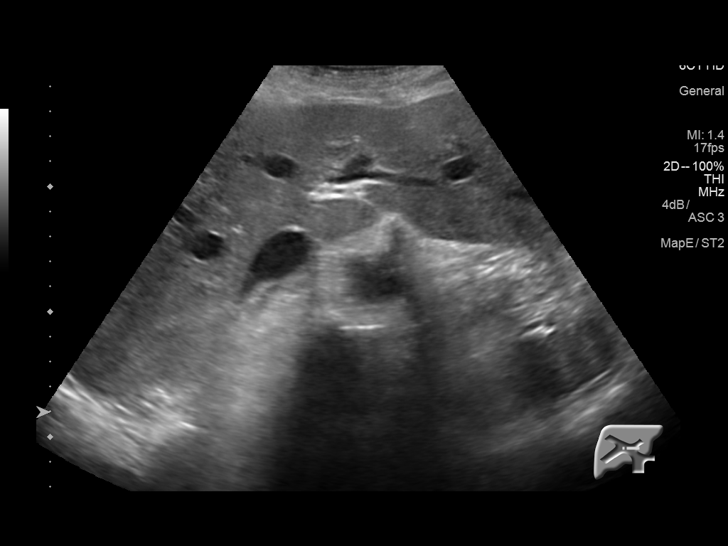
[im 49/78]
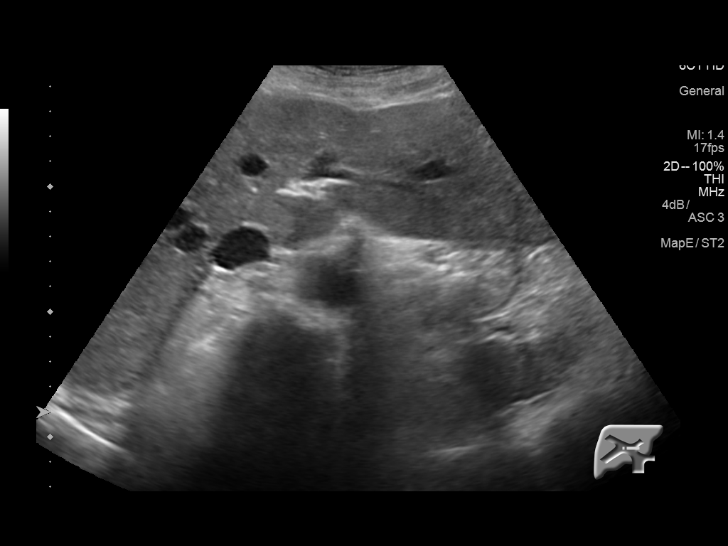
[im 52/78]
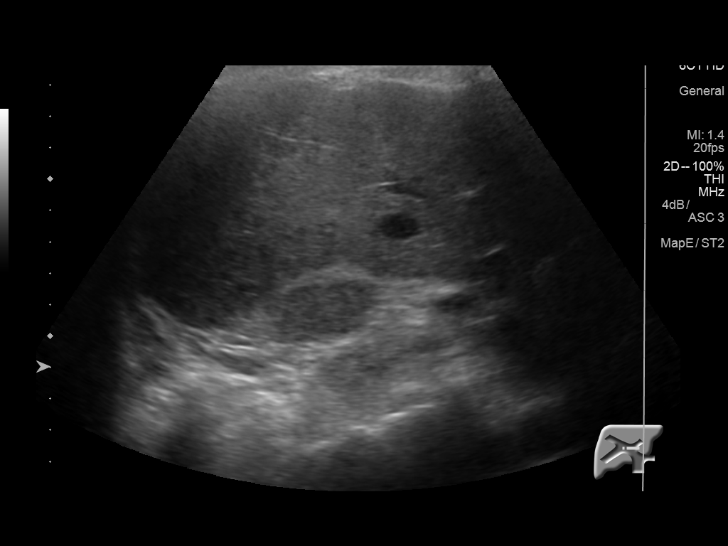
[im 58/78]
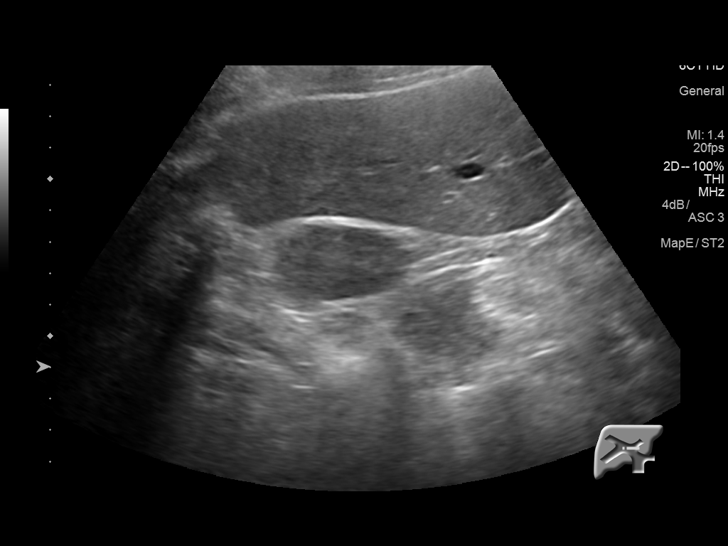
[im 65/78]
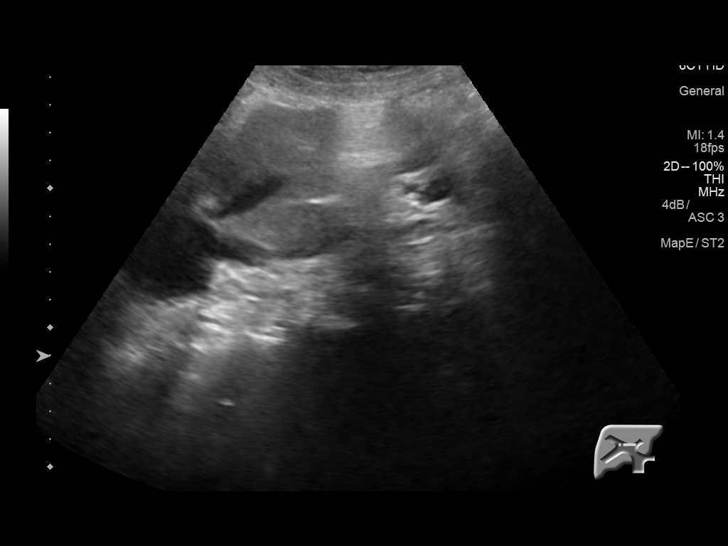
[im 71/78]
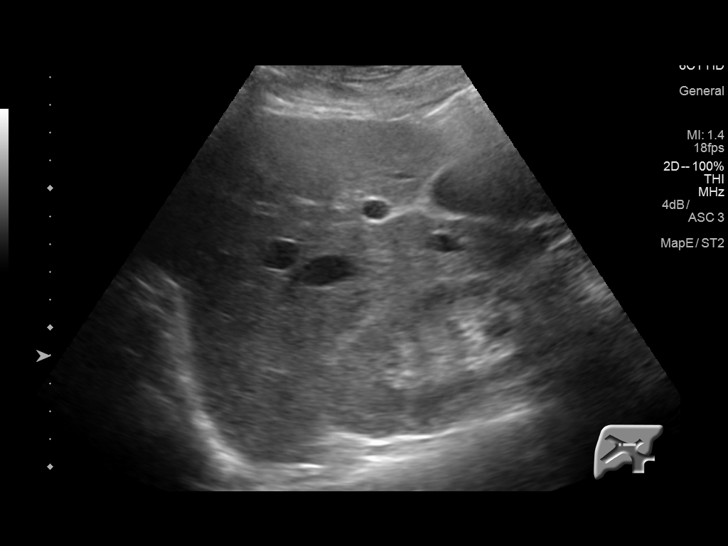
[im 78/78]
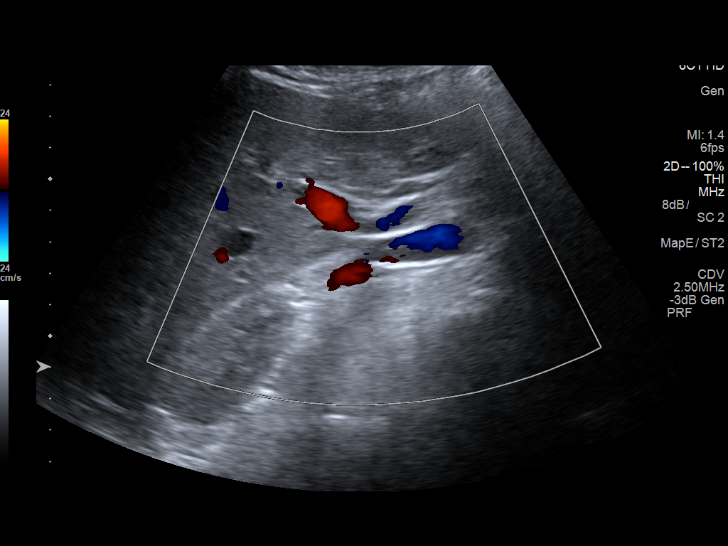

[14 of 25 positions shown; findings below may reference images not displayed]

FINDINGS: Gallbladder:

Physiologically distended. No gallstones or wall thickening
visualized. No sonographic Murphy sign noted by sonographer.

Common bile duct:

Diameter: 5 mm, normal.

Liver:

No focal lesion identified. Within normal limits in parenchymal
echogenicity. Portal vein is patent on color Doppler imaging with
normal direction of blood flow towards the liver.
IMPRESSION: Unremarkable right upper quadrant ultrasound. Particularly, normal
sonographic appearance of the gallbladder.

## 2020-02-15 MED ORDER — EPIDIOLEX 100 MG/ML PO SOLN: ORAL | 5 refills | Status: DC

## 2020-02-15 MED ORDER — PHENYTOIN 125 MG/5ML PO SUSP: ORAL | 11 refills | Status: DC

## 2020-02-15 MED ORDER — LEVETIRACETAM 100 MG/ML PO SOLN: ORAL | 11 refills | Status: DC

## 2020-02-15 MED ORDER — CARBIDOPA-LEVODOPA 25-100 MG PO TABS: 2.0000 | ORAL_TABLET | Freq: Three times a day (TID) | ORAL | 3 refills | Status: DC

## 2020-02-15 NOTE — Progress Notes (Signed)
Virtual Visit via Video Note The purpose of this virtual visit is to provide medical care while limiting exposure to the novel coronavirus.    Consent was obtained for video visit:  Yes.   Answered questions that patient had about telehealth interaction:  Yes.   I discussed the limitations, risks, security and privacy concerns of performing an evaluation and management service by telemedicine. I also discussed with the patient that there may be a patient responsible charge related to this service. The patient expressed understanding and agreed to proceed.  Pt location: Home Physician Location: office Name of referring provider:  Tracey Harries, MD I connected with Kaitlyn Powell at patients initiation/request on 02/15/2020 at  2:30 PM EDT by video enabled telemedicine application and verified that I am speaking with the correct person using two identifiers. Pt MRN:  [id number] Pt DOB:  1955-12-30 Video Participants:  Kaitlyn Powell;  Kaitlyn Powell (sister)   History of Present Illness:  The patient had a virtual video visit on 02/15/2020. 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 Her sister is present to provide history. She is more awake today than prior visits, at the end of the visit, she waves. Kaitlyn Powell reports she has not been talkative today but some days is more talkative. She has not had any seizures since increasing Epidiolex in January. She was previously having multiple seizures daily. Her sister was told she had one event where something happened to her eyes, but sister did not witness it and has not seen them herself. She is on Epidiolex 3.32mL BID, Keppra 500mg  in AM, 1000mg  in PM, and Dilantin 200mg  BID (47mL BID). All her medications are through the PEG tube. She continues to go to rehab twice a week, her left leg does not go as well as the right, but her walking has been steady and good. feels her balance has been good. She is on Sinemet  25/100mg  TID for parkinsonism, she was noted to have tremor and bradykinesia on prior visits. Sleep has been okay, she has not taken melatonin for a week.   Laboratory Data: Lab Results  Component Value Date   ALT 18 02/07/2020   AST 4 02/07/2020   ALKPHOS 121 02/07/2020   BILITOT 0.1 02/07/2020   Lab Results  Component Value Date   PHENYTOIN 23.7 (H) 02/07/2020      History on Initial Assessment 09/02/2018: This is a pleasant 64 year old left-handed woman with a history of Down syndrome, sleep apnea, hypothyroidism, sick sinus syndrome s/p PPM, presenting for second opinion regarding seizures. 04/09/2020 reports that Kaitlyn Powell's seizures started in her teens/early 80s, they would be walking and all of a sudden she would drop down. She would be awake and appear to lose all muscle tone, family would pick her up and she would be fine. She had been taking Dilantin for many years. 68 reports a period of time where she was not having seizures, or at least they were not hearing any news from her group home. In 2017, she became quite ill and was treated in 38s for pneumonia, CHF, and pulmonary embolism. She was on Lovenox until November 2017. There was concern for macrocytic anemia with normal B12 level possibly due to prolonged Dilantin use, she had been on Dilantin 200mg  BID. She had an infected PEG tube and had a GTC lasting 20 minutes. She was seen by neurologist Dr. 2018 in 2018 due to drop seizures occurring on  a daily basis with loss of consciousness lasting less than a minute. She was started on Levetiracetam, however with attempt to wean down Dilantin, she had several GTCs. She was restarted on Dilantin 200mg  BID and Keppra dose was reduced. She had an MRI brain without contrast in July 2018 which showed congenital microcephaly, moderate diffuse volum loss, moderate chronic microvascular disease. Routine EEG reported moderate to severe background slowing with rare generalized spike slow waves. The  daily episodes decreased in frequency and would only occur when she was on the toilet. Initially they were occurring when she was having a BM and family could visibly see her abdomen straining, however recently they have occurred while peeing and without any visible straining. She would be out for 10-15 seconds, then wake up looking like she is trying to grab something or moving her hands L>R like she is scratching her leg. She would be talking gibberish or incoherent like baby talk when she starts coming around. A few times she would black out and go backward like someone pushed her back hard. She had a 3-day ambulatory video EEG study in November 2018. Background was diffusely slow with sharp and spike and wave discharges in the left frontotemporal region, bihemispheric, bisynchronous spike and wave discharges, isolated bursts of generalized spike and wave discharges, independent discharges in the right frontotemporal region. In general, there was a bifrontal predominance of discharges including the presence of occasional 2-2.5 slow spike and wave discharges. There was decreased amplitude over the right temporal area. There were 6 push button events, all due to "drop seizure on toilet." With 5 events, there was note of diffuse slow background with several bursts of bilateral rhythmic delta activity/intermittent delta activity. It was noted that event #6 had generalized frontally predominant 2 Hz spike and wave activity lasting 17 seconds with some after coming slow waves. Keppra dose was increased to 2500mg  daily at one point, then dose was reduced to 1000mg  qhs. She was tried on Lamotrigine but could not tolerate 25mg  dose, "looked like she was institutionalized." On 08/01/18, Kaitlyn DandyMary could not wake her up in the morning, she would open her eyes but was unresponsive. Kaitlyn Powell sat her up and she started jerking for 10 minutes. They tried to stand her up after but they were dragging her like she was drunk and could not  move her legs. She started waking up and family gave her medications, went back to sleep, then again had difficulty waking her up followed by upper body jerking when she was sat up. She was found to have a pneumonia. Keppra dose increased to 1000mg  BID in addition to Dilantin 200mg  BID. Family reports some days she would have 3 in one day, other times she would go 48 hours without an event. Last episode was yesterday. She is also on Risperdal 1mg  BID, family is unsure when or why this was prescribed, she is always happy and always positive.   Update 02/13/2019: Vimpat has been quite sedating to her, she is only on 25mg  qhs, higher doses caused significant drowsiness. She had a 72-hour ambulatory video EEG study from June 30-February 03, 2019 which I personally reviewed, baseline EEG was abnormal with frequent generalized spikes, independent epileptiform discharges over the left and right temporal regions. There were 3 push button events, all in the bathroom, with 2 of them she is sitting on the commode then suddenly jerks back and appears unresponsive for a few seconds. There were no clear epileptiform discharges during these episodes but there  was low voltage fast activity/muscle, different from her baseline. No EKG changes. The third episode was lethargy/decreased responsiveness with no body jerk. Her sister has also noticed that she is having more cognitive issues, having a harder time finding her words. She has noticed that she would hold her hand in a pincer position more often. She is currently drowsy after her meal, which is typical for her.   Prior AEDs: Lamotrigine, Vimpat, Topamax   Diagnostic Data:  MRI brain with and without contrast done 05/2019 showed severe brain atrophy, especially in the temporal lobes, confluent FLAIR hyperintensity in the deep cerebral white matter, no acute changes.  Ambulatory EEG in 01/2019: This 65-hour ambulatory EEG study is abnormal due to the presence of: 1. Generalized  background slowing 2. Frequent generalized 2-2.5 Hz slow spike and wave discharges 3. Multifocal epileptiform discharges over the bifrontal/frontopolar, left > right temporal regions 4. There were 3 episodes captured, 2 with typical drop attacks with associated diffuse background suppression     Current Outpatient Medications on File Prior to Visit  Medication Sig Dispense Refill   albuterol (PROVENTIL) (2.5 MG/3ML) 0.083% nebulizer solution Take 3 mLs (2.5 mg total) by nebulization every 4 (four) hours as needed for wheezing or shortness of breath. 540 mL 0   cannabidiol (EPIDIOLEX) 100 MG/ML solution Give 3.7 mL twice a day 222 mL 5   carbidopa-levodopa (SINEMET IR) 25-100 MG tablet Place 1 tablet into feeding tube 3 (three) times daily. Take 1.5 tablet three times a day with meals 405 tablet 3   Elastic Bandages & Supports (GAIT/TRANSFER BELT) MISC 1 Product by Does not apply route daily. 1 each 3   folic acid (FOLVITE) 1 MG tablet Take 1 mg by mouth daily.     furosemide (LASIX) 20 MG tablet Place 1 tablet (20 mg total) into feeding tube daily as needed for fluid or edema (to maintain dry weight.). 30 tablet 11   ipratropium (ATROVENT) 0.02 % nebulizer solution Take 2.5 mLs (0.5 mg total) by nebulization every 4 (four) hours as needed for wheezing or shortness of breath. 450 mL 0   levETIRAcetam (KEPPRA) 100 MG/ML solution Give 66mL (500mg ) in AM, 59mL (1000mg ) in PM 473 mL 12   levothyroxine (SYNTHROID) 150 MCG tablet Place 1 tablet (150 mcg total) into feeding tube daily before breakfast. 30 tablet 0   NONFORMULARY OR COMPOUNDED ITEM Antifungal solution: Terbinafine 3%, Fluconazole 2%, Tea Tree Oil 5%, Urea 10%, Ibuprofen 2% in DMSO suspension #76mL 1 each 3   OXYGEN Inhale 3 L into the lungs as directed. With CPAP      phenytoin (DILANTIN) 125 MG/5ML suspension Take by mouth. 67ml daily     Sennosides (SENNA) 8.8 MG/5ML SYRP SMARTSIG:10 Milliliter(s) By Mouth Daily PRN      simvastatin (ZOCOR) 40 MG tablet TAKE 40 MG BY MOUTH DAILY AT BEDTIME 90 tablet 3   sodium chloride (MURO 128) 5 % ophthalmic ointment Place 1 application into both eyes at bedtime.     vitamin B-12 (CYANOCOBALAMIN) 100 MCG tablet TAKE 1 TABLET VIA G TUBE ONCE DAILY AS DIRECTED     XARELTO 20 MG TABS tablet TAKE 1 TABLET (20 MG TOTAL) BY MOUTH DAILY WITH SUPPER. 90 tablet 1   No current facility-administered medications on file prior to visit.     Observations/Objective:   Vitals:   02/15/20 1401  Weight: 159 lb (72.1 kg)  Height: 4\' 10"  (1.473 m)   GEN:  The patient appears stated age and is  in NAD.  Neurological examination: Patient is awake, alert, briefly attends to examiner and waves, no verbal output. Unable to follow commands. Cranial nerves: Extraocular movements intact with no nystagmus. No facial asymmetry. Motor: moves all extremities symmetrically, at least anti-gravity x 4.    Assessment and Plan:   This is a pleasant 64 yo LH woman with a history of Down syndrome, sleep apnea, hypothyroidism, sick sinus syndrome s/p PPM, and seizures. Her 72-hour EEG captured episodes with semiology that was not consistent with syncope, concerning for drop attacks with diffuse low voltage fast activity on EEG. She has had an excellent response to Epidiolex, she has not had any seizures since January 2021. Continue Epidiolex 3.33mL BID, Levetiracetam 500mg  in AM, 1000mg  in PM, and Dilantin 200mg  BID. We discussed her elevated Dilantin level done recently, I discussed that this was not a trough level and she does not appear symptomatic from it. Prior trough levels done on same dose were within range. Continue current AEDs. Her sister feels she may benefit from increasing dose of Sinemet to help with PT/mobility, increase Sinemet 25/100mg  to 2 tabs TID with meals. Continue PT. Follow-up in 6 months, family knows to call for any changes.    Follow Up Instructions:  -I discussed the assessment  and treatment plan with the patient. The patient was provided an opportunity to ask questions and all were answered. The patient agreed with the plan and demonstrated an understanding of the instructions.   The patient was advised to call back or seek an in-person evaluation if the symptoms worsen or if the condition fails to improve as anticipated.    , MD

## 2020-02-17 NOTE — Progress Notes (Signed)
Subjective:  Kaitlyn Powell presents to clinic today with cc of  painful, thick, discolored, elongated toenails 1-5 b/l that become tender and cannot cut because of thickness.  Pain is aggravated when wearing enclosed shoe gear.  Her sister and brother in law are present during the visit on today. Sister states Georgenia is sleepy on today's visit and she also has an occupational therapy appointment on today.  Current Outpatient Medications:  .  albuterol (PROVENTIL) (2.5 MG/3ML) 0.083% nebulizer solution, Take 3 mLs (2.5 mg total) by nebulization every 4 (four) hours as needed for wheezing or shortness of breath., Disp: 540 mL, Rfl: 0 .  cannabidiol (EPIDIOLEX) 100 MG/ML solution, Give 3.7 mL twice a day, Disp: 222 mL, Rfl: 5 .  carbidopa-levodopa (SINEMET IR) 25-100 MG tablet, Place 2 tablets into feeding tube 3 (three) times daily. Take 2 tablet three times a day with meals, Disp: 540 tablet, Rfl: 3 .  Elastic Bandages & Supports (GAIT/TRANSFER BELT) MISC, 1 Product by Does not apply route daily., Disp: 1 each, Rfl: 3 .  folic acid (FOLVITE) 1 MG tablet, Take 1 mg by mouth daily., Disp: , Rfl:  .  furosemide (LASIX) 20 MG tablet, Place 1 tablet (20 mg total) into feeding tube daily as needed for fluid or edema (to maintain dry weight.)., Disp: 30 tablet, Rfl: 11 .  ipratropium (ATROVENT) 0.02 % nebulizer solution, Take 2.5 mLs (0.5 mg total) by nebulization every 4 (four) hours as needed for wheezing or shortness of breath., Disp: 450 mL, Rfl: 0 .  levETIRAcetam (KEPPRA) 100 MG/ML solution, Give 60mL (500mg ) in AM, 48mL (1000mg ) in PM, Disp: 473 mL, Rfl: 11 .  levothyroxine (SYNTHROID) 150 MCG tablet, Place 1 tablet (150 mcg total) into feeding tube daily before breakfast., Disp: 30 tablet, Rfl: 0 .  NONFORMULARY OR COMPOUNDED ITEM, Antifungal solution: Terbinafine 3%, Fluconazole 2%, Tea Tree Oil 5%, Urea 10%, Ibuprofen 2% in DMSO suspension #16mL, Disp: 1 each, Rfl: 3 .  OXYGEN, Inhale 3 L into  the lungs as directed. With CPAP , Disp: , Rfl:  .  phenytoin (DILANTIN) 125 MG/5ML suspension, Give 74mL (200mg ) twice a day by PEG tube, Disp: 480 mL, Rfl: 11 .  Sennosides (SENNA) 8.8 MG/5ML SYRP, SMARTSIG:10 Milliliter(s) By Mouth Daily PRN, Disp: , Rfl:  .  simvastatin (ZOCOR) 40 MG tablet, TAKE 40 MG BY MOUTH DAILY AT BEDTIME, Disp: 90 tablet, Rfl: 3 .  sodium chloride (MURO 128) 5 % ophthalmic ointment, Place 1 application into both eyes at bedtime., Disp: , Rfl:  .  vitamin B-12 (CYANOCOBALAMIN) 100 MCG tablet, TAKE 1 TABLET VIA G TUBE ONCE DAILY AS DIRECTED, Disp: , Rfl:  .  XARELTO 20 MG TABS tablet, TAKE 1 TABLET (20 MG TOTAL) BY MOUTH DAILY WITH SUPPER., Disp: 90 tablet, Rfl: 1   Allergies  Allergen Reactions  . Cephalexin Other (See Comments)    Seizures   . Namenda [Memantine Hcl] Other (See Comments)    Made memory issues worse, sleepiness, increased confusion     Objective: There were no vitals filed for this visit.  Physical Examination:  Vascular Examination: Capillary refill time <3 seconds x 10 digits.  Palpable DP/PT pulses b/l.  Digital hair absent b/l.  Trace edema noted b/l.  Skin temperature gradient WNL b/l.  Dermatological Examination: Skin with normal turgor, texture and tone b/l.  No open wounds b/l.  No interdigital macerations noted b/l.  Elongated, thick, discolored brittle toenails with subungual debris and pain on dorsal palpation  of nailbeds 1-5 b/l.  Musculoskeletal Examination: Muscle strength 5/5 to all muscle groups b/l  No pain, crepitus or joint discomfort with active/passive ROM.  Neurological Examination: Sensation intact 5/5 b/l with 10 gram monofilament.  Vibratory sensation intact b/l.  Assessment: Mycotic nail infection with pain 1-5 b/l  Plan: 1. Toenails 1-5 b/l were debrided in length and girth without iatrogenic laceration. Discussed topical, laser and oral medication. Patient opted for topical treatment with  compounded medication. Rx written for nonformulary compounding topical antifungal: [facility]: Antifungal cream - Terbinafine 3%, Fluconazole 2%, Tea Tree Oil 5%, Urea 10%, Ibuprofen 2% in DMSO Suspension #30ml. Apply to the affected nail(s) at bedtime. 2.  Continue soft, supportive shoe gear daily. 3.  Report any pedal injuries to medical professional. 4.  Follow up 3 months. 5.  Patient/POA to call should there be a question/concern in there interim.

## 2020-02-19 NOTE — Progress Notes (Signed)
[facility] [address] [place] [address] Phone: [phone] Subjective:   I Ronelle Nigh am serving as a Neurosurgeon for Dr. Antoine Primas.  This visit occurred during the SARS-CoV-2 public health emergency.  Safety protocols were in place, including screening questions prior to the visit, additional usage of staff PPE, and extensive cleaning of exam room while observing appropriate contact time as indicated for disinfecting solutions.   I'm seeing this patient by the request  of:  Tracey Harries, MD  CC: Left ankle pain.  ION:[id number]  Kaitlyn Powell is a 64 y.o. female coming in with complaint of left ankle pain. Care givers states she has issues with walking. Not sure what exactly happened. May have twisted getting into a car. Ankle swollen. Has a rod in leg. Fractured left ankle and has rods in her foot (80s).   Onset- 5 days ago  Location Duration-  Character- Aggravating factors-   Reliving factors-  Therapies tried- ankle brace, ice  Severity-unable to know.  Level 5 caveat with patient being nonverbal.  Patient is accompanied with sister and brother past medical history significant for Down syndrome, DVT, and seizure disorder on phenytoin     Past Medical History:  Diagnosis Date  . Arthritis   . Aspiration pneumonia (HCC)   . Asthma   . Cardiac arrhythmia    have pacemeaker  . Down syndrome   . DVT of popliteal vein (HCC) 09/08/2018   chronic  . GERD (gastroesophageal reflux disease)   . Heart block   . Hypothyroidism   . Mental retardation   . Pacemaker   . Pulmonary emboli (HCC)   . Seizures (HCC)   . Sleep apnea with use of continuous positive airway pressure (CPAP)    Past Surgical History:  Procedure Laterality Date  . IR GASTROSTOMY TUBE MOD SED  10/04/2019  . IR RADIOLOGIST EVAL & MGMT  02/07/2020  . IR REPLC GASTRO/COLONIC TUBE PERCUT W/FLUORO  12/14/2019  . IR REPLC GASTRO/COLONIC TUBE PERCUT W/FLUORO  01/04/2020  .  PACEMAKER INSERTION  12/09/2015   [employer] Accolade MRI EL PPM implanted in PA for complete heart block and syncope  . PEG TUBE PLACEMENT     removed in 2017  . TIBIA FRACTURE SURGERY Right    Social History   Socioeconomic History  . Marital status: Single    Spouse name: Not on file  . Number of children: Not on file  . Years of education: Not on file  . Highest education level: Not on file  Occupational History  . Occupation: disabled  Tobacco Use  . Smoking status: Never Smoker  . Smokeless tobacco: Never Used  Vaping Use  . Vaping Use: Never used  Substance and Sexual Activity  . Alcohol use: No  . Drug use: No  . Sexual activity: Never  Other Topics Concern  . Not on file  Social History Narrative   Pt is left handed   Lives in 2 story home with her sister, brother-in-law and BIL's brother (pt stays on bottom floor)   Social Determinants of Health   Financial Resource Strain:   . Difficulty of Paying Living Expenses:   Food Insecurity:   . Worried About Programme researcher, broadcasting/film/video in the Last Year:   . Barista in the Last Year:   Transportation Needs:   . Freight forwarder (Medical):   Marland Kitchen Lack of Transportation (Non-Medical):   Physical Activity:   . Days  of Exercise per Week:   . Minutes of Exercise per Session:   Stress:   . Feeling of Stress :   Social Connections:   . Frequency of Communication with Friends and Family:   . Frequency of Social Gatherings with Friends and Family:   . Attends Religious Services:   . Active Member of Clubs or Organizations:   . Attends Banker Meetings:   Marland Kitchen Marital Status:    Allergies  Allergen Reactions  . Cephalexin Other (See Comments)    Seizures   . Namenda [Memantine Hcl] Other (See Comments)    Made memory issues worse, sleepiness, increased confusion   Family History  Problem Relation Age of Onset  . Diabetes Father   . Heart disease Father   . Breast cancer Sister      Current Outpatient Medications (Endocrine & Metabolic):  .  levothyroxine (SYNTHROID) 150 MCG tablet, Place 1 tablet (150 mcg total) into feeding tube daily before breakfast.  Current Outpatient Medications (Cardiovascular):  .  furosemide (LASIX) 20 MG tablet, Place 1 tablet (20 mg total) into feeding tube daily as needed for fluid or edema (to maintain dry weight.). Marland Kitchen  simvastatin (ZOCOR) 40 MG tablet, TAKE 40 MG BY MOUTH DAILY AT BEDTIME  Current Outpatient Medications (Respiratory):  .  albuterol (PROVENTIL) (2.5 MG/3ML) 0.083% nebulizer solution, Take 3 mLs (2.5 mg total) by nebulization every 4 (four) hours as needed for wheezing or shortness of breath. Marland Kitchen  ipratropium (ATROVENT) 0.02 % nebulizer solution, Take 2.5 mLs (0.5 mg total) by nebulization every 4 (four) hours as needed for wheezing or shortness of breath.   Current Outpatient Medications (Hematological):  .  folic acid (FOLVITE) 1 MG tablet, Take 1 mg by mouth daily. .  vitamin B-12 (CYANOCOBALAMIN) 100 MCG tablet, TAKE 1 TABLET VIA G TUBE ONCE DAILY AS DIRECTED .  XARELTO 20 MG TABS tablet, TAKE 1 TABLET (20 MG TOTAL) BY MOUTH DAILY WITH SUPPER.  Current Outpatient Medications (Other):  .  cannabidiol (EPIDIOLEX) 100 MG/ML solution, Give 3.7 mL twice a day .  carbidopa-levodopa (SINEMET IR) 25-100 MG tablet, Place 2 tablets into feeding tube 3 (three) times daily. Take 2 tablet three times a day with meals .  Elastic Bandages & Supports (GAIT/TRANSFER BELT) MISC, 1 Product by Does not apply route daily. Marland Kitchen  levETIRAcetam (KEPPRA) 100 MG/ML solution, Give 97mL (500mg ) in AM, 16mL (1000mg ) in PM .  NONFORMULARY OR COMPOUNDED ITEM, Antifungal solution: Terbinafine 3%, Fluconazole 2%, Tea Tree Oil 5%, Urea 10%, Ibuprofen 2% in DMSO suspension #41mL .  OXYGEN, Inhale 3 L into the lungs as directed. With CPAP  .  phenytoin (DILANTIN) 125 MG/5ML suspension, Give 98mL (200mg ) twice a day by PEG tube .  Sennosides (SENNA) 8.8  MG/5ML SYRP, SMARTSIG:10 Milliliter(s) By Mouth Daily PRN .  sodium chloride (MURO 128) 5 % ophthalmic ointment, Place 1 application into both eyes at bedtime.   Reviewed prior external information including notes and imaging from  primary care provider As well as notes that were available from care everywhere and other healthcare systems.  Past medical history, social, surgical and family history all reviewed in electronic medical record.  No pertanent information unless stated regarding to the chief complaint.   Review of Systems: Level 5 caveat  Objective  Blood pressure 110/70, pulse (!) 51, height 4\' 10"  (1.473 m), SpO2 93 %.   General: Patient appears to be somewhat somnolent.  Patient is in a wheelchair. No swelling of the  lower extremity noted today.  Appears to be neurovascularly intact.  Good capillary refill of the lower extremities. Left ankle shows some atrophy of the musculature laterally compared to the contralateral side.  Patient does have on the lateral aspect what appears to be superficial visualization of the previous screws and plate.  Appears uncomfortable but not painful.  Some mild limitation of the range of motion and patient does have some mild hypertonicity of the joints noted.    Impression and Recommendations:     The above documentation has been reviewed and is accurate and complete Judi Saa, DO       Note: This dictation was prepared with Dragon dictation along with smaller phrase technology. Any transcriptional errors that result from this process are unintentional.

## 2020-02-20 ENCOUNTER — Other Ambulatory Visit: Payer: Self-pay

## 2020-02-20 ENCOUNTER — Ambulatory Visit (INDEPENDENT_AMBULATORY_CARE_PROVIDER_SITE_OTHER): Payer: Medicare Other

## 2020-02-20 ENCOUNTER — Encounter: Payer: Self-pay | Admitting: Family Medicine

## 2020-02-20 ENCOUNTER — Ambulatory Visit (INDEPENDENT_AMBULATORY_CARE_PROVIDER_SITE_OTHER): Payer: Medicare Other | Admitting: Family Medicine

## 2020-02-20 VITALS — BP 110/70 | HR 51 | Ht <= 58 in

## 2020-02-20 DIAGNOSIS — G8929 Other chronic pain: Secondary | ICD-10-CM

## 2020-02-20 DIAGNOSIS — M25572 Pain in left ankle and joints of left foot: Secondary | ICD-10-CM

## 2020-02-20 NOTE — Patient Instructions (Addendum)
Vitamin D 2,000 IUs daily  Wear brace with activity Xrays look good  If worsening symptoms or skin breakdown see me again  See me again as needed

## 2020-02-20 NOTE — Assessment & Plan Note (Signed)
Difficult to assess.  Patient has had a history of a deep venous thrombosis, es, Down syndrome, seizure disorder, and potential Parkinson's.  He is always difficult.  Patient's x-rays do not show any significant loosening but patient still has a nonhealing tib-fib fracture that was multiple years old.  On MRI it appears to be proper alignment.  Discussed with patient about the potential for a Aircast just to help with transfers.  Patient is fairly nonmobile at this time.  Patient is accompanied with daughter and sister and discussed which activities to do which wants to avoid.  Vitamin D supplementation, follow-up with me as needed

## 2020-02-22 ENCOUNTER — Emergency Department (HOSPITAL_COMMUNITY)
Admission: EM | Admit: 2020-02-22 | Discharge: 2020-02-22 | Disposition: A | Discharge status: Home / Self Care | Payer: Medicare Other | Attending: Emergency Medicine | Admitting: Emergency Medicine

## 2020-02-22 ENCOUNTER — Other Ambulatory Visit: Payer: Self-pay | Admitting: Interventional Radiology

## 2020-02-22 ENCOUNTER — Encounter (HOSPITAL_COMMUNITY): Payer: Self-pay | Admitting: Emergency Medicine

## 2020-02-22 ENCOUNTER — Other Ambulatory Visit: Payer: Self-pay

## 2020-02-22 ENCOUNTER — Emergency Department (HOSPITAL_COMMUNITY): Payer: Medicare Other

## 2020-02-22 DIAGNOSIS — F0390 Unspecified dementia without behavioral disturbance: Secondary | ICD-10-CM | POA: Insufficient documentation

## 2020-02-22 DIAGNOSIS — J45909 Unspecified asthma, uncomplicated: Secondary | ICD-10-CM | POA: Insufficient documentation

## 2020-02-22 DIAGNOSIS — R9389 Abnormal findings on diagnostic imaging of other specified body structures: Secondary | ICD-10-CM | POA: Insufficient documentation

## 2020-02-22 DIAGNOSIS — G20 Parkinson's disease: Secondary | ICD-10-CM | POA: Diagnosis not present

## 2020-02-22 DIAGNOSIS — Z931 Gastrostomy status: Secondary | ICD-10-CM

## 2020-02-22 DIAGNOSIS — E039 Hypothyroidism, unspecified: Secondary | ICD-10-CM | POA: Diagnosis not present

## 2020-02-22 LAB — CBC WITH DIFFERENTIAL/PLATELET
Abs Immature Granulocytes: 0.02 10*3/uL (ref 0.00–0.07)
Basophils Absolute: 0.1 10*3/uL (ref 0.0–0.1)
Basophils Relative: 1 %
Eosinophils Absolute: 0.1 10*3/uL (ref 0.0–0.5)
Eosinophils Relative: 1 %
HCT: 33.2 % — ABNORMAL LOW (ref 36.0–46.0)
Hemoglobin: 11.1 g/dL — ABNORMAL LOW (ref 12.0–15.0)
Immature Granulocytes: 0 %
Lymphocytes Relative: 20 %
Lymphs Abs: 1.3 10*3/uL (ref 0.7–4.0)
MCH: 36.4 pg — ABNORMAL HIGH (ref 26.0–34.0)
MCHC: 33.4 g/dL (ref 30.0–36.0)
MCV: 108.9 fL — ABNORMAL HIGH (ref 80.0–100.0)
Monocytes Absolute: 0.8 10*3/uL (ref 0.1–1.0)
Monocytes Relative: 12 %
Neutro Abs: 4.1 10*3/uL (ref 1.7–7.7)
Neutrophils Relative %: 66 %
Platelets: 123 10*3/uL — ABNORMAL LOW (ref 150–400)
RBC: 3.05 MIL/uL — ABNORMAL LOW (ref 3.87–5.11)
RDW: 12.3 % (ref 11.5–15.5)
WBC: 6.3 10*3/uL (ref 4.0–10.5)
nRBC: 0 % (ref 0.0–0.2)

## 2020-02-22 LAB — COMPREHENSIVE METABOLIC PANEL
ALT: 7 U/L (ref 0–44)
AST: 17 U/L (ref 15–41)
Albumin: 3.5 g/dL (ref 3.5–5.0)
Alkaline Phosphatase: 93 U/L (ref 38–126)
Anion gap: 12 (ref 5–15)
BUN: 37 mg/dL — ABNORMAL HIGH (ref 8–23)
CO2: 28 mmol/L (ref 22–32)
Calcium: 9.2 mg/dL (ref 8.9–10.3)
Chloride: 94 mmol/L — ABNORMAL LOW (ref 98–111)
Creatinine, Ser: 0.85 mg/dL (ref 0.44–1.00)
GFR calc Af Amer: >60 mL/min (ref >60)
GFR calc non Af Amer: >60 mL/min (ref >60)
Glucose, Bld: 96 mg/dL (ref 70–99)
Potassium: 4.9 mmol/L (ref 3.5–5.1)
Sodium: 134 mmol/L — ABNORMAL LOW (ref 135–145)
Total Bilirubin: 0.5 mg/dL (ref 0.3–1.2)
Total Protein: 8.7 g/dL — ABNORMAL HIGH (ref 6.5–8.1)

## 2020-02-22 LAB — LIPASE, BLOOD: Lipase: 109 U/L — ABNORMAL HIGH (ref 11–51)

## 2020-02-22 MED ORDER — PHENYTOIN 125 MG/5ML PO SUSP
200.0000 mg | Freq: Once | ORAL | Status: AC
  Administered 2020-02-22: 200 mg via ORAL
  Filled 2020-02-22: qty 8

## 2020-02-22 MED ORDER — IOHEXOL 9 MG/ML PO SOLN
500.0000 mL | ORAL | Status: AC
  Administered 2020-02-22: 500 mL via ORAL

## 2020-02-22 MED ORDER — SODIUM CHLORIDE 0.9 % IV SOLN: Freq: Once | INTRAVENOUS | Status: DC

## 2020-02-22 MED ORDER — LEVETIRACETAM 100 MG/ML PO SOLN
1000.0000 mg | Freq: Once | ORAL | Status: AC
  Administered 2020-02-22: 1000 mg
  Filled 2020-02-22: qty 10

## 2020-02-22 MED ORDER — IOHEXOL 300 MG/ML  SOLN
100.0000 mL | Freq: Once | INTRAMUSCULAR | Status: AC | PRN
  Administered 2020-02-22: 100 mL via INTRAVENOUS

## 2020-02-22 NOTE — ED Triage Notes (Addendum)
Patient BIB sister, reports patient had chest XR yesterday and called with results of pocket of air near diaphragm. Patient has PEG tube. Hx down syndrome, dementia, and parkinson's.

## 2020-02-22 NOTE — ED Notes (Signed)
Secretary called OR for g-tube adaptor to be sent to ED. Awaiting arrival.

## 2020-02-22 NOTE — ED Provider Notes (Signed)
[facility]-EMERGENCY DEPT Provider Note   CSN: [id number] Arrival date & time: 02/22/20  1741     History Chief Complaint  Patient presents with  . Abnormal Lab    Aleksandra Raben is a 64 y.o. female.  The history is provided by a relative and medical records. No language interpreter was used.  Abnormal Lab  Mercie Balsley is a 64 y.o. female who presents to the Emergency Department complaining of abnormal x-ray. She presents the emergency department accompanied by her sister for evaluation of abnormal x-ray that was performed earlier today. She saw her PCP for routine visit but is noted that she is at increased lethargy for the last week and has built a few times, which is unusual for her. Family did notice a small amount of blood around her gastrostomy tube site. No fevers, vomiting, blocker blood he stools. She has normal urine output. She has dementia and Parkinson's and is nonverbal at baseline. Normally she is able to walk but has not been walking for the last week. No witnessed aspiration events. At the PCPs office of chest x-ray was obtained in the chest x-ray demonstrated free air under the right hemi diaphragm and she was referred to the emergency department for further evaluation.    Past Medical History:  Diagnosis Date  . Arthritis   . Aspiration pneumonia (HCC)   . Asthma   . Cardiac arrhythmia    have pacemeaker  . Down syndrome   . DVT of popliteal vein (HCC) 09/08/2018   chronic  . GERD (gastroesophageal reflux disease)   . Heart block   . Hypothyroidism   . Mental retardation   . Pacemaker   . Pulmonary emboli (HCC)   . Seizures (HCC)   . Sleep apnea with use of continuous positive airway pressure (CPAP)     Patient Active Problem List   Diagnosis Date Noted  . Left ankle pain 02/20/2020  . Gastrostomy status (HCC)   . Moderate malnutrition (HCC)   . Acute respiratory failure with hypoxia (HCC)   . Dysphagia   . Advanced care  planning/counseling discussion   . Goals of care, counseling/discussion   . Palliative care by specialist   . Cellulitis of left hand 04/04/2019  . Left hand pain 04/04/2019  . Heart block AV complete (HCC) 01/19/2019  . Shortness of breath 01/19/2019  . Chronic deep vein thrombosis (DVT) of right popliteal vein (HCC) 09/28/2018  . 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 Aspiration pneumonia (HCC) 08/01/2018  . Dehydration 08/01/2018  . Toxic metabolic encephalopathy 08/01/2018  . Functional urinary incontinence 07/22/2017  . OSA treated with BiPAP 12/31/2016  . Mixed conductive and sensorineural hearing loss of both ears 08/05/2016  . Seizures (HCC) 07/03/2016  . Bilateral hearing loss 07/03/2016  . Down syndrome 05/10/2016  . Anemia due to medication 05/10/2016  . Acquired hypothyroidism 05/10/2016  . Elevated lipase 05/10/2016  . History of pulmonary embolism 05/10/2016  . G tube feedings (HCC) 05/10/2016  . Pacemaker 05/10/2016  . S/P percutaneous endoscopic gastrostomy (PEG) tube placement (HCC) 05/10/2016    Past Surgical History:  Procedure Laterality Date  . IR GASTROSTOMY TUBE MOD SED  10/04/2019  . IR RADIOLOGIST EVAL & MGMT  02/07/2020  . IR REPLC GASTRO/COLONIC TUBE PERCUT W/FLUORO  12/14/2019  . IR REPLC GASTRO/COLONIC TUBE PERCUT W/FLUORO  01/04/2020  . PACEMAKER INSERTION  12/09/2015   [employer] planner  MRI EL PPM implanted in PA for complete heart block and syncope  . PEG TUBE PLACEMENT     removed in 2017  . TIBIA FRACTURE SURGERY Right      OB History   No obstetric history on file.     Family History  Problem Relation Age of Onset  . Diabetes Father   . Heart disease Father   . Breast cancer Sister     Social History   Tobacco Use  . Smoking status: Never Smoker  . Smokeless tobacco: Never Used  Vaping Use  .  Vaping Use: Never used  Substance Use Topics  . Alcohol use: No  . Drug use: No    Home Medications Prior to Admission medications   Medication Sig Start Date End Date Taking? Authorizing Provider  albuterol (PROVENTIL) (2.5 MG/3ML) 0.083% nebulizer solution Take 2.5 mg by nebulization every 6 (six) hours as needed for wheezing or shortness of breath.   Yes [provider]  cannabidiol (EPIDIOLEX) 100 MG/ML solution Give 3.7 mL twice a day 02/15/20  Yes Van Clines, MD  carbidopa-levodopa (SINEMET IR) 25-100 MG tablet Place 2 tablets into feeding tube 3 (three) times daily. Take 2 tablet three times a day with meals Patient taking differently: Place 2 tablets into feeding tube 3 (three) times daily. Take 2 tablet three times a day with meals at (8am, 12 pm, 4pm) 02/15/20  Yes Van Clines, MD  folic acid (FOLVITE) 1 MG tablet Take 1 mg by mouth daily. 02/03/20  Yes [provider]  furosemide (LASIX) 20 MG tablet Place 1 tablet (20 mg total) into feeding tube daily as needed for fluid or edema (to maintain dry weight.). 10/12/19 10/11/20 Yes Dellia Cloud, MD  levETIRAcetam (KEPPRA) 100 MG/ML solution Give 54mL (500mg ) in AM, 40mL (1000mg ) in PM 02/15/20  Yes , MD  levothyroxine (SYNTHROID) 150 MCG tablet Place 150 mcg into feeding tube daily before breakfast.   Yes [provider]  NONFORMULARY OR COMPOUNDED ITEM Antifungal solution: Terbinafine 3%, Fluconazole 2%, Tea Tree Oil 5%, Urea 10%, Ibuprofen 2% in DMSO suspension #25mL 02/13/20  Yes [place], 31m, DPM  phenytoin (DILANTIN) 125 MG/5ML suspension Give 68mL (200mg ) twice a day by PEG tube 02/15/20  Yes 4m, MD  Sennosides (SENNA) 8.8 MG/5ML SYRP Take 10 mLs by mouth daily as needed (constipation).  01/14/20  Yes [provider]  simvastatin (ZOCOR) 40 MG tablet TAKE 40 MG BY MOUTH DAILY AT BEDTIME Patient taking differently: Take 40 mg by mouth at bedtime.  12/25/19  Yes  Allred, Van Clines, MD  sodium chloride (MURO 128) 5 % ophthalmic ointment Place 1 application into both eyes at bedtime.   Yes [provider]  vitamin B-12 (CYANOCOBALAMIN) 100 MCG tablet Place 100 mcg into feeding tube daily.  12/26/19  Yes [provider]  XARELTO 20 MG TABS tablet TAKE 1 TABLET (20 MG TOTAL) BY MOUTH DAILY WITH SUPPER. Patient taking differently: Take 20 mg by mouth daily.  01/12/20  Yes Allred, Fayrene Fearing, MD  albuterol (PROVENTIL) (2.5 MG/3ML) 0.083% nebulizer solution Take 3 mLs (2.5 mg total) by nebulization every 4 (four) hours as needed for wheezing or shortness of breath. 10/12/19 02/15/20  Fayrene Fearing, MD  Elastic Bandages & Supports (GAIT/TRANSFER BELT) MISC 1 Product by Does not apply route daily. 04/03/19   02/17/20, MD  ipratropium (ATROVENT) 0.02 % nebulizer solution Take 2.5 mLs (0.5 mg total) by nebulization every 4 (four)  hours as needed for wheezing or shortness of breath. 10/12/19 02/15/20  Dellia Cloud, MD  levothyroxine (SYNTHROID) 150 MCG tablet Place 1 tablet (150 mcg total) into feeding tube daily before breakfast. 10/12/19 02/15/20  Dellia Cloud, MD  OXYGEN Inhale 3 L into the lungs as directed. With CPAP     [provider]    Allergies    Cephalexin and Namenda [memantine hcl]  Review of Systems   Review of Systems  All other systems reviewed and are negative.   Physical Exam Updated Vital Signs BP 103/72   Pulse 71   Temp 97.7 F (36.5 C) (Axillary)   Resp (!) 35   SpO2 98%   Physical Exam Vitals and nursing note reviewed.  Constitutional:      Appearance: She is well-developed.  HENT:     Head: Normocephalic and atraumatic.  Cardiovascular:     Rate and Rhythm: Normal rate and regular rhythm.     Heart sounds: No murmur heard.   Pulmonary:     Effort: Pulmonary effort is normal. No respiratory distress.     Breath sounds: Normal breath sounds.  Abdominal:     Palpations: Abdomen is soft.     Tenderness:  There is no abdominal tenderness. There is no guarding or rebound.     Comments: Gastrostomy tube in the upper abdomen without surrounding erythema or induration.  Musculoskeletal:        General: No tenderness.  Skin:    General: Skin is warm and dry.  Neurological:     Mental Status: She is alert.     Comments: Nonverbal. Looks about the room. Profound generalized weakness  Psychiatric:     Comments: Unable to assess.      ED Results / Procedures / Treatments   Labs (all labs ordered are listed, but only abnormal results are displayed) Labs Reviewed  COMPREHENSIVE METABOLIC PANEL - Abnormal; Notable for the following components:      Result Value   Sodium 134 (*)    Chloride 94 (*)    BUN 37 (*)    Total Protein 8.7 (*)    All other components within normal limits  CBC WITH DIFFERENTIAL/PLATELET - Abnormal; Notable for the following components:   RBC 3.05 (*)    Hemoglobin 11.1 (*)    HCT 33.2 (*)    MCV 108.9 (*)    MCH 36.4 (*)    Platelets 123 (*)    All other components within normal limits  LIPASE, BLOOD - Abnormal; Notable for the following components:   Lipase 109 (*)    All other components within normal limits    EKG None  Radiology CT Abdomen Pelvis W Contrast  Result Date: 02/22/2020 CLINICAL DATA:  Abdominal pain, dementia, Parkinson's EXAM: CT ABDOMEN AND PELVIS WITH CONTRAST TECHNIQUE: Multidetector CT imaging of the abdomen and pelvis was performed using the standard protocol following bolus administration of intravenous contrast. CONTRAST:  OMNIPAQUE IOHEXOL 300 MG/ML  SOLN COMPARISON:  10/27/2019 FINDINGS: Lower chest: No acute pleural or parenchymal lung disease. Stable cardiac pacer. Hepatobiliary: No focal liver abnormality is seen. No gallstones, gallbladder wall thickening, or biliary dilatation. Pancreas: Unremarkable. No pancreatic ductal dilatation or surrounding inflammatory changes. Spleen: Normal in size without focal abnormality.  Adrenals/Urinary Tract: Kidneys enhance normally and symmetrically. No urinary tract calculi or obstruction. Chronic mild bladder wall thickening and trabeculations consistent with bladder outlet obstruction. Adrenals are normal. Stomach/Bowel: Percutaneous gastrostomy tube is seen within the gastric lumen. No bowel  obstruction or ileus. Colonic interposition noted in the right upper quadrant. No bowel wall thickening or inflammatory change. Vascular/Lymphatic: No significant vascular findings are present. No enlarged abdominal or pelvic lymph nodes. Reproductive: Uterus and bilateral adnexa are unremarkable. Other: No free fluid or free gas.  No abdominal wall hernia. Musculoskeletal: Chronic compression deformities of T12, L1, and L2. No acute bony abnormalities. Reconstructed images demonstrate no additional findings. IMPRESSION: 1. No acute intra-abdominal or intrapelvic process. 2. Chronic bladder wall thickening and trabeculations consistent with bladder outlet obstruction. Electronically Signed   By: Sharlet SalinaMichael  Brown M.D.   On: 02/22/2020 21:59    Procedures Procedures (including critical care time)  Medications Ordered in ED Medications  iohexol (OMNIPAQUE) 9 MG/ML oral solution 500 mL (500 mLs Oral Contrast Given 02/22/20 2131)  0.9 %  sodium chloride infusion (has no administration in time range)  levETIRAcetam (KEPPRA) 100 MG/ML solution 1,000 mg (1,000 mg Per Tube Given 02/22/20 2124)  phenytoin (DILANTIN) 125 MG/5ML suspension 200 mg (200 mg Oral Given 02/22/20 2123)  iohexol (OMNIPAQUE) 300 MG/ML solution 100 mL (100 mLs Intravenous Contrast Given 02/22/20 2139)    ED Course  I have reviewed the triage vital signs and the nursing notes.  Pertinent labs & imaging results that were available during my care of the patient were reviewed by me and considered in my medical decision making (see chart for details).    MDM Rules/Calculators/A&P                         Patient presented to the  emergency department for evaluation following finding of abnormal x-ray that was concerning for free air. CT scan obtained to the emergency department with no evidence of free air. Patient is non-toxic appearing on evaluation with no significant abdominal tenderness. These note that patient did not have heart rate to the 140s. Discussed with sister outpatient follow-up and return precautions. No current evidence of infectious process or acute abdomen.  Final Clinical Impression(s) / ED Diagnoses Final diagnoses:  Abnormal radiograph    Rx / DC Orders ED Discharge Orders    None       Tilden Fossaees, Helga Asbury, MD 02/22/20 2230

## 2020-02-22 NOTE — Discharge Instructions (Addendum)
Continued Vaudine's medications as directed. Please follow-up with Dr. Teryl Lucy for recheck. Get rechecked immediately if Amely has any new or concerning symptoms.

## 2020-02-25 ENCOUNTER — Other Ambulatory Visit: Payer: Self-pay | Admitting: Gastroenterology

## 2020-02-28 ENCOUNTER — Ambulatory Visit
Admission: RE | Admit: 2020-02-28 | Discharge: 2020-02-28 | Disposition: A | Discharge status: Home / Self Care | Payer: Medicare Other | Source: Ambulatory Visit | Attending: Interventional Radiology | Admitting: Interventional Radiology

## 2020-02-28 ENCOUNTER — Encounter: Payer: Self-pay | Admitting: *Deleted

## 2020-02-28 DIAGNOSIS — Z931 Gastrostomy status: Secondary | ICD-10-CM

## 2020-02-28 HISTORY — PX: IR RADIOLOGIST EVAL & MGMT: IMG5224

## 2020-02-28 NOTE — Progress Notes (Signed)
Chief Complaint: Patient was seen in consultation today for g-tube issue at the request of Remonia Otte  Referring Physician(s): Georgi Tuel  History of Present Illness: Kaitlyn Powell is a 64 y.o. female with an indwelling percutaneous gastrostomy tube which was placed for dysphagia and aspiration.  The tube is a 20 [place] low-profile Mickey gastrostomy tube with a 3 cm stoma length.  The tube was placed on 01/04/2020.  Kaitlyn Powell presents today with her family with a complaint of redness and pain around the gastrostomy tube.  The tube was examined and it was determined that while the stoma length is appropriate when she is in the supine position, when she sits upright there is bunching of the adipose tissue on her anterior abdominal wall which then extends outward and creates pressure between the external portion of the gastrostomy tube and the skin surface resulting in skin irritation.  Other than this pain, she has no significant clinical symptoms.  Her father notes that when she wakes up in the morning after being supine all night, the skin changes are almost completely gone but it becomes quite red and painful by the end of the day when she is upright.  Past Medical History:  Diagnosis Date  . Arthritis   . Aspiration pneumonia (HCC)   . Asthma   . Cardiac arrhythmia    have pacemeaker  . Down syndrome   . DVT of popliteal vein (HCC) 09/08/2018   chronic  . GERD (gastroesophageal reflux disease)   . Heart block   . Hypothyroidism   . Mental retardation   . Pacemaker   . Pulmonary emboli (HCC)   . Seizures (HCC)   . Sleep apnea with use of continuous positive airway pressure (CPAP)     Past Surgical History:  Procedure Laterality Date  . IR GASTROSTOMY TUBE MOD SED  10/04/2019  . IR RADIOLOGIST EVAL & MGMT  02/07/2020  . IR RADIOLOGIST EVAL & MGMT  02/28/2020  . IR REPLC GASTRO/COLONIC TUBE PERCUT W/FLUORO  12/14/2019  . IR REPLC GASTRO/COLONIC TUBE PERCUT W/FLUORO   01/04/2020  . PACEMAKER INSERTION  12/09/2015   [employer] Accolade MRI EL PPM implanted in [place] for complete heart block and syncope  . PEG TUBE PLACEMENT     removed in 2017  . TIBIA FRACTURE SURGERY Right     Allergies: Cephalexin and Namenda [memantine hcl]  Medications: Prior to Admission medications   Medication Sig Start Date End Date Taking? Authorizing Provider  albuterol (PROVENTIL) (2.5 MG/3ML) 0.083% nebulizer solution Take 3 mLs (2.5 mg total) by nebulization every 4 (four) hours as needed for wheezing or shortness of breath. 10/12/19 02/15/20  Dellia Cloudoe, Benjamin, MD  albuterol (PROVENTIL) (2.5 MG/3ML) 0.083% nebulizer solution Take 2.5 mg by nebulization every 6 (six) hours as needed for wheezing or shortness of breath.    [provider]  cannabidiol (EPIDIOLEX) 100 MG/ML solution Give 3.7 mL twice a day 02/15/20   Van ClinesAquino, Karen M, MD  carbidopa-levodopa (SINEMET IR) 25-100 MG tablet Place 2 tablets into feeding tube 3 (three) times daily. Take 2 tablet three times a day with meals Patient taking differently: Place 2 tablets into feeding tube 3 (three) times daily. Take 2 tablet three times a day with meals at (8am, 12 pm, 4pm) 02/15/20   Van ClinesAquino, Karen M, MD  Elastic Bandages & Supports (GAIT/TRANSFER BELT) MISC 1 Product by Does not apply route daily. 04/03/19   Van ClinesAquino, Karen M, MD  folic acid (FOLVITE) 1 MG tablet Take 1  mg by mouth daily. 02/03/20   [provider]  furosemide (LASIX) 20 MG tablet Place 1 tablet (20 mg total) into feeding tube daily as needed for fluid or edema (to maintain dry weight.). 10/12/19 10/11/20  Dellia Cloud, MD  ipratropium (ATROVENT) 0.02 % nebulizer solution Take 2.5 mLs (0.5 mg total) by nebulization every 4 (four) hours as needed for wheezing or shortness of breath. 10/12/19 02/15/20  Dellia Cloud, MD  levETIRAcetam (KEPPRA) 100 MG/ML solution Give 68mL (500mg ) in AM, 33mL (1000mg ) in PM 02/15/20   , MD  levothyroxine  (SYNTHROID) 150 MCG tablet Place 1 tablet (150 mcg total) into feeding tube daily before breakfast. 10/12/19 02/15/20  12/12/19, MD  levothyroxine (SYNTHROID) 150 MCG tablet Place 150 mcg into feeding tube daily before breakfast.    [provider]  NONFORMULARY OR COMPOUNDED ITEM Antifungal solution: Terbinafine 3%, Fluconazole 2%, Tea Tree Oil 5%, Urea 10%, Ibuprofen 2% in DMSO suspension #75mL 02/13/20   [place], 31m, DPM  OXYGEN Inhale 3 L into the lungs as directed. With CPAP     [provider]  phenytoin (DILANTIN) 125 MG/5ML suspension Give 39mL (200mg ) twice a day by PEG tube 02/15/20   4m, MD  Sennosides (SENNA) 8.8 MG/5ML SYRP TAKE 10 MLS BY MOUTH DAILY AS NEEDED 02/26/20   02/17/20, MD  simvastatin (ZOCOR) 40 MG tablet TAKE 40 MG BY MOUTH DAILY AT BEDTIME Patient taking differently: Take 40 mg by mouth at bedtime.  12/25/19   Allred, 02/28/20, MD  sodium chloride (MURO 128) 5 % ophthalmic ointment Place 1 application into both eyes at bedtime.    [provider]  vitamin B-12 (CYANOCOBALAMIN) 100 MCG tablet Place 100 mcg into feeding tube daily.  12/26/19   [provider]  XARELTO 20 MG TABS tablet TAKE 1 TABLET (20 MG TOTAL) BY MOUTH DAILY WITH SUPPER. Patient taking differently: Take 20 mg by mouth daily.  01/12/20   Fayrene Fearing, MD     Family History  Problem Relation Age of Onset  . Diabetes Father   . Heart disease Father   . Breast cancer Sister     Social History   Socioeconomic History  . Marital status: Single    Spouse name: Not on file  . Number of children: Not on file  . Years of education: Not on file  . Highest education level: Not on file  Occupational History  . Occupation: disabled  Tobacco Use  . Smoking status: Never Smoker  . Smokeless tobacco: Never Used  Vaping Use  . Vaping Use: Never used  Substance and Sexual Activity  . Alcohol use: No  . Drug use: No  . Sexual activity: Never    Other Topics Concern  . Not on file  Social History Narrative   Pt is left handed   Lives in 2 story home with her sister, brother-in-law and BIL's brother (pt stays on bottom floor)   Social Determinants of Health   Financial Resource Strain:   . Difficulty of Paying Living Expenses:   Food Insecurity:   . Worried About 12/28/19 in the Last Year:   . 03/13/20 in the Last Year:   Transportation Needs:   . [place] (Medical):   Programme researcher, broadcasting/film/video Lack of Transportation (Non-Medical):   Physical Activity:   . Days of Exercise per Week:   . Minutes of Exercise per Session:   Stress:   .  Feeling of Stress :   Social Connections:   . Frequency of Communication with Friends and Family:   . Frequency of Social Gatherings with Friends and Family:   . Attends Religious Services:   . Active Member of Clubs or Organizations:   . Attends Banker Meetings:   Marland Kitchen Marital Status:     Review of Systems: A 12 point ROS discussed and pertinent positives are indicated in the HPI above.  All other systems are negative.  Review of Systems  Vital Signs: There were no vitals taken for this visit.  Physical Exam   Scaly erythema surrounding the gastrostomy tube in the precise shape of the external component of the tube.  No induration, fluctuance or drainage.  Imaging: DG Ankle 2 Views Left  Result Date: 02/21/2020 CLINICAL DATA:  Chronic left ankle pain. EXAM: LEFT ANKLE - 2 VIEW COMPARISON:  None. FINDINGS: Status post surgical internal fixation of old distal left tibial fracture and distal left fibular fracture. No acute fracture or dislocation is noted. Soft tissues are unremarkable. IMPRESSION: No acute abnormality seen in the left ankle. Electronically Signed   By: Lupita Raider M.D.   On: 02/21/2020 16:29   CT Abdomen Pelvis W Contrast  Result Date: 02/22/2020 CLINICAL DATA:  Abdominal pain, dementia, Parkinson's EXAM: CT ABDOMEN AND PELVIS WITH CONTRAST  TECHNIQUE: Multidetector CT imaging of the abdomen and pelvis was performed using the standard protocol following bolus administration of intravenous contrast. CONTRAST:  OMNIPAQUE IOHEXOL 300 MG/ML  SOLN COMPARISON:  10/27/2019 FINDINGS: Lower chest: No acute pleural or parenchymal lung disease. Stable cardiac pacer. Hepatobiliary: No focal liver abnormality is seen. No gallstones, gallbladder wall thickening, or biliary dilatation. Pancreas: Unremarkable. No pancreatic ductal dilatation or surrounding inflammatory changes. Spleen: Normal in size without focal abnormality. Adrenals/Urinary Tract: Kidneys enhance normally and symmetrically. No urinary tract calculi or obstruction. Chronic mild bladder wall thickening and trabeculations consistent with bladder outlet obstruction. Adrenals are normal. Stomach/Bowel: Percutaneous gastrostomy tube is seen within the gastric lumen. No bowel obstruction or ileus. Colonic interposition noted in the right upper quadrant. No bowel wall thickening or inflammatory change. Vascular/Lymphatic: No significant vascular findings are present. No enlarged abdominal or pelvic lymph nodes. Reproductive: Uterus and bilateral adnexa are unremarkable. Other: No free fluid or free gas.  No abdominal wall hernia. Musculoskeletal: Chronic compression deformities of T12, L1, and L2. No acute bony abnormalities. Reconstructed images demonstrate no additional findings. IMPRESSION: 1. No acute intra-abdominal or intrapelvic process. 2. Chronic bladder wall thickening and trabeculations consistent with bladder outlet obstruction. Electronically Signed   By: Sharlet Salina M.D.   On: 02/22/2020 21:59   CUP PACEART REMOTE DEVICE CHECK  Result Date: 02/08/2020 Scheduled remote reviewed. Normal device function.  20 atrial arrhythmias that all lasted less than one minute Next remote 91 days. Hassell Halim, RN, CCDS, [employer]  IR Radiologist Eval & Mgmt  Result Date:  02/28/2020 Please refer to notes tab for details about interventional procedure. (Op Note)  IR Radiologist Eval & Mgmt  Result Date: 02/07/2020 Please refer to notes tab for details about interventional procedure. (Op Note)   Labs:  CBC: Recent Labs    10/09/19 0354 10/27/19 1618 02/07/20 1405 02/22/20 2016  WBC 3.8* 8.5 4.9 6.3  HGB 8.6* 10.7* 11.8* 11.1*  HCT 27.0* 31.7* 34.2* 33.2*  PLT 125* 229 128.0* 123*    COAGS: No results for input(s): INR, APTT in the last 8760 hours.  BMP: Recent Labs  10/07/19 1633 10/07/19 1633 10/09/19 0354 10/09/19 0354 10/10/19 0430 10/27/19 1618 02/07/20 1405 02/22/20 2016  NA 138   < > 136   < > 135 131* 131* 134*  K 4.4   < > 4.3   < > 4.4 4.8 4.1 4.9  CL 97*   < > 96*   < > 95* 93* 97 94*  CO2 32   < > 31   < > 32 27 28 28   GLUCOSE 90   < > 104*   < > 91 91 86 96  BUN 7*   < > 9   < > 12 40* 19 37*  CALCIUM 8.4*   < > 8.5*   < > 8.8* 9.7 8.6 9.2  CREATININE 0.71   < > 0.53   < > 0.76 0.91 0.84 0.85  GFRNONAA >60  --  >60  --  >60  --   --  >60  GFRAA >60  --  >60  --  >60  --   --  >60   < > = values in this interval not displayed.    LIVER FUNCTION TESTS: Recent Labs    10/04/19 12/04/19 02/07/20 1332 02/07/20 1405 02/22/20 2016  BILITOT 0.3 0.4 0.4 0.5  AST 18 18 16 17   ALT 5 4 4 7   ALKPHOS 54 121* 118* 93  PROT 5.9* 8.8* 8.1 8.7*  ALBUMIN 1.6* 3.8 3.7 3.5    TUMOR MARKERS: No results for input(s): AFPTM, CEA, CA199, CHROMGRNA in the last 8760 hours.  Assessment and Plan:  Pressure related skin breakdown secondary to a discrepancy in the required stoma length between the supine and upright positions.  Ideally, I think we can resolve this problem by exchanging her existing gastrostomy tube for 1 with a longer stoma length to give her more room when she is in the seated position during the daytime hours.  1.)  Please have 02/24/20 long IR order in a 4 cm stoma length 20 French low-profile Mickey percutaneous  gastrostomy tube and then schedule the patient to come in for tube exchange.   Thank you for this interesting consult.  I greatly enjoyed meeting Kaitlyn Powell and look forward to participating in their care.  A copy of this report was sent to the requesting provider on this date.  Electronically Signed: Gerri Spore 02/28/2020, 4:58 PM   I spent a total of  10 Minutes in face to face in clinical consultation, greater than 50% of which was counseling/coordinating care for gastrostomy tube complications.

## 2020-03-01 ENCOUNTER — Ambulatory Visit: Payer: Medicare Other | Admitting: Neurology

## 2020-03-12 ENCOUNTER — Other Ambulatory Visit (HOSPITAL_COMMUNITY): Payer: Self-pay | Admitting: Interventional Radiology

## 2020-03-12 DIAGNOSIS — R633 Feeding difficulties, unspecified: Secondary | ICD-10-CM

## 2020-03-19 ENCOUNTER — Ambulatory Visit (HOSPITAL_COMMUNITY)
Admission: RE | Admit: 2020-03-19 | Discharge: 2020-03-19 | Disposition: A | Discharge status: Home / Self Care | Payer: Medicare Other | Source: Ambulatory Visit | Attending: Interventional Radiology | Admitting: Interventional Radiology

## 2020-03-19 ENCOUNTER — Other Ambulatory Visit: Payer: Self-pay

## 2020-03-19 ENCOUNTER — Other Ambulatory Visit (HOSPITAL_COMMUNITY): Payer: Self-pay | Admitting: Interventional Radiology

## 2020-03-19 DIAGNOSIS — R55 Syncope and collapse: Secondary | ICD-10-CM | POA: Insufficient documentation

## 2020-03-19 DIAGNOSIS — R633 Feeding difficulties, unspecified: Secondary | ICD-10-CM

## 2020-03-19 DIAGNOSIS — Z431 Encounter for attention to gastrostomy: Secondary | ICD-10-CM | POA: Insufficient documentation

## 2020-03-19 HISTORY — PX: IR REPLC GASTRO/COLONIC TUBE PERCUT W/FLUORO: IMG2333

## 2020-03-27 ENCOUNTER — Ambulatory Visit (INDEPENDENT_AMBULATORY_CARE_PROVIDER_SITE_OTHER): Payer: Medicare Other | Admitting: Physician Assistant

## 2020-03-27 ENCOUNTER — Encounter: Payer: Self-pay | Admitting: Physician Assistant

## 2020-03-27 VITALS — BP 100/60 | HR 50 | Ht <= 58 in | Wt 142.1 lb

## 2020-03-27 DIAGNOSIS — G20 Parkinson's disease: Secondary | ICD-10-CM

## 2020-03-27 DIAGNOSIS — K5900 Constipation, unspecified: Secondary | ICD-10-CM | POA: Diagnosis not present

## 2020-03-27 DIAGNOSIS — G20A1 Parkinson's disease without dyskinesia, without mention of fluctuations: Secondary | ICD-10-CM

## 2020-03-27 DIAGNOSIS — Q909 Down syndrome, unspecified: Secondary | ICD-10-CM | POA: Diagnosis not present

## 2020-03-27 DIAGNOSIS — Z931 Gastrostomy status: Secondary | ICD-10-CM

## 2020-03-27 MED ORDER — OSMOLITE 1.2 CAL PO LIQD: 237.0000 mL | Freq: Three times a day (TID) | ORAL | 11 refills | Status: DC

## 2020-03-27 MED ORDER — SYRINGE DISPOSABLE 60 ML MISC: 12 refills | Status: DC

## 2020-03-27 NOTE — Patient Instructions (Signed)
If you are age 64 or older, your body mass index should be between 23-30. Your Body mass index is 29.7 kg/m. If this is out of the aforementioned range listed, please consider follow up with your Primary Care Provider.  If you are age 29 or younger, your body mass index should be between 19-25. Your Body mass index is 29.7 kg/m. If this is out of the aformentioned range listed, please consider follow up with your Primary Care Provider.   A new script for Osmolite and syringes will be sent to [facility].  Follow up with Dr. Lavon Paganini as needed

## 2020-03-27 NOTE — Progress Notes (Signed)
Subjective:    Patient ID: Kaitlyn Powell, female    DOB: 09-16-1955, 64 y.o.   MRN: [id number]  HPI Kaitlyn Powell is a 64 year old white female, established with Dr. Lavon Paganini who was last seen in August 2019 for constipation.  She is brought in by her family today to discuss ongoing issues with constipation, relatively new diagnosis of Parkinson's disease, patient has had a PEG placed in the interim since last office visit and family in need of prescriptions for supplements etc. Patient has history of Down syndrome, is nonverbal.  She was diagnosed with Parkinson's disease in October 2020, she has prior history of pulmonary emboli, aspiration pneumonia, seizure disorder, heart block status post pacemaker. She had had a recent ER visit after outpatient abdominal films had shown free air under the right diaphragm.  Subsequent CT scan in the emergency room July 2020 was unremarkable with no evidence of free air. She had a prolonged hospitalization 09/21/2019 through 10/12/2019 with aspiration pneumonia.  She has been on G-tube feedings with Osmolite.  Family relates that they were discharged from the hospital on 5-1/2 cans/day. She had subsequently undergone follow-up speech path swallowing eval which she had passed.  This did show some minimal delay in initiation, reduced oral cohesion and tendency for a large sip size.  She was recommended for nectar thick liquids pured foods and continue pills via PEG. They have since been giving her some p.o. which she has done well with and do not feel that she needs as much Osmolite at this time.  Her weight has been quite stable.  They have reduced her Osmolite generally to 3 cans/day. She continues to have problems with constipation which they are managing very well.  She had had a trial of Linzess in the past which caused diarrhea.  Currently they are using smooth move tea when they flush the PEG tube and also give her senna liquid 5 to 10 cc/day which they titrate  according to bowel movements etc.  They have used enemas on occasion if no bowel movement for 4 to 5 days.  Patient apparently did have colonoscopy 6 to 7 years ago elsewhere which was negative as far as they are aware.  Review of Systems Pertinent positive and negative review of systems were noted in the above HPI section.  All other review of systems was otherwise negative.  Outpatient Encounter Medications as of 03/27/2020  Medication Sig  . albuterol (PROVENTIL) (2.5 MG/3ML) 0.083% nebulizer solution Take 2.5 mg by nebulization every 6 (six) hours as needed for wheezing or shortness of breath.  . cannabidiol (EPIDIOLEX) 100 MG/ML solution Give 3.7 mL twice a day  . carbidopa-levodopa (SINEMET IR) 25-100 MG tablet Place 2 tablets into feeding tube 3 (three) times daily. Take 2 tablet three times a day with meals (Patient taking differently: Place 2 tablets into feeding tube 3 (three) times daily. Take 2 tablet three times a day with meals at (8am, 12 pm, 4pm))  . Elastic Bandages & Supports (GAIT/TRANSFER BELT) MISC 1 Product by Does not apply route daily.  . folic acid (FOLVITE) 1 MG tablet Take 1 mg by mouth daily.  . furosemide (LASIX) 20 MG tablet Place 1 tablet (20 mg total) into feeding tube daily as needed for fluid or edema (to maintain dry weight.).  Marland Kitchen levETIRAcetam (KEPPRA) 100 MG/ML solution Give 80mL (500mg ) in AM, 23mL (1000mg ) in PM  . levothyroxine (SYNTHROID) 150 MCG tablet Place 150 mcg into feeding tube daily before breakfast.  . NONFORMULARY  OR COMPOUNDED ITEM Antifungal solution: Terbinafine 3%, Fluconazole 2%, Tea Tree Oil 5%, Urea 10%, Ibuprofen 2% in DMSO suspension #10930mL  . OXYGEN Inhale 3 L into the lungs as directed. With CPAP   . phenytoin (DILANTIN) 125 MG/5ML suspension Give 8mL (200mg ) twice a day by PEG tube  . Sennosides (SENNA) 8.8 MG/5ML SYRP TAKE 10 MLS BY MOUTH DAILY AS NEEDED  . simvastatin (ZOCOR) 40 MG tablet TAKE 40 MG BY MOUTH DAILY AT BEDTIME (Patient  taking differently: Take 40 mg by mouth at bedtime. )  . sodium chloride (MURO 128) 5 % ophthalmic ointment Place 1 application into both eyes at bedtime.  . vitamin B-12 (CYANOCOBALAMIN) 100 MCG tablet Place 100 mcg into feeding tube daily.   Kaitlyn Powell. XARELTO 20 MG TABS tablet TAKE 1 TABLET (20 MG TOTAL) BY MOUTH DAILY WITH SUPPER. (Patient taking differently: Take 20 mg by mouth daily. )  . ipratropium (ATROVENT) 0.02 % nebulizer solution Take 2.5 mLs (0.5 mg total) by nebulization every 4 (four) hours as needed for wheezing or shortness of breath.  . Nutritional Supplements (FEEDING SUPPLEMENT, OSMOLITE 1.2 CAL,) LIQD Place 237 mLs into feeding tube in the morning, at noon, and at bedtime.  . SYRINGE DISPOSABLE 60CC 60 ML MISC 1 syringe per feeding three times a day  . [DISCONTINUED] albuterol (PROVENTIL) (2.5 MG/3ML) 0.083% nebulizer solution Take 3 mLs (2.5 mg total) by nebulization every 4 (four) hours as needed for wheezing or shortness of breath.  . [DISCONTINUED] levothyroxine (SYNTHROID) 150 MCG tablet Place 1 tablet (150 mcg total) into feeding tube daily before breakfast.   No facility-administered encounter medications on file as of 03/27/2020.   Allergies  Allergen Reactions  . Cephalexin Other (See Comments)    Seizures   . Namenda [Memantine Hcl] Other (See Comments)    Made memory issues worse, sleepiness, increased confusion   Patient Active Problem List   Diagnosis Date Noted  . Left ankle pain 02/20/2020  . Gastrostomy status (HCC)   . Moderate malnutrition (HCC)   . Acute respiratory failure with hypoxia (HCC)   . Dysphagia   . Advanced care planning/counseling discussion   . Goals of care, counseling/discussion   . Palliative care by specialist   . Cellulitis of left hand 04/04/2019  . Left hand pain 04/04/2019  . Heart block AV complete (HCC) 01/19/2019  . Shortness of breath 01/19/2019  . Chronic deep vein thrombosis (DVT) of right popliteal vein (HCC) 09/28/2018  .  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 Aspiration pneumonia (HCC) 08/01/2018  . Dehydration 08/01/2018  . Toxic metabolic encephalopathy 08/01/2018  . Functional urinary incontinence 07/22/2017  . OSA treated with BiPAP 12/31/2016  . Mixed conductive and sensorineural hearing loss of both ears 08/05/2016  . Seizures (HCC) 07/03/2016  . Bilateral hearing loss 07/03/2016  . Down syndrome 05/10/2016  . Anemia due to medication 05/10/2016  . Acquired hypothyroidism 05/10/2016  . Elevated lipase 05/10/2016  . History of pulmonary embolism 05/10/2016  . G tube feedings (HCC) 05/10/2016  . Pacemaker 05/10/2016  . S/P percutaneous endoscopic gastrostomy (PEG) tube placement (HCC) 05/10/2016   Social History   Socioeconomic History  . Marital status: Single    Spouse name: Not on file  . Number of children: Not on file  . Years of education: Not on file  . Highest education level: Not on file  Occupational History  . Occupation: disabled  Tobacco  Use  . Smoking status: Never Smoker  . Smokeless tobacco: Never Used  Vaping Use  . Vaping Use: Never used  Substance and Sexual Activity  . Alcohol use: No  . Drug use: No  . Sexual activity: Never  Other Topics Concern  . Not on file  Social History Narrative   Pt is left handed   Lives in 2 story home with her sister, brother-in-law and BIL's brother (pt stays on bottom floor)   Social Determinants of Health   Financial Resource Strain:   . Difficulty of Paying Living Expenses: Not on file  Food Insecurity:   . Worried About Programme researcher, broadcasting/film/video in the Last Year: Not on file  . Ran Out of Food in the Last Year: Not on file  Transportation Needs:   . Lack of Transportation (Medical): Not on file  . Lack of Transportation (Non-Medical): Not on file  Physical Activity:   . Days of Exercise per Week:  Not on file  . Minutes of Exercise per Session: Not on file  Stress:   . Feeling of Stress : Not on file  Social Connections:   . Frequency of Communication with Friends and Family: Not on file  . Frequency of Social Gatherings with Friends and Family: Not on file  . Attends Religious Services: Not on file  . Active Member of Clubs or Organizations: Not on file  . Attends Banker Meetings: Not on file  . Marital Status: Not on file  Intimate Partner Violence:   . Fear of Current or Ex-Partner: Not on file  . Emotionally Abused: Not on file  . Physically Abused: Not on file  . Sexually Abused: Not on file    Ms. Farinas's family history includes Breast cancer in her sister; Diabetes in her father; Heart disease in her father.      Objective:    Vitals:   03/27/20 1054  BP: 100/60  Pulse: (!) 50    Physical Exam Well-developed well-nourished older female, nonverbal, in wheelchair and accompanied by family members.  In no acute distress.  Height, Weight 142, BMI 29.7  HEENT; nontraumatic normocephalic, EOMI, PE RR LA, sclera anicteric.,  Keeps eyes closed Oropharynx; not done today Neck; supple, no JVD Cardiovascular; regular rate and rhythm with S1-S2, no murmur rub or gallop Pulmonary; Clear bilaterally Abdomen; soft, nontender, nondistended, no palpable mass or hepatosplenomegaly, bowel sounds are active Rectal; not done today Skin; benign exam, no jaundice rash or appreciable lesions Extremities; no clubbing cyanosis or edema skin warm and dry Neuro/Psych; alert , nonverbal.       Assessment & Plan:   #13 64 year old white female with Down syndrome, nonverbal, with seizure disorder and new diagnosis of Parkinson's disease October 2020. 2.  History of prior DVT/PE on chronic Xarelto 3.  Hospitalization February 2021 with aspiration pneumonia 4.  PEG tube in situ, patient also receiving oral feedings  #5 mild pharyngeal phase dysphagia-patient on pured  to very soft diet and thick liquids. #6 chronic constipation #7 sleep apnea- #8 colon cancer screening-negative colonoscopy by history 6 to 7 years ago- poor candidate for follow-up colonoscopy  Plan; we will decrease Osmolite to 3 cans/day, prescription given Family also requested prescription for syringes for PEG feedings, 60 cc straight tip syringes 12/month sent For now continue senna 5 to 10 cc daily titrated as needed, as needed fleets enemas. Okay to continue smooth move tea for PEG flushes. Follow-up with Dr. Lavon Paganini or myself as needed.  Cedrik Heindl S Kayleann Mccaffery PA-C 03/27/2020   Cc: Tracey Harries, MD

## 2020-03-28 ENCOUNTER — Telehealth: Payer: Self-pay | Admitting: Pulmonary Disease

## 2020-03-28 NOTE — Telephone Encounter (Signed)
Patient's sister called with advise regarding the [facility] recall. Family advised to weigh the risk and benefits of stopping therapy. Patient has been on CPAP for many years with mild obstructive sleep apnea and Down Syndrome. Family feels it is important for her to continue using her machine, they are going to hand wash supplies and monitor the tubing closely.

## 2020-03-29 NOTE — Progress Notes (Signed)
Reviewed and agree with documentation and assessment and plan. K. Veena Journei Thomassen , MD   

## 2020-04-04 ENCOUNTER — Encounter: Payer: Self-pay | Admitting: Pulmonary Disease

## 2020-04-04 ENCOUNTER — Telehealth: Payer: Self-pay | Admitting: Pulmonary Disease

## 2020-04-04 ENCOUNTER — Ambulatory Visit (INDEPENDENT_AMBULATORY_CARE_PROVIDER_SITE_OTHER): Payer: Medicare Other | Admitting: Pulmonary Disease

## 2020-04-04 ENCOUNTER — Other Ambulatory Visit: Payer: Self-pay

## 2020-04-04 ENCOUNTER — Ambulatory Visit (INDEPENDENT_AMBULATORY_CARE_PROVIDER_SITE_OTHER): Payer: Medicare Other

## 2020-04-04 VITALS — BP 120/70 | HR 78 | Temp 97.6°F | Ht <= 58 in | Wt 142.0 lb

## 2020-04-04 DIAGNOSIS — J690 Pneumonitis due to inhalation of food and vomit: Secondary | ICD-10-CM

## 2020-04-04 DIAGNOSIS — R6889 Other general symptoms and signs: Secondary | ICD-10-CM

## 2020-04-04 DIAGNOSIS — G4733 Obstructive sleep apnea (adult) (pediatric): Secondary | ICD-10-CM

## 2020-04-04 DIAGNOSIS — R1310 Dysphagia, unspecified: Secondary | ICD-10-CM

## 2020-04-04 NOTE — Patient Instructions (Signed)
You were seen today by Coral Ceo, NP  for:   1. OSA treated with BiPAP  We recommend that you continue using your BIPAP daily >>>Keep up the hard work using your device >>> Goal should be wearing this for the entire night that you are sleeping, at least 4 to 6 hours  Remember:  . Do not drive or operate heavy machinery if tired or drowsy.  . Please notify the supply company and office if you are unable to use your device regularly due to missing supplies or machine being broken.  . Work on maintaining a healthy weight and following your recommended nutrition plan  . Maintain proper daily exercise and movement  . Maintaining proper use of your device can also help improve management of other chronic illnesses such as: Blood pressure, blood sugars, and weight management.   BiPAP/ CPAP Cleaning:  >>>Clean weekly, with Dawn soap, and bottle brush.  Set up to air dry. >>> Wipe mask out daily with wet wipe or towelette  Please drop off the SD card so we can obtain compliance of your BiPAP  2. Dysphagia, unspecified type 3. Congestion of throat  - DG Chest 2 View; Future   I will review your case with Dr. Everardo All.  After we receive the chest x-ray can consider antibiotic treatment or potential evaluation work-up due to increased sputum production  We recommend today:  Orders Placed This Encounter  Procedures  . DG Chest 2 View    Standing Status:   Future    Standing Expiration Date:   08/04/2020    Order Specific Question:   Reason for Exam (SYMPTOM  OR DIAGNOSIS REQUIRED)    Answer:   cough, congestion    Order Specific Question:   Preferred imaging location?    Answer:   Internal    Order Specific Question:   Radiology Contrast Protocol - do NOT remove file path    Answer:   [email or web address]   Orders Placed This Encounter  Procedures  . DG Chest 2 View   No orders of the defined types were placed in this  encounter.   Follow Up:    No follow-ups on file.   Please do your part to reduce the spread of COVID-19:      Reduce your risk of any infection  and COVID19 by using the similar precautions used for avoiding the common cold or flu:  Marland Kitchen Wash your hands often with soap and warm water for at least 20 seconds.  If soap and water are not readily available, use an alcohol-based hand sanitizer with at least 60% alcohol.  . If coughing or sneezing, cover your mouth and nose by coughing or sneezing into the elbow areas of your shirt or coat, into a tissue or into your sleeve (not your hands). Drinda Butts A MASK when in public  . Avoid shaking hands with others and consider head nods or verbal greetings only. . Avoid touching your eyes, nose, or mouth with unwashed hands.  . Avoid close contact with people who are sick. . Avoid places or events with large numbers of people in one location, like concerts or sporting events. . If you have some symptoms but not all symptoms, continue to monitor at home and seek medical attention if your symptoms worsen. . If you are having a medical emergency, call 911.   ADDITIONAL HEALTHCARE OPTIONS FOR PATIENTS  [place] [facility] / e-Visit: [email or web address]         [facility]: [phone]  [facility]: [phone]                   [facility]: [phone]     It is flu season:   >>> Best ways to protect herself from the flu: Receive the yearly flu vaccine, practice good hand hygiene washing with soap and also using hand sanitizer when available, eat a nutritious meals, get adequate rest, hydrate appropriately   Please contact the office if your symptoms worsen or you have concerns that you are not improving.   Thank you for choosing [facility] for your healthcare, and for allowing [place] to partner with you on your healthcare journey. I am thankful to be  able to provide care to you today.   Elisha Headland FNP-C

## 2020-04-04 NOTE — Assessment & Plan Note (Signed)
Plan: Continue BiPAP therapy Requested family to bring in SD card to check compliance

## 2020-04-04 NOTE — Progress Notes (Signed)
@Patient  ID: , female    DOB: [birth year], 64 y.o.   MRN: [id number]  Chief Complaint  Patient presents with   Follow-up    pt is coughing up mucus it is clear in color.wants to make sur nom aspiration    Referring provider: [id number], MD  HPI:  64 year old female never smoker followed in our office for obstructive sleep apnea and at risk for aspiration pneumonia  PMH: Down syndrome, seizures, G-tube in place, heart block, dysphagia Smoker/ Smoking History: Never smoker Maintenance: None Pt of: Dr. 64  04/04/2020  - Visit   64 year old female never smoker followed in our office for obstructive sleep apnea and at risk for aspiration pneumonia.  Patient presenting to office today as an acute visit.  Over the last 1 to 2 weeks we have seen increased sputum production as well as thicker secretions.  They are unsure if the patient may be having dehydration.  She has been receiving 250 cc every 6 hours through her G-tube.  No current temperatures.  No abnormal sputum color.  She has been using her BiPAP every night with no current issues.  No recent antibiotics.  Patient primarily using G-tube feeding.  Tests:   FENO:  No results found for: NITRICOXIDE  PFT: No flowsheet data found.  WALK:  SIX MIN WALK 02/11/2017  Supplimental Oxygen during Test? (L/min) No    Imaging: DG Chest 2 View  Result Date: 04/04/2020 CLINICAL DATA:  Cough and chest congestion EXAM: CHEST - 2 VIEW COMPARISON:  11/13/2019 FINDINGS: Dual lead pacemaker as seen previously. Heart size is normal. Aortic atherosclerosis. Chronic pulmonary scarring as seen on multiple previous studies. No definite active infiltrate, mass, effusion or collapse. Low level pulmonary inflammation could be hidden within the chronic markings. IMPRESSION: No active process evident. Chronic pulmonary scarring. Low level pulmonary inflammation could be hidden within the chronic markings. Electronically Signed   By:  01/13/2020 M.D.   On: 04/04/2020 12:36   IR Replc Gastro/Colonic Tube Percut W/Fluoro  Result Date: 03/19/2020 INDICATION: 64 year old female with an indwelling percutaneous low-profile gastrostomy tube. She presents for exchange for a longer stoma length tube. EXAM: GASTROSTOMY CATHETER REPLACEMENT MEDICATIONS: None ANESTHESIA/SEDATION: None CONTRAST:  None FLUOROSCOPY TIME:  None COMPLICATIONS: None immediate. PROCEDURE: Informed written consent was obtained from the patient after a thorough discussion of the procedural risks, benefits and alternatives. All questions were addressed. Maximal Sterile Barrier Technique was utilized including caps, mask, sterile gowns, sterile gloves, sterile drape, hand hygiene and skin antiseptic. A timeout was performed prior to the initiation of the procedure. The retention balloon of the existing 3 cm stoma length gastrostomy tube was deflated. The tube was removed without issue. The new 4 cm stoma length mic key low-profile percutaneous gastrostomy tube was lubricated and advanced through the stoma and into the stomach. The 10 mL retention balloon was filled with 7 mL saline. The patient tolerated the procedure well. IMPRESSION: Successful exchange for a new 4 cm stoma length mic key low-profile percutaneous gastrostomy tube. Electronically Signed   By: 64 M.D.   On: 03/19/2020 14:32    Lab Results:  CBC    Component Value Date/Time   WBC 6.3 02/22/2020 2016   RBC 3.05 (L) 02/22/2020 2016   HGB 11.1 (L) 02/22/2020 2016   HGB CANCELED 04/07/2019 0000   HCT 33.2 (L) 02/22/2020 2016   HCT CANCELED 04/07/2019 0000   PLT 123 (L) 02/22/2020 2016   PLT CANCELED 04/07/2019 0000  MCV 108.9 (H) 02/22/2020 2016   MCV 104 (H) 06/21/2018 0935   MCH 36.4 (H) 02/22/2020 2016   MCHC 33.4 02/22/2020 2016   RDW 12.3 02/22/2020 2016   RDW 12.2 (L) 06/21/2018 0935   LYMPHSABS 1.3 02/22/2020 2016   LYMPHSABS CANCELED 04/07/2019 0000   MONOABS 0.8  02/22/2020 2016   EOSABS 0.1 02/22/2020 2016   EOSABS CANCELED 04/07/2019 0000   BASOSABS 0.1 02/22/2020 2016   BASOSABS CANCELED 04/07/2019 0000    BMET    Component Value Date/Time   NA 134 (L) 02/22/2020 2016   NA 129 (L) 12/28/2018 1333   K 4.9 02/22/2020 2016   CL 94 (L) 02/22/2020 2016   CO2 28 02/22/2020 2016   GLUCOSE 96 02/22/2020 2016   BUN 37 (H) 02/22/2020 2016   BUN 15 12/28/2018 1333   CREATININE 0.85 02/22/2020 2016   CREATININE 0.91 10/27/2019 1618   CALCIUM 9.2 02/22/2020 2016   GFRNONAA >60 02/22/2020 2016   GFRAA >60 02/22/2020 2016    BNP    Component Value Date/Time   BNP 230.3 (H) 09/28/2019 0300    ProBNP    Component Value Date/Time   PROBNP 86.0 09/21/2018 1050    Specialty Problems      Pulmonary Problems   OSA treated with BiPAP    01/2017 ONO on BIPAP with 2L> O2 sat < 88% for an hour 03/2017 CPAP titration > 14 cm H20, still had very low oxygen desaturation to 70% range      Aspiration pneumonia (HCC)   Shortness of breath   Acute respiratory failure with hypoxia (HCC)      Allergies  Allergen Reactions   Cephalexin Other (See Comments)    Seizures    Namenda [Memantine Hcl] Other (See Comments)    Made memory issues worse, sleepiness, increased confusion    Immunization History  Administered Date(s) Administered   Influenza Inj Mdck Quad Pf 04/25/2019   Influenza Split 05/03/2016, 04/16/2017   Influenza, Quadrivalent, Recombinant, Inj, Pf 04/25/2019   Influenza, Seasonal, Injecte, Preservative Fre 05/03/2016, 04/16/2017, 05/31/2017   Influenza,inj,Quad PF,6+ Mos 04/22/2017, 05/02/2018   Influenza-Unspecified 05/03/2016, 05/03/2019   Janssen (J&J) SARS-COV-2 Vaccination 11/04/2019   Pneumococcal Polysaccharide-23 07/19/2017   Pneumococcal-Unspecified 07/19/2017   Tdap 11/01/2014    Past Medical History:  Diagnosis Date   Arthritis    Aspiration pneumonia (HCC)    Asthma    Cardiac arrhythmia     have pacemeaker   Down syndrome    DVT of popliteal vein (HCC) 09/08/2018   chronic   GERD (gastroesophageal reflux disease)    Heart block    Hypothyroidism    Mental retardation    Pacemaker    Parkinson's disease (HCC) 05/2019   Pulmonary emboli (HCC)    Seizures (HCC)    Sleep apnea with use of continuous positive airway pressure (CPAP)     Tobacco History: Social History   Tobacco Use  Smoking Status Never Smoker  Smokeless Tobacco Never Used   Counseling given: Yes   Continue to not smoke  Outpatient Encounter Medications as of 04/04/2020  Medication Sig   albuterol (PROVENTIL) (2.5 MG/3ML) 0.083% nebulizer solution Take 2.5 mg by nebulization every 6 (six) hours as needed for wheezing or shortness of breath.   cannabidiol (EPIDIOLEX) 100 MG/ML solution Give 3.7 mL twice a day   carbidopa-levodopa (SINEMET IR) 25-100 MG tablet Place 2 tablets into feeding tube 3 (three) times daily. Take 2 tablet three times a day with meals (  Patient taking differently: Place 2 tablets into feeding tube 3 (three) times daily. Take 2 tablet three times a day with meals at (8am, 12 pm, 4pm))   Elastic Bandages & Supports (GAIT/TRANSFER BELT) MISC 1 Product by Does not apply route daily.   folic acid (FOLVITE) 1 MG tablet Take 1 mg by mouth daily.   furosemide (LASIX) 20 MG tablet Place 1 tablet (20 mg total) into feeding tube daily as needed for fluid or edema (to maintain dry weight.).   levETIRAcetam (KEPPRA) 100 MG/ML solution Give 5mL (500mg ) in AM, 10mL (1000mg ) in PM   levothyroxine (SYNTHROID) 150 MCG tablet Place 150 mcg into feeding tube daily before breakfast.   NONFORMULARY OR COMPOUNDED ITEM Antifungal solution: Terbinafine 3%, Fluconazole 2%, Tea Tree Oil 5%, Urea 10%, Ibuprofen 2% in DMSO suspension #530mL   Nutritional Supplements (FEEDING SUPPLEMENT, OSMOLITE 1.2 CAL,) LIQD Place 237 mLs into feeding tube in the morning, at noon, and at bedtime.   OXYGEN  Inhale 3 L into the lungs as directed. With CPAP    phenytoin (DILANTIN) 125 MG/5ML suspension Give 8mL (200mg ) twice a day by PEG tube   Sennosides (SENNA) 8.8 MG/5ML SYRP TAKE 10 MLS BY MOUTH DAILY AS NEEDED   simvastatin (ZOCOR) 40 MG tablet TAKE 40 MG BY MOUTH DAILY AT BEDTIME (Patient taking differently: Take 40 mg by mouth at bedtime. )   sodium chloride (MURO 128) 5 % ophthalmic ointment Place 1 application into both eyes at bedtime.   SYRINGE DISPOSABLE 60CC 60 ML MISC 1 syringe per feeding three times a day   vitamin B-12 (CYANOCOBALAMIN) 100 MCG tablet Place 100 mcg into feeding tube daily.    XARELTO 20 MG TABS tablet TAKE 1 TABLET (20 MG TOTAL) BY MOUTH DAILY WITH SUPPER. (Patient taking differently: Take 20 mg by mouth daily. )   ipratropium (ATROVENT) 0.02 % nebulizer solution Take 2.5 mLs (0.5 mg total) by nebulization every 4 (four) hours as needed for wheezing or shortness of breath.   No facility-administered encounter medications on file as of 04/04/2020.     Review of Systems  Review of Systems  Constitutional: Negative for activity change, fatigue and fever.  HENT: Positive for congestion (Clear sputum). Negative for sinus pressure, sinus pain and sore throat.   Respiratory: Negative for cough, shortness of breath and wheezing.   Cardiovascular: Negative for chest pain and palpitations.  Gastrointestinal: Negative for diarrhea, nausea and vomiting.       High risk for aspiration  Musculoskeletal: Negative for arthralgias.  Neurological: Negative for dizziness.  Psychiatric/Behavioral: Negative for sleep disturbance. The patient is not nervous/anxious.      Physical Exam  BP 120/70 (BP Location: Left Arm, Cuff Size: Normal)    Pulse 78    Temp 97.6 F (36.4 C) (Oral)    Ht 4\' 10"  (1.473 m)    Wt 142 lb (64.4 kg)    SpO2 95%    BMI 29.68 kg/m   Wt Readings from Last 5 Encounters:  04/04/20 142 lb (64.4 kg)  03/27/20 142 lb 2 oz (64.5 kg)  02/15/20 159 lb  (72.1 kg)  11/13/19 156 lb 3.2 oz (70.9 kg)  10/12/19 168 lb 10.4 oz (76.5 kg)    BMI Readings from Last 5 Encounters:  04/04/20 29.68 kg/m  03/27/20 29.70 kg/m  02/20/20 33.23 kg/m  02/15/20 33.23 kg/m  11/13/19 32.65 kg/m     Physical Exam Vitals and nursing note reviewed.  Constitutional:      General:  She is not in acute distress.    Appearance: Normal appearance. She is obese.  HENT:     Head: Normocephalic and atraumatic.     Right Ear: Tympanic membrane, ear canal and external ear normal. There is no impacted cerumen.     Left Ear: Tympanic membrane, ear canal and external ear normal. There is no impacted cerumen.     Nose: Nose normal. No congestion.     Mouth/Throat:     Mouth: Mucous membranes are moist.     Pharynx: Oropharynx is clear.  Eyes:     Pupils: Pupils are equal, round, and reactive to light.  Cardiovascular:     Rate and Rhythm: Normal rate and regular rhythm.     Pulses: Normal pulses.     Heart sounds: Normal heart sounds. No murmur heard.   Pulmonary:     Effort: No respiratory distress.     Breath sounds: No decreased air movement. Examination of the right-middle field reveals rales. Examination of the right-lower field reveals decreased breath sounds. Examination of the left-lower field reveals decreased breath sounds. Decreased breath sounds and rales present.     Comments: Low aeration on clinical exam, diminished breath sounds in bases Musculoskeletal:     Cervical back: Normal range of motion.  Skin:    General: Skin is warm and dry.     Capillary Refill: Capillary refill takes less than 2 seconds.  Neurological:     General: No focal deficit present.     Mental Status: She is alert and oriented to person, place, and time. Mental status is at baseline.     Motor: Weakness (in wheelchair ) present.     Gait: Gait abnormal.  Psychiatric:        Mood and Affect: Mood normal.        Behavior: Behavior normal.        Thought Content:  Thought content normal.        Judgment: Judgment normal.       Assessment & Plan:   OSA treated with BiPAP Plan: Continue BiPAP therapy Requested family to bring in SD card to check compliance  Aspiration pneumonia (HCC) History of aspiration pneumonia Crackles in right middle lobe on exam today  Plan: Chest x-ray today Continue aspiration precautions   Dysphagia Plan: Continue aspiration precautions Continue G-tube feedings    Return in about 6 weeks (around 05/16/2020), or if symptoms worsen or fail to improve, for Follow up with Dr. Everardo All.   Coral Ceo, NP 04/04/2020   This appointment required 32 minutes of patient care (this includes precharting, chart review, review of results, face-to-face care, etc.).

## 2020-04-04 NOTE — Telephone Encounter (Signed)
Ave Filter, CMA  04/04/2020 3:35 PM EDT     Pt has been informed of cxr result s to keep f/u with Dr.Ellison.pt feeding schedule is 8 am ,12 pm ,430 pm, 6pm and solely gtube feeding   Coral Ceo, NP  04/04/2020 2:17 PM EDT     Please let patient know that I have discussed case with Dr. Everardo All. We have reviewed the chest x-ray. No active acute pulmonary concerns at this time. No aspiration pneumonia. This is reassuring.  We do not believe antibiotics are needed at this time.  Would recommend having follow-up with neurology.  As far as her sputum production can start Mucinex 600 mg daily to see if this can help with thinning of mucus. Make sure that you are hydrating well.  When is the patient's feet schedules? She is solely being fed through her G-tube?  Keep follow-up with Dr. Everardo All next month.  Elisha Headland, FNP     This has already been handled. Nothing further needed at this time- will close encounter.

## 2020-04-04 NOTE — Assessment & Plan Note (Signed)
History of aspiration pneumonia Crackles in right middle lobe on exam today  Plan: Chest x-ray today Continue aspiration precautions

## 2020-04-04 NOTE — Assessment & Plan Note (Signed)
Plan: Continue aspiration precautions Continue G-tube feedings

## 2020-04-05 ENCOUNTER — Telehealth: Payer: Self-pay | Admitting: Pulmonary Disease

## 2020-04-05 NOTE — Telephone Encounter (Signed)
Download printed and card returned to family member.  Download placed in Dr. Shirlee More look-at.

## 2020-04-07 NOTE — Progress Notes (Signed)
Reviewed note. Discussed care with NP. Agree with plan as outlined.  Mechele Collin, M.D. [facility] 04/07/2020 2:35 PM

## 2020-04-16 ENCOUNTER — Other Ambulatory Visit: Payer: Self-pay | Admitting: Neurology

## 2020-04-16 ENCOUNTER — Encounter: Payer: Self-pay | Admitting: Neurology

## 2020-04-16 DIAGNOSIS — F0280 Dementia in other diseases classified elsewhere without behavioral disturbance: Secondary | ICD-10-CM

## 2020-04-16 DIAGNOSIS — R2681 Unsteadiness on feet: Secondary | ICD-10-CM

## 2020-04-18 ENCOUNTER — Other Ambulatory Visit: Payer: Self-pay

## 2020-04-18 ENCOUNTER — Telehealth: Payer: Self-pay | Admitting: Physician Assistant

## 2020-04-18 MED ORDER — OSMOLITE PO LIQD: ORAL | 12 refills | Status: DC

## 2020-04-18 MED ORDER — SYRINGE DISPOSABLE 60 ML MISC: 12 refills | Status: AC

## 2020-04-18 MED ORDER — EXTENSION TUBING/CONNECTOR MISC: 11 refills | Status: AC

## 2020-04-18 NOTE — Telephone Encounter (Signed)
Spoke with the patient's guardian. The patient was seen 03/27/20. The prescriptions for the decrease Osmolite feedings, the 60 ml Bolus syringes and the 6 extension tubes (Mic-key) were not received by [facility] Confirmed a fax number of [phone]. Reprinted and faxed the prescriptions.  Spoke with [facility] at [phone]. Alternate fax number is [phone].

## 2020-04-22 ENCOUNTER — Telehealth: Payer: Self-pay | Admitting: Gastroenterology

## 2020-04-22 ENCOUNTER — Other Ambulatory Visit: Payer: Self-pay

## 2020-04-22 MED ORDER — OSMOLITE PO LIQD: 1.0000 | Freq: Three times a day (TID) | ORAL | 12 refills | Status: AC

## 2020-04-22 NOTE — Telephone Encounter (Signed)
Patients sister calling to follow up on previous call

## 2020-04-22 NOTE — Telephone Encounter (Addendum)
Spoke with Enteral Dept at [facility] Discussed previous order which was shipped out 04/03/20 quantity of 25 cans. Agent is not sure why this quantity. The original order was for 5.5 cans per day. No record of the new order for 3 cans per day. Confirmed the fax number of [phone]. Prescription for 3 cans per day faxed again.

## 2020-04-22 NOTE — Telephone Encounter (Signed)
Called the Enteral dept again. 15 minute hold. Spoke with Morrie Sheldon. Rx has not been uploaded yet. She is sending a message to the department that is in charge of uploading the faxes and has asked they give the prescription priority.

## 2020-04-22 NOTE — Telephone Encounter (Signed)
Patient's sister called in reference to the prescription fax that was not received.

## 2020-04-23 ENCOUNTER — Ambulatory Visit: Payer: Medicare Other | Admitting: Neurology

## 2020-04-23 NOTE — Telephone Encounter (Signed)
Spoke with [place]. Confirmed the Rx for the Osmolite from yesterday has been uploaded. Sister of the patient notified.

## 2020-04-24 NOTE — Telephone Encounter (Signed)
Dr. Mannam, please advise. 

## 2020-04-26 NOTE — Telephone Encounter (Signed)
Thank you   Kaitlyn Powell  

## 2020-04-26 NOTE — Telephone Encounter (Signed)
Reviewed compliance download dated 04/04/2020 100% compliant with average use of 9.5 hrs  BiPAP device set at 20/5 Average AHI 15.9  Patient is compliant with her BiPAP Needs to follow-up with her primary pulmonologist Dr. Everardo All.  Report scanned into the computer  Chilton Greathouse MD [facility] 04/26/2020, 10:22 AM

## 2020-04-26 NOTE — Telephone Encounter (Signed)
Patient has appt on 05/15/20 with JE  Nothing further needed at this time

## 2020-05-06 ENCOUNTER — Ambulatory Visit (INDEPENDENT_AMBULATORY_CARE_PROVIDER_SITE_OTHER): Payer: Medicare Other

## 2020-05-06 DIAGNOSIS — I442 Atrioventricular block, complete: Secondary | ICD-10-CM

## 2020-05-06 LAB — CUP PACEART REMOTE DEVICE CHECK
Battery Remaining Longevity: 84 mo
Battery Remaining Percentage: 80 %
Brady Statistic RA Percent Paced: 18 %
Brady Statistic RV Percent Paced: 11 %
Date Time Interrogation Session: 20211004023200
Implantable Lead Implant Date: 20170508
Implantable Lead Implant Date: 20170508
Implantable Lead Location: 753859
Implantable Lead Location: 753860
Implantable Lead Model: 7740
Implantable Lead Model: 7741
Implantable Lead Serial Number: 1111
Implantable Lead Serial Number: 1111
Implantable Pulse Generator Implant Date: 20170508
Lead Channel Impedance Value: 627 Ohm
Lead Channel Impedance Value: 645 Ohm
Lead Channel Pacing Threshold Amplitude: 1.1 V
Lead Channel Pacing Threshold Pulse Width: 0.4 ms
Lead Channel Setting Pacing Amplitude: 2 V
Lead Channel Setting Pacing Amplitude: 5 V
Lead Channel Setting Pacing Pulse Width: 1 ms
Lead Channel Setting Sensing Sensitivity: 2.5 mV
Pulse Gen Serial Number: 747619

## 2020-05-07 ENCOUNTER — Telehealth: Payer: Self-pay

## 2020-05-07 DIAGNOSIS — R4182 Altered mental status, unspecified: Secondary | ICD-10-CM

## 2020-05-07 DIAGNOSIS — R413 Other amnesia: Secondary | ICD-10-CM

## 2020-05-07 NOTE — Telephone Encounter (Signed)
Spoke to pt sister [name] going to bring her until thurs, and she does not want to hold her dilantin and isnt going to hold it,, she will bring her after a dentist appointment around 2:30 or 3 pm on Thursday to have lab work done that Dr Karel Jarvis ordered

## 2020-05-08 ENCOUNTER — Telehealth: Payer: Self-pay

## 2020-05-08 ENCOUNTER — Other Ambulatory Visit: Payer: Medicare Other

## 2020-05-08 ENCOUNTER — Other Ambulatory Visit (INDEPENDENT_AMBULATORY_CARE_PROVIDER_SITE_OTHER): Payer: Medicare Other

## 2020-05-08 ENCOUNTER — Other Ambulatory Visit: Payer: Self-pay

## 2020-05-08 DIAGNOSIS — R4182 Altered mental status, unspecified: Secondary | ICD-10-CM

## 2020-05-08 DIAGNOSIS — R413 Other amnesia: Secondary | ICD-10-CM | POA: Diagnosis not present

## 2020-05-08 LAB — URINALYSIS, ROUTINE W REFLEX MICROSCOPIC
Bilirubin Urine: NEGATIVE
Hgb urine dipstick: NEGATIVE
Ketones, ur: NEGATIVE
Nitrite: POSITIVE — AB
Specific Gravity, Urine: 1.015 (ref 1.000–1.030)
Total Protein, Urine: NEGATIVE
Urine Glucose: NEGATIVE
Urobilinogen, UA: 0.2 (ref 0.0–1.0)
pH: 7 (ref 5.0–8.0)

## 2020-05-08 NOTE — Telephone Encounter (Signed)
Spoke to pt sister and informed her that urinalysis looks like there is a UTI. Pt sister informed that Results will be faxed to PCP

## 2020-05-08 NOTE — Telephone Encounter (Signed)
-----   Message from Van Clines, MD sent at 05/08/2020  3:50 PM EDT ----- Pls let sister know her urinalysis looks like there is a UTI. Pls send results to PCP and have her contact PCP for treatment, thanks

## 2020-05-08 NOTE — Progress Notes (Signed)
Remote pacemaker transmission.   

## 2020-05-09 LAB — COMPREHENSIVE METABOLIC PANEL
ALT: 4 U/L (ref 0–35)
AST: 18 U/L (ref 0–37)
Albumin: 3.6 g/dL (ref 3.5–5.2)
Alkaline Phosphatase: 86 U/L (ref 39–117)
BUN: 40 mg/dL — ABNORMAL HIGH (ref 6–23)
CO2: 32 mEq/L (ref 19–32)
Calcium: 9.4 mg/dL (ref 8.4–10.5)
Chloride: 93 mEq/L — ABNORMAL LOW (ref 96–112)
Creatinine, Ser: 1.13 mg/dL (ref 0.40–1.20)
GFR: 51.33 mL/min — ABNORMAL LOW (ref >60.00)
Glucose, Bld: 89 mg/dL (ref 70–99)
Potassium: 4.4 mEq/L (ref 3.5–5.1)
Sodium: 131 mEq/L — ABNORMAL LOW (ref 135–145)
Total Bilirubin: 0.3 mg/dL (ref 0.2–1.2)
Total Protein: 8.4 g/dL — ABNORMAL HIGH (ref 6.0–8.3)

## 2020-05-09 LAB — CBC
HCT: 33.9 % — ABNORMAL LOW (ref 36.0–46.0)
Hemoglobin: 11.6 g/dL — ABNORMAL LOW (ref 12.0–15.0)
MCHC: 34.1 g/dL (ref 30.0–36.0)
MCV: 108.5 fl — ABNORMAL HIGH (ref 78.0–100.0)
Platelets: 122 10*3/uL — ABNORMAL LOW (ref 150.0–400.0)
RBC: 3.13 Mil/uL — ABNORMAL LOW (ref 3.87–5.11)
RDW: 13.4 % (ref 11.5–15.5)
WBC: 4.3 10*3/uL (ref 4.0–10.5)

## 2020-05-10 LAB — PHENYTOIN LEVEL, TOTAL: Phenytoin, Total: 21.1 mg/L — ABNORMAL HIGH (ref 10.0–20.0)

## 2020-05-15 ENCOUNTER — Other Ambulatory Visit: Payer: Self-pay

## 2020-05-15 ENCOUNTER — Ambulatory Visit (INDEPENDENT_AMBULATORY_CARE_PROVIDER_SITE_OTHER): Payer: Medicare Other | Admitting: Pulmonary Disease

## 2020-05-15 ENCOUNTER — Telehealth: Payer: Self-pay | Admitting: Pulmonary Disease

## 2020-05-15 ENCOUNTER — Encounter: Payer: Self-pay | Admitting: Pulmonary Disease

## 2020-05-15 ENCOUNTER — Other Ambulatory Visit: Payer: Self-pay | Admitting: Pulmonary Disease

## 2020-05-15 VITALS — BP 110/50 | HR 50 | Temp 97.6°F | Ht <= 58 in | Wt 142.0 lb

## 2020-05-15 DIAGNOSIS — J45901 Unspecified asthma with (acute) exacerbation: Secondary | ICD-10-CM

## 2020-05-15 DIAGNOSIS — Z23 Encounter for immunization: Secondary | ICD-10-CM | POA: Diagnosis not present

## 2020-05-15 MED ORDER — IPRATROPIUM-ALBUTEROL 0.5-2.5 (3) MG/3ML IN SOLN: 3.0000 mL | Freq: Four times a day (QID) | RESPIRATORY_TRACT | 12 refills | Status: DC | PRN

## 2020-05-15 MED ORDER — BUDESONIDE 0.5 MG/2ML IN SUSP
0.5000 mg | Freq: Two times a day (BID) | RESPIRATORY_TRACT | 0 refills | Status: DC
Start: 2020-05-15 — End: 2020-05-15

## 2020-05-15 MED ORDER — PREDNISONE 10 MG PO TABS
ORAL_TABLET | ORAL | 0 refills | Status: AC
Start: 2020-05-15 — End: 2020-05-27

## 2020-05-15 MED ORDER — BUDESONIDE 0.5 MG/2ML IN SUSP: 0.5000 mg | Freq: Two times a day (BID) | RESPIRATORY_TRACT | 0 refills | Status: DC

## 2020-05-15 NOTE — Progress Notes (Signed)
Subjective:   PATIENT ID: [name] GENDER: female DOB: January 22, 1956, MRN: [id number]   HPI  Chief Complaint  Patient presents with   Follow-up    Thick clear salivia in the am.  coughing every am.  no problems with bipap.  poor sleeper, sleeping during the day.      Reason for Visit: Follow-up for recent hospitalization  Ms. Wreatha Sturgeon is a 64 year old female with Down syndrome, Parkinson's disease and OSA on BiPAP who presents for follow-up.  Patient unable to provide history. Her older sister, [name], and brother-in-law are present to discuss her care.  She was recently seen by NP Cherre Huger on 9/2 for acute visit for increased secretions. Family reports thick clear secretions that are difficult to suction at times. She has had developed wheezing in the last two weeks requiring twice a day Duonebs. She has a weak cough and and unable to clear secretions on her own.  Her nebulizers seem to provide her the most relief.  Family reports her parkinsons and alzheimers is progressing as she seems more drowsy and less interactive.  Her mental status improves with NIV which she is tolerating.  I have personally reviewed patient's past medical/family/social history/allergies/current medications.  Past Medical History:  Diagnosis Date   Arthritis    Aspiration pneumonia (HCC)    Asthma    Cardiac arrhythmia    have pacemeaker   Down syndrome    DVT of popliteal vein (HCC) 09/08/2018   chronic   GERD (gastroesophageal reflux disease)    Heart block    Hypothyroidism    Mental retardation    Pacemaker    Parkinson's disease (HCC) 05/2019   Pulmonary emboli (HCC)    Seizures (HCC)    Sleep apnea with use of continuous positive airway pressure (CPAP)      Family History  Problem Relation Age of Onset   Diabetes Father    Heart disease Father    Breast cancer Sister      Social History   Occupational History   Occupation: disabled  Tobacco Use    Smoking status: Never Smoker   Smokeless tobacco: Never Used  Building services engineer Use: Never used  Substance and Sexual Activity   Alcohol use: No   Drug use: No   Sexual activity: Never    Allergies  Allergen Reactions   Cephalexin Other (See Comments)    Seizures    Namenda [Memantine Hcl] Other (See Comments)    Made memory issues worse, sleepiness, increased confusion     Outpatient Medications Prior to Visit  Medication Sig Dispense Refill   albuterol (PROVENTIL) (2.5 MG/3ML) 0.083% nebulizer solution Take 2.5 mg by nebulization every 6 (six) hours as needed for wheezing or shortness of breath.     cannabidiol (EPIDIOLEX) 100 MG/ML solution Give 3.7 mL twice a day 222 mL 5   carbidopa-levodopa (SINEMET IR) 25-100 MG tablet Place 2 tablets into feeding tube 3 (three) times daily. Take 2 tablet three times a day with meals (Patient taking differently: Place 2 tablets into feeding tube 3 (three) times daily. Take 2 tablet three times a day with meals at (8am, 12 pm, 4pm)) 540 tablet 3   Elastic Bandages & Supports (GAIT/TRANSFER BELT) MISC 1 Product by Does not apply route daily. 1 each 3   folic acid (FOLVITE) 1 MG tablet Take 1 mg by mouth daily.     furosemide (LASIX) 20 MG tablet Place 1 tablet (20 mg total)  into feeding tube daily as needed for fluid or edema (to maintain dry weight.). 30 tablet 11   Incontinence Supplies (EXTENSION TUBING/CONNECTOR) MISC Use with PEG feedings change every 5 days Mic-key extension 6 each 11   levETIRAcetam (KEPPRA) 100 MG/ML solution Give 74mL (500mg ) in AM, 48mL (1000mg ) in PM 473 mL 11   levothyroxine (SYNTHROID) 150 MCG tablet Place 150 mcg into feeding tube daily before breakfast.     NONFORMULARY OR COMPOUNDED ITEM Antifungal solution: Terbinafine 3%, Fluconazole 2%, Tea Tree Oil 5%, Urea 10%, Ibuprofen 2% in DMSO suspension #66mL 1 each 3   Nutritional Supplements (OSMOLITE) LIQD 1 Can by PEG Tube route 3 (three) times  daily. Please dispense 90 cartons per 30 days for administration per PEG three times daily 7200 mL 12   OXYGEN Inhale 3 L into the lungs as directed. With CPAP      phenytoin (DILANTIN) 125 MG/5ML suspension Give 82mL (200mg ) twice a day by PEG tube 480 mL 11   Sennosides (SENNA) 8.8 MG/5ML SYRP TAKE 10 MLS BY MOUTH DAILY AS NEEDED 237 mL 1   simvastatin (ZOCOR) 40 MG tablet TAKE 40 MG BY MOUTH DAILY AT BEDTIME (Patient taking differently: Take 40 mg by mouth at bedtime. ) 90 tablet 3   sodium chloride (MURO 128) 5 % ophthalmic ointment Place 1 application into both eyes at bedtime.     SYRINGE DISPOSABLE 60CC 60 ML MISC 1 syringe per feeding three times a day 90 each 12   vitamin B-12 (CYANOCOBALAMIN) 100 MCG tablet Place 100 mcg into feeding tube daily.      XARELTO 20 MG TABS tablet TAKE 1 TABLET (20 MG TOTAL) BY MOUTH DAILY WITH SUPPER. (Patient taking differently: Take 20 mg by mouth daily. ) 90 tablet 1   ipratropium (ATROVENT) 0.02 % nebulizer solution Take 2.5 mLs (0.5 mg total) by nebulization every 4 (four) hours as needed for wheezing or shortness of breath. 450 mL 0   No facility-administered medications prior to visit.    Review of Systems  Constitutional: Positive for malaise/fatigue. Negative for chills, diaphoresis, fever and weight loss.  HENT: Negative for congestion.   Respiratory: Positive for wheezing. Negative for cough, hemoptysis, sputum production and shortness of breath.   Cardiovascular: Negative for chest pain, palpitations and leg swelling.     Objective:   Vitals:   05/15/20 1136  BP: (!) 110/50  Pulse: (!) 50  Temp: 97.6 F (36.4 C)  TempSrc: Temporal  SpO2: 99%  Weight: 142 lb (64.4 kg)  Height: 4\' 10"  (1.473 m)   SpO2: 99 % O2 Device: None (Room air)  Physical Exam: General: Chronically ill-appearing female, Down syndrome features, no acute distress HENT: [place], AT Eyes: EOMI, no scleral icterus Respiratory: Diminished breath sounds.  Clear to auscultation bilaterally.  No crackles, wheezing or rales Cardiovascular: RRR, -M/R/G, no JVD Extremities:-Edema,-tenderness Neuro: AAO x4, CNII-XII grossly intact Skin: Intact, no rashes or bruising  Data Reviewed:  Imaging: CT Chest 09/21/19 - Bibasilar consolidation with air bronchograms  PFT: None on file  Sleep Studies: PSG 09/10/15 - AHI 12.8. CPAP Titration 03/03/17 - Recommend CPAP 14 cmH20    Assessment & Plan:   Discussion:  Asthma Exacerbation --START Prednisone taper as directed --START Pulmicort nebulizer TWICE a day --START Duonebs nebulizer TWICE a day   OSA on CPAP --Continue BiPAP nightly and when napping  Health Maintenance Immunization History  Administered Date(s) Administered   Influenza Inj Mdck Quad Pf 04/25/2019   Influenza Split 05/03/2016,  04/16/2017   Influenza, Quadrivalent, Recombinant, Inj, Pf 04/25/2019   Influenza, Seasonal, Injecte, Preservative Fre 05/03/2016, 04/16/2017, 05/31/2017   Influenza,inj,Quad PF,6+ Mos 04/22/2017, 05/02/2018   Influenza-Unspecified 05/03/2016, 05/03/2019   Janssen (J&J) SARS-COV-2 Vaccination 11/04/2019   Pneumococcal Polysaccharide-23 07/19/2017   Pneumococcal-Unspecified 07/19/2017   Tdap 11/01/2014   CT Lung Screen - not qualified  Orders Placed This Encounter  Procedures   Flu Vaccine QUAD 36+ mos IM   Meds ordered this encounter  Medications   DISCONTD: budesonide (PULMICORT) 0.5 MG/2ML nebulizer solution    Sig: Take 2 mLs (0.5 mg total) by nebulization in the morning and at bedtime.    Dispense:  120 mL    Refill:  0   DISCONTD: ipratropium-albuterol (DUONEB) 0.5-2.5 (3) MG/3ML SOLN    Sig: Take 3 mLs by nebulization every 6 (six) hours as needed.    Dispense:  360 mL    Refill:  12   predniSONE (DELTASONE) 10 MG tablet    Sig: Take 4 tablets (40 mg total) by mouth daily with breakfast for 3 days, THEN 3 tablets (30 mg total) daily with breakfast for 3 days, THEN 2  tablets (20 mg total) daily with breakfast for 3 days, THEN 1 tablet (10 mg total) daily with breakfast for 3 days.    Dispense:  20 tablet    Refill:  0    Return in about 4 months (around 09/15/2020).  I have spent a total time of 35-minutes on the day of the appointment reviewing prior documentation, coordinating care and discussing medical diagnosis and plan with the patient/family. Imaging, labs and tests included in this note have been reviewed and interpreted independently by me.  Theodosia Bahena Mechele Collin, MD [facility] 05/15/2020 11:49 AM  Office Number [phone]

## 2020-05-15 NOTE — Telephone Encounter (Signed)
Called [facility] and spoke with Ron to let them know that prescriptions were sent in with codes on them. Nothing further needed at this time.

## 2020-05-16 ENCOUNTER — Telehealth: Payer: Self-pay | Admitting: Pulmonary Disease

## 2020-05-16 NOTE — Telephone Encounter (Signed)
ATC [name], line went to a fast busy signal after 4 rings. Will try back.

## 2020-05-17 ENCOUNTER — Telehealth: Payer: Self-pay | Admitting: Pulmonary Disease

## 2020-05-20 NOTE — Telephone Encounter (Signed)
Spoke with Kaitlyn Powell  She states needing PA done for budesonide and duoneb  [facility]- they tried running under Part B insurance and rx for Duoneb went through as well as rx for Budesonide    Left detailed msg on machine for Kaitlyn Powell letting her know that this was done  Nothing further needed

## 2020-05-21 ENCOUNTER — Ambulatory Visit: Payer: Medicare Other | Admitting: Podiatry

## 2020-05-21 ENCOUNTER — Telehealth: Payer: Self-pay | Admitting: Pulmonary Disease

## 2020-05-21 MED ORDER — PREDNISONE 10 MG PO TABS
ORAL_TABLET | ORAL | 0 refills | Status: DC
Start: 2020-05-21 — End: 2020-12-13

## 2020-05-21 NOTE — Telephone Encounter (Signed)
Spoke with pt's POA, [name]. States that a prednisone taper was sent in for the pt back on 05/15/20. The quantity was short #10 tablets. I have sent in a prescription for the remaining #10 tablets they need. Nothing further was needed.

## 2020-05-23 NOTE — Telephone Encounter (Signed)
Lmtcb for [name] 

## 2020-05-23 NOTE — Telephone Encounter (Signed)
Spoke with sister [name] who states that they were able to Pulmicort. She states that all meds have been taken care of. Nothing further needed at this time.

## 2020-05-29 ENCOUNTER — Telehealth: Payer: Self-pay | Admitting: Pulmonary Disease

## 2020-05-29 NOTE — Telephone Encounter (Signed)
Spoke with pt who states [facility] stated ordered place in May 2021 was for CPAP not BiPAP. Upon review of order it appears that order states B-PAP. Forwarding to PCCs so they may advise. Thank you

## 2020-05-29 NOTE — Telephone Encounter (Signed)
[name] returning a phone call [name] can be reached at [phone]

## 2020-05-29 NOTE — Telephone Encounter (Signed)
ATC LMTCB  

## 2020-05-30 DIAGNOSIS — J45901 Unspecified asthma with (acute) exacerbation: Secondary | ICD-10-CM | POA: Insufficient documentation

## 2020-05-30 DIAGNOSIS — J4550 Severe persistent asthma, uncomplicated: Secondary | ICD-10-CM | POA: Insufficient documentation

## 2020-05-30 NOTE — Telephone Encounter (Signed)
Message sent to [facility] 

## 2020-06-04 NOTE — Telephone Encounter (Signed)
Sent another message to [facility]

## 2020-06-05 NOTE — Telephone Encounter (Signed)
Spoke to [email or web address]  this is in processing and caregiver is aware [facility]os

## 2020-06-08 ENCOUNTER — Other Ambulatory Visit: Payer: Self-pay | Admitting: Internal Medicine

## 2020-06-10 NOTE — Telephone Encounter (Signed)
Pt last saw Dr Johney Frame 09/15/19 video visit Covid-19, last labs 05/09/20 Creat 1.13, age 64, weight 64.4kg, CrCl 51.13, based on CrCl pt is on appropriate dosage of Xarelto 20mg  QD.  Will refill rx.

## 2020-06-11 ENCOUNTER — Other Ambulatory Visit: Payer: Self-pay | Admitting: Family Medicine

## 2020-06-11 DIAGNOSIS — Z1231 Encounter for screening mammogram for malignant neoplasm of breast: Secondary | ICD-10-CM

## 2020-06-20 ENCOUNTER — Telehealth: Payer: Self-pay | Admitting: Pulmonary Disease

## 2020-06-20 DIAGNOSIS — G4733 Obstructive sleep apnea (adult) (pediatric): Secondary | ICD-10-CM

## 2020-06-20 NOTE — Telephone Encounter (Signed)
Called and spoke with pt's sister [name] who states they have been waiting for a BIPAP machine for pt. [name] said each time a new machine arrives for pt at the house, it is a CPAP not a BIPAP.  Looking at recent orders, I am not seeing where a recent order was sent to [facility] specifying BIPAP settings.  [name] said she has been speaking with Morrie Sheldon at [employer] [facility] and they are needing a prescription sent to them for BIPAP with settings 20/5.  Routing to Olivia for help. Arlys John, please advise.

## 2020-06-20 NOTE — Telephone Encounter (Signed)
Okay to place order.  I would recommend contacting [name] with [facility] to notify her of this that way they can also ensure that this gets completed correctly.  Also so they can follow-up on why there was a delay.Elisha Headland, FNP

## 2020-06-21 NOTE — Telephone Encounter (Signed)
Called and spoke with [name] from [facility], she is going to follow up on the confusion regarding the CPAP versus the BIPAP.  She asked to have [place] clarify if patient is wanting a dream station, are they aware of the recall?  Is the dream station registered online for the recall and why do they want a new unit when she got one in 2017?

## 2020-06-21 NOTE — Telephone Encounter (Signed)
Called and spoke with Efraim Kaufmann, she will work on the reimbursement for the CPAP machine that they do not have, but were charged for previously.  Order sent to [facility] for a new bipap with settings.  Nothing further needed.

## 2020-06-21 NOTE — Telephone Encounter (Signed)
Called and spoke with [name] with [facility], she is going to do some research regarding this patient and the bipap and she will call me back.

## 2020-06-25 ENCOUNTER — Other Ambulatory Visit: Payer: Self-pay

## 2020-06-25 ENCOUNTER — Telehealth: Payer: Self-pay | Admitting: Physician Assistant

## 2020-06-25 DIAGNOSIS — Z931 Gastrostomy status: Secondary | ICD-10-CM

## 2020-06-25 DIAGNOSIS — E440 Moderate protein-calorie malnutrition: Secondary | ICD-10-CM

## 2020-06-25 MED ORDER — OMEPRAZOLE 2 MG/ML ORAL SUSPENSION: 20.0000 mg | Freq: Every day | ORAL | 2 refills | Status: DC

## 2020-06-25 NOTE — Telephone Encounter (Signed)
Patient's POA [name] called requesting an appt for patient.  She states patient a PEG tube and when they clean it, it comes up with a light brownish colored mucus.  Before it was clear.  She wants to know if this is normal or what is going on.  Amy saw patient on 03/2020.  I scheduled to see Dr. Lavon Paganini on 08/14/2020 but not sure if appt is appropriate or if patient needs to be referred somewhere else.  Please advise.

## 2020-06-25 NOTE — Telephone Encounter (Signed)
What are your thoughts?

## 2020-06-25 NOTE — Telephone Encounter (Signed)
Kaitlyn Powell, it will be better for patient to have IR visit to have the tube replaced if it is getting discolored. It is not uncommon to have some regurgitation of gastric contents into the PEG tube, please advise family to flush after feeds and can also do intermittent flushing with water if needed.  Thank you

## 2020-06-25 NOTE — Telephone Encounter (Signed)
Spoke with [name]. The patient is experiencing a thick brownish mucous in her mouth. It reminds her of when patient was not well hydrated. However her fluid intake is monitored and it is unchanged. She coughs at times with this mucous. Her breathing is good. She has recently been on prednisone without improvement of her symptoms. PEG functioning well.  Per conversation with Dr Lavon Paganini, will try PPI Omeprazole 20 mg per tube once daily. Will ask for liquid if insurance will cooperate.  [facility] [address]  PA started for Omeprazole suspension through [employer] Med[employer].

## 2020-07-01 ENCOUNTER — Other Ambulatory Visit: Payer: Self-pay | Admitting: Gastroenterology

## 2020-07-01 ENCOUNTER — Other Ambulatory Visit: Payer: Self-pay

## 2020-07-01 MED ORDER — PRILOSEC 10 MG PO PACK: 20.0000 mg | PACK | Freq: Every day | ORAL | 3 refills | Status: DC

## 2020-07-01 NOTE — Telephone Encounter (Signed)
Liquid Omeprazole denied by insurance. Family aware. They purchased it paying out of pocket. Appointment moved to 07/03/20 at 1:30 pm. PA for Omeprazole Packets started through [employer] My Meds. KeyMerdis Delay - PA Case ID: [id number]

## 2020-07-03 ENCOUNTER — Encounter: Payer: Self-pay | Admitting: Gastroenterology

## 2020-07-03 ENCOUNTER — Ambulatory Visit (INDEPENDENT_AMBULATORY_CARE_PROVIDER_SITE_OTHER): Payer: Medicare Other | Admitting: Gastroenterology

## 2020-07-03 VITALS — BP 112/62 | HR 86 | Temp 98.6°F

## 2020-07-03 DIAGNOSIS — K5900 Constipation, unspecified: Secondary | ICD-10-CM | POA: Diagnosis not present

## 2020-07-03 DIAGNOSIS — K219 Gastro-esophageal reflux disease without esophagitis: Secondary | ICD-10-CM | POA: Diagnosis not present

## 2020-07-03 DIAGNOSIS — R059 Cough, unspecified: Secondary | ICD-10-CM

## 2020-07-03 NOTE — Patient Instructions (Signed)
AVOID feeds within four hours of bedtime  Take Miralax 1 capful through tube feeds daily as needed  Increase fluid intake to  2L per day  Follow up as needed   Gastroesophageal Reflux Disease, Adult Gastroesophageal reflux (GER) happens when acid from the stomach flows up into the tube that connects the mouth and the stomach (esophagus). Normally, food travels down the esophagus and stays in the stomach to be digested. However, when a person has GER, food and stomach acid sometimes move back up into the esophagus. If this becomes a more serious problem, the person may be diagnosed with a disease called gastroesophageal reflux disease (GERD). GERD occurs when the reflux:  Happens often.  Causes frequent or severe symptoms.  Causes problems such as damage to the esophagus. When stomach acid comes in contact with the esophagus, the acid may cause soreness (inflammation) in the esophagus. Over time, GERD may create small holes (ulcers) in the lining of the esophagus. What are the causes? This condition is caused by a problem with the muscle between the esophagus and the stomach (lower esophageal sphincter, or LES). Normally, the LES muscle closes after food passes through the esophagus to the stomach. When the LES is weakened or abnormal, it does not close properly, and that allows food and stomach acid to go back up into the esophagus. The LES can be weakened by certain dietary substances, medicines, and medical conditions, including:  Tobacco use.  Pregnancy.  Having a hiatal hernia.  Alcohol use.  Certain foods and beverages, such as coffee, chocolate, onions, and peppermint. What increases the risk? You are more likely to develop this condition if you:  Have an increased body weight.  Have a connective tissue disorder.  Use NSAID medicines. What are the signs or symptoms? Symptoms of this condition include:  Heartburn.  Difficult or painful swallowing.  The feeling of  having a lump in the throat.  Abitter taste in the mouth.  Bad breath.  Having a large amount of saliva.  Having an upset or bloated stomach.  Belching.  Chest pain. Different conditions can cause chest pain. Make sure you see your health care provider if you experience chest pain.  Shortness of breath or wheezing.  Ongoing (chronic) cough or a night-time cough.  Wearing away of tooth enamel.  Weight loss. How is this diagnosed? Your health care provider will take a medical history and perform a physical exam. To determine if you have mild or severe GERD, your health care provider may also monitor how you respond to treatment. You may also have tests, including:  A test to examine your stomach and esophagus with a small camera (endoscopy).  A test thatmeasures the acidity level in your esophagus.  A test thatmeasures how much pressure is on your esophagus.  A barium swallow or modified barium swallow test to show the shape, size, and functioning of your esophagus. How is this treated? The goal of treatment is to help relieve your symptoms and to prevent complications. Treatment for this condition may vary depending on how severe your symptoms are. Your health care provider may recommend:  Changes to your diet.  Medicine.  Surgery. Follow these instructions at home: Eating and drinking   Follow a diet as recommended by your health care provider. This may involve avoiding foods and drinks such as: ? Coffee and tea (with or without caffeine). ? Drinks that containalcohol. ? Energy drinks and sports drinks. ? Carbonated drinks or sodas. ? Chocolate and cocoa. ?  Peppermint and mint flavorings. ? Garlic and onions. ? Horseradish. ? Spicy and acidic foods, including peppers, chili powder, curry powder, vinegar, hot sauces, and barbecue sauce. ? Citrus fruit juices and citrus fruits, such as oranges, lemons, and limes. ? Tomato-based foods, such as red sauce, chili,  salsa, and pizza with red sauce. ? Fried and fatty foods, such as donuts, french fries, potato chips, and high-fat dressings. ? High-fat meats, such as hot dogs and fatty cuts of red and white meats, such as rib eye steak, sausage, ham, and bacon. ? High-fat dairy items, such as whole milk, butter, and cream cheese.  Eat small, frequent meals instead of large meals.  Avoid drinking large amounts of liquid with your meals.  Avoid eating meals during the 2-3 hours before bedtime.  Avoid lying down right after you eat.  Do not exercise right after you eat. Lifestyle   Do not use any products that contain nicotine or tobacco, such as cigarettes, e-cigarettes, and chewing tobacco. If you need help quitting, ask your health care provider.  Try to reduce your stress by using methods such as yoga or meditation. If you need help reducing stress, ask your health care provider.  If you are overweight, reduce your weight to an amount that is healthy for you. Ask your health care provider for guidance about a safe weight loss goal. General instructions  Pay attention to any changes in your symptoms.  Take over-the-counter and prescription medicines only as told by your health care provider. Do not take aspirin, ibuprofen, or other NSAIDs unless your health care provider told you to do so.  Wear loose-fitting clothing. Do not wear anything tight around your waist that causes pressure on your abdomen.  Raise (elevate) the head of your bed about 6 inches (15 cm).  Avoid bending over if this makes your symptoms worse.  Keep all follow-up visits as told by your health care provider. This is important. Contact a health care provider if:  You have: ? New symptoms. ? Unexplained weight loss. ? Difficulty swallowing or it hurts to swallow. ? Wheezing or a persistent cough. ? A hoarse voice.  Your symptoms do not improve with treatment. Get help right away if you:  Have pain in your arms,  neck, jaw, teeth, or back.  Feel sweaty, dizzy, or light-headed.  Have chest pain or shortness of breath.  Vomit and your vomit looks like blood or coffee grounds.  Faint.  Have stool that is bloody or black.  Cannot swallow, drink, or eat. Summary  Gastroesophageal reflux happens when acid from the stomach flows up into the esophagus. GERD is a disease in which the reflux happens often, causes frequent or severe symptoms, or causes problems such as damage to the esophagus.  Treatment for this condition may vary depending on how severe your symptoms are. Your health care provider may recommend diet and lifestyle changes, medicine, or surgery.  Contact a health care provider if you have new or worsening symptoms.  Take over-the-counter and prescription medicines only as told by your health care provider. Do not take aspirin, ibuprofen, or other NSAIDs unless your health care provider told you to do so.  Keep all follow-up visits as told by your health care provider. This is important. This information is not intended to replace advice given to you by your health care provider. Make sure you discuss any questions you have with your health care provider. Document Revised: 01/26/2018 Document Reviewed: 01/26/2018 Elsevier Patient Education  2020 [employer] Inc[employer]   Food Choices for Gastroesophageal Reflux Disease, Adult When you have gastroesophageal reflux disease (GERD), the foods you eat and your eating habits are very important. Choosing the right foods can help ease your discomfort. Think about working with a nutrition specialist (dietitian) to help you make good choices. What are tips for following this plan?  Meals  Choose healthy foods that are low in fat, such as fruits, vegetables, whole grains, low-fat dairy products, and lean meat, fish, and poultry.  Eat small meals often instead of 3 large meals a day. Eat your meals slowly, and in a place where you are relaxed. Avoid  bending over or lying down until 2-3 hours after eating.  Avoid eating meals 2-3 hours before bed.  Avoid drinking a lot of liquid with meals.  Cook foods using methods other than frying. Bake, grill, or broil food instead.  Avoid or limit: ? Chocolate. ? Peppermint or spearmint. ? Alcohol. ? Pepper. ? Black and decaffeinated coffee. ? Black and decaffeinated tea. ? Bubbly (carbonated) soft drinks. ? Caffeinated energy drinks and soft drinks.  Limit high-fat foods such as: ? Fatty meat or fried foods. ? Whole milk, cream, butter, or ice cream. ? Nuts and nut butters. ? Pastries, donuts, and sweets made with butter or shortening.  Avoid foods that cause symptoms. These foods may be different for everyone. Common foods that cause symptoms include: ? Tomatoes. ? Oranges, lemons, and limes. ? Peppers. ? Spicy food. ? Onions and garlic. ? Vinegar. Lifestyle  Maintain a healthy weight. Ask your doctor what weight is healthy for you. If you need to lose weight, work with your doctor to do so safely.  Exercise for at least 30 minutes for 5 or more days each week, or as told by your doctor.  Wear loose-fitting clothes.  Do not smoke. If you need help quitting, ask your doctor.  Sleep with the head of your bed higher than your feet. Use a wedge under the mattress or blocks under the bed frame to raise the head of the bed. Summary  When you have gastroesophageal reflux disease (GERD), food and lifestyle choices are very important in easing your symptoms.  Eat small meals often instead of 3 large meals a day. Eat your meals slowly, and in a place where you are relaxed.  Limit high-fat foods such as fatty meat or fried foods.  Avoid bending over or lying down until 2-3 hours after eating.  Avoid peppermint and spearmint, caffeine, alcohol, and chocolate. This information is not intended to replace advice given to you by your health care provider. Make sure you discuss any  questions you have with your health care provider. Document Revised: 11/10/2018 Document Reviewed: 08/25/2016 Elsevier Patient Education  2020 [employer].  I appreciate the  opportunity to care for you  Thank You   Marsa Aris , MD

## 2020-07-03 NOTE — Progress Notes (Signed)
Kaitlyn Powell    [id number]    [birth year]  Primary Care Physician:Bouska, Onalee Hua, MD  Referring Physician: Tracey Harries, MD [address] [place],  [place] [address]   Chief complaint: Reflux  HPI: 64 year old very pleasant female with Down syndrome, Parkinson's disease, OSA on BiPAP and Alzheimer's dementia here for follow up visit for coughing and reflux symptoms  Kaitlyn Powell was somnolent and non verbal  History provided by her sister and brother-in-law  She is having thick secretions and coughing, mostly worse in the morning.  She was recently seen by Dr. Everardo All in pulmonary clinic.  Started on prednisone taper, duo nebs and Pulmicort.  Her mental status is progressively worsening. She was treated with antibiotics by PMD for UTI  She had virtual visit with Dr. Everlene Other, labs ordered.  Kaitlyn Powell is nonverbal, has not been complaining of any pain other than occasional back pain. She is tired and more somnolent recently.  No melena or rectal bleeding.  She is having alternating constipation and diarrhea.  She gets thickened fluids, pured diet supplemented by tube feeds  Outpatient Encounter Medications as of 07/03/2020  Medication Sig  . albuterol (PROVENTIL) (2.5 MG/3ML) 0.083% nebulizer solution Take 2.5 mg by nebulization every 6 (six) hours as needed for wheezing or shortness of breath.  . budesonide (PULMICORT) 0.5 MG/2ML nebulizer solution Take 2 mLs (0.5 mg total) by nebulization in the morning and at bedtime.  . cannabidiol (EPIDIOLEX) 100 MG/ML solution Give 3.7 mL twice a day  . carbidopa-levodopa (SINEMET IR) 25-100 MG tablet Place 2 tablets into feeding tube 3 (three) times daily. Take 2 tablet three times a day with meals (Patient taking differently: Place 2 tablets into feeding tube 3 (three) times daily. Take 2 tablet three times a day with meals at (8am, 12 pm, 4pm))  . Elastic Bandages & Supports (GAIT/TRANSFER BELT) MISC 1 Product by Does  not apply route daily.  Marland Kitchen esomeprazole (NEXIUM) 10 MG packet Take 10 mg by mouth daily before breakfast.  . folic acid (FOLVITE) 1 MG tablet Take 1 mg by mouth daily.  . furosemide (LASIX) 20 MG tablet Place 1 tablet (20 mg total) into feeding tube daily as needed for fluid or edema (to maintain dry weight.).  . Incontinence Supplies (EXTENSION TUBING/CONNECTOR) MISC Use with PEG feedings change every 5 days Mic-key extension  . ipratropium-albuterol (DUONEB) 0.5-2.5 (3) MG/3ML SOLN Take 3 mLs by nebulization every 6 (six) hours as needed.  . levETIRAcetam (KEPPRA) 100 MG/ML solution Give 49mL (500mg ) in AM, 63mL (1000mg ) in PM  . levothyroxine (SYNTHROID) 150 MCG tablet Place 150 mcg into feeding tube daily before breakfast.  . NONFORMULARY OR COMPOUNDED ITEM Antifungal solution: Terbinafine 3%, Fluconazole 2%, Tea Tree Oil 5%, Urea 10%, Ibuprofen 2% in DMSO suspension #56mL  . OXYGEN Inhale 3 L into the lungs as directed. With CPAP   . phenytoin (DILANTIN) 125 MG/5ML suspension Give 46mL (200mg ) twice a day by PEG tube  . predniSONE (DELTASONE) 10 MG tablet Take as directed per Dr. 31m  . Sennosides (SENNA) 8.8 MG/5ML SYRP TAKE 10 MLS BY MOUTH DAILY AS NEEDED  . simvastatin (ZOCOR) 40 MG tablet TAKE 40 MG BY MOUTH DAILY AT BEDTIME (Patient taking differently: Take 40 mg by mouth at bedtime. )  . sodium chloride (MURO 128) 5 % ophthalmic ointment Place 1 application into both eyes at bedtime.  . SYRINGE DISPOSABLE 60CC 60 ML MISC 1 syringe per feeding  three times a day  . vitamin B-12 (CYANOCOBALAMIN) 100 MCG tablet Place 100 mcg into feeding tube daily.   Kaitlyn Powell 20 MG TABS tablet TAKE 1 TABLET (20 MG TOTAL) BY MOUTH DAILY WITH SUPPER.  . [DISCONTINUED] Omeprazole Magnesium (PRILOSEC) 10 MG PACK 20 mg by PEG Tube route daily before breakfast.  . ipratropium (ATROVENT) 0.02 % nebulizer solution Take 2.5 mLs (0.5 mg total) by nebulization every 4 (four) hours as needed for wheezing or  shortness of breath.   No facility-administered encounter medications on file as of 07/03/2020.    Allergies as of 07/03/2020 - Review Complete 07/03/2020  Allergen Reaction Noted  . Cephalexin Other (See Comments) 06/04/2016  . Namenda [memantine hcl] Other (See Comments) 06/21/2018    Past Medical History:  Diagnosis Date  . Arthritis   . Aspiration pneumonia (HCC)   . Asthma   . Cardiac arrhythmia    have pacemeaker  . Down syndrome   . DVT of popliteal vein (HCC) 09/08/2018   chronic  . GERD (gastroesophageal reflux disease)   . Heart block   . Hypothyroidism   . Mental retardation   . Pacemaker   . Parkinson's disease (HCC) 05/2019  . Pulmonary emboli (HCC)   . Seizures (HCC)   . Sleep apnea with use of continuous positive airway pressure (CPAP)     Past Surgical History:  Procedure Laterality Date  . IR GASTROSTOMY TUBE MOD SED  10/04/2019  . IR RADIOLOGIST EVAL & MGMT  02/07/2020  . IR RADIOLOGIST EVAL & MGMT  02/28/2020  . IR REPLC GASTRO/COLONIC TUBE PERCUT W/FLUORO  12/14/2019  . IR REPLC GASTRO/COLONIC TUBE PERCUT W/FLUORO  01/04/2020  . IR REPLC GASTRO/COLONIC TUBE PERCUT W/FLUORO  03/19/2020  . PACEMAKER INSERTION  12/09/2015   [employer] Accolade MRI EL PPM implanted in PA for complete heart block and syncope  . PEG TUBE PLACEMENT     removed in 2017  . TIBIA FRACTURE SURGERY Right     Family History  Problem Relation Age of Onset  . Diabetes Father   . Heart disease Father   . Breast cancer Sister     Social History   Socioeconomic History  . Marital status: Single    Spouse name: Not on file  . Number of children: Not on file  . Years of education: Not on file  . Highest education level: Not on file  Occupational History  . Occupation: disabled  Tobacco Use  . Smoking status: Never Smoker  . Smokeless tobacco: Never Used  Vaping Use  . Vaping Use: Never used  Substance and Sexual Activity  . Alcohol use: No  . Drug use: No  . Sexual  activity: Never  Other Topics Concern  . Not on file  Social History Narrative   Pt is left handed   Lives in 2 story home with her sister, brother-in-law and BIL's brother (pt stays on bottom floor)   Social Determinants of Health   Financial Resource Strain:   . Difficulty of Paying Living Expenses: Not on file  Food Insecurity:   . Worried About Programme researcher, broadcasting/film/video in the Last Year: Not on file  . Ran Out of Food in the Last Year: Not on file  Transportation Needs:   . Lack of Transportation (Medical): Not on file  . Lack of Transportation (Non-Medical): Not on file  Physical Activity:   . Days of Exercise per Week: Not on file  . Minutes of Exercise per Session:  Not on file  Stress:   . Feeling of Stress : Not on file  Social Connections:   . Frequency of Communication with Friends and Family: Not on file  . Frequency of Social Gatherings with Friends and Family: Not on file  . Attends Religious Services: Not on file  . Active Member of Clubs or Organizations: Not on file  . Attends Banker Meetings: Not on file  . Marital Status: Not on file  Intimate Partner Violence:   . Fear of Current or Ex-Partner: Not on file  . Emotionally Abused: Not on file  . Physically Abused: Not on file  . Sexually Abused: Not on file      Review of systems: All other review of systems negative except as mentioned in the HPI.   Physical Exam: Vitals:   07/03/20 1327  BP: 112/62  Pulse: 86  Temp: 98.6 F (37 C)  SpO2: 99%   There is no height or weight on file to calculate BMI. Gen:      No acute distress HEENT:  sclera anicteric Abd:      soft, non-tender; no palpable masses, no distension.  PEG tube in place Ext:    No edema Neuro: alert and oriented x 3 Psych: normal mood and affect  Data Reviewed:  Reviewed labs, radiology imaging, old records and pertinent past GI work up   Assessment and Plan/Recommendations:  64 year old female with Down syndrome,  epilepsy, OSA, Parkinson's disease, and Alzheimer's dementia  Cough with thick oropharyngeal secretions: Possibly secondary to GERD but cannot exclude bronchitis, aspiration or inspissated secretions due to dehydration Advised maintaining aggressive oral hygiene with suction as needed Increase dietary fluid intake to prevent dehydration Discussed antireflux measures Avoid tube feeds or oral feed within 4 hours of bedtime Continue Nexium daily  Constipation with alternating diarrhea: Start MiraLAX 1 capful daily Add Benefiber 1 tablespoon 2-3 times daily with meals as needed  Dementia with declining mental status: Managed by PMD Will need to exclude acute infection, has labs ordered by PMD   This visit required 45 minutes of patient care (this includes precharting, chart review, review of results, face-to-face time used for counseling as well as treatment plan and follow-up. The patient was provided an opportunity to ask questions and all were answered. The patient agreed with the plan and demonstrated an understanding of the instructions.  [name] , MD    CC: Tracey Harries, MD

## 2020-07-04 ENCOUNTER — Telehealth: Payer: Self-pay | Admitting: *Deleted

## 2020-07-04 NOTE — Telephone Encounter (Signed)
Nexium 10 mg packets was approved under [facility] prescription drug plan  End date for this med is  Until further notice  Start date is 07/01/2020   Will send approval letter to be scanned in

## 2020-07-10 ENCOUNTER — Telehealth: Payer: Self-pay | Admitting: Neurology

## 2020-07-10 NOTE — Telephone Encounter (Signed)
No answer at [phone]

## 2020-07-10 NOTE — Telephone Encounter (Signed)
Patient's sister returned call to [place].

## 2020-07-10 NOTE — Telephone Encounter (Signed)
Line busy at 07/10/2020 at 11:23am

## 2020-07-10 NOTE — Telephone Encounter (Signed)
Patient's sister called in wanting a sooner appointment. I added her to the wait list. She said the patient is very tired and sleeping a lot more, she has been having a lot of extra saliva pooling in her mouth, and her knees are buckling. She seems like a "ragdoll". They are very concerned. I let her know Dr. Karel Jarvis is out until next week.

## 2020-07-11 NOTE — Telephone Encounter (Signed)
Please see note attached, an advise on Monday.

## 2020-07-11 NOTE — Telephone Encounter (Signed)
No answer at [phone]

## 2020-07-15 NOTE — Telephone Encounter (Signed)
MyChart message sent to sister [name].

## 2020-07-18 ENCOUNTER — Encounter: Payer: Self-pay | Admitting: Family Medicine

## 2020-07-19 ENCOUNTER — Other Ambulatory Visit: Payer: Self-pay | Admitting: Family Medicine

## 2020-07-19 ENCOUNTER — Other Ambulatory Visit: Payer: Self-pay

## 2020-07-19 ENCOUNTER — Telehealth (INDEPENDENT_AMBULATORY_CARE_PROVIDER_SITE_OTHER): Payer: Medicare Other | Admitting: Neurology

## 2020-07-19 ENCOUNTER — Encounter: Payer: Self-pay | Admitting: Neurology

## 2020-07-19 VITALS — Ht <= 58 in | Wt 155.0 lb

## 2020-07-19 DIAGNOSIS — R4182 Altered mental status, unspecified: Secondary | ICD-10-CM

## 2020-07-19 DIAGNOSIS — Q909 Down syndrome, unspecified: Secondary | ICD-10-CM | POA: Diagnosis not present

## 2020-07-19 DIAGNOSIS — Z79899 Other long term (current) drug therapy: Secondary | ICD-10-CM

## 2020-07-19 DIAGNOSIS — G40211 Localization-related (focal) (partial) symptomatic epilepsy and epileptic syndromes with complex partial seizures, intractable, with status epilepticus: Secondary | ICD-10-CM | POA: Diagnosis not present

## 2020-07-19 DIAGNOSIS — F0280 Dementia in other diseases classified elsewhere without behavioral disturbance: Secondary | ICD-10-CM | POA: Diagnosis not present

## 2020-07-19 MED ORDER — DICLOFENAC SODIUM 1 % EX GEL: 2.0000 g | Freq: Four times a day (QID) | CUTANEOUS | 6 refills | Status: AC

## 2020-07-19 NOTE — Progress Notes (Signed)
Virtual Visit via Video Note The purpose of this virtual visit is to provide medical care while limiting exposure to the novel coronavirus.    Consent was obtained for video visit:  Yes.   Answered questions that patient had about telehealth interaction:  Yes.   I discussed the limitations, risks, security and privacy concerns of performing an evaluation and management service by telemedicine. I also discussed with the patient that there may be a patient responsible charge related to this service. The patient expressed understanding and agreed to proceed.  Pt location: Home Physician Location: office Name of referring provider:  Tracey Harries, MD I connected with Kaitlyn Powell at patients guardians initiation/request on 07/19/2020 at 11:30 AM EST by video enabled telemedicine application and verified that I am speaking with the correct person using two identifiers. Pt MRN:  [id number] Pt DOB:  1956/07/14 Video Participants:  Kaitlyn Powell;  Kaitlyn Powell [facility]   History of Present Illness:  The patient had a virtual video visit on 07/19/2020. She was last seen in the neurology clinic 5 months ago for intractable epilepsy and dementia due to Down syndrome. She is again accompanied by her sister Kaitlyn Powell and brother-in-law Kaitlyn Powell who provide the history. Kaitlyn has been lethargic for the past month. She had a UTI at the beginning of November and was treated with antibiotics twice, most recently they did a UA via catheterization and antibiotics were not restarted (culture appears to be pending). She had a Dilantin level done at 3pm on 07/03/20 which was supratherapeutic at 31.1 (Dilantin dose was given at 730am).  She was not on antibiotics at that time. Her TSH was also elevated at 10.200.Thankfully she has not had any seizures. She is on Epidiolex 3.21mL BID, Levetiracetam 500mg  in AM, 1000mg  in PM, and Dilantin 200mg  BID. She is almost non-verbal, but there are very brief rare periods of  lucidity where she would talk and "was again." She needs 2-person assist for transfers, they have noticed that her knees buckle/bounce like she would fall sometimes. She may lightly groan when standing/walking. They have tried muscle wraps which seem to help. She appears stiffer on her left side, a lot of times her shoulders are hunched/rounded and her hands are stiff. Her left knee is stiff and bent more when walking.  She is on Sinemet 2 tabs TID with meals.  When she is alert, they feed her pureed food, otherwise she has a G-tube. She has had thick secretions, Guaifenesin seems to help. They have to swab her mouth every morning, she drools sometimes (not daily). Her extremities are cooler some days more than others.   History on Initial Assessment 09/02/2018: This is a pleasant 64 year old left-handed woman with a history of Down syndrome, sleep apnea, hypothyroidism, sick sinus syndrome s/p PPM, presenting for second opinion regarding seizures. Kaitlyn Powell reports that Kaitlyn Powell's seizures started in her teens/early 41s, they would be walking and all of a sudden she would drop down. She would be awake and appear to lose all muscle tone, family would pick her up and she would be fine. She had been taking Dilantin for many years. 68 reports a period of time where she was not having seizures, or at least they were not hearing any news from her group home. In 2017, she became quite ill and was treated in 38s for pneumonia, CHF, and pulmonary embolism. She was on Lovenox until November 2017. There was concern for macrocytic anemia with normal B12 level  possibly due to prolonged Dilantin use, she had been on Dilantin 200mg  BID. She had an infected PEG tube and had a GTC lasting 20 minutes. She was seen by neurologist Dr. Terrace ArabiaYan in 2018 due to drop seizures occurring on a daily basis with loss of consciousness lasting less than a minute. She was started on Levetiracetam, however with attempt to wean down  Dilantin, she had several GTCs. She was restarted on Dilantin 200mg  BID and Keppra dose was reduced. She had an MRI brain without contrast in July 2018 which showed congenital microcephaly, moderate diffuse volum loss, moderate chronic microvascular disease. Routine EEG reported moderate to severe background slowing with rare generalized spike slow waves. The daily episodes decreased in frequency and would only occur when she was on the toilet. Initially they were occurring when she was having a BM and family could visibly see her abdomen straining, however recently they have occurred while peeing and without any visible straining. She would be out for 10-15 seconds, then wake up looking like she is trying to grab something or moving her hands L>R like she is scratching her leg. She would be talking gibberish or incoherent like baby talk when she starts coming around. A few times she would black out and go backward like someone pushed her back hard. She had a 3-day ambulatory video EEG study in November 2018. Background was diffusely slow with sharp and spike and wave discharges in the left frontotemporal region, bihemispheric, bisynchronous spike and wave discharges, isolated bursts of generalized spike and wave discharges, independent discharges in the right frontotemporal region. In general, there was a bifrontal predominance of discharges including the presence of occasional 2-2.5 slow spike and wave discharges. There was decreased amplitude over the right temporal area. There were 6 push button events, all due to "drop seizure on toilet." With 5 events, there was note of diffuse slow background with several bursts of bilateral rhythmic delta activity/intermittent delta activity. It was noted that event #6 had generalized frontally predominant 2 Hz spike and wave activity lasting 17 seconds with some after coming slow waves. Keppra dose was increased to 2500mg  daily at one point, then dose was reduced to 1000mg   qhs. She was tried on Lamotrigine but could not tolerate 25mg  dose, "looked like she was institutionalized." On 08/01/18, Kaitlyn DandyMary could not wake her up in the morning, she would open her eyes but was unresponsive. Kaitlyn sat her up and she started jerking for 10 minutes. They tried to stand her up after but they were dragging her like she was drunk and could not move her legs. She started waking up and family gave her medications, went back to sleep, then again had difficulty waking her up followed by upper body jerking when she was sat up. She was found to have a pneumonia. Keppra dose increased to 1000mg  BID in addition to Dilantin 200mg  BID. Family reports some days she would have 3 in one day, other times she would go 48 hours without an event. Last episode was yesterday. She is also on Risperdal 1mg  BID, family is unsure when or why this was prescribed, she is always happy and always positive.   Update 02/13/2019: Vimpat has been quite sedating to her, she is only on 25mg  qhs, higher doses caused significant drowsiness. She had a 72-hour ambulatory video EEG study from June 30-February 03, 2019 which I personally reviewed, baseline EEG was abnormal with frequent generalized spikes, independent epileptiform discharges over the left and right temporal regions. There  were 3 push button events, all in the bathroom, with 2 of them she is sitting on the commode then suddenly jerks back and appears unresponsive for a few seconds. There were no clear epileptiform discharges during these episodes but there was Powell voltage fast activity/muscle, different from her baseline. No EKG changes. The third episode was lethargy/decreased responsiveness with no body jerk. Her sister has also noticed that she is having more cognitive issues, having a harder time finding her words. She has noticed that she would hold her hand in a pincer position more often. She is currently drowsy after her meal, which is typical for her.   Prior AEDs:  Lamotrigine, Vimpat, Topamax   Diagnostic Data:  MRI brain with and without contrast done 05/2019 showed severe brain atrophy, especially in the temporal lobes, confluent FLAIR hyperintensity in the deep cerebral white matter, no acute changes.  Ambulatory EEG in 01/2019: This 65-hour ambulatory EEG study is abnormal due to the presence of: 1. Generalized background slowing 2. Frequent generalized 2-2.5 Hz slow spike and wave discharges 3. Multifocal epileptiform discharges over the bifrontal/frontopolar, left > right temporal regions 4. There were 3 episodes captured, 2 with typical drop attacks with associated diffuse background suppression      Current Outpatient Medications on File Prior to Visit  Medication Sig Dispense Refill  . albuterol (PROVENTIL) (2.5 MG/3ML) 0.083% nebulizer solution Take 2.5 mg by nebulization every 6 (six) hours as needed for wheezing or shortness of breath.    . budesonide (PULMICORT) 0.5 MG/2ML nebulizer solution Take 2 mLs (0.5 mg total) by nebulization in the morning and at bedtime. 120 mL 0  . cannabidiol (EPIDIOLEX) 100 MG/ML solution Give 3.7 mL twice a day 222 mL 5  . carbidopa-levodopa (SINEMET IR) 25-100 MG tablet Place 2 tablets into feeding tube 3 (three) times daily. Take 2 tablet three times a day with meals (Patient taking differently: Place 2 tablets into feeding tube 3 (three) times daily. Take 2 tablet three times a day with meals at (8am, 12 pm, 4pm)) 540 tablet 3  . Elastic Bandages & Supports (GAIT/TRANSFER BELT) MISC 1 Product by Does not apply route daily. 1 each 3  . esomeprazole (NEXIUM) 10 MG packet Take 10 mg by mouth daily before breakfast. 30 each 2  . folic acid (FOLVITE) 1 MG tablet Take 1 mg by mouth daily.    . furosemide (LASIX) 20 MG tablet Place 1 tablet (20 mg total) into feeding tube daily as needed for fluid or edema (to maintain dry weight.). 30 tablet 11  . Incontinence Supplies (EXTENSION TUBING/CONNECTOR) MISC Use with PEG  feedings change every 5 days Mic-key extension 6 each 11  . ipratropium-albuterol (DUONEB) 0.5-2.5 (3) MG/3ML SOLN Take 3 mLs by nebulization every 6 (six) hours as needed. 360 mL 12  . levETIRAcetam (KEPPRA) 100 MG/ML solution Give 32mL (500mg ) in AM, 23mL (1000mg ) in PM 473 mL 11  . levothyroxine (SYNTHROID) 150 MCG tablet Place 150 mcg into feeding tube daily before breakfast.    . NONFORMULARY OR COMPOUNDED ITEM Antifungal solution: Terbinafine 3%, Fluconazole 2%, Tea Tree Oil 5%, Urea 10%, Ibuprofen 2% in DMSO suspension #85mL 1 each 3  . OXYGEN Inhale 3 L into the lungs as directed. With CPAP    . phenytoin (DILANTIN) 125 MG/5ML suspension Give 28mL (200mg ) twice a day by PEG tube 480 mL 11  . Sennosides (SENNA) 8.8 MG/5ML SYRP TAKE 10 MLS BY MOUTH DAILY AS NEEDED 237 mL 1  . simvastatin (  ZOCOR) 40 MG tablet TAKE 40 MG BY MOUTH DAILY AT BEDTIME (Patient taking differently: Take 40 mg by mouth at bedtime.) 90 tablet 3  . sodium chloride (MURO 128) 5 % ophthalmic ointment Place 1 application into both eyes at bedtime.    . SYRINGE DISPOSABLE 60CC 60 ML MISC 1 syringe per feeding three times a day 90 each 12  . vitamin B-12 (CYANOCOBALAMIN) 100 MCG tablet Place 100 mcg into feeding tube daily.     [name] 20 MG TABS tablet TAKE 1 TABLET (20 MG TOTAL) BY MOUTH DAILY WITH SUPPER. 90 tablet 1  . ipratropium (ATROVENT) 0.02 % nebulizer solution Take 2.5 mLs (0.5 mg total) by nebulization every 4 (four) hours as needed for wheezing or shortness of breath. 450 mL 0  . predniSONE (DELTASONE) 10 MG tablet Take as directed per Dr. Everardo All (Patient not taking: Reported on 07/19/2020) 10 tablet 0  . [DISCONTINUED] Omeprazole Magnesium (PRILOSEC) 10 MG PACK 20 mg by PEG Tube route daily before breakfast. 60 each 3   No current facility-administered medications on file prior to visit.     Observations/Objective:   Vitals:   07/19/20 1105  Weight: 155 lb (70.3 kg)  Height: 4\' 10"  (1.473 m)   GEN:   The patient appears older than stated age, she is sitting on the chair with eyes closed, appears asleep with mouth open. Unable to do exam.    Assessment and Plan:   This is a pleasant 64 yo LH woman with a history of Down syndrome, sleep apnea, hypothyroidism, sick sinus syndrome s/p PPM, and seizures. Her 72-hour EEG captured episodes concerning for drop attacks with diffuse Powell voltage fast activity on EEG. She has had an excellent response to Epidiolex and has not had any seizures since January 2021. She has been lethargic despite treatment for UTI. Discussed symptoms likely due to metabolic encephalopathy, she had a supratherapeutic Dilantin level however this was not a trough level. Family instructed to reduce dose to 100mg  in AM, 200mg  in PM. Continue Levetiracetam 500mg  in AM, 1000mg  in PM and Epidiolex 3.25mL BID. Her TSH was also significantly elevated, continue management with PCP. They will try Tylenol for possible arthritis/discomfort when walking. Continue Sinemet 2 tabs TID, family feels it helped with mobility. Continue 24/7 care. Follow-up as scheduled in February 2022, they know to call for any changes.    Follow Up Instructions:   -I discussed the assessment and treatment plan with the patient's guardians. The patient's guardians were provided an opportunity to ask questions and all were answered. The patient's guardians agreed with the plan and demonstrated an understanding of the instructions.   The patient's guardians were advised to call back or seek an in-person evaluation if the symptoms worsen or if the condition fails to improve as anticipated.    , MD

## 2020-07-23 ENCOUNTER — Ambulatory Visit
Admission: RE | Admit: 2020-07-23 | Discharge: 2020-07-23 | Disposition: A | Discharge status: Home / Self Care | Payer: Medicare Other | Source: Ambulatory Visit | Attending: Family Medicine | Admitting: Family Medicine

## 2020-07-23 ENCOUNTER — Other Ambulatory Visit: Payer: Self-pay

## 2020-07-23 DIAGNOSIS — Z1231 Encounter for screening mammogram for malignant neoplasm of breast: Secondary | ICD-10-CM

## 2020-07-24 ENCOUNTER — Telehealth: Payer: Self-pay

## 2020-07-24 LAB — PHENYTOIN LEVEL, TOTAL: Phenytoin, Total: 20.6 mg/L — ABNORMAL HIGH (ref 10.0–20.0)

## 2020-07-24 NOTE — Telephone Encounter (Signed)
Pt called no answer left a voice mail to call the office back to go over lab results,

## 2020-07-24 NOTE — Telephone Encounter (Signed)
-----   Message from Karen M Aquino, MD sent at 07/24/2020 11:35 AM EST ----- Pls let sister [name] know the Dilantin level looks much better, 20.6. Continue on Dilantin 100mg in AM, 200mg in PM as discussed. Thanks 

## 2020-07-24 NOTE — Telephone Encounter (Signed)
Spoke with pt sister Kaitlyn Powell Dilantin level looks much better, 20.6. Continue on Dilantin 100mg  in AM, 200mg  in PM as discussed

## 2020-07-24 NOTE — Telephone Encounter (Signed)
-----   Message from Van Clines, MD sent at 07/24/2020 11:35 AM EST ----- Pls let sister [name] know the Dilantin level looks much better, 20.6. Continue on Dilantin 100mg  in AM, 200mg  in PM as discussed. Thanks

## 2020-08-07 ENCOUNTER — Telehealth: Payer: Self-pay | Admitting: Gastroenterology

## 2020-08-07 NOTE — Telephone Encounter (Signed)
Spoke with Kaitlyn Powell. The patient's hemorrhoid has increased in size to about a nickle. If she has a difficult hard bowel movement, it bleeds. If she has a smooth bowel movement, there is no bleeding. They are using daily Smooth Move tea and fiber supplement. If the patient does not have a bowel movement for 2 days, she will be given liquid senna, which does work. The concern is the increase in size and that it is protruding now. I moved her appointment to 08/09/20 at 3:50 pm. She is on Xarelto.  Would you be able to do this appointment virtually? Is she a candidate for banding?

## 2020-08-08 ENCOUNTER — Other Ambulatory Visit: Payer: Self-pay

## 2020-08-08 MED ORDER — HYDROCORTISONE ACETATE 25 MG RE SUPP: 25.0000 mg | Freq: Two times a day (BID) | RECTAL | 0 refills | Status: AC

## 2020-08-08 NOTE — Telephone Encounter (Signed)
I understand that its difficult for her family to bring [facility] for appointment. Lets try Anusol suppository twice daily X 5 days. Continue with bowel regimen. Change position bed to chair and turn every 2-3 hours. We will schedule follow up visit if still symptomatic with persistent bleeding.

## 2020-08-08 NOTE — Telephone Encounter (Signed)
Spoke with Kaitlyn Powell and Mr Kaitlyn Powell on speaker phone. Explained the plan of care. Confirmed they were comfortable administering the suppositories. Confirmed pharmacy. Encouraged to call back if she acutely worsens, questions or concerns. Otherwise progress up date over the telephone after 5 days.

## 2020-08-09 ENCOUNTER — Ambulatory Visit: Payer: Medicare Other | Admitting: Gastroenterology

## 2020-08-10 ENCOUNTER — Other Ambulatory Visit: Payer: Self-pay | Admitting: Pulmonary Disease

## 2020-08-12 ENCOUNTER — Ambulatory Visit: Payer: Medicare Other | Admitting: Gastroenterology

## 2020-08-12 NOTE — Telephone Encounter (Signed)
Received refill request from [facility]  Medication name/strength/dose: Pulmicort 0.5mg /89ml nebulizer solution Medication last rx'd: 05/15/2020 Quantity and number of refills last rx'd: 0 Instructions: Take (0.5mg  total) by nebulization in the morning and at bedtime  Last OV: 05/15/2020 Next OV: None sceduled  Dr Everardo All please advise on refill request  Allergies  Allergen Reactions  . Cephalexin Other (See Comments)    Seizures   . Namenda [Memantine Hcl] Other (See Comments)    Made memory issues worse, sleepiness, increased confusion   Current Outpatient Medications on File Prior to Visit  Medication Sig Dispense Refill  . albuterol (PROVENTIL) (2.5 MG/3ML) 0.083% nebulizer solution Take 2.5 mg by nebulization every 6 (six) hours as needed for wheezing or shortness of breath.    . budesonide (PULMICORT) 0.5 MG/2ML nebulizer solution Take 2 mLs (0.5 mg total) by nebulization in the morning and at bedtime. 120 mL 0  . cannabidiol (EPIDIOLEX) 100 MG/ML solution Give 3.7 mL twice a day 222 mL 5  . carbidopa-levodopa (SINEMET IR) 25-100 MG tablet Place 2 tablets into feeding tube 3 (three) times daily. Take 2 tablet three times a day with meals (Patient taking differently: Place 2 tablets into feeding tube 3 (three) times daily. Take 2 tablet three times a day with meals at (8am, 12 pm, 4pm)) 540 tablet 3  . diclofenac Sodium (VOLTAREN) 1 % GEL Apply 2 g topically 4 (four) times daily. To affected joint. 350 g 6  . Elastic Bandages & Supports (GAIT/TRANSFER BELT) MISC 1 Product by Does not apply route daily. 1 each 3  . esomeprazole (NEXIUM) 10 MG packet Take 10 mg by mouth daily before breakfast. 30 each 2  . folic acid (FOLVITE) 1 MG tablet Take 1 mg by mouth daily.    . furosemide (LASIX) 20 MG tablet Place 1 tablet (20 mg total) into feeding tube daily as needed for fluid or edema (to maintain dry weight.). 30 tablet 11  . hydrocortisone (ANUSOL-HC) 25 MG suppository Place 1  suppository (25 mg total) rectally 2 (two) times daily for 5 days. 10 suppository 0  . Incontinence Supplies (EXTENSION TUBING/CONNECTOR) MISC Use with PEG feedings change every 5 days Mic-key extension 6 each 11  . ipratropium (ATROVENT) 0.02 % nebulizer solution Take 2.5 mLs (0.5 mg total) by nebulization every 4 (four) hours as needed for wheezing or shortness of breath. 450 mL 0  . ipratropium-albuterol (DUONEB) 0.5-2.5 (3) MG/3ML SOLN Take 3 mLs by nebulization every 6 (six) hours as needed. 360 mL 12  . levETIRAcetam (KEPPRA) 100 MG/ML solution Give 3mL (500mg ) in AM, 13mL (1000mg ) in PM 473 mL 11  . levothyroxine (SYNTHROID) 150 MCG tablet Place 150 mcg into feeding tube daily before breakfast.    . NONFORMULARY OR COMPOUNDED ITEM Antifungal solution: Terbinafine 3%, Fluconazole 2%, Tea Tree Oil 5%, Urea 10%, Ibuprofen 2% in DMSO suspension #45mL 1 each 3  . OXYGEN Inhale 3 L into the lungs as directed. With CPAP    . phenytoin (DILANTIN) 125 MG/5ML suspension Give 30mL (200mg ) twice a day by PEG tube 480 mL 11  . predniSONE (DELTASONE) 10 MG tablet Take as directed per Dr. 31m (Patient not taking: Reported on 07/19/2020) 10 tablet 0  . Sennosides (SENNA) 8.8 MG/5ML SYRP TAKE 10 MLS BY MOUTH DAILY AS NEEDED 237 mL 1  . simvastatin (ZOCOR) 40 MG tablet TAKE 40 MG BY MOUTH DAILY AT BEDTIME (Patient taking differently: Take 40 mg by mouth at bedtime.) 90 tablet 3  .  sodium chloride (MURO 128) 5 % ophthalmic ointment Place 1 application into both eyes at bedtime.    . SYRINGE DISPOSABLE 60CC 60 ML MISC 1 syringe per feeding three times a day 90 each 12  . vitamin B-12 (CYANOCOBALAMIN) 100 MCG tablet Place 100 mcg into feeding tube daily.     [name] 20 MG TABS tablet TAKE 1 TABLET (20 MG TOTAL) BY MOUTH DAILY WITH SUPPER. 90 tablet 1  . [DISCONTINUED] Omeprazole Magnesium (PRILOSEC) 10 MG PACK 20 mg by PEG Tube route daily before breakfast. 60 each 3   No current facility-administered  medications on file prior to visit.

## 2020-08-13 ENCOUNTER — Other Ambulatory Visit: Payer: Self-pay | Admitting: Pulmonary Disease

## 2020-08-14 ENCOUNTER — Ambulatory Visit (INDEPENDENT_AMBULATORY_CARE_PROVIDER_SITE_OTHER): Payer: Medicare Other

## 2020-08-14 DIAGNOSIS — I442 Atrioventricular block, complete: Secondary | ICD-10-CM | POA: Diagnosis not present

## 2020-08-14 LAB — CUP PACEART REMOTE DEVICE CHECK
Battery Remaining Longevity: 126 mo
Battery Remaining Percentage: 100 %
Brady Statistic RA Percent Paced: 22 %
Brady Statistic RV Percent Paced: 16 %
Date Time Interrogation Session: 20220111185000
Implantable Lead Implant Date: 20170508
Implantable Lead Implant Date: 20170508
Implantable Lead Location: 753859
Implantable Lead Location: 753860
Implantable Lead Model: 7740
Implantable Lead Model: 7741
Implantable Lead Serial Number: 1111
Implantable Lead Serial Number: 1111
Implantable Pulse Generator Implant Date: 20170508
Lead Channel Impedance Value: 626 Ohm
Lead Channel Impedance Value: 635 Ohm
Lead Channel Pacing Threshold Amplitude: 0.9 V
Lead Channel Pacing Threshold Pulse Width: 0.4 ms
Lead Channel Setting Pacing Amplitude: 2 V
Lead Channel Setting Pacing Amplitude: 5 V
Lead Channel Setting Pacing Pulse Width: 1 ms
Lead Channel Setting Sensing Sensitivity: 2.5 mV
Pulse Gen Serial Number: 747619

## 2020-08-26 ENCOUNTER — Other Ambulatory Visit: Payer: Self-pay | Admitting: Neurology

## 2020-08-26 ENCOUNTER — Other Ambulatory Visit: Payer: Self-pay

## 2020-08-26 MED ORDER — EPIDIOLEX 100 MG/ML PO SOLN: ORAL | 1 refills | Status: DC

## 2020-08-28 NOTE — Progress Notes (Signed)
Remote pacemaker transmission.   

## 2020-09-05 ENCOUNTER — Ambulatory Visit: Payer: Medicare Other | Admitting: Gastroenterology

## 2020-09-05 IMAGING — DX DG CHEST 2V
2 series · 2 of 2 positions shown · non-contrast
Comparison: Chest x-ray dated August 01, 2018.

CLINICAL DATA: Follow-up aspiration pneumonia.

EXAM:
CHEST - 2 VIEW

[chest pa]
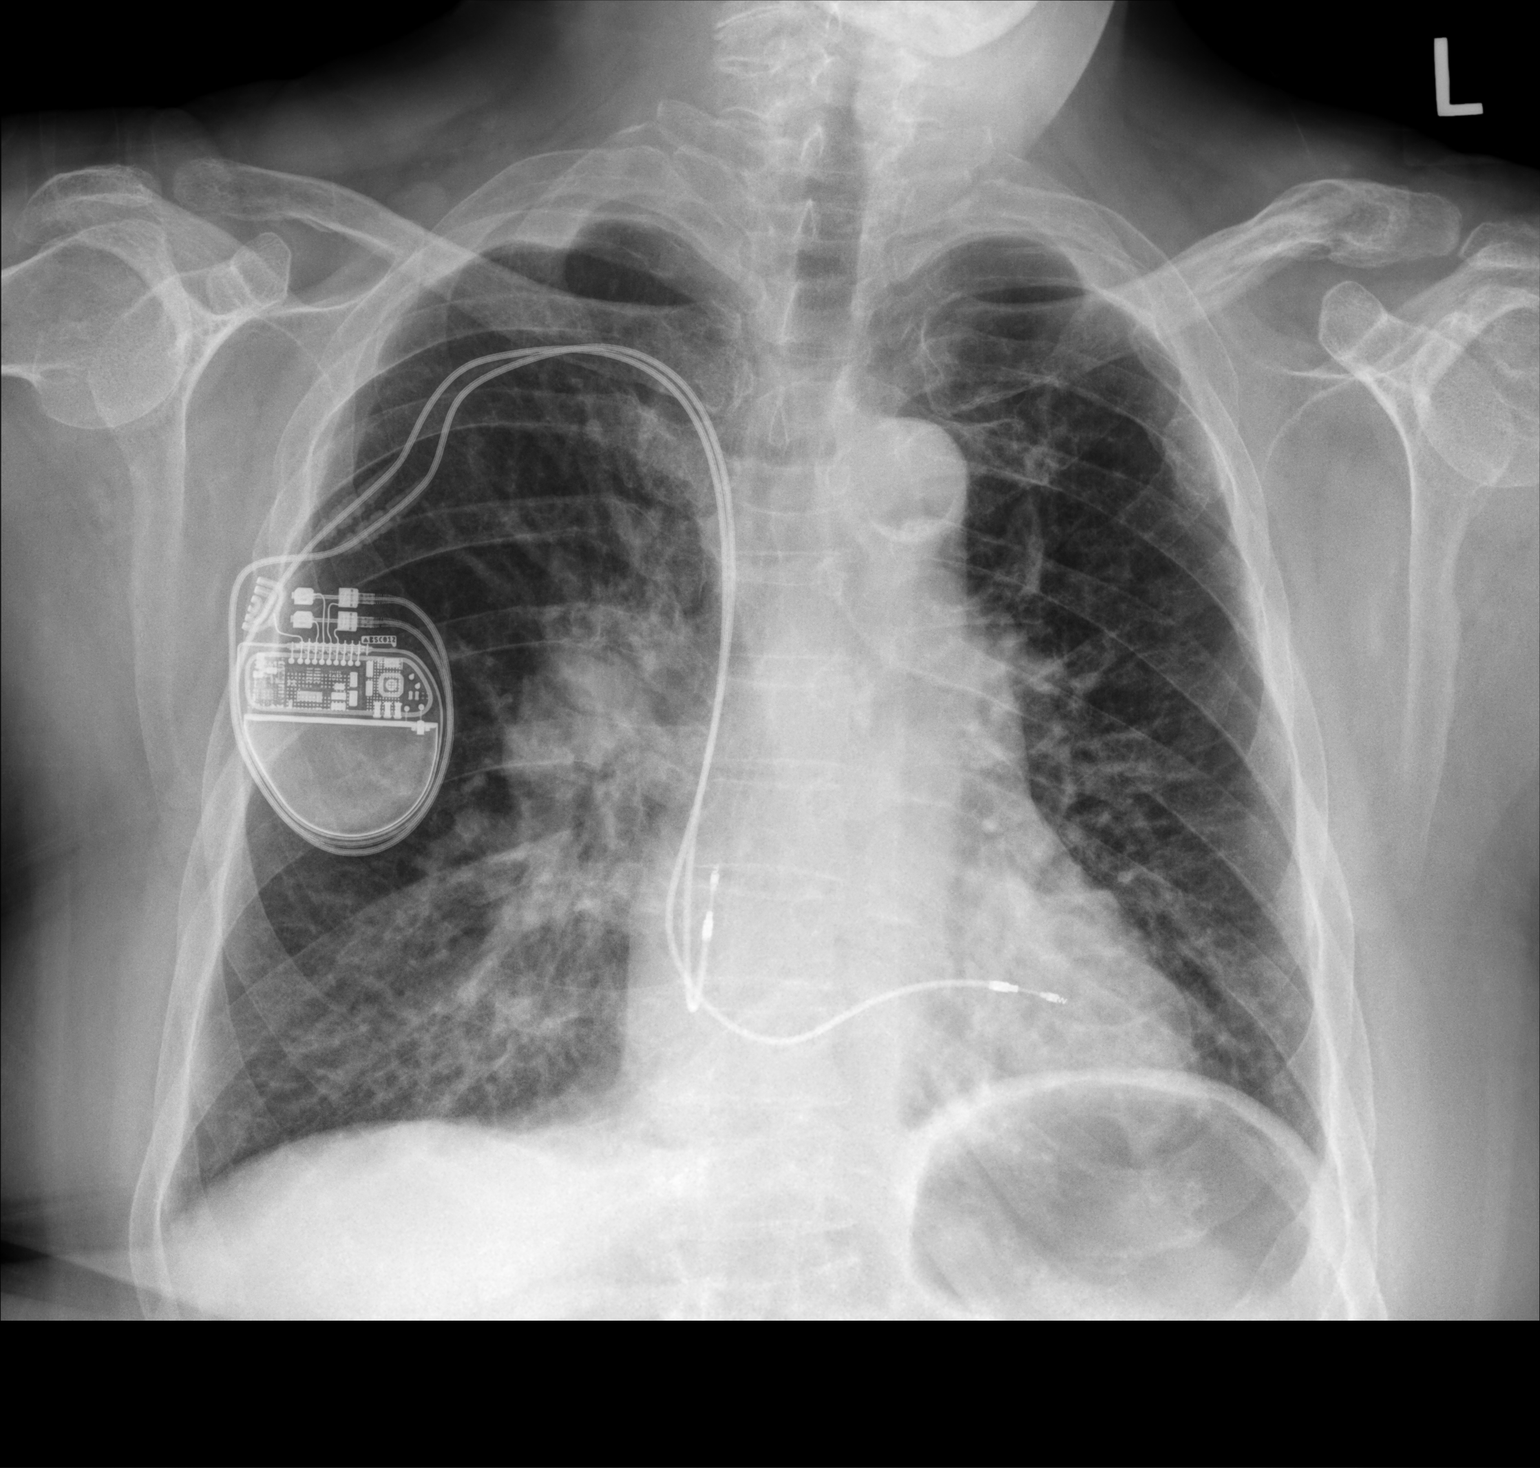

[chest lat]
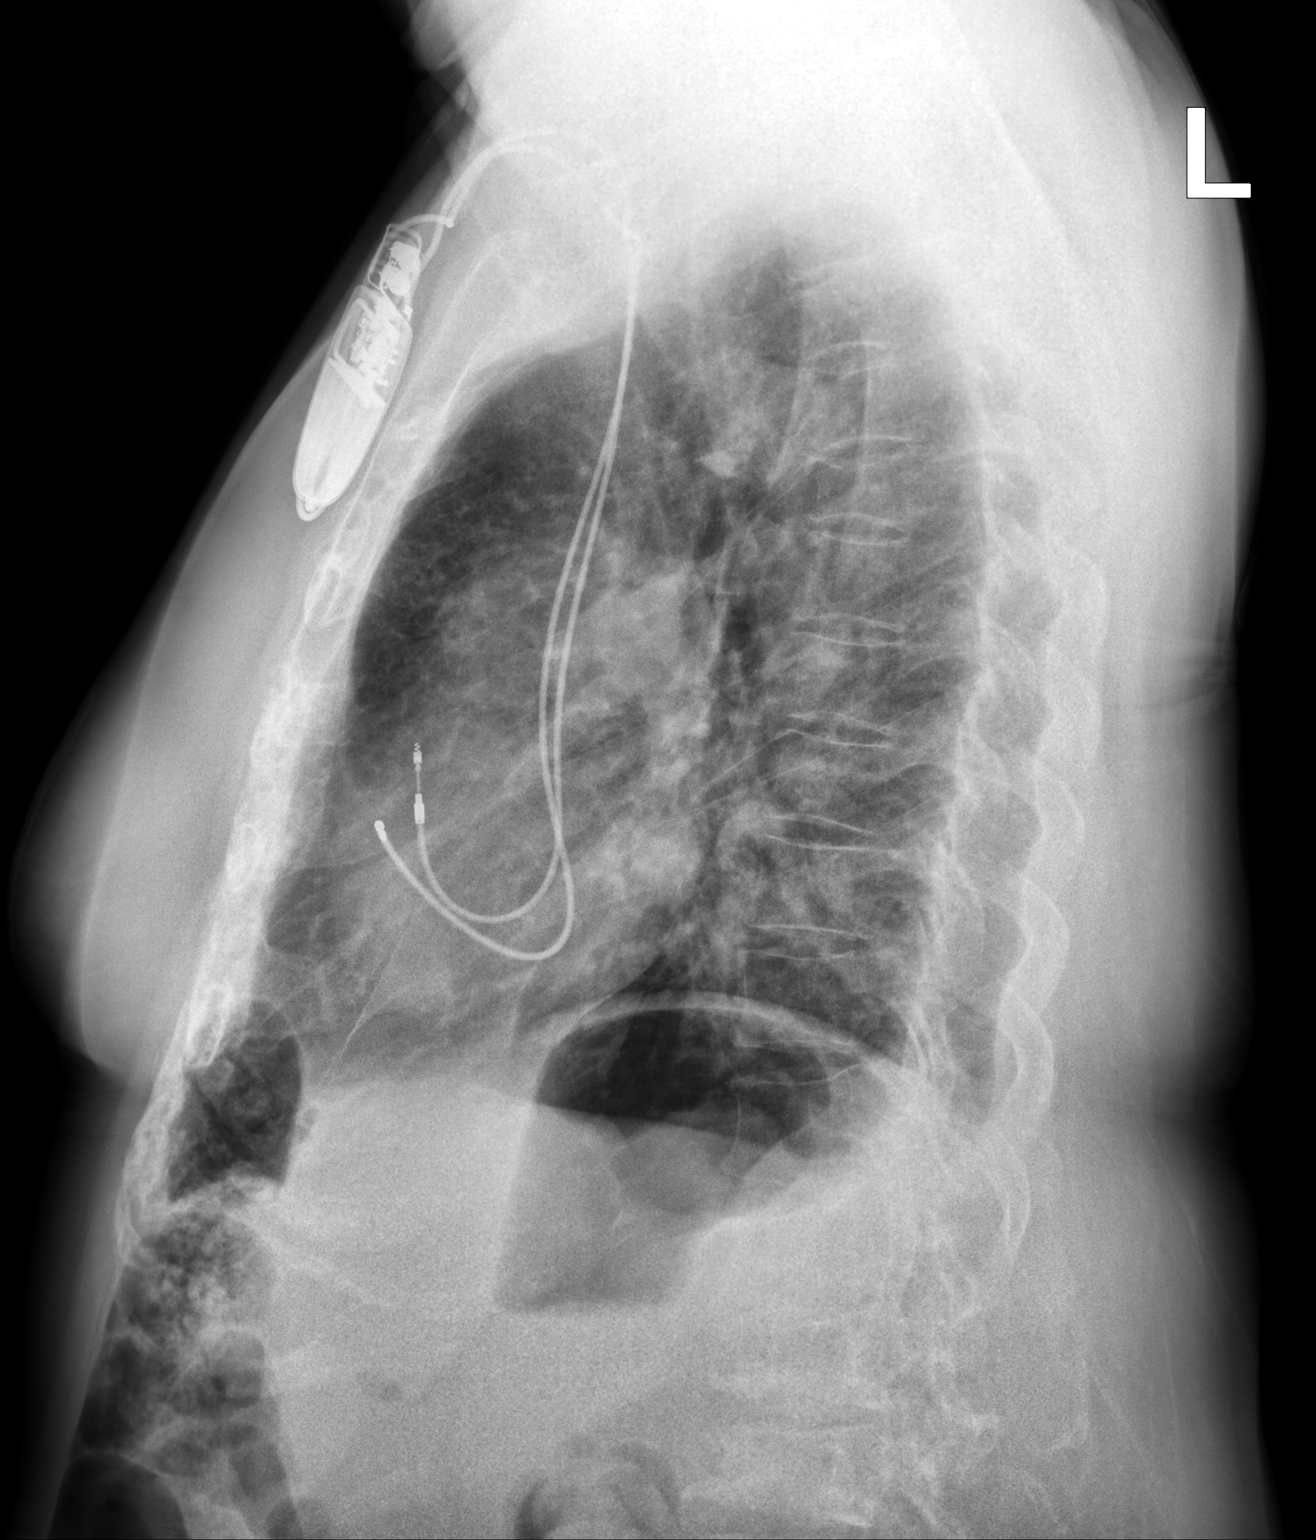

[2 of 2 positions shown; findings below may reference images not displayed]

FINDINGS: Unchanged right chest wall pacemaker. Stable mild cardiomegaly.
Unchanged chronic prominence of the right hilum. Mild bibasilar
interstitial thickening. Improving patchy right lower lobe opacity.
No pleural effusion or pneumothorax. No acute osseous abnormality.
IMPRESSION: 1. Improving right lower lobe pneumonia.
2. New mild pulmonary interstitial edema.

## 2020-09-06 ENCOUNTER — Telehealth: Payer: Self-pay | Admitting: Pulmonary Disease

## 2020-09-06 NOTE — Telephone Encounter (Signed)
Spoke with patient's sister [name]. She stated that for the first time ever, [employer] has requested the patient to be requalified for O2. She stated that the patient is only using the O2 at night, not during the day. She stated that it is hard to get the patient in for an appt and with COVID still on the rise, she would like to have a televisit. She is not having any respiratory distress.   I advised her that for her to requalified for nighttime O2, she will probably need an ONO as a qualifying walk would not work. She verbalized understanding.   She has been scheduled for a televisit at 245 on 09/12/20 with JE. Nothing further needed at time of call.

## 2020-09-10 ENCOUNTER — Other Ambulatory Visit: Payer: Self-pay

## 2020-09-10 ENCOUNTER — Telehealth: Payer: Self-pay | Admitting: Gastroenterology

## 2020-09-10 MED ORDER — OSMOLITE 1.2 CAL PO LIQD: ORAL | 11 refills | Status: AC

## 2020-09-10 NOTE — Telephone Encounter (Signed)
Needing new Rx for Osmolite 1 can Osmolite 1.2 bolus feeding daily Fax to Attn Nutrition at [phone]

## 2020-09-10 NOTE — Telephone Encounter (Signed)
Pt's sister [name] is requesting a call back from a nurse to see if a new script could be faxed over to the pt's Nutritionist. Caller states it needs to be specific and she would like to review with the nurse first.  Fax: [phone] [facility] nutrition care

## 2020-09-12 ENCOUNTER — Other Ambulatory Visit: Payer: Self-pay

## 2020-09-12 ENCOUNTER — Encounter: Payer: Self-pay | Admitting: Pulmonary Disease

## 2020-09-12 ENCOUNTER — Ambulatory Visit (INDEPENDENT_AMBULATORY_CARE_PROVIDER_SITE_OTHER): Payer: Medicare Other | Admitting: Pulmonary Disease

## 2020-09-12 DIAGNOSIS — J45909 Unspecified asthma, uncomplicated: Secondary | ICD-10-CM | POA: Diagnosis not present

## 2020-09-12 DIAGNOSIS — J690 Pneumonitis due to inhalation of food and vomit: Secondary | ICD-10-CM | POA: Diagnosis not present

## 2020-09-12 DIAGNOSIS — G4733 Obstructive sleep apnea (adult) (pediatric): Secondary | ICD-10-CM

## 2020-09-12 DIAGNOSIS — G4734 Idiopathic sleep related nonobstructive alveolar hypoventilation: Secondary | ICD-10-CM | POA: Diagnosis not present

## 2020-09-12 MED ORDER — AMOXICILLIN-POT CLAVULANATE 875-125 MG PO TABS: 1.0000 | ORAL_TABLET | Freq: Two times a day (BID) | ORAL | 0 refills | Status: AC

## 2020-09-12 MED ORDER — GUAIFENESIN 300 MG/15ML PO SOLN: 30.0000 mL | Freq: Four times a day (QID) | ORAL | 3 refills | Status: AC | PRN

## 2020-09-12 NOTE — Patient Instructions (Signed)
Aspiration/Increased secretions --ORDER guaifenesin q4h as needed --START Augmentin x 5 days  Nocturnal Hypoxemia --ORDER overnight oximetry on room air  Asthma  --CONTINUE Pulmicort nebulizer TWICE a day --CONTINUE Duonebs nebulizer TWICE a day   OSA on BiPAP --CONTINUE BiPAP nightly and when napping  Follow-up with me in 3 months

## 2020-09-12 NOTE — Progress Notes (Signed)
Virtual Visit via Telephone Note  I connected with Kaitlyn Powell on 09/27/20 at  2:45 PM EST by telephone and verified that I am speaking with the correct person using two identifiers.  Location: Patient: Home Provider: [facility]   I discussed the limitations, risks, security and privacy concerns of performing an evaluation and management service by telephone and the availability of in person appointments. I also discussed with the patient that there may be a patient responsible charge related to this service. The patient expressed understanding and agreed to proceed.   I discussed the assessment and treatment plan with the patient. The patient was provided an opportunity to ask questions and all were answered. The patient agreed with the plan and demonstrated an understanding of the instructions.   The patient was advised to call back or seek an in-person evaluation if the symptoms worsen or if the condition fails to improve as anticipated.  I provided 21 minutes of non-face-to-face time during this encounter.   Earnest Mcgillis Mechele Collin, MD    Subjective:   PATIENT ID: Kaitlyn Powell GENDER: female DOB: 12-09-1955, MRN: [id number]   HPI  Chief Complaint  Patient presents with  . Follow-up    Recertification for O2 @ 3L @ bedtime.     Reason for Visit: Follow-up for recent hospitalization  [name] is a 65 year old female with Parkinson's disease and OSA on BiPAP who presents for follow-up.  Patient is unable to participate in telephone visit. Her caregiver and sister, [name], is on the phone today.  Since our last visit, she has had increased clear secretions. She has received mucinex as needed with some response. Her cough is unchanged and weak, making it difficult to clear secretions. She is not in respiratory distress. Denies fevers, chills. Has been compliant with home home Pulmicort and Duonebs. Her oxygen levels are appropriate during the day and she  wears O2 at night. Her DME has recently requested for patient to be requalified.  I have personally reviewed patient's past medical/family/social history/allergies/current medications.  Past Medical History:  Diagnosis Date  . Arthritis   . Aspiration pneumonia (HCC)   . Asthma   . Cardiac arrhythmia    have pacemeaker  . Down syndrome   . DVT of popliteal vein (HCC) 09/08/2018   chronic  . GERD (gastroesophageal reflux disease)   . Heart block   . Hypothyroidism   . Mental retardation   . Pacemaker   . Parkinson's disease (HCC) 05/2019  . Pulmonary emboli (HCC)   . Seizures (HCC)   . Sleep apnea with use of continuous positive airway pressure (CPAP)      Allergies  Allergen Reactions  . Cephalexin Other (See Comments)    Seizures   . Namenda [Memantine Hcl] Other (See Comments)    Made memory issues worse, sleepiness, increased confusion     Outpatient Medications Prior to Visit  Medication Sig Dispense Refill  . albuterol (PROVENTIL) (2.5 MG/3ML) 0.083% nebulizer solution Take 2.5 mg by nebulization every 6 (six) hours as needed for wheezing or shortness of breath.    . budesonide (PULMICORT) 0.5 MG/2ML nebulizer solution INHALE 1 VIAL BY NEBULIZATION IN THE MORNING AND AT BEDTIME. 120 mL 11  . cannabidiol (EPIDIOLEX) 100 MG/ML solution GIVE 3.7 ML BY MOUTH 2 TIMES A DAY. REFILLS CALL: [phone]. 222 mL 1  . carbidopa-levodopa (SINEMET IR) 25-100 MG tablet Place 2 tablets into feeding tube 3 (three) times daily. Take 2 tablet three times a  day with meals (Patient taking differently: Place 2 tablets into feeding tube 3 (three) times daily. Take 2 tablet three times a day with meals at (8am, 12 pm, 4pm)) 540 tablet 3  . diclofenac Sodium (VOLTAREN) 1 % GEL Apply 2 g topically 4 (four) times daily. To affected joint. 350 g 6  . Elastic Bandages & Supports (GAIT/TRANSFER BELT) MISC 1 Product by Does not apply route daily. 1 each 3  . esomeprazole (NEXIUM) 10 MG packet Take  10 mg by mouth daily before breakfast. 30 each 2  . folic acid (FOLVITE) 1 MG tablet Take 1 mg by mouth daily.    . furosemide (LASIX) 20 MG tablet Place 1 tablet (20 mg total) into feeding tube daily as needed for fluid or edema (to maintain dry weight.). 30 tablet 11  . Incontinence Supplies (EXTENSION TUBING/CONNECTOR) MISC Use with PEG feedings change every 5 days Mic-key extension 6 each 11  . ipratropium-albuterol (DUONEB) 0.5-2.5 (3) MG/3ML SOLN Take 3 mLs by nebulization every 6 (six) hours as needed. 360 mL 12  . levETIRAcetam (KEPPRA) 100 MG/ML solution Give 76mL (500mg ) in AM, 60mL (1000mg ) in PM 473 mL 11  . levothyroxine (SYNTHROID) 150 MCG tablet Place 150 mcg into feeding tube daily before breakfast.    . NONFORMULARY OR COMPOUNDED ITEM Antifungal solution: Terbinafine 3%, Fluconazole 2%, Tea Tree Oil 5%, Urea 10%, Ibuprofen 2% in DMSO suspension #24mL 1 each 3  . Nutritional Supplements (FEEDING SUPPLEMENT, OSMOLITE 1.2 CAL,) LIQD 1 can of Osmolite 1.2 bolus feeding daily Disp 30 day supply refill 1 year 237 mL 11  . OXYGEN Inhale 3 L into the lungs as directed. With CPAP    . phenytoin (DILANTIN) 125 MG/5ML suspension Give 36mL (200mg ) twice a day by PEG tube 480 mL 11  . Sennosides (SENNA) 8.8 MG/5ML SYRP TAKE 10 MLS BY MOUTH DAILY AS NEEDED 237 mL 1  . simvastatin (ZOCOR) 40 MG tablet TAKE 40 MG BY MOUTH DAILY AT BEDTIME (Patient taking differently: Take 40 mg by mouth at bedtime.) 90 tablet 3  . sodium chloride (MURO 128) 5 % ophthalmic ointment Place 1 application into both eyes at bedtime.    . SYRINGE DISPOSABLE 60CC 60 ML MISC 1 syringe per feeding three times a day 90 each 12  . vitamin B-12 (CYANOCOBALAMIN) 100 MCG tablet Place 100 mcg into feeding tube daily.     31m 20 MG TABS tablet TAKE 1 TABLET (20 MG TOTAL) BY MOUTH DAILY WITH SUPPER. 90 tablet 1  . ipratropium (ATROVENT) 0.02 % nebulizer solution Take 2.5 mLs (0.5 mg total) by nebulization every 4 (four) hours as  needed for wheezing or shortness of breath. 450 mL 0  . predniSONE (DELTASONE) 10 MG tablet Take as directed per Dr. 4m (Patient not taking: No sig reported) 10 tablet 0   No facility-administered medications prior to visit.    Review of Systems  Constitutional: Negative for chills, diaphoresis, fever, malaise/fatigue and weight loss.  HENT: Negative for congestion.        Increased respiratory secretions   Respiratory: Positive for cough. Negative for hemoptysis, sputum production, shortness of breath and wheezing.   Cardiovascular: Negative for chest pain, palpitations and leg swelling.     Objective:   There were no vitals filed for this visit.    Physical Exam: N/A  Data Reviewed:  Imaging: CT Chest 09/21/19 - Bibasilar consolidation with air bronchograms  PFT: None on file  Sleep Studies: PSG 09/10/15 - AHI  12.8. CPAP Titration 03/03/17 - Recommend CPAP 14 cmH20    Assessment & Plan:   Discussion:  Aspiration/Increased secretions --ORDER guaifenesin q4h as needed --START Augmentin x 5 days  Nocturnal Hypoxemia --ORDER overnight oximetry on room air  Asthma  --CONTINUE Pulmicort nebulizer TWICE a day --CONTINUE Duonebs nebulizer TWICE a day   OSA on BiPAP --CONTINUE BiPAP nightly and when napping  Health Maintenance Immunization History  Administered Date(s) Administered  . Influenza Inj Mdck Quad Pf 04/25/2019  . Influenza Split 05/03/2016, 04/16/2017  . Influenza, Quadrivalent, Recombinant, Inj, Pf 04/25/2019  . Influenza, Seasonal, Injecte, Preservative Fre 05/03/2016, 04/16/2017, 05/31/2017  . Influenza,inj,Quad PF,6+ Mos 04/22/2017, 05/02/2018, 05/15/2020  . Influenza-Unspecified 05/03/2016, 05/03/2019  . Janssen (J&J) SARS-COV-2 Vaccination 11/04/2019  . PFIZER(Purple Top)SARS-COV-2 Vaccination 09/02/2020  . Pneumococcal Polysaccharide-23 07/19/2017  . Pneumococcal-Unspecified 07/19/2017  . Tdap 11/01/2014   CT Lung Screen - not  qualified  Orders Placed This Encounter  Procedures  . Pulse oximetry, overnight    On room air.    Standing Status:   Future    Standing Expiration Date:   09/12/2021   Meds ordered this encounter  Medications  . amoxicillin-clavulanate (AUGMENTIN) 875-125 MG tablet    Sig: Take 1 tablet by mouth 2 (two) times daily for 5 days.    Dispense:  14 tablet    Refill:  0  . guaiFENesin 300 MG/15ML SOLN    Sig: 30 mLs by PEG Tube route every 6 (six) hours as needed.    Dispense:  840 mL    Refill:  3    Return in about 3 months (around 12/10/2020).  Herberta Pickron Mechele Collin, MD [facility] 09/12/2020 3:20 PM  Office Number [phone]

## 2020-09-13 ENCOUNTER — Telehealth: Payer: Self-pay | Admitting: Pulmonary Disease

## 2020-09-13 NOTE — Telephone Encounter (Signed)
Dr. Everardo All  , Sister called wants to know did you want Chest xray from visit this week .

## 2020-09-13 NOTE — Telephone Encounter (Signed)
Call returned to patient, confirmed DOB. Made of JE recommendations. Voiced understanding.   Nothing further needed at this time .

## 2020-09-13 NOTE — Telephone Encounter (Signed)
No chest XR. Complete antibiotics and if symptoms persistent we can bring her in for her CXR then.

## 2020-09-25 ENCOUNTER — Other Ambulatory Visit: Payer: Self-pay

## 2020-09-25 ENCOUNTER — Telehealth (INDEPENDENT_AMBULATORY_CARE_PROVIDER_SITE_OTHER): Payer: Medicare Other | Admitting: Neurology

## 2020-09-25 ENCOUNTER — Encounter: Payer: Self-pay | Admitting: Neurology

## 2020-09-25 VITALS — Ht 70.0 in | Wt 155.0 lb

## 2020-09-25 DIAGNOSIS — F0280 Dementia in other diseases classified elsewhere without behavioral disturbance: Secondary | ICD-10-CM | POA: Diagnosis not present

## 2020-09-25 DIAGNOSIS — Q909 Down syndrome, unspecified: Secondary | ICD-10-CM | POA: Diagnosis not present

## 2020-09-25 DIAGNOSIS — G40211 Localization-related (focal) (partial) symptomatic epilepsy and epileptic syndromes with complex partial seizures, intractable, with status epilepticus: Secondary | ICD-10-CM

## 2020-09-25 MED ORDER — EPIDIOLEX 100 MG/ML PO SOLN: ORAL | 5 refills | Status: DC

## 2020-09-25 NOTE — Progress Notes (Signed)
Virtual Visit via Video Note The purpose of this virtual visit is to provide medical care while limiting exposure to the novel coronavirus.    Consent was obtained for video visit:  Yes.   Answered questions that patient had about telehealth interaction:  Yes.   I discussed the limitations, risks, security and privacy concerns of performing an evaluation and management service by telemedicine. I also discussed with the patient that there may be a patient responsible charge related to this service. The patient expressed understanding and agreed to proceed.  Pt location: Home Physician Location: office Name of referring provider:  Tracey Harries, MD I connected with [name] at patients initiation/request on 09/25/2020 at 11:30 AM EST by video enabled telemedicine application and verified that I am speaking with the correct person using two identifiers. Pt MRN:  [id number] Pt DOB:  04-27-1956 Video Participants:  [name];  [name] (sister,POA)   History of Present Illness:  The patient had a virtual video visit on 09/25/2020. She was last seen 2 months ago for intractable epilepsy and dementia due to down syndrome. Her sister [name] and brother-in-law [name] are present to provide history. Since her last visit, there has been significant improvement in her mental status. She was previously lethargic on prior visits, however today is awake, smiling, answering in sentences ("I feel great," "I like you too"), trying to follow commands. On her last visit, Dilantin level was supratherapeutic at 31.1 and her TSH was also elevated. Dilantin dose was reduced to 100mg  in AM, 200mg  in PM and within 2-3 days she started becoming more alert. Repeat Dilantin level at this dose on 07/23/20 at 20.6. She is also on Epidiolex 3.2mL BID and Levetiracetam 500mg  in AM, 1000mg  in PM. She has not had any seizures since January 2021. With adjustment in Synthroid dose, as well as recent course of antibiotic by  her pulmonologist, all have seemed to help with her mental status. She is now day 5 with alertness, following conversations. She needs assistance with all ADLs, including eating. She is unable to feed herself, but has good appetite. No falls. Mobility is better, her left side still seems weaker. Stiffness comes and goes, family massages her and applies Bengay and gives occasional Tylenol. She is on Sinemet 25/100mg  2 tabs TID without side effects. She speaks in sentences, some days easy to understand, other times harder to understand.    History on Initial Assessment 09/02/2018: This is a pleasant 65 year old left-handed woman with a history of Down syndrome, sleep apnea, hypothyroidism, sick sinus syndrome s/p PPM, presenting for second opinion regarding seizures. reports that Cynthia's seizures started in her teens/early 76s, they would be walking and all of a sudden she would drop down. She would be awake and appear to lose all muscle tone, family would pick her up and she would be fine. She had been taking Dilantin for many years. 09/04/2018 reports a period of time where she was not having seizures, or at least they were not hearing any news from her group home. In 2017, she became quite ill and was treated in [name] for pneumonia, CHF, and pulmonary embolism. She was on Lovenox until November 2017. There was concern for macrocytic anemia with normal B12 level possibly due to prolonged Dilantin use, she had been on Dilantin 200mg  BID. She had an infected PEG tube and had a GTC lasting 20 minutes. She was seen by neurologist Dr. [name] in 2018 due to drop seizures occurring on  a daily basis with loss of consciousness lasting less than a minute. She was started on Levetiracetam, however with attempt to wean down Dilantin, she had several GTCs. She was restarted on Dilantin 200mg  BID and Keppra dose was reduced. She had an MRI brain without contrast in July 2018 which showed congenital microcephaly, moderate  diffuse volum loss, moderate chronic microvascular disease. Routine EEG reported moderate to severe background slowing with rare generalized spike slow waves. The daily episodes decreased in frequency and would only occur when she was on the toilet. Initially they were occurring when she was having a BM and family could visibly see her abdomen straining, however recently they have occurred while peeing and without any visible straining. She would be out for 10-15 seconds, then wake up looking like she is trying to grab something or moving her hands L>R like she is scratching her leg. She would be talking gibberish or incoherent like baby talk when she starts coming around. A few times she would black out and go backward like someone pushed her back hard. She had a 3-day ambulatory video EEG study in November 2018. Background was diffusely slow with sharp and spike and wave discharges in the left frontotemporal region, bihemispheric, bisynchronous spike and wave discharges, isolated bursts of generalized spike and wave discharges, independent discharges in the right frontotemporal region. In general, there was a bifrontal predominance of discharges including the presence of occasional 2-2.5 slow spike and wave discharges. There was decreased amplitude over the right temporal area. There were 6 push button events, all due to "drop seizure on toilet." With 5 events, there was note of diffuse slow background with several bursts of bilateral rhythmic delta activity/intermittent delta activity. It was noted that event #6 had generalized frontally predominant 2 Hz spike and wave activity lasting 17 seconds with some after coming slow waves. Keppra dose was increased to 2500mg  daily at one point, then dose was reduced to 1000mg  qhs. She was tried on Lamotrigine but could not tolerate 25mg  dose, "looked like she was institutionalized." On 08/01/18, could not wake her up in the morning, she would open her eyes but was  unresponsive. [name] sat her up and she started jerking for 10 minutes. They tried to stand her up after but they were dragging her like she was drunk and could not move her legs. She started waking up and family gave her medications, went back to sleep, then again had difficulty waking her up followed by upper body jerking when she was sat up. She was found to have a pneumonia. Keppra dose increased to 1000mg  BID in addition to Dilantin 200mg  BID. Family reports some days she would have 3 in one day, other times she would go 48 hours without an event. Last episode was yesterday. She is also on Risperdal 1mg  BID, family is unsure when or why this was prescribed, she is always happy and always positive.   Update 02/13/2019: Vimpat has been quite sedating to her, she is only on 25mg  qhs, higher doses caused significant drowsiness. She had a 72-hour ambulatory video EEG study from June 30-February 03, 2019 which I personally reviewed, baseline EEG was abnormal with frequent generalized spikes, independent epileptiform discharges over the left and right temporal regions. There were 3 push button events, all in the bathroom, with 2 of them she is sitting on the commode then suddenly jerks back and appears unresponsive for a few seconds. There were no clear epileptiform discharges during these episodes but there  was low voltage fast activity/muscle, different from her baseline. No EKG changes. The third episode was lethargy/decreased responsiveness with no body jerk. Her sister has also noticed that she is having more cognitive issues, having a harder time finding her words. She has noticed that she would hold her hand in a pincer position more often. She is currently drowsy after her meal, which is typical for her.   Prior AEDs: Lamotrigine, Vimpat, Topamax   Diagnostic Data:  MRI brain with and without contrast done 05/2019 showed severe brain atrophy, especially in the temporal lobes, confluent FLAIR hyperintensity in  the deep cerebral white matter, no acute changes.  Ambulatory EEG in 01/2019: This 65-hour ambulatory EEG study is abnormal due to the presence of: 1. Generalized background slowing 2. Frequent generalized 2-2.5 Hz slow spike and wave discharges 3. Multifocal epileptiform discharges over the bifrontal/frontopolar, left > right temporal regions 4. There were 3 episodes captured, 2 with typical drop attacks with associated diffuse background suppression      Current Outpatient Medications on File Prior to Visit  Medication Sig Dispense Refill  . albuterol (PROVENTIL) (2.5 MG/3ML) 0.083% nebulizer solution Take 2.5 mg by nebulization every 6 (six) hours as needed for wheezing or shortness of breath.    . budesonide (PULMICORT) 0.5 MG/2ML nebulizer solution INHALE 1 VIAL BY NEBULIZATION IN THE MORNING AND AT BEDTIME. 120 mL 11  . cannabidiol (EPIDIOLEX) 100 MG/ML solution GIVE 3.7 ML BY MOUTH 2 TIMES A DAY. REFILLS CALL: [phone]. 222 mL 1  . carbidopa-levodopa (SINEMET IR) 25-100 MG tablet Place 2 tablets into feeding tube 3 (three) times daily. Take 2 tablet three times a day with meals (Patient taking differently: Place 2 tablets into feeding tube 3 (three) times daily. Take 2 tablet three times a day with meals at (8am, 12 pm, 4pm)) 540 tablet 3  . diclofenac Sodium (VOLTAREN) 1 % GEL Apply 2 g topically 4 (four) times daily. To affected joint. 350 g 6  . Elastic Bandages & Supports (GAIT/TRANSFER BELT) MISC 1 Product by Does not apply route daily. 1 each 3  . esomeprazole (NEXIUM) 10 MG packet Take 10 mg by mouth daily before breakfast. 30 each 2  . folic acid (FOLVITE) 1 MG tablet Take 1 mg by mouth daily.    Marland Kitchen guaiFENesin 300 MG/15ML SOLN 30 mLs by PEG Tube route every 6 (six) hours as needed. 840 mL 3  . Incontinence Supplies (EXTENSION TUBING/CONNECTOR) MISC Use with PEG feedings change every 5 days Mic-key extension 6 each 11  . ipratropium-albuterol (DUONEB) 0.5-2.5 (3) MG/3ML SOLN Take  3 mLs by nebulization every 6 (six) hours as needed. 360 mL 12  . levETIRAcetam (KEPPRA) 100 MG/ML solution Give 56mL (500mg ) in AM, 24mL (1000mg ) in PM 473 mL 11  . levothyroxine (SYNTHROID) 150 MCG tablet Place 150 mcg into feeding tube daily before breakfast.    . NONFORMULARY OR COMPOUNDED ITEM Antifungal solution: Terbinafine 3%, Fluconazole 2%, Tea Tree Oil 5%, Urea 10%, Ibuprofen 2% in DMSO suspension #3mL 1 each 3  . Nutritional Supplements (FEEDING SUPPLEMENT, OSMOLITE 1.2 CAL,) LIQD 1 can of Osmolite 1.2 bolus feeding daily Disp 30 day supply refill 1 year 237 mL 11  . OXYGEN Inhale 3 L into the lungs as directed. With CPAP    . phenytoin (DILANTIN) 125 MG/5ML suspension Give 35mL (200mg ) twice a day by PEG tube (Patient taking differently: Give 73mL (200mg ) peg in the afternoon, 60ml in am now total 49ml daily) 480 mL 11  .  Sennosides (SENNA) 8.8 MG/5ML SYRP TAKE 10 MLS BY MOUTH DAILY AS NEEDED 237 mL 1  . simvastatin (ZOCOR) 40 MG tablet TAKE 40 MG BY MOUTH DAILY AT BEDTIME (Patient taking differently: Take 40 mg by mouth at bedtime.) 90 tablet 3  . sodium chloride (MURO 128) 5 % ophthalmic ointment Place 1 application into both eyes at bedtime.    . SYRINGE DISPOSABLE 60CC 60 ML MISC 1 syringe per feeding three times a day 90 each 12  . vitamin B-12 (CYANOCOBALAMIN) 100 MCG tablet Place 100 mcg into feeding tube daily.     [name]. XARELTO 20 MG TABS tablet TAKE 1 TABLET (20 MG TOTAL) BY MOUTH DAILY WITH SUPPER. 90 tablet 1  . furosemide (LASIX) 20 MG tablet Place 1 tablet (20 mg total) into feeding tube daily as needed for fluid or edema (to maintain dry weight.). (Patient not taking: Reported on 09/25/2020) 30 tablet 11  . ipratropium (ATROVENT) 0.02 % nebulizer solution Take 2.5 mLs (0.5 mg total) by nebulization every 4 (four) hours as needed for wheezing or shortness of breath. 450 mL 0  . predniSONE (DELTASONE) 10 MG tablet Take as directed per Dr. Everardo AllEllison (Patient not taking: No sig reported)  10 tablet 0  . [DISCONTINUED] Omeprazole Magnesium (PRILOSEC) 10 MG PACK 20 mg by PEG Tube route daily before breakfast. 60 each 3   No current facility-administered medications on file prior to visit.     Observations/Objective:   Vitals:   09/25/20 1041  Weight: 155 lb (70.3 kg)  Height: 5\' 10"  (1.778 m)   GEN:  The patient appears stated age and is in NAD.  Neurological examination: Significant improvement in mental status, she is awake today, alert, smiling, talking in sentences (sometimes hard to understand, other times better "I feel great."). Mild dysarthria. Difficulty with following commands. Cranial nerves: Extraocular movements intact. No facial asymmetry. Motor: moves both UE extremities symmetrically, at least anti-gravity. No incoordination on finger to nose testing (touches forehead instead of nose). Gait: not tested   Assessment and Plan:   This is a pleasant 65 yo LH woman with a history of Down syndrome with dementia, sleep apnea, hypothyroidism, sick sinus syndrome s/p PPM, and seizures. Her 72-hour EEG captured episodes concerning for drop attacks with diffuse low voltage fast activity on EEG. She has had an excellent response to Epidiolex and has been seizure-free since January 2021. Mental status has significant improved since last visit with reduction in Dilantin dose, adjustment in Synthroid dose, and recent course of antibiotics. Continue Dilantin 100mg  in AM, 200mg  in PM, Levetiracetam 500mg  in AM, 1000mg  in PM and Epidiolex 3.687mL BID. Continue Sinemet 25/100mg  2 tabs TID. Discussed staying on same medications at this time as she has been doing much better compared to prior visits. Continue 24/7 care. Follow-up in 5-6 months, they know to call for any changes.    Follow Up Instructions:   -I discussed the assessment and treatment plan with the patient. The patient was provided an opportunity to ask questions and all were answered. The patient agreed with the plan and  demonstrated an understanding of the instructions.   The patient was advised to call back or seek an in-person evaluation if the symptoms worsen or if the condition fails to improve as anticipated.     Van ClinesKaren M Aquino, MD

## 2020-09-25 NOTE — Patient Instructions (Signed)
Great to see you doing so much better, Kaitlyn Powell! Continue all medications. Continue 24/7 care. Follow-up in 5-6 months, call for any changes.

## 2020-10-01 ENCOUNTER — Other Ambulatory Visit: Payer: Self-pay | Admitting: *Deleted

## 2020-10-01 DIAGNOSIS — R0902 Hypoxemia: Secondary | ICD-10-CM

## 2020-10-01 NOTE — Progress Notes (Addendum)
amb ref DME placed per JE to have the pt start on nocturnal oxygen at 3 liters due to hypoxia per sleep study.

## 2020-10-01 NOTE — Progress Notes (Signed)
Overnight Oximetry Report 09/18/20  SpO2<88% on RA for 1 hour 19 min 4 sec. Nadir O2 52% Interpretation: Qualified for oxygen  Please order patient 3L O2 overnight.  Mechele Collin, M.D. [facility]monary[facility]Critical [facility] 10/01/2020 4:52 PM

## 2020-10-03 NOTE — Addendum Note (Signed)
Addended by: Pilar Grammes on: 10/03/2020 04:27 PM   Modules accepted: Orders

## 2020-11-09 ENCOUNTER — Other Ambulatory Visit: Payer: Self-pay | Admitting: Internal Medicine

## 2020-11-11 NOTE — Telephone Encounter (Signed)
Pt's age 65, wt 70.3 kg, SCr 1.13, CrCl 55.82, last ov w/ Dr. Johney Frame 09/15/19 - overdue for f/u. Will route to scheduling to set up appt.

## 2020-11-13 ENCOUNTER — Ambulatory Visit (INDEPENDENT_AMBULATORY_CARE_PROVIDER_SITE_OTHER): Payer: Medicare Other

## 2020-11-13 DIAGNOSIS — I442 Atrioventricular block, complete: Secondary | ICD-10-CM | POA: Diagnosis not present

## 2020-11-15 LAB — CUP PACEART REMOTE DEVICE CHECK
Battery Remaining Longevity: 114 mo
Battery Remaining Percentage: 100 %
Brady Statistic RA Percent Paced: 21 %
Brady Statistic RV Percent Paced: 15 %
Date Time Interrogation Session: 20220413022100
Implantable Lead Implant Date: 20170508
Implantable Lead Implant Date: 20170508
Implantable Lead Location: 753859
Implantable Lead Location: 753860
Implantable Lead Model: 7740
Implantable Lead Model: 7741
Implantable Lead Serial Number: 1111
Implantable Lead Serial Number: 1111
Implantable Pulse Generator Implant Date: 20170508
Lead Channel Impedance Value: 654 Ohm
Lead Channel Impedance Value: 663 Ohm
Lead Channel Pacing Threshold Amplitude: 0.8 V
Lead Channel Pacing Threshold Pulse Width: 0.4 ms
Lead Channel Setting Pacing Amplitude: 2 V
Lead Channel Setting Pacing Amplitude: 5 V
Lead Channel Setting Pacing Pulse Width: 1 ms
Lead Channel Setting Sensing Sensitivity: 2.5 mV
Pulse Gen Serial Number: 747619

## 2020-11-20 ENCOUNTER — Telehealth: Payer: Self-pay

## 2020-11-20 NOTE — Telephone Encounter (Signed)
Pt sister Kaitlyn Powell asking if Kaitlyn Powell can have something other than tylenol to help with her stiffness any muscle relaxant?  Also asking if you can add to your note about a hoyer life. Saying that she will need it for the rest of her life to help with getting her up and down,

## 2020-11-28 NOTE — Progress Notes (Signed)
Remote pacemaker transmission.   

## 2020-12-04 ENCOUNTER — Encounter: Payer: Self-pay | Admitting: Neurology

## 2020-12-04 MED ORDER — CYCLOBENZAPRINE HCL 5 MG PO TABS: ORAL_TABLET | ORAL | 4 refills | Status: AC

## 2020-12-04 NOTE — Telephone Encounter (Signed)
Replied on MyChart

## 2020-12-09 ENCOUNTER — Other Ambulatory Visit: Payer: Self-pay | Admitting: Gastroenterology

## 2020-12-09 ENCOUNTER — Other Ambulatory Visit: Payer: Self-pay | Admitting: Internal Medicine

## 2020-12-13 ENCOUNTER — Telehealth (INDEPENDENT_AMBULATORY_CARE_PROVIDER_SITE_OTHER): Payer: Medicare Other | Admitting: Internal Medicine

## 2020-12-13 ENCOUNTER — Other Ambulatory Visit: Payer: Self-pay

## 2020-12-13 VITALS — BP 108/68 | HR 65 | Ht <= 58 in | Wt 165.0 lb

## 2020-12-13 DIAGNOSIS — I442 Atrioventricular block, complete: Secondary | ICD-10-CM | POA: Diagnosis not present

## 2020-12-13 DIAGNOSIS — F0390 Unspecified dementia without behavioral disturbance: Secondary | ICD-10-CM

## 2020-12-13 DIAGNOSIS — I82531 Chronic embolism and thrombosis of right popliteal vein: Secondary | ICD-10-CM

## 2020-12-13 NOTE — Progress Notes (Signed)
Electrophysiology TeleHealth Note   Due to national recommendations of social distancing due to COVID 19, an audio/video telehealth visit is felt to be most appropriate for this patient at this time.  See MyChart message from today for the patient's consent to telehealth for [facility].  Date:  12/13/2020   ID:  Kaitlyn Powell, DOB October 09, 1955, MRN [id number]  Location: patient's home  Provider location:  [place]  Evaluation Performed: Follow-up visit  PCP:  Tracey Harries, MD   Electrophysiologist:  Dr Johney Frame  Chief Complaint:  SOB  History of Present Illness:    Kaitlyn Powell is a 65 y.o. female who presents via telehealth conferencing today.  Since last being seen in our clinic, the patient continues to decline.  She has dementia and is not very active.  she is not verbal today.  History is per her caregiver.  No recent complaints of CP or SOB.  Past Medical History:  Diagnosis Date  . Arthritis   . Aspiration pneumonia (HCC)   . Asthma   . Cardiac arrhythmia    have pacemeaker  . Down syndrome   . DVT of popliteal vein (HCC) 09/08/2018   chronic  . GERD (gastroesophageal reflux disease)   . Heart block   . Hypothyroidism   . Mental retardation   . Pacemaker   . Parkinson's disease (HCC) 05/2019  . Pulmonary emboli (HCC)   . Seizures (HCC)   . Sleep apnea with use of continuous positive airway pressure (CPAP)     Past Surgical History:  Procedure Laterality Date  . IR GASTROSTOMY TUBE MOD SED  10/04/2019  . IR RADIOLOGIST EVAL & MGMT  02/07/2020  . IR RADIOLOGIST EVAL & MGMT  02/28/2020  . IR REPLC GASTRO/COLONIC TUBE PERCUT W/FLUORO  12/14/2019  . IR REPLC GASTRO/COLONIC TUBE PERCUT W/FLUORO  01/04/2020  . IR REPLC GASTRO/COLONIC TUBE PERCUT W/FLUORO  03/19/2020  . PACEMAKER INSERTION  12/09/2015   [employer] Accolade MRI EL PPM implanted in PA for complete heart block and syncope  . PEG TUBE PLACEMENT     removed in 2017  . TIBIA FRACTURE  SURGERY Right     Current Outpatient Medications  Medication Sig Dispense Refill  . albuterol (PROVENTIL) (2.5 MG/3ML) 0.083% nebulizer solution Take 2.5 mg by nebulization every 6 (six) hours as needed for wheezing or shortness of breath.    . budesonide (PULMICORT) 0.5 MG/2ML nebulizer solution INHALE 1 VIAL BY NEBULIZATION IN THE MORNING AND AT BEDTIME. 120 mL 11  . cannabidiol (EPIDIOLEX) 100 MG/ML solution GIVE 3.7 ML BY MOUTH 2 TIMES A DAY. REFILLS CALL: [phone]. 222 mL 5  . carbidopa-levodopa (SINEMET IR) 25-100 MG tablet Place 2 tablets into feeding tube 3 (three) times daily. Take 2 tablet three times a day with meals 540 tablet 3  . cyclobenzaprine (FLEXERIL) 5 MG tablet Take 1/2 to 1 tablet at night as needed for muscle spasms 30 tablet 4  . diclofenac Sodium (VOLTAREN) 1 % GEL Apply 2 g topically 4 (four) times daily. To affected joint. 350 g 6  . Elastic Bandages & Supports (GAIT/TRANSFER BELT) MISC 1 Product by Does not apply route daily. 1 each 3  . esomeprazole (NEXIUM) 10 MG packet Take 10 mg by mouth daily before breakfast. 30 each 2  . folic acid (FOLVITE) 1 MG tablet Take 1 mg by mouth daily.    . Incontinence Supplies (EXTENSION TUBING/CONNECTOR) MISC Use with PEG feedings change every 5 days Mic-key extension 6 each  11  . ipratropium-albuterol (DUONEB) 0.5-2.5 (3) MG/3ML SOLN Take 3 mLs by nebulization every 6 (six) hours as needed. 360 mL 12  . levETIRAcetam (KEPPRA) 100 MG/ML solution Give 71mL (500mg ) in AM, 79mL (1000mg ) in PM 473 mL 11  . levothyroxine (SYNTHROID) 150 MCG tablet Place 150 mcg into feeding tube daily before breakfast.    . NONFORMULARY OR COMPOUNDED ITEM Antifungal solution: Terbinafine 3%, Fluconazole 2%, Tea Tree Oil 5%, Urea 10%, Ibuprofen 2% in DMSO suspension #80mL 1 each 3  . Nutritional Supplements (FEEDING SUPPLEMENT, OSMOLITE 1.2 CAL,) LIQD 1 can of Osmolite 1.2 bolus feeding daily Disp 30 day supply refill 1 year 237 mL 11  . OXYGEN Inhale  3 L into the lungs as directed. With CPAP    . phenytoin (DILANTIN) 125 MG/5ML suspension Give 39mL (200mg ) twice a day by PEG tube 480 mL 11  . rivaroxaban (XARELTO) 20 MG TABS tablet Take 1 tablet (20 mg total) by mouth daily with supper. NEEDS APPT W DR. 31m FOR REFILLS 30 tablet 1  . Sennosides (SENNA) 8.8 MG/5ML SYRP TAKE 10 MLS BY MOUTH DAILY AS NEEDED 237 mL 1  . simvastatin (ZOCOR) 40 MG tablet Take 1 tablet (40 mg total) by mouth at bedtime. Please keep upcoming appt for future refills. 90 tablet 0  . sodium chloride (MURO 128) 5 % ophthalmic ointment Place 1 application into both eyes at bedtime.    . SYRINGE DISPOSABLE 60CC 60 ML MISC 1 syringe per feeding three times a day 90 each 12  . vitamin B-12 (CYANOCOBALAMIN) 100 MCG tablet Place 100 mcg into feeding tube daily.      No current facility-administered medications for this visit.    Allergies:   Cephalexin and Namenda [memantine hcl]   Social History:  The patient  reports that she has never smoked. She has never used smokeless tobacco. She reports that she does not drink alcohol and does not use drugs.   ROS:  Please see the history of present illness.   All other systems are personally reviewed and negative.    Exam:    Vital Signs:  BP 108/68   Pulse 65   Ht 4\' 10"  (1.473 m)   Wt 165 lb (74.8 kg)   SpO2 95%   BMI 34.49 kg/m   Not verbal today. Alert but with advanced dementia  Labs/Other Tests and Data Reviewed:    Recent Labs: 05/09/2020: ALT 4; BUN 40; Creatinine, Ser 1.13; Hemoglobin 11.6; Platelets 122.0; Potassium 4.4; Sodium 131   Wt Readings from Last 3 Encounters:  12/13/20 165 lb (74.8 kg)  09/25/20 155 lb (70.3 kg)  07/19/20 155 lb (70.3 kg)     Last device remote is reviewed from [employer] PDF which reveals normal device function    ASSESSMENT & PLAN:    1.  Complete heart block Normal device function by remotes (up to date)(  2. Dementia We discussed palliative care and hospice.  They  think that timing is soon and will discuss with Dr 07/09/2020 on follow-up. They would not want her intubated but would want brief CPR if needed  3. Prior DVT/PTE Continue xarelto   Risks, benefits and potential toxicities for medications prescribed and/or refilled reviewed with patient today.     Follow-up:  Remotes,me in person as needed    Patient Risk:  after full review of this patients clinical status, I feel that they are at high risk at this time.  History is per care givers (family) todayt  Today, I have spent 25 minutes with the patient with telehealth technology discussing arrhythmia management .    SignedHillis Range, MD  12/13/2020 4:40 PM     [facility] [address] [place] [address] [phone] (office) [phone] (fax)

## 2020-12-16 ENCOUNTER — Telehealth: Payer: Self-pay

## 2020-12-16 NOTE — Telephone Encounter (Signed)
Spoke with [name] and they will send a remote transmission tonight as she forgot to do so this weekend. She is aware she will be contacted once the transmission is received during normal business hours. She understood

## 2020-12-16 NOTE — Telephone Encounter (Signed)
Unsuccessful telephone call to Cornelia Copa (sister and POA), to follow up on failure to send manual transmission over the weekend per Dr. Jenel Lucks request. [employer] compliant voicemail left requesting callback to [phone].

## 2020-12-16 NOTE — Telephone Encounter (Signed)
-----   Message from Hillis Range, MD sent at 12/13/2020  4:41 PM EDT ----- They are concerned about a loss of consciousness event on 11/25/20.  I do not see any alerts.  Last remote was from 11/13/20.  I have asked that they send a manual transmission when patient is near transmitter (over the weekend).    Daniel Nones, can you review and call them on Monday to let them know if there was anything?  Thanks! JA

## 2020-12-17 ENCOUNTER — Other Ambulatory Visit: Payer: Self-pay

## 2020-12-17 DIAGNOSIS — G20 Parkinson's disease: Secondary | ICD-10-CM

## 2020-12-17 DIAGNOSIS — Q909 Down syndrome, unspecified: Secondary | ICD-10-CM

## 2020-12-17 DIAGNOSIS — R413 Other amnesia: Secondary | ICD-10-CM

## 2020-12-17 DIAGNOSIS — R569 Unspecified convulsions: Secondary | ICD-10-CM

## 2020-12-17 IMAGING — DX CHEST - 2 VIEW
2 series · 2 of 2 positions shown · non-contrast
Comparison: 02/06/2018, 09/21/2018

CLINICAL DATA: Shortness of breath, pneumonia follow-up

EXAM:
CHEST - 2 VIEW

[chest pa]
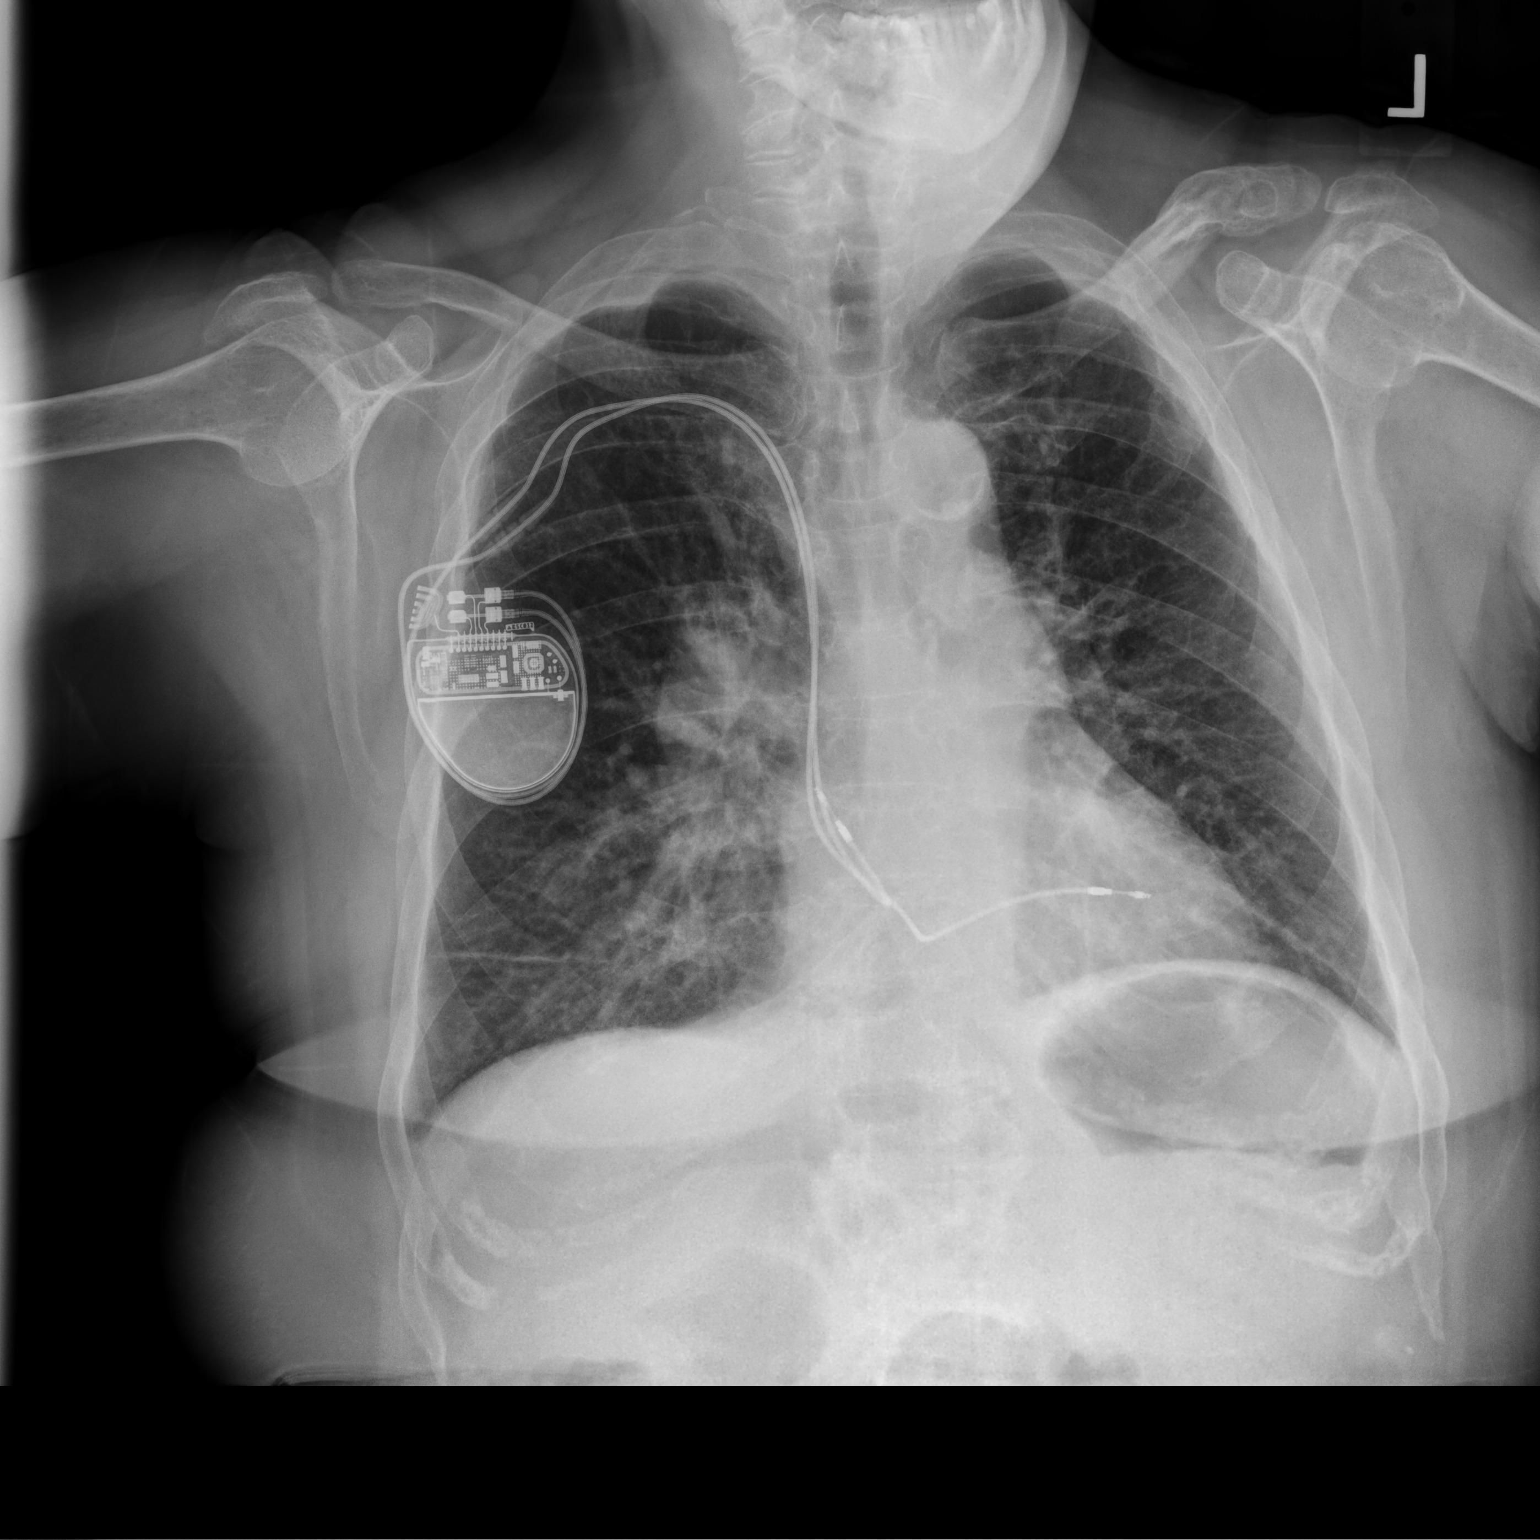

[chest lat]
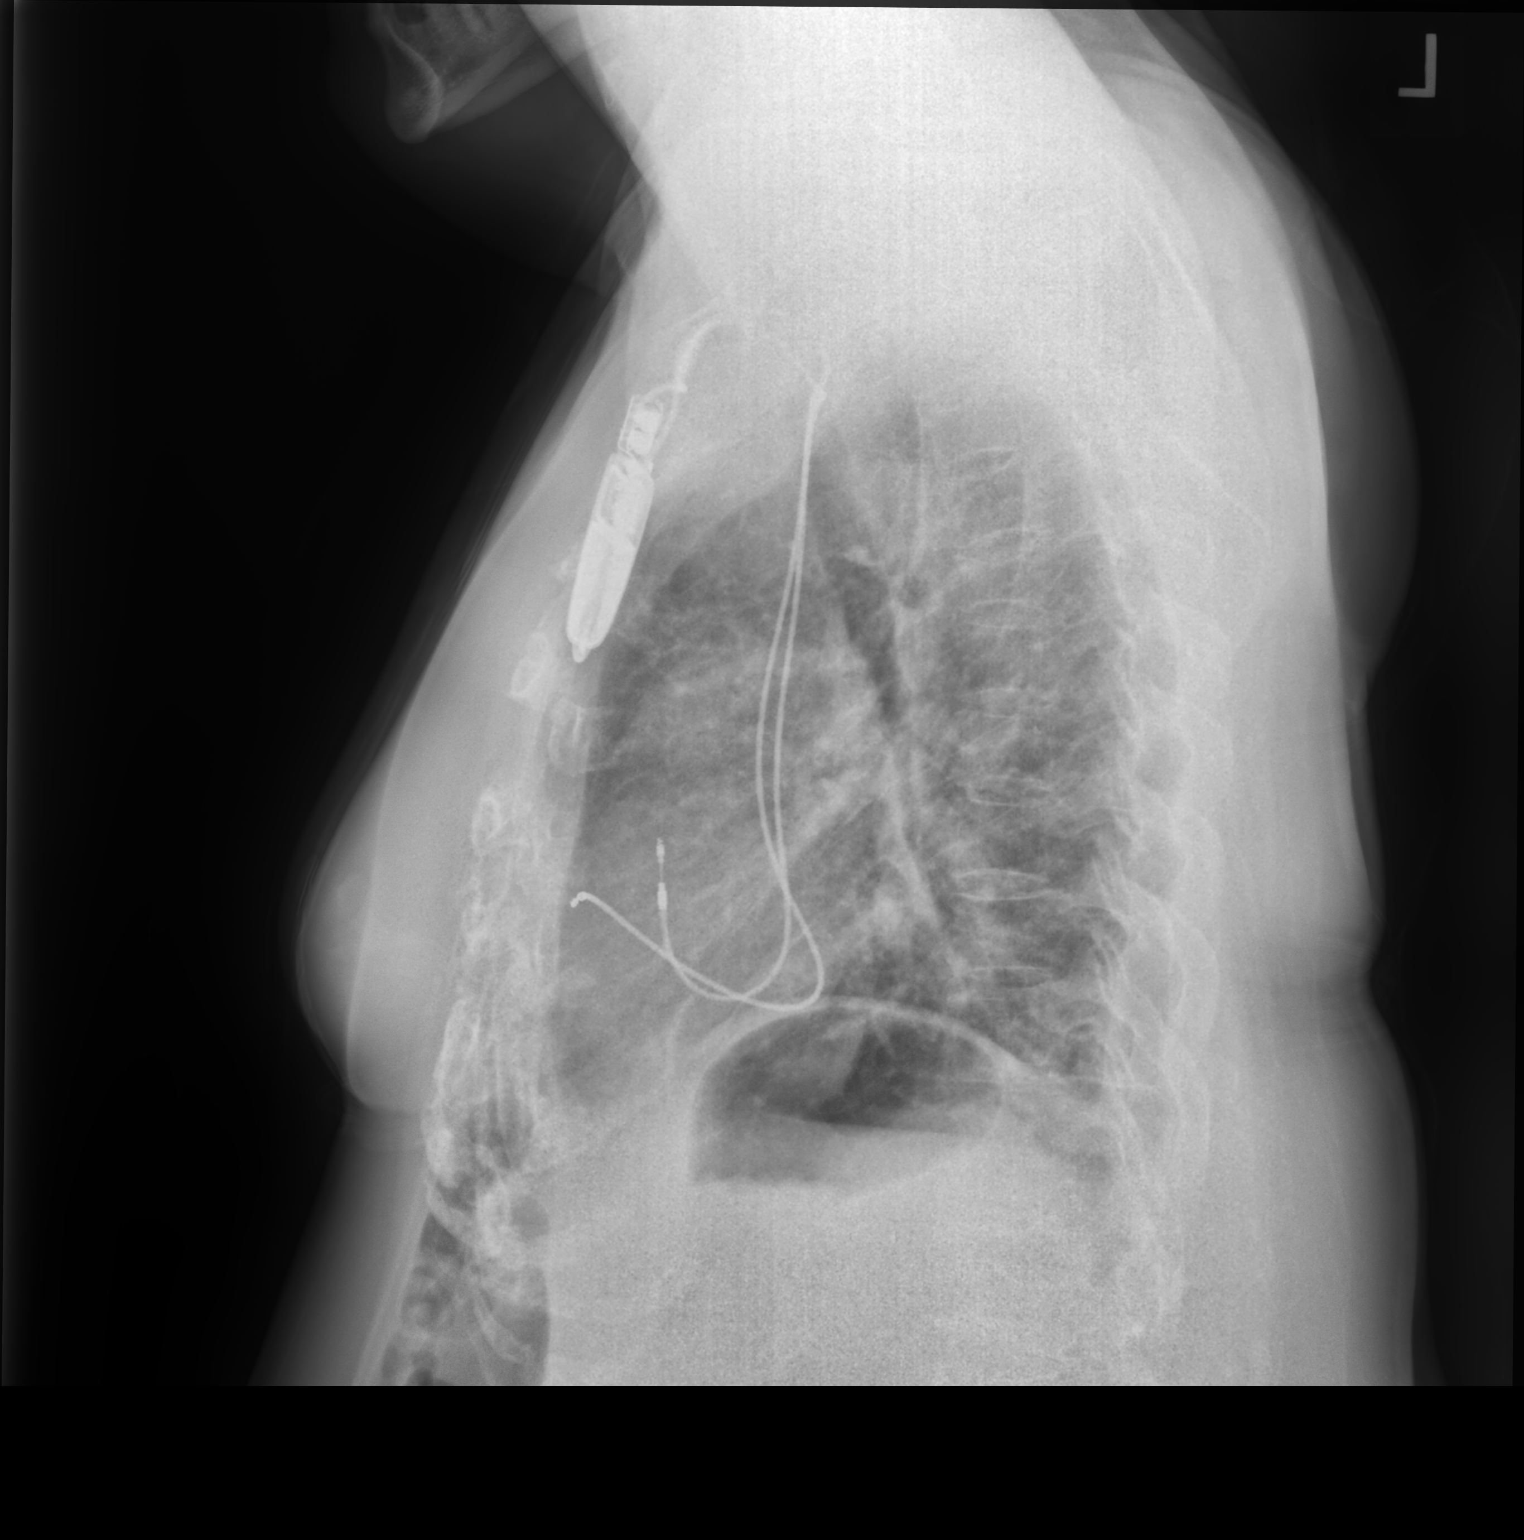

[2 of 2 positions shown; findings below may reference images not displayed]

FINDINGS: Heart is normal size. Right pacer remains in place, unchanged.
Chronic prominence of the central pulmonary arteries, stable
compatible with pulmonary arterial hypertension. Aortic
atherosclerosis. Increased interstitial markings in the lung bases,
right greater than left. This is stable. No acute airspace opacities
or effusions. No acute bony abnormality.
IMPRESSION: Stable chronic changes.  No active disease.

## 2020-12-17 NOTE — Telephone Encounter (Signed)
Transmission reviewed, no events. Normal device function on 12/16/20.

## 2020-12-17 NOTE — Telephone Encounter (Signed)
Transmission received 12/16/2020.

## 2020-12-18 ENCOUNTER — Telehealth: Payer: Self-pay

## 2020-12-18 NOTE — Telephone Encounter (Signed)
Attempted to contact patient's sister [name] to schedule a Palliative Care consult appointment. No answer left a message to return call.

## 2020-12-18 NOTE — Telephone Encounter (Signed)
Attempted to contact patient's sister [name] to schedule a Palliative Care consult appointment. No answer left a message to return call.  

## 2020-12-20 NOTE — Telephone Encounter (Signed)
Manual transmission received 12/16/20 at 8:00pm per device clinic request, to assess for possible episodes related to syncopal event 11/25/20. There are no episodes noted on transmission. Unsuccessful telephone call to patient/[name] (sister and POA) to discuss. Hip[employer] compliant VM message left requesting call back to [phone].

## 2020-12-20 NOTE — Telephone Encounter (Signed)
Incoming call from [name] (sister and POA of patient) as requested. Informed Ms. Kieth Brightly that there were not episodes noted since October 2020. Ms. Kieth Brightly states patient has not had another syncopal event since 11/25/20 and appreciative of follow up from device clinic. All questions and concerns answered.

## 2020-12-23 ENCOUNTER — Telehealth: Payer: Self-pay

## 2020-12-23 NOTE — Telephone Encounter (Signed)
Spoke with patient's sister [name] and scheduled an in-person Palliative Consult for 01/15/21 @ 9:30AM  COVID screening was negative. No pets in home. Patient lives with sister and brother in law.  Consent obtained; updated Outlook/Netsmart/Team List and Epic.   Family is aware they may be receiving a call from NP the day before or day of to confirm appointment.

## 2020-12-30 NOTE — Telephone Encounter (Signed)
JE please advise. Thanks  This is Kaitlyn Powell, [name]'s sister, and [name] seems to be having some symptoms of SOB, Hypoxemia, her Sats are running around 86-92 on room air. [name], as you know is a Cardiac nurse, and he heard some crackles in the lung bases, and this has been going on for about a week. So we were wondering if she should need a chest x-ray or Augmentin like she did last fall.. Your thoughts.

## 2021-01-01 ENCOUNTER — Telehealth: Payer: Self-pay | Admitting: Pulmonary Disease

## 2021-01-01 ENCOUNTER — Ambulatory Visit: Payer: Medicare Other

## 2021-01-01 DIAGNOSIS — R0602 Shortness of breath: Secondary | ICD-10-CM

## 2021-01-01 NOTE — Telephone Encounter (Signed)
Spoke to patient's sister, [name](DPR). [name] stated that patient has not been tested for covid. Patient is not experiencing any loss of smell/taste, fever, chills, sweats, diarrhea or vomiting. CXR has been ordered. OV scheduled for 01/02/2021 at 10:00. [name] is aware that CXR is needed prior to OV.  Nothing further needed at this time.   Routing to Dr. Everardo All as an Lorain Childes.

## 2021-01-01 NOTE — Telephone Encounter (Signed)
Called and spoke with Kaitlyn Powell (DPR).  Kaitlyn Powell was calling to follow up on my chart message sent 12/30/20. Kaitlyn Powell stated Patient is still having sob, abdomin working hard to breath ("belly breathing"), and [name]'s husband stated crackles in bases of lungs. Kaitlyn Powell stated Patient sats were low this morning and Patient was placed on 3 L oxygen. Patient sats were now 93-94% with O2.  Kaitlyn Powell stated Patient only uses O2 if needed and on bipap at night. Kaitlyn Powell stated they could bring Patient in for OV and or cxr this week, if that is what Dr. Everardo All recommends.  Kaitlyn Powell stated it was just hard getting Patient out of the house and to the office, but if needed she can make it happen.  Message routed to Dr. Everardo All to advise   My chart message - 12/30/20   This is Kaitlyn Powell, [name] sister, and Kaitlyn Powell seems to be having some symptoms of SOB, Hypoxemia, her Sats are running around 86-92 on room air. Kaitlyn Powell, as you know is a Cardiac nurse, and he heard some crackles in the lung bases, and this has been going on for about a week. So we were wondering if she should need a chest x-ray or Augmentin like she did last fall.. Your thoughts.

## 2021-01-01 NOTE — Telephone Encounter (Signed)
Please schedule patient with me at 10 am tomorrow with CXR prior to appointment.  If patient's oxygen needs worsen or she develop respiratory distress, she has been advised to present to the ED for evaluation

## 2021-01-02 ENCOUNTER — Ambulatory Visit (INDEPENDENT_AMBULATORY_CARE_PROVIDER_SITE_OTHER): Payer: Medicare Other | Admitting: Pulmonary Disease

## 2021-01-02 ENCOUNTER — Other Ambulatory Visit: Payer: Self-pay

## 2021-01-02 ENCOUNTER — Encounter: Payer: Self-pay | Admitting: Pulmonary Disease

## 2021-01-02 ENCOUNTER — Ambulatory Visit (INDEPENDENT_AMBULATORY_CARE_PROVIDER_SITE_OTHER): Payer: Medicare Other

## 2021-01-02 VITALS — BP 116/80 | HR 70 | Temp 98.4°F

## 2021-01-02 DIAGNOSIS — J159 Unspecified bacterial pneumonia: Secondary | ICD-10-CM | POA: Diagnosis not present

## 2021-01-02 DIAGNOSIS — Z7189 Other specified counseling: Secondary | ICD-10-CM

## 2021-01-02 DIAGNOSIS — J9601 Acute respiratory failure with hypoxia: Secondary | ICD-10-CM

## 2021-01-02 DIAGNOSIS — R0602 Shortness of breath: Secondary | ICD-10-CM | POA: Diagnosis not present

## 2021-01-02 MED ORDER — ARFORMOTEROL TARTRATE 15 MCG/2ML IN NEBU: 15.0000 ug | INHALATION_SOLUTION | Freq: Two times a day (BID) | RESPIRATORY_TRACT | 6 refills | Status: DC

## 2021-01-02 MED ORDER — LEVOFLOXACIN 750 MG PO TABS: 750.0000 mg | ORAL_TABLET | Freq: Every day | ORAL | 0 refills | Status: DC

## 2021-01-02 MED ORDER — FUROSEMIDE 20 MG PO TABS: 20.0000 mg | ORAL_TABLET | Freq: Every day | ORAL | 0 refills | Status: DC

## 2021-01-02 NOTE — Progress Notes (Signed)
Subjective:   PATIENT ID: Kaitlyn Powell GENDER: female DOB: 12/17/1955, MRN: [id number]   HPI  Chief Complaint  Patient presents with   Follow-up    Increased SOB and wheezing.     Reason for Visit: Follow-up  [name] is a 65 year old female with Down syndrome, Parkinson's disease, epilepsy and OSA on BiPAP who presents for follow-up.  Patient is unable to participate in visit. Her sister and caregiver Kaitlyn Powell is present for history.  Since our last visit she has become less ambulatory and currently unable to bear weight or walk. Family has requested Palliative consult and has an appointment scheduled for 6/15. Our clinic was contacted regarding worsening shortness of breath and hypoxemia on room air. She is compliant with her nocturnal oxygen of 3L via [place] at home however the hypoxemia during the daytime is new and she has developed crackles per her caregivers. She on Pulmicort and Duonebs. Secretions has been minimal. She is less alert and breathing hard. Her oxygen during the day has been < 88% and will improve on 2L to 95%.   I have personally reviewed patient's past medical/family/social history/allergies/current medications.  Past Medical History:  Diagnosis Date   Arthritis    Aspiration pneumonia (HCC)    Asthma    Cardiac arrhythmia    have pacemeaker   Down syndrome    DVT of popliteal vein (HCC) 09/08/2018   chronic   GERD (gastroesophageal reflux disease)    Heart block    Hypothyroidism    Mental retardation    Pacemaker    Parkinson's disease (HCC) 05/2019   Pulmonary emboli (HCC)    Seizures (HCC)    Sleep apnea with use of continuous positive airway pressure (CPAP)      Allergies  Allergen Reactions   Cephalexin Other (See Comments)    Seizures    Namenda [Memantine Hcl] Other (See Comments)    Made memory issues worse, sleepiness, increased confusion     Outpatient Medications Prior to Visit  Medication Sig Dispense Refill   albuterol  (PROVENTIL) (2.5 MG/3ML) 0.083% nebulizer solution Take 2.5 mg by nebulization every 6 (six) hours as needed for wheezing or shortness of breath.     budesonide (PULMICORT) 0.5 MG/2ML nebulizer solution INHALE 1 VIAL BY NEBULIZATION IN THE MORNING AND AT BEDTIME. 120 mL 11   cannabidiol (EPIDIOLEX) 100 MG/ML solution GIVE 3.7 ML BY MOUTH 2 TIMES A DAY. REFILLS CALL: [phone]. 222 mL 5   carbidopa-levodopa (SINEMET IR) 25-100 MG tablet Place 2 tablets into feeding tube 3 (three) times daily. Take 2 tablet three times a day with meals 540 tablet 3   cyclobenzaprine (FLEXERIL) 5 MG tablet Take 1/2 to 1 tablet at night as needed for muscle spasms 30 tablet 4   diclofenac Sodium (VOLTAREN) 1 % GEL Apply 2 g topically 4 (four) times daily. To affected joint. 350 g 6   Elastic Bandages & Supports (GAIT/TRANSFER BELT) MISC 1 Product by Does not apply route daily. 1 each 3   esomeprazole (NEXIUM) 10 MG packet Take 10 mg by mouth daily before breakfast. 30 each 2   folic acid (FOLVITE) 1 MG tablet Take 1 mg by mouth daily.     Incontinence Supplies (EXTENSION TUBING/CONNECTOR) MISC Use with PEG feedings change every 5 days Mic-key extension 6 each 11   ipratropium-albuterol (DUONEB) 0.5-2.5 (3) MG/3ML SOLN Take 3 mLs by nebulization every 6 (six) hours as needed. 360 mL 12   levETIRAcetam (KEPPRA) 100  MG/ML solution Give 52mL (500mg ) in AM, 34mL (1000mg ) in PM 473 mL 11   levothyroxine (SYNTHROID) 150 MCG tablet Place 150 mcg into feeding tube daily before breakfast.     NONFORMULARY OR COMPOUNDED ITEM Antifungal solution: Terbinafine 3%, Fluconazole 2%, Tea Tree Oil 5%, Urea 10%, Ibuprofen 2% in DMSO suspension #9mL 1 each 3   Nutritional Supplements (FEEDING SUPPLEMENT, OSMOLITE 1.2 CAL,) LIQD 1 can of Osmolite 1.2 bolus feeding daily Disp 30 day supply refill 1 year 237 mL 11   OXYGEN Inhale 3 L into the lungs as directed. With CPAP     phenytoin (DILANTIN) 125 MG/5ML suspension Give 16mL (200mg ) twice a  day by PEG tube 480 mL 11   rivaroxaban (XARELTO) 20 MG TABS tablet Take 1 tablet (20 mg total) by mouth daily with supper. NEEDS APPT W DR. 31m FOR REFILLS 30 tablet 1   Sennosides (SENNA) 8.8 MG/5ML SYRP TAKE 10 MLS BY MOUTH DAILY AS NEEDED 237 mL 1   simvastatin (ZOCOR) 40 MG tablet Take 1 tablet (40 mg total) by mouth at bedtime. Please keep upcoming appt for future refills. 90 tablet 0   sodium chloride (MURO 128) 5 % ophthalmic ointment Place 1 application into both eyes at bedtime.     SYRINGE DISPOSABLE 60CC 60 ML MISC 1 syringe per feeding three times a day 90 each 12   vitamin B-12 (CYANOCOBALAMIN) 100 MCG tablet Place 100 mcg into feeding tube daily.      No facility-administered medications prior to visit.    Review of Systems  Constitutional:  Negative for chills, diaphoresis, fever, malaise/fatigue and weight loss.  HENT:  Negative for congestion.   Respiratory:  Positive for shortness of breath. Negative for cough, hemoptysis, sputum production and wheezing.   Cardiovascular:  Negative for chest pain, palpitations and leg swelling.  Neurological:  Positive for weakness.       Lethargic     Objective:   Vitals:   01/02/21 1036  BP: 116/80  Pulse: 70  Temp: 98.4 F (36.9 C)  TempSrc: Temporal  SpO2: 90%      Physical Exam: General: Chronically ill-appearing, Down syndrome features, no acute distress HENT: [place], AT Eyes: EOMI, no scleral icterus Respiratory: Clear to auscultation bilaterally.  No crackles, wheezing or rales Cardiovascular: RRR, -M/R/G, no JVD GI: BS+, soft, nontender, PEG in place Extremities:-Edema,-tenderness Neuro: Less engaged, intermittently alert Skin: Intact, no rashes or bruising  Data Reviewed:  Imaging: CT Chest 09/21/19 - Bibasilar consolidation with air bronchograms  PFT: None on file  Sleep Studies: PSG 09/10/15 - AHI 12.8. 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 Worsening cognitize and respiratory decline. Discussed goals of care.   Acute pulmonary hypoxemia secondary to community-acquired pneumonia +/- vascular congestion --START levaquin 750 mg daily for seven days --START lasix 20 mg daily for three days --CONTINUE Pulmicort nebulize twice a day --ADD Brovana nebulizer twice a day --CONTINUE Albuterol four times a day as needed --CONTINUE supplemental oxygen  Goals of Care --Scheduled for Palliative appointment  Aspiration/secretions --CONTINUE guaifenesin q4h as needed  Asthma  --CONTINUE Pulmicort nebulizer TWICE a day --CONTINUE Duonebs nebulizer TWICE a day   OSA on BiPAP --CONTINUE BiPAP nightly and when napping  Health Maintenance Immunization History  Administered Date(s) Administered   Influenza Inj Mdck Quad Pf  04/25/2019   Influenza Split 05/03/2016, 04/16/2017   Influenza, Quadrivalent, Recombinant, Inj, Pf 04/25/2019   Influenza, Seasonal, Injecte, Preservative Fre 05/03/2016, 04/16/2017, 05/31/2017   Influenza,inj,Quad PF,6+ Mos 04/22/2017, 05/02/2018, 05/15/2020   Influenza-Unspecified 05/03/2016, 05/03/2019   Janssen (J&J) SARS-COV-2 Vaccination 11/04/2019   PFIZER(Purple Top)SARS-COV-2 Vaccination 09/02/2020   Pneumococcal Polysaccharide-23 07/19/2017   Pneumococcal-Unspecified 07/19/2017   Tdap 11/01/2014   CT Lung Screen - not qualified  No orders of the defined types were placed in this encounter.  Meds ordered this encounter  Medications   levofloxacin (LEVAQUIN) 750 MG tablet    Sig: Take 1 tablet (750 mg total) by mouth daily.    Dispense:  7 tablet    Refill:  0   furosemide (LASIX) 20 MG tablet    Sig: Take 1 tablet (20 mg total) by mouth daily for 3 days.    Dispense:  30 tablet    Refill:  0   DISCONTD: arformoterol (BROVANA) 15 MCG/2ML NEBU    Sig: Take 2 mLs  (15 mcg total) by nebulization 2 (two) times daily.    Dispense:  120 mL    Refill:  6   arformoterol (BROVANA) 15 MCG/2ML NEBU    Sig: Take 2 mLs (15 mcg total) by nebulization 2 (two) times daily.    Dispense:  120 mL    Refill:  6    Dx: J45.50   I have spent a total time of 31-minutes on the day of the appointment reviewing prior documentation, coordinating care and discussing medical diagnosis and plan with the patient/family. Imaging, labs and tests included in this note have been reviewed and interpreted independently by me.   Return in about 3 months (around 04/04/2021).  Shyasia Funches Mechele Collin, MD [facility] 01/02/2021 8:39 AM  Office Number [phone]

## 2021-01-02 NOTE — Patient Instructions (Addendum)
Acute pulmonary hypoxemia secondary to community-acquired pneumonia +/- vascular congestion --START levaquin 750 mg daily for seven days --START lasix 20 mg daily for three days --CONTINUE Pulmicort nebulize twice a day --ADD Brovana nebulizer twice a day --CONTINUE Albuterol four times a day as needed  Goals of Care --Scheduled for Palliative appointment  Follow-up with me in 3 months or call for sooner as needed

## 2021-01-06 ENCOUNTER — Ambulatory Visit: Payer: Medicare Other | Admitting: Family Medicine

## 2021-01-13 ENCOUNTER — Other Ambulatory Visit: Payer: Self-pay | Admitting: Neurology

## 2021-01-13 ENCOUNTER — Other Ambulatory Visit: Payer: Self-pay | Admitting: Internal Medicine

## 2021-01-13 NOTE — Telephone Encounter (Signed)
Enough given till 02/18/2021

## 2021-01-15 ENCOUNTER — Other Ambulatory Visit: Payer: Self-pay

## 2021-01-15 ENCOUNTER — Other Ambulatory Visit: Payer: Medicare Other | Admitting: Nurse Practitioner

## 2021-01-15 VITALS — HR 68

## 2021-01-15 DIAGNOSIS — G20 Parkinson's disease: Secondary | ICD-10-CM

## 2021-01-15 DIAGNOSIS — R1310 Dysphagia, unspecified: Secondary | ICD-10-CM

## 2021-01-15 DIAGNOSIS — Z515 Encounter for palliative care: Secondary | ICD-10-CM

## 2021-01-15 DIAGNOSIS — G40909 Epilepsy, unspecified, not intractable, without status epilepticus: Secondary | ICD-10-CM

## 2021-01-15 NOTE — Progress Notes (Signed)
[place] Consult Note Telephone: [phone]  Fax: [phone]    Date of encounter: 01/15/21 PATIENT NAME: Kaitlyn Powell [id number]   [phone] (home)  DOB: May 14, 1956 MRN: [id number]  PRIMARY CARE PROVIDER:    Bernerd Limbo, MD,  [address] [place] [address] [phone]  REFERRING PROVIDER:   Bernerd Limbo, MD [address] [place],  [place] [address] [phone]  RESPONSIBLE PARTY:    Contact Information     Name Relation Home Work Mobile   [name] Sister [phone]     Zillich,George Relative   [phone]     I met face to face with patient in home. Her sister [name]) and her sister's husband [name] present during visit.  Palliative Care was asked to follow this patient by consultation request of  Bernerd Limbo, MD to address advance care planning and complex medical decision making. This is the initial visit.                                   ASSESSMENT AND PLAN / RECOMMENDATIONS:   Advance Care Planning/Goals of Care: Goals include to maximize quality of life and symptom management.  Visit consisted of building trust and discussions on Palliative care medicine as specialized medical care for people living with serious illness, aimed at facilitating improved quality of life through symptoms relief, assisting with advance care planning and establishing goals of care. Family expressed appreciation for education provided on Palliative care and how it differs from Hospice service. Our advance care planning conversation today included a discussion about:    The value and importance of advance care planning  Experiences with loved ones who have been seriously ill or have died  Exploration of personal, cultural or spiritual beliefs that might influence medical decisions  Exploration of goals of care in the event of a sudden injury or  illness  Review and updating or creation of an advance directive document . Decision not to resuscitate  CODE STATUS: DNR Goals of care: Patient's goal of care is function.  Directives: Patient's sister who is her POA decided that patient would not be resuscitated in the event of cardiac or respiratory arrest. DNR form signed for patient today. The need to complete a MOST form was discussed, patient's family expressed interest in completing a MOST form. Today. Discussed and reviewed sections of the form in detail, opportunity for questions given, all questions answered. Form completed and signed with patient's sister. Signed DNR and MOST forms given to sister to keep, copies uploaded to [facility] EMR. Details of MOST form include limited additional intervention, determine use or limitation of antibiotics, IV fluids for a defined trial period, feeding tube for trial period.   Symptom Management/Plan: Dysphagia: continue mechanical soft diet and honey thick liquids. Monitor for increased coughing or spluttering during meals as a sign of worsening aspiration, may consider all feeding via peg tube as indicated. Seizure disorder: no report of recent seizures. Family report last seizure was over a year ago. Continue current plan of care.  Parkinson's disease: condition progressing. Patient totally dependent on family for all her ADLs. Patient's sister [name] is her primary caregiver. [name]'s husband [name] is a Equities trader and assist in her care. Patient receives personal caregiver total of 8hrs a week. Family looking to increase caregiver hours, also nursing alternative plan of  placing patient in a long term care facility as patient's care is getting more demanding. Family asked for guidance on looking for a facility. Patient would be referred to palliative care social worker for assistance. Questions and concerns were addressed. Patient's family was encouraged to call with questions and/or concerns.  Provided general support and encouragement, no other unmet needs identified at this time.  Follow up Palliative Care Visit: Palliative care will continue to follow for complex medical decision making, advance care planning, and clarification of goals. Return in about about 5 weeks or prn.  PPS: 40%  HOSPICE ELIGIBILITY/DIAGNOSIS: TBD  Chief Complaint: initial palliative care visit  History obtained from review of Epic EMR and discussion with patient's family.   HISTORY OF PRESENT ILLNESS:  Kaitlyn Powell is a 65 y.o. year old female with  multiple medical problems including mental retardation related to down syndrome, parkinson's disease, epilepsy, OSA on BIPAP at night, presence of peg tube due to history of aspiration.  Family report ongoing decline in both function and cognition. Patient is now non-verbal and non ambulatory. Patient depends on her family for all her ADLs including feeding, she is two persons assist for all transfers. Family however report patient eats well when fed, has peg tube that is used as needed, meds given via peg tube. No weight loss reported by family, actually gained weight, family guessed weight to be 160lbs. Patient was treated for possible aspiration pneumonia on 01/02/2021, symptoms has since resolved. Patient does not appear acutely ill on exam today, no sign of acute distress noted. No report of fever, chills, or increased SOB. Family report occasional episode of grimacing with activity, report patient unable to verbalize pain due to poor cognition, report pain is managed with Tylenol.   I reviewed available labs, medications, imaging, studies and related documents from the EMR.  Records reviewed and summarized above.   ROS Unable to complete due to poor cognition.  Vitals:   01/15/21 1053  Pulse: 68  SpO2: 91%    Physical Exam: Current and past weights: no report of weight loss, no recent weight reported General: frail appearing, obese, NAD EYES:  anicteric sclera, no discharge  ENMT: oral mucous membranes moist CV: RRR, no LE edema Pulmonary: LCTA, no increased work of breathing, no cough, room air Abdomen: no ascites, peg tube intact GU: deferred MSK: sarcopenia, non-ambulatory Skin: warm and dry, no rashes or wounds on visible skin Neuro: generalized weakness,  severe cognitive impairment Psych: non-anxious affect, awake and alert Hem/lymph/immuno: no widespread bruising  CURRENT PROBLEM LIST:  Patient Active Problem List   Diagnosis Date Noted   Community acquired bacterial pneumonia 01/02/2021   Severe persistent asthma 05/30/2020   Left ankle pain 02/20/2020   Gastrostomy status ([place])    Moderate malnutrition ([place])    Acute hypoxemic respiratory failure ([place])    Dysphagia    Advanced care planning/counseling discussion    Goals of care, counseling/discussion    Palliative care by specialist    Cellulitis of left hand 04/04/2019   Left hand pain 04/04/2019   Heart block AV complete ([place]) 01/19/2019   Chronic deep vein thrombosis (DVT) of right popliteal vein ([place]) 09/28/2018   Localization-related (focal) (partial) symptomatic epilepsy and epileptic syndromes with complex partial seizures, intractable, with status epilepticus ([place]) 09/07/2018   Dehydration 08/01/2018   Functional urinary incontinence 07/22/2017   OSA treated with BiPAP 12/31/2016   Mixed conductive and sensorineural hearing loss of both ears 08/05/2016   Seizures ([place]) 07/03/2016  Bilateral hearing loss 07/03/2016   Down syndrome 05/10/2016   Anemia due to medication 05/10/2016   Acquired hypothyroidism 05/10/2016   Elevated lipase 05/10/2016   History of pulmonary embolism 05/10/2016   G tube feedings ([place]) 05/10/2016   Pacemaker 05/10/2016   S/P percutaneous endoscopic gastrostomy (PEG) tube placement ([place]) 05/10/2016   PAST MEDICAL HISTORY:  Active Ambulatory Problems    Diagnosis Date Noted   Down syndrome 05/10/2016   Seizures ([place])  07/03/2016   OSA treated with BiPAP 12/31/2016   Anemia due to medication 05/10/2016   Acquired hypothyroidism 05/10/2016   Bilateral hearing loss 07/03/2016   Elevated lipase 05/10/2016   History of pulmonary embolism 05/10/2016   Mixed conductive and sensorineural hearing loss of both ears 08/05/2016   G tube feedings ([place]) 05/10/2016   Functional urinary incontinence 07/22/2017   Pacemaker 05/10/2016   S/P percutaneous endoscopic gastrostomy (PEG) tube placement ([place]) 05/10/2016   Dehydration 08/01/2018   Localization-related (focal) (partial) symptomatic epilepsy and epileptic syndromes with complex partial seizures, intractable, with status epilepticus ([place]) 09/07/2018   Chronic deep vein thrombosis (DVT) of right popliteal vein ([place]) 09/28/2018   Heart block AV complete ([place]) 01/19/2019   Cellulitis of left hand 04/04/2019   Left hand pain 04/04/2019   Dysphagia    Advanced care planning/counseling discussion    Goals of care, counseling/discussion    Palliative care by specialist    Acute hypoxemic respiratory failure ([place])    Gastrostomy status ([place])    Moderate malnutrition ([place])    Left ankle pain 02/20/2020   Severe persistent asthma 05/30/2020   Community acquired bacterial pneumonia 01/02/2021   Resolved Ambulatory Problems    Diagnosis Date Noted   Aspiration pneumonia ([place]) 24/58/0998   Toxic metabolic encephalopathy 33/82/5053   Past Medical History:  Diagnosis Date   Arthritis    Asthma    Cardiac arrhythmia    DVT of popliteal vein ([place]) 09/08/2018   GERD (gastroesophageal reflux disease)    Heart block    Hypothyroidism    Mental retardation    Parkinson's disease ([place]) 05/2019   Pulmonary emboli (HCC)    Sleep apnea with use of continuous positive airway pressure (CPAP)    SOCIAL HX:  Social History   Tobacco Use   Smoking status: Never   Smokeless tobacco: Never  Substance Use Topics   Alcohol use: No   FAMILY HX:  Family History   Problem Relation Age of Onset   Diabetes Father    Heart disease Father    Breast cancer Sister      ALLERGIES:  Allergies  Allergen Reactions   Cephalexin Other (See Comments)    Seizures    Namenda [Memantine Hcl] Other (See Comments)    Made memory issues worse, sleepiness, increased confusion     PERTINENT MEDICATIONS:  Outpatient Encounter Medications as of 01/15/2021  Medication Sig   albuterol (PROVENTIL) (2.5 MG/3ML) 0.083% nebulizer solution Take 2.5 mg by nebulization every 6 (six) hours as needed for wheezing or shortness of breath.   arformoterol (BROVANA) 15 MCG/2ML NEBU Take 2 mLs (15 mcg total) by nebulization 2 (two) times daily.   budesonide (PULMICORT) 0.5 MG/2ML nebulizer solution INHALE 1 VIAL BY NEBULIZATION IN THE MORNING AND AT BEDTIME.   cannabidiol (EPIDIOLEX) 100 MG/ML solution GIVE 3.7 ML BY MOUTH 2 TIMES A DAY. REFILLS CALL: [phone].   carbidopa-levodopa (SINEMET IR) 25-100 MG tablet PLACE 2 TABLETS INTO FEEDING TUBE 3 (THREE) TIMES DAILY. TAKE 2 TABLET THREE TIMES  A DAY WITH MEALS   cyclobenzaprine (FLEXERIL) 5 MG tablet Take 1/2 to 1 tablet at night as needed for muscle spasms   diclofenac Sodium (VOLTAREN) 1 % GEL Apply 2 g topically 4 (four) times daily. To affected joint.   folic acid (FOLVITE) 1 MG tablet Take 1 mg by mouth daily.   furosemide (LASIX) 20 MG tablet Take 1 tablet (20 mg total) by mouth daily for 3 days.   Incontinence Supplies (EXTENSION TUBING/CONNECTOR) MISC Use with PEG feedings change every 5 days Mic-key extension   levETIRAcetam (KEPPRA) 100 MG/ML solution Give 58m (5061m in AM, 1044m1000m41mn PM   levofloxacin (LEVAQUIN) 750 MG tablet Take 1 tablet (750 mg total) by mouth daily.   levothyroxine (SYNTHROID) 150 MCG tablet Place 150 mcg into feeding tube daily before breakfast.   NONFORMULARY OR COMPOUNDED ITEM Antifungal solution: Terbinafine 3%, Fluconazole 2%, Tea Tree Oil 5%, Urea 10%, Ibuprofen 2% in DMSO suspension  #30mL60mutritional Supplements (FEEDING SUPPLEMENT, OSMOLITE 1.2 CAL,) LIQD 1 can of Osmolite 1.2 bolus feeding daily Disp 30 day supply refill 1 year   OXYGEN Inhale 3 L into the lungs as directed. With CPAP   phenytoin (DILANTIN) 125 MG/5ML suspension Give 8mL (34mmg) 47me a day by PEG tube   rivaroxaban (XARELTO) 20 MG TABS tablet Take 1 tablet (20 mg total) by mouth daily with supper. NEEDS APPT W DR. ALLRED Rayann HemanFILLS   Sennosides (SENNA) 8.8 MG/5ML SYRP TAKE 10 MLS BY MOUTH DAILY AS NEEDED   simvastatin (ZOCOR) 40 MG tablet TAKE 1 TABLET (40 MG TOTAL) BY MOUTH AT BEDTIME. PLEASE KEEP UPCOMING APPT FOR FUTURE REFILLS.   sodium chloride (MURO 128) 5 % ophthalmic ointment Place 1 application into both eyes at bedtime.   SYRINGE DISPOSABLE 60CC 60 ML MISC 1 syringe per feeding three times a day   vitamin B-12 (CYANOCOBALAMIN) 100 MCG tablet Place 100 mcg into feeding tube daily.    [DISCONTINUED] Omeprazole Magnesium (PRILOSEC) 10 MG PACK 20 mg by PEG Tube route daily before breakfast.   No facility-administered encounter medications on file as of 01/15/2021.    I spent 80 minutes providing this consultation. More than 50% of the time in this consultation was spent in counseling and care coordination.  Thank you for the opportunity to participate in the care of Ms. Cesaro.  The palliative care team will continue to follow. Please call our office at [phone] be of additional assistance.   QueenetJari FavreAGPCNP-BC  COVID-19 PATIENT SCREENING TOOL Asked and negative response unless otherwise noted:   Have you had symptoms of covid, tested positive or been in contact with someone with symptoms/positive test in the past 5-10 days?

## 2021-01-20 ENCOUNTER — Ambulatory Visit: Payer: Medicare Other | Admitting: Family Medicine

## 2021-01-21 ENCOUNTER — Ambulatory Visit: Payer: Medicare Other | Admitting: Family Medicine

## 2021-01-22 ENCOUNTER — Ambulatory Visit: Payer: Medicare Other | Admitting: Family Medicine

## 2021-01-24 ENCOUNTER — Telehealth: Payer: Self-pay

## 2021-01-24 NOTE — Telephone Encounter (Signed)
(  10:51 am) SW completed follow-up call to patient's sister -Kaitlyn Powell to provided education and resources regarding LTC placement and PCS services. Kaitlyn Powell advised that she applied for [facility] services for patient  and is scheduled to have a face-to-face visit on 7/5. However, she would like to have a plan B, as she feels that patient may soon need placement. SW provided education to her regarding the LTC facilities in the area that she could consider, as well as the the documents that are needed ([place] 2, med list) to pursue placement. Patient has medicaid and is totally dependent for all ADL's. Her sister is having some health issues as well and is unable to care for patient without assistance.  Kaitlyn Powell requested that SW mail her some information on SNF facilities so that she may research them.   SW to follow-up.

## 2021-01-24 NOTE — Telephone Encounter (Signed)
Spoke with Kaitlyn Powell. The patient has been following the treatment plan. No constipation. Using glycerin suppositories, moistened wipes and gentle rectal care. She continues to have bleeding hemorrhoids. [name] can see them and can see the redness of them with the bleeding. She feels it is time to do something about them. Referral placed to [facility]. [name] is aware [facility] will contact her directly to schedule and the first appointment is a consult only.

## 2021-01-27 NOTE — Telephone Encounter (Signed)
Ok, thank you

## 2021-02-05 ENCOUNTER — Other Ambulatory Visit: Payer: Self-pay | Admitting: Neurology

## 2021-02-05 ENCOUNTER — Other Ambulatory Visit: Payer: Self-pay | Admitting: Internal Medicine

## 2021-02-05 NOTE — Telephone Encounter (Addendum)
Xarelto 20mg  refill request received. Pt is 65 years old, weight-74.8kg, Crea-1.13 on 05/09/2020, last seen by Dr. 07/09/2020 on 12/13/20 via Telemedicine visit, Diagnosis-DVT (2020) & PE (2017), CrCl-59.13ml/min. Will need to see if dose reduction appropriate or not since a DVT & PE. Message to Dr. 25m.   Spoke with Johney Frame, PharmD and states since multiple events send in requested Xarelto and to avoid missed doses.

## 2021-02-06 ENCOUNTER — Ambulatory Visit (INDEPENDENT_AMBULATORY_CARE_PROVIDER_SITE_OTHER): Payer: Medicare Other

## 2021-02-06 ENCOUNTER — Encounter: Payer: Self-pay | Admitting: Family Medicine

## 2021-02-06 ENCOUNTER — Other Ambulatory Visit: Payer: Self-pay | Admitting: Family Medicine

## 2021-02-06 ENCOUNTER — Ambulatory Visit (INDEPENDENT_AMBULATORY_CARE_PROVIDER_SITE_OTHER): Payer: Medicare Other | Admitting: Family Medicine

## 2021-02-06 ENCOUNTER — Ambulatory Visit: Payer: Self-pay

## 2021-02-06 ENCOUNTER — Other Ambulatory Visit: Payer: Self-pay

## 2021-02-06 VITALS — Ht <= 58 in

## 2021-02-06 DIAGNOSIS — M25562 Pain in left knee: Secondary | ICD-10-CM | POA: Diagnosis not present

## 2021-02-06 DIAGNOSIS — M545 Low back pain, unspecified: Secondary | ICD-10-CM

## 2021-02-06 DIAGNOSIS — M25561 Pain in right knee: Secondary | ICD-10-CM

## 2021-02-06 NOTE — Patient Instructions (Addendum)
Thank you for coming in today.   Please get an Xray today before you leave   Schedule the tylenol and the topical diclofenac.   Recheck in 1 month.   Can do injections if needed.   If we need to move to opiates I would like to get palliative care help on this.

## 2021-02-06 NOTE — Progress Notes (Signed)
I, Kaitlyn Powell, LAT, ATC acting as a scribe for Kaitlyn Graham, MD.  Kaitlyn Powell is a 65 y.o. female who presents to [facility] today for back and knee pain.  She was last seen by Dr. Katrinka Powell on 02/20/20 for L ankle pain.  Pt is non-verbal and has a dx of Parkinson's. Pt is cared for my her sister, Kaitlyn Powell, and Kaitlyn Powell's husband. Pt's sister is unsure if it's the R or L knee, but reports that sometimes pt will reach for knee and make an "ouch" sound. The caregivers speculate that the pt's back hurts her as well due to pt grabbing her back . They note dx of compression fxs.   Treatments tried: Diclofenac gel, Tylenol, compression, massage lightly  Pertinent review of systems: No fevers or chills  Relevant historical information: Down syndrome developing dementia and Parkinson's disease.  Nonverbal with swallowing difficulty requiring PEG tube feedings. Patient is mostly wheelchair confined at this point with requiring's assistance to stand to transition.  Exam:  Ht 4\' 10"  (1.473 m)   BMI 34.49 kg/m  General: Well Developed, well nourished, and in no acute distress.   MSK: T-spine normal-appearing does not appear to be exquisitely tender to the midline L-spine normal-appearing does not appear to be exquisitely tender to midline.  Left knee mature scar anterior knee.  Decreased range of motion..  Right knee intact range of motion.    Lab and Radiology Results  X-ray images T-spine, L-spine, bilateral knees obtained today personally and independently interpreted.  T-spine: Compression fracture deformity mid thoracic spine visible per my interpretation  L-spine: Degenerative changes throughout lower portion of L-spine.  Compression fracture changes visible at L1 and L2  Left knee: Intact intramedullary nail tibia.  Degenerative changes patellofemoral joint.  Decreased bone mineral density.  No acute fractures.  Right knee: Mild medial compartment DJD.   Moderate patellofemoral DJD.  No acute fractures.  Diagnostic Limited MSK Ultrasound of: Bilateral knees Minimal joint effusion.  Mild degenerative changes both knees.  No large tendon disruptions. Impression: Mild degenerative changes medial compartment both knees.   Assessment and Plan: 65 y.o. female with apparent pain about the spine and bilateral knees with standing.  This is difficult to tell because as Kaitlyn Powell has developed dementia and Parkinson's disease she has become less verbal and is unable to tell Kaitlyn Powell where she hurts and how she hurts so her caregivers are having to infer the location of her pain.  She is not standing normally anymore and standing less and transitioning less and seems to be grabbing her back and not extending her knees normally.  Based on x-rays is likely that she does have compression fractures in her thoracic and lumbar spine that probably are contributing to her pain and does have degenerative changes in lumbar spine that may be a contributing factor as well.  Additionally she probably does have degenerative changes in both these that are contributing to pain as well.  Discussed options plan on maximizing Tylenol and await radiology overread.  May need CT scan to further characterize compression fractures of the thoracic and lumbar spine however the obvious intervention here would be kyphoplasty or vertebroplasty.  She would need to stop her anticoagulation for this and this would obviously be a somewhat traumatic intervention for her but would probably increase the quality of life.  Addition for knees if not able to manage pain better with regularly scheduled Tylenol and diclofenac gel could proceed with steroid injections.  On a more global since Kaitlyn Powell is fading and somewhat approaching the end of her life.  Palliative care is sort of involved and I did spend some time talking about goals of care.  For now reasonable to continue current level of care.   PDMP not  reviewed this encounter. Orders Placed This Encounter  Procedures   DG Lumbar Spine 2-3 Views    Standing Status:   Future    Number of Occurrences:   1    Standing Expiration Date:   02/06/2022    Order Specific Question:   Reason for Exam (SYMPTOM  OR DIAGNOSIS REQUIRED)    Answer:   back pain    Order Specific Question:   Preferred imaging location?    Answer:   [facility]   [place] LIMITED JOINT SPACE STRUCTURES LOW RIGHT(NO LINKED CHARGES)    Standing Status:   Future    Number of Occurrences:   1    Standing Expiration Date:   08/09/2021    Order Specific Question:   Reason for Exam (SYMPTOM  OR DIAGNOSIS REQUIRED)    Answer:   right knee pain    Order Specific Question:   Preferred imaging location?    Answer:   [facility] Knee Complete 4 Views Right    Standing Status:   Future    Number of Occurrences:   1    Standing Expiration Date:   02/06/2022    Order Specific Question:   Reason for Exam (SYMPTOM  OR DIAGNOSIS REQUIRED)    Answer:   right knee pain    Order Specific Question:   Preferred imaging location?    Answer:   Kaitlyn Powell   DG Knee Complete 4 Views Left    Standing Status:   Future    Number of Occurrences:   1    Standing Expiration Date:   03/09/2021    Order Specific Question:   Reason for Exam (SYMPTOM  OR DIAGNOSIS REQUIRED)    Answer:   L knee pain    Order Specific Question:   Preferred imaging location?    Answer:   Kaitlyn Powell   No orders of the defined types were placed in this encounter.    Discussed warning signs or symptoms. Please see discharge instructions. Patient expresses understanding.   The above documentation has been reviewed and is accurate and complete Kaitlyn Powell, M.D.

## 2021-02-09 ENCOUNTER — Telehealth: Payer: Self-pay | Admitting: Internal Medicine

## 2021-02-09 NOTE — Telephone Encounter (Signed)
Call from sister 02/09/2021 10:11 AM    [name] [address] [place] [address] Jan 30, 1956 dob     has a past medical history of Arthritis, Aspiration pneumonia (HCC), Asthma, Cardiac arrhythmia, Down syndrome, DVT of popliteal vein (HCC) (09/08/2018), GERD (gastroesophageal reflux disease), Heart block, Hypothyroidism, Mental retardation, Pacemaker, Parkinson's disease (HCC) (05/2019), Pulmonary emboli (HCC), Seizures (HCC), and Sleep apnea with use of continuous positive airway pressure (CPAP).    has a past surgical history that includes Pacemaker insertion (12/09/2015); PEG tube placement; Tibia fracture surgery (Right); IR GASTROSTOMY TUBE MOD SED (10/04/2019); IR Replc Gastro/Colonic Tube Percut W/Fluoro (12/14/2019); IR Replc Gastro/Colonic Tube Percut W/Fluoro (01/04/2020); IR Radiologist Eval & Mgmt (02/07/2020); IR Radiologist Eval & Mgmt (02/28/2020); and IR Replc Gastro/Colonic Tube Percut W/Fluoro (03/19/2020).  Allergies  Allergen Reactions   Cephalexin Other (See Comments)    Seizures    Namenda [Memantine Hcl] Other (See Comments)    Made memory issues worse, sleepiness, increased confusion   Allergies  Allergen Reactions   Cephalexin Other (See Comments)    Seizures    Namenda [Memantine Hcl] Other (See Comments)    Made memory issues worse, sleepiness, increased confusion     Pneumonia a month ago Baseline - does NOT verbalize other than perioids of lucidity - talked yesterday Now last 2 days lot of sputum - despite nebs Pulse ox 95% - is on BiPAP. HR 72 On 3L at night with BiPAP QHS Now on BiPAP  but now in day due to drop pulse ox x 2 days No fever Alertrness - sleepy but overall demeanor "good".  - Overall baseline alertness    Family NOT interested or desirous of ER visit Wanting to avoid ER at all costs Wants to try Abx at home and if gets wrose then go to ER - > they are willing to take this risk.  Plan START levaquin 750 mg daily for seven  days - [facility] [address] [facility] (theyw ant same from month ago) ER if worse   Phone: [phone] - [facility] [address] - called 02/09/2021 10:11 AM     SIGNATURE    Dr. Kalman Shan, M.D., F.C.C.P,  Pulmonary and Critical Care Medicine Staff Physician, [facility] Director - Interstitial Lung Disease  Program  Pulmonary Fibrosis [facility] at [facility] [place], [place], [address]  NPI Number:  NPI #[id number]  Pager: [phone], If no answer  -> Check AMION or Try [phone] Telephone (clinical office): [phone] Telephone (research): [phone]  10:14 AM 02/09/2021

## 2021-02-10 ENCOUNTER — Telehealth: Payer: Self-pay | Admitting: Pulmonary Disease

## 2021-02-10 NOTE — Telephone Encounter (Signed)
Called and spoke with patient's sister [name]. She stated that the patient begin to have extreme fatigue and a heavy cough last Friday. They increased her albuterol neb to every 4 hours, Mucinex every 4 hours in hopes that these would work. They did not. They called the on-call doctor yesterday and he prescribed Levaquin 750mg  once daily for the next 7 days. So far she has responded well to the Levaquin. At the time of the call, the patient was sitting at the kitchen table eating breakfast. The cough has seemed to calm down but she still has the cough. She does not seem to be in any distress today.   She described the cough has productive with thick, clear phlegm. Denied any fevers, chills or runny nose.   She wanted to let Dr. know and to see if she had any additional recommendations for her.   Dr. Everardo All, please advise. Thanks!

## 2021-02-10 NOTE — Progress Notes (Signed)
Bilateral knee arthritis is present. No fractures seen.

## 2021-02-10 NOTE — Progress Notes (Signed)
Chronic compression fracture seen at T6 appears stable.

## 2021-02-10 NOTE — Telephone Encounter (Signed)
Called Kaitlyn Powell back but she did not answer. Left message for her to call back.

## 2021-02-10 NOTE — Progress Notes (Signed)
BL knee arthritis is present without fracture.

## 2021-02-10 NOTE — Telephone Encounter (Signed)
Thank you for keeping me updated. I agree with the levaquin. I would continue with the albuterol nebulizer every 4-6 hours for another two days to help open her up while the antibiotic works. Otherwise no additional recommendations.

## 2021-02-10 NOTE — Telephone Encounter (Signed)
Called and spoke with [facility]. She verbalized understanding.   Nothing further needed at time of call.

## 2021-02-10 NOTE — Progress Notes (Signed)
Chronic compression fractures seen at T12, L1 and L2.. Arthritis  is present in the lower part of the lumbar spine.

## 2021-02-10 NOTE — Progress Notes (Signed)
BL knee arthritis is present without fracture.  

## 2021-02-11 DIAGNOSIS — F0280 Dementia in other diseases classified elsewhere without behavioral disturbance: Secondary | ICD-10-CM | POA: Insufficient documentation

## 2021-02-11 DIAGNOSIS — Q909 Down syndrome, unspecified: Secondary | ICD-10-CM | POA: Insufficient documentation

## 2021-02-12 ENCOUNTER — Ambulatory Visit (INDEPENDENT_AMBULATORY_CARE_PROVIDER_SITE_OTHER): Payer: Medicare Other

## 2021-02-12 DIAGNOSIS — I442 Atrioventricular block, complete: Secondary | ICD-10-CM

## 2021-02-12 LAB — CUP PACEART REMOTE DEVICE CHECK
Battery Remaining Longevity: 126 mo
Battery Remaining Percentage: 100 %
Brady Statistic RA Percent Paced: 19 %
Brady Statistic RV Percent Paced: 14 %
Date Time Interrogation Session: 20220713022100
Implantable Lead Implant Date: 20170508
Implantable Lead Implant Date: 20170508
Implantable Lead Location: 753859
Implantable Lead Location: 753860
Implantable Lead Model: 7740
Implantable Lead Model: 7741
Implantable Lead Serial Number: 1111
Implantable Lead Serial Number: 1111
Implantable Pulse Generator Implant Date: 20170508
Lead Channel Impedance Value: 632 Ohm
Lead Channel Impedance Value: 684 Ohm
Lead Channel Pacing Threshold Amplitude: 0.8 V
Lead Channel Pacing Threshold Pulse Width: 0.4 ms
Lead Channel Setting Pacing Amplitude: 2 V
Lead Channel Setting Pacing Amplitude: 5 V
Lead Channel Setting Pacing Pulse Width: 1 ms
Lead Channel Setting Sensing Sensitivity: 2.5 mV
Pulse Gen Serial Number: 747619

## 2021-02-14 ENCOUNTER — Telehealth: Payer: Self-pay | Admitting: Pulmonary Disease

## 2021-02-14 MED ORDER — LEVOFLOXACIN 750 MG PO TABS: 750.0000 mg | ORAL_TABLET | Freq: Every day | ORAL | 0 refills | Status: DC

## 2021-02-14 NOTE — Telephone Encounter (Signed)
Called and spoke with [name] (patient's sister). She stated that Jacie still has a productive cough with thick, clear phlegm. She has not seen any color in the phlegm but her O2 levels have started to drop. At the beginning of the week, her O2 levels were in the mid to upper 90s. Yesterday they started dropping to the low 90s and high 80s. She normally sleeps with her bipap at night and doesn't use O2 during the day. They have keeping her on 3L of O2 during the day.   She is still taking the Mucinex every 6 hours as well as the albuterol.   She will finish the Levaquin tomorrow and is concerned with it being the weekend that she is not improving. She wants to know if she should extend the Levaquin.   She denied any fevers.   Pharmacy is [facility] on [address].   Dr. Everardo All, we spoke in the secure chat about extending the Levaquin but I wanted to let you know [name] called back with more information. Can you please advise?

## 2021-02-14 NOTE — Telephone Encounter (Signed)
Called and spoke with [name] [facility]), advised of recommendations per Dr. Everardo All.  Patient is on 3L of oxygen and sats are currently 97%.  They did hear crackles in her RLL last night.  No open appointments with NP or Dr. Everardo All next week.  Dr. Everardo All is only in the office on Monday and Tuesday of next week.  Advised to call us early on Monday morning if she is not improving and needs to be seen so we can contact Dr. Everardo All for a work in appointment. She verbalized understanding.  Will leave message open incase she needs a visit with JE.

## 2021-02-14 NOTE — Telephone Encounter (Signed)
Kaitlyn Powell's pheglm production has not slowed down. Anitibiotic will run out tomorrow. They are doing breathing treatments and taking other medicines to help, but it hasn't stopped. Mostly wondering if the antibiotic should be extended or if there is anything else they can do. Please advise.

## 2021-02-14 NOTE — Telephone Encounter (Signed)
Thank you for the update. Please extend levaquin to 750 mg daily x 7 days.  Recommend oxygen to maintain SpO2 >88%  If she continues to worsen including worsening oxygen levels, she may need to be evaluated in the ED if this is still consistent with her goals of care  If she does not improve on antibiotics we can obtain sputum culture next week during clinic visit with NP. If no availability, please message me on Monday for work in if needed.

## 2021-02-17 NOTE — Telephone Encounter (Signed)
Called patient's sister however no answer. Left brief message.  I would interpret that test as positive even if it is faintly positive. It may be worth re-testing since is still having active symptoms. If the second test is negative sputum culture would be the next step.  We can provide sputum cups for [facility] to pick up from the office and can collect sputum at home.

## 2021-02-17 NOTE — Telephone Encounter (Signed)
Called and spoke with [facility]. She stated that the patient's symptoms did not improve over the weekend. She is still taking the Levaquin but the phlegm appears to be even thicker and still green. Kaitlyn Powell was able to give her a COVID test on Friday and it came back positive. She stated that test stayed negative until about 6-7 minutes left, then a very faint line appeared in the positive section. She wonders if it was false positive.   She is still doing her breathing treatments every 4 hours.   She is willing to stop the office to collect sputum samples if Dr. Everardo All believes this will be the next best step. She wanted to bring her into the office but due to the covid test and her not feeling well and being weak, she wants to hold off for now.   Dr. Everardo All, can you please advise? Thanks!

## 2021-02-17 NOTE — Telephone Encounter (Signed)
Called and spoke with [facility]. Dr. George Hugh recommendations given. Kaitlyn Powell stated she would retest Patient tonight or in the morning. Kaitlyn Powell stated she would call with results for Dr. Everardo All and sputum cups if needed.

## 2021-02-18 ENCOUNTER — Telehealth (INDEPENDENT_AMBULATORY_CARE_PROVIDER_SITE_OTHER): Payer: Medicare Other | Admitting: Neurology

## 2021-02-18 ENCOUNTER — Other Ambulatory Visit: Payer: Self-pay

## 2021-02-18 ENCOUNTER — Encounter: Payer: Self-pay | Admitting: Neurology

## 2021-02-18 VITALS — Ht 59.0 in | Wt 160.0 lb

## 2021-02-18 DIAGNOSIS — Q909 Down syndrome, unspecified: Secondary | ICD-10-CM | POA: Diagnosis not present

## 2021-02-18 DIAGNOSIS — F0280 Dementia in other diseases classified elsewhere without behavioral disturbance: Secondary | ICD-10-CM | POA: Diagnosis not present

## 2021-02-18 DIAGNOSIS — G20 Parkinson's disease: Secondary | ICD-10-CM

## 2021-02-18 DIAGNOSIS — G40211 Localization-related (focal) (partial) symptomatic epilepsy and epileptic syndromes with complex partial seizures, intractable, with status epilepticus: Secondary | ICD-10-CM | POA: Diagnosis not present

## 2021-02-18 NOTE — Patient Instructions (Signed)
You are in my prayers! Continue all your medications, let me know if you need refills. Let's plan for a follow-up in 4 months, call for any changes.

## 2021-02-18 NOTE — Progress Notes (Signed)
July 1tth seizure

## 2021-02-18 NOTE — Progress Notes (Signed)
Virtual Visit via Video Note The purpose of this virtual visit is to provide medical care while limiting exposure to the novel coronavirus.    Consent was obtained for video visit:  Yes.   Answered questions that patient had about telehealth interaction:  Yes.   I discussed the limitations, risks, security and privacy concerns of performing an evaluation and management service by telemedicine. I also discussed with the patient that there may be a patient responsible charge related to this service. The patient expressed understanding and agreed to proceed.  Pt location: Home Physician Location: office Name of referring provider:  Tracey Harries, MD I connected with [name] at patient's sister's initiation/request on 02/18/2021 at  2:00 PM EDT by video enabled telemedicine application and verified that I am speaking with the correct person using two identifiers. Pt MRN:  [id number] Pt DOB:  1956/05/18 Video Participants:  [name];  [name] (sister)   History of Present Illness:  The patient had a virtual video visit on 02/18/2021. She was last seen 5 months ago for intractable epilepsy and dementia due to Down syndrome. Her sister [name] is present to provide history. There has again been a significant change in the past 5 months. On her last visit, she was awake, smiling, talking in sentences (albeit dysarthric). Today she is sitting with her mouth open, with increased work of breathing, non-verbal, does not follow commands. [name] reports she was treated 1.5 months ago for pneumonia, this cleared up and she did not have a lot of phlegm anymore. Then at the beginning of July, she started having the same symptoms and was restarted on antibiotic on 7/10. She had been seizure-free for since January 2021 until last 7/11, again while on the commode similar to her prior seizures, after having a big bowel movement, they helped her stand then she went limp in their arms. She was not coming  around, taking her a minute to wake up. She then had a positive COVID test, still positive today. [name] reports that she is more and more non-verbal, they can hardly get her to say anything. She may say a few words when half-asleep, but then when she is fully awake she is non-verbal. She gets a breathing treatment four times a day and has been using her nasal cannula this week. She cannot expectorate, her sister swabs her mouth frequently. All her medications go through her G-tube. When she is more awake, she eats soft pureed food and swallows fine, but with her recent increased work of breathing, they have been giving Osmolyte through her G-tube. She takes deep breaths and appears anxious to her family. She requires a patient lift for transfers and family tries to keep her upright on the chair during the day. Sometimes her days and nights are mixed up, but the past 3 nights she slept well.   She is on Dilantin 100mg  in AM, 200mg  in PM, Epidiolex 3.60mL BID, and Levetiracetam 500mg  in AM, 1000mg  in PM without side effects. She is on Sinemet 25/100mg  2 tabs TID without side effects.    History on Initial Assessment 09/02/2018: This is a pleasant 65 year old left-handed woman with a history of Down syndrome, sleep apnea, hypothyroidism, sick sinus syndrome s/p PPM, presenting for second opinion regarding seizures. reports that Gila's seizures started in her teens/early 23s, they would be walking and all of a sudden she would drop down. She would be awake and appear to lose all muscle tone, family would pick  her up and she would be fine. She had been taking Dilantin for many years. [name] reports a period of time where she was not having seizures, or at least they were not hearing any news from her group home. In 2017, she became quite ill and was treated in [place] for pneumonia, CHF, and pulmonary embolism. She was on Lovenox until November 2017. There was concern for macrocytic anemia with normal B12 level  possibly due to prolonged Dilantin use, she had been on Dilantin 200mg  BID. She had an infected PEG tube and had a GTC lasting 20 minutes. She was seen by neurologist Dr. in 2018 due to drop seizures occurring on a daily basis with loss of consciousness lasting less than a minute. She was started on Levetiracetam, however with attempt to wean down Dilantin, she had several GTCs. She was restarted on Dilantin 200mg  BID and Keppra dose was reduced. She had an MRI brain without contrast in July 2018 which showed congenital microcephaly, moderate diffuse volum loss, moderate chronic microvascular disease. Routine EEG reported moderate to severe background slowing with rare generalized spike slow waves. The daily episodes decreased in frequency and would only occur when she was on the toilet. Initially they were occurring when she was having a BM and family could visibly see her abdomen straining, however recently they have occurred while peeing and without any visible straining. She would be out for 10-15 seconds, then wake up looking like she is trying to grab something or moving her hands L>R like she is scratching her leg. She would be talking gibberish or incoherent like baby talk when she starts coming around. A few times she would black out and go backward like someone pushed her back hard. She had a 3-day ambulatory video EEG study in November 2018. Background was diffusely slow with sharp and spike and wave discharges in the left frontotemporal region, bihemispheric, bisynchronous spike and wave discharges, isolated bursts of generalized spike and wave discharges, independent discharges in the right frontotemporal region. In general, there was a bifrontal predominance of discharges including the presence of occasional 2-2.5 slow spike and wave discharges. There was decreased amplitude over the right temporal area. There were 6 push button events, all due to "drop seizure on toilet." With 5 events, there was  note of diffuse slow background with several bursts of bilateral rhythmic delta activity/intermittent delta activity. It was noted that event #6 had generalized frontally predominant 2 Hz spike and wave activity lasting 17 seconds with some after coming slow waves. Keppra dose was increased to 2500mg  daily at one point, then dose was reduced to 1000mg  qhs. She was tried on Lamotrigine but could not tolerate 25mg  dose, "looked like she was institutionalized." On 08/01/18, December 2018 could not wake her up in the morning, she would open her eyes but was unresponsive. [name] sat her up and she started jerking for 10 minutes. They tried to stand her up after but they were dragging her like she was drunk and could not move her legs. She started waking up and family gave her medications, went back to sleep, then again had difficulty waking her up followed by upper body jerking when she was sat up. She was found to have a pneumonia. Keppra dose increased to 1000mg  BID in addition to Dilantin 200mg  BID. Family reports some days she would have 3 in one day, other times she would go 48 hours without an event. Last episode was yesterday. She is also on Risperdal 1mg  BID,  family is unsure when or why this was prescribed, she is always happy and always positive.   Update 02/13/2019: Vimpat has been quite sedating to her, she is only on 25mg  qhs, higher doses caused significant drowsiness. She had a 72-hour ambulatory video EEG study from June 30-February 03, 2019 which I personally reviewed, baseline EEG was abnormal with frequent generalized spikes, independent epileptiform discharges over the left and right temporal regions. There were 3 push button events, all in the bathroom, with 2 of them she is sitting on the commode then suddenly jerks back and appears unresponsive for a few seconds. There were no clear epileptiform discharges during these episodes but there was low voltage fast activity/muscle, different from her baseline. No EKG  changes. The third episode was lethargy/decreased responsiveness with no body jerk. Her sister has also noticed that she is having more cognitive issues, having a harder time finding her words. She has noticed that she would hold her hand in a pincer position more often. She is currently drowsy after her meal, which is typical for her.   Prior AEDs: Lamotrigine, Vimpat, Topamax   Diagnostic Data:  MRI brain with and without contrast done 05/2019 showed severe brain atrophy, especially in the temporal lobes, confluent FLAIR hyperintensity in the deep cerebral white matter, no acute changes.  Ambulatory EEG in 01/2019: This 65-hour ambulatory EEG study is abnormal due to the presence of: 1. Generalized background slowing 2. Frequent generalized 2-2.5 Hz slow spike and wave discharges 3. Multifocal epileptiform discharges over the bifrontal/frontopolar, left > right temporal regions 4. There were 3 episodes captured, 2 with typical drop attacks with associated diffuse background suppression         Current Outpatient Medications on File Prior to Visit  Medication Sig Dispense Refill   albuterol (PROVENTIL) (2.5 MG/3ML) 0.083% nebulizer solution Take 2.5 mg by nebulization every 6 (six) hours as needed for wheezing or shortness of breath.     arformoterol (BROVANA) 15 MCG/2ML NEBU Take 2 mLs (15 mcg total) by nebulization 2 (two) times daily. 120 mL 6   budesonide (PULMICORT) 0.5 MG/2ML nebulizer solution INHALE 1 VIAL BY NEBULIZATION IN THE MORNING AND AT BEDTIME. 120 mL 11   cannabidiol (EPIDIOLEX) 100 MG/ML solution GIVE 3.7 ML BY MOUTH 2 TIMES A DAY. REFILLS CALL: [phone]. 222 mL 5   carbidopa-levodopa (SINEMET IR) 25-100 MG tablet PLACE 2 TABLETS INTO FEEDING TUBE 3 (THREE) TIMES DAILY. TAKE 2 TABLET THREE TIMES A DAY WITH MEALS 216 tablet 0   cyclobenzaprine (FLEXERIL) 5 MG tablet Take 1/2 to 1 tablet at night as needed for muscle spasms 30 tablet 4   diclofenac Sodium (VOLTAREN) 1 %  GEL Apply 2 g topically 4 (four) times daily. To affected joint. 350 g 6   folic acid (FOLVITE) 1 MG tablet Take 1 mg by mouth daily.     furosemide (LASIX) 20 MG tablet Take 1 tablet (20 mg total) by mouth daily for 3 days. 30 tablet 0   Incontinence Supplies (EXTENSION TUBING/CONNECTOR) MISC Use with PEG feedings change every 5 days Mic-key extension 6 each 11   levETIRAcetam (KEPPRA) 100 MG/ML solution Give 5mL (500mg ) in AM, 10mL (1000mg ) in PM 473 mL 11   levofloxacin (LEVAQUIN) 750 MG tablet Take 1 tablet (750 mg total) by mouth daily. 7 tablet 0   levothyroxine (SYNTHROID) 150 MCG tablet Place 150 mcg into feeding tube daily before breakfast.     NONFORMULARY OR COMPOUNDED ITEM Antifungal solution: Terbinafine 3%, Fluconazole 2%,  Tea Tree Oil 5%, Urea 10%, Ibuprofen 2% in DMSO suspension #30mL 1 each 3   Nutritional Supplements (FEEDING SUPPLEMENT, OSMOLITE 1.2 CAL,) LIQD 1 can of Osmolite 1.2 bolus feeding daily Disp 30 day supply refill 1 year 237 mL 11   OXYGEN Inhale 3 L into the lungs as directed. With CPAP     phenytoin (DILANTIN) 125 MG/5ML suspension Take 48mL (100mg ) in AM, 87mL (200mg ) in PM 1080 mL 3   rivaroxaban (XARELTO) 20 MG TABS tablet Take 1 tablet (20 mg total) by mouth daily with supper. 30 tablet 3   Sennosides (SENNA) 8.8 MG/5ML SYRP TAKE 10 MLS BY MOUTH DAILY AS NEEDED 237 mL 1   simvastatin (ZOCOR) 40 MG tablet TAKE 1 TABLET (40 MG TOTAL) BY MOUTH AT BEDTIME. PLEASE KEEP UPCOMING APPT FOR FUTURE REFILLS. 90 tablet 3   sodium chloride (MURO 128) 5 % ophthalmic ointment Place 1 application into both eyes at bedtime.     SYRINGE DISPOSABLE 60CC 60 ML MISC 1 syringe per feeding three times a day 90 each 12   vitamin B-12 (CYANOCOBALAMIN) 100 MCG tablet Place 100 mcg into feeding tube daily.      [DISCONTINUED] Omeprazole Magnesium (PRILOSEC) 10 MG PACK 20 mg by PEG Tube route daily before breakfast. 60 each 3   No current facility-administered medications on file prior to  visit.     Observations/Objective:   Vitals:   02/18/21 1339  Weight: 160 lb (72.6 kg)  Height: 4\' 11"  (1.499 m)   GEN:  The patient appears stated age and is in NAD.  Neurological examination: Patient's eyes are open but she does not focus, does not track, mouth is open. She is non-verbal and does not follow commands.    Assessment and Plan:   This is a pleasant 65 yo LH woman with a history of Down syndrome with dementia, sleep apnea, hypothyroidism, sick sinus syndrome s/p PPM, and seizures. Her 72-hour EEG captured episodes concerning for drop attacks with diffuse low voltage fast activity on EEG. She has had an excellent response to Epidiolex and had been seizure-free since January 2021 until she had a breakthrough seizure on 02/10/21 in the setting of an upper respiratory infection. We agreed to continue Dilantin 100mg  in AM, 200mg  in PM, Levetiracetam 500mg  in AM, 1000mg  in PM and Epidiolex 3.55mL BID. Continue Sinemet 25/100mg  2 tabs TID. She is having increased work of breathing and requires 24/7 care. We discussed goals of care, continue follow-up with Pulmonary but may have to consider Hospice Care/comfort care if symptoms do not improve. Follow-up in 4 months, call for any changes.    Follow Up Instructions:   -I discussed the assessment and treatment plan with the patient's sister. The patient's sister was provided an opportunity to ask questions and all were answered. The patient's sister agreed with the plan and demonstrated an understanding of the instructions.   The patient's sister was advised to call back or seek an in-person evaluation if the symptoms worsen or if the condition fails to improve as anticipated.     February 2021, MD

## 2021-02-19 ENCOUNTER — Telehealth: Payer: Self-pay | Admitting: Pulmonary Disease

## 2021-02-19 ENCOUNTER — Telehealth: Payer: Self-pay | Admitting: *Deleted

## 2021-02-19 MED ORDER — RIVAROXABAN 20 MG PO TABS: 20.0000 mg | ORAL_TABLET | Freq: Every day | ORAL | 5 refills | Status: AC

## 2021-02-19 MED ORDER — PREDNISONE 10 MG PO TABS: ORAL_TABLET | ORAL | 0 refills | Status: DC

## 2021-02-19 NOTE — Telephone Encounter (Signed)
Would suggest  Delsym 5 mL 3 times daily as needed for cough  Prednisone 10 mg tabs  Take 2 tabs daily with food x 5ds, then 1 tab daily with food x 5ds then STOP   Since she was positive last week, she would be beyond the window for COVID medications  If oxygen level gets worse, she may have to go to the hospital   Spoke with [facility] and notified of response from Dr Vassie Loll. She verbalized understanding. Rx sent to pharm. Nothing further needed at this time.

## 2021-02-19 NOTE — Telephone Encounter (Signed)
-----   Message from Hillis Range, MD sent at 02/15/2021 12:40 PM EDT ----- Continue current dosing.   ----- Message ----- From: Kandace Parkins, RN Sent: 02/07/2021   5:35 PM EDT To: Hillis Range, MD  Dr. Johney Frame,  After speaking with sister, who takes care of patient, she is reluctant to Xarelto reduction of 10mg . She states this was tried in past but never started the lower dose as the pt was taken off for a short time and had to be put back on full dose of Xarelto. She asked to continue the full dose of Xarelto 20mg  since pt is very compliant and has had no issues with it.   Also, investigated further and noted that the pt is on Dilantin as well and it decreases the effects of Xarelto. She was on Dilantin when the recommendation was made for the reduction in the OV note 01/19/2019. Spoke with Pharmacy Team again- [facility] and 01/21/2019 and they have recommended to see if the pt can continue the higher doseage of Xarelto since the effects will be decreased.   Please advise again and thank you    ----- Message ----- From: Victoria, MD Sent: 02/06/2021   9:49 PM EDT To: Hillis Range, RN  I agree.  Can you reduce her dose to 10mg  daily?   ----- Message ----- From: 04/09/2021, RN Sent: 02/06/2021   1:02 PM EDT To: , MD  Dr. Kandace Parkins,   Xarelto 20mg  refill request received. She has been on this dose for a long time and just want to ensure you want to continue at this dose. She is on it for DVT & PE. Assessed the last 2 years of your notes and in the OV note on 01/19/2019 it states: "Prior DVT/ PTE Consider long term anticoagulation Reduce xarelto to 10mg  daily after 6 months of therapy".  Pt is 65 years old, weight-74.8kg, Crea-1.13 on 05/09/2020, Diagnosis-DVT (2020) & PE (2017), CrCl-59.25ml/min.   We just want to ensure you want to continue with Xarelto 20mg . Spoke with 77, PharmD and states since multiple events send in requested Xarelto and to avoid missed  doses and wait for a response from you. A refill was sent to avoid missed doses. Please advise and thank you.

## 2021-02-19 NOTE — Telephone Encounter (Signed)
Spoke with pt's sister, [name]  She states pt took another at home covid test 02/19/21 and it was positive  She had taken one last wk and it was very faintly pos She is still on levaquin and this is her second round Having more congestion and states she is unable to cough or blow her nose  Her sats were 88% on ra so they have her on o2 2lpm and now sats 94%  Still using neb with albuterol every 4 hours She is not having any fever  She states that she she is wondering if there is some cough med she can take that would help Mucinex not helping  Please advise thanks

## 2021-02-24 ENCOUNTER — Other Ambulatory Visit: Payer: Self-pay | Admitting: Pulmonary Disease

## 2021-02-24 ENCOUNTER — Telehealth: Payer: Self-pay | Admitting: Pulmonary Disease

## 2021-02-24 DIAGNOSIS — J159 Unspecified bacterial pneumonia: Secondary | ICD-10-CM

## 2021-02-24 NOTE — Telephone Encounter (Signed)
Pts sister, [name] stated that Kaitlyn Powell tested negative for Covid-19 this morning on a home test and would like to know if they are able to come to the office and get a sputum cup for her tomorrow (02/25/2021).  **She originally tested positive for Covid-19 on February 14, 2021**  Pls regard; [phone]

## 2021-02-25 ENCOUNTER — Encounter: Payer: Self-pay | Admitting: *Deleted

## 2021-02-25 NOTE — Telephone Encounter (Addendum)
Lab orders placed and sister already aware to pick up cups per [facility]. Nothing further needed.

## 2021-02-25 NOTE — Telephone Encounter (Signed)
Spoke with sister [name] who is asking about coming to pick up sputum cups for patient. [name] stated that Pearlie tested negative for Covid-19 this morning on a home test and would like to know if they are able to come to the office and get a sputum cup for her. She originally tested positive for Covid-19 on February 14, 2021. Advised sister that I would put sputum cups up front for her to pick up.   Dr. Everardo All what orders would you like placed for her sputum?

## 2021-02-25 NOTE — Telephone Encounter (Signed)
Please obtain respiratory culture and AFB

## 2021-02-27 NOTE — Telephone Encounter (Signed)
Will forward to triage to assure cups have been made available for pt pick up.

## 2021-03-07 NOTE — Progress Notes (Signed)
Remote pacemaker transmission.   

## 2021-03-10 ENCOUNTER — Ambulatory Visit: Payer: Medicare Other | Admitting: Family Medicine

## 2021-03-17 ENCOUNTER — Other Ambulatory Visit: Payer: Self-pay | Admitting: Neurology

## 2021-04-02 ENCOUNTER — Other Ambulatory Visit: Payer: Self-pay | Admitting: Neurology

## 2021-04-05 ENCOUNTER — Telehealth: Payer: Self-pay | Admitting: Pulmonary Disease

## 2021-04-05 MED ORDER — PREDNISONE 20 MG PO TABS: 20.0000 mg | ORAL_TABLET | Freq: Every day | ORAL | 0 refills | Status: AC

## 2021-04-05 MED ORDER — AMOXICILLIN-POT CLAVULANATE 875-125 MG PO TABS: 1.0000 | ORAL_TABLET | Freq: Two times a day (BID) | ORAL | 0 refills | Status: DC

## 2021-04-05 MED ORDER — DOXYCYCLINE HYCLATE 100 MG PO TABS: 100.0000 mg | ORAL_TABLET | Freq: Two times a day (BID) | ORAL | 0 refills | Status: DC

## 2021-04-05 NOTE — Telephone Encounter (Signed)
Received page from after-hours answering service.  Connected with Sister Kaitlyn Powell.  Couple days of clear white phlegm production, O2 saturations high 80s on room air.  Now using 3 L.  Similar presentation a couple months ago requiring extended course of antibiotics, levofloxacin, as well as prednisone.  Reviewed chest x-ray suspect 01/02/21 though my tradition of appears more consistent with vascular congestion and pulmonary edema.  They deny any lower extremity swelling, significant weight changes.  Orthopnea PND difficult to assess as patient has Down syndrome, communication is difficult.  Do wonder a bit about reactive airways disease given response to prednisone in the past.  Clear phlegm is relatively reassuring.  They deny any fevers but no temperatures as high as 99.5.  We will send in course of Augmentin twice daily x7 days (confirmed no known penicillin allergy, only adverse reaction with seizures with Keflex in the past) and prednisone 20 mg daily x 5 days.  Asked her to contact office when we are reopen on Tuesday after the holiday weekend.  If things not getting better would likely need a chest x-ray.  If things were to worsen, they understand the need to go to the emergency room.

## 2021-04-05 NOTE — Telephone Encounter (Signed)
Was contacted again from after-hours answering service.  Connected with [facility].  Evidently there is no [facility] in any [facility] in [place] or the surrounding area.  We will discontinue Augmentin.  Doxycycline 100 mg twice daily x7 days sent to local pharmacy.  Discussed with [name] and she expressed understanding of plan.

## 2021-04-05 NOTE — Addendum Note (Signed)
Addended byVilma Meckel on: 04/05/2021 05:42 PM   Modules accepted: Orders

## 2021-04-08 ENCOUNTER — Telehealth: Payer: Self-pay | Admitting: Pulmonary Disease

## 2021-04-08 ENCOUNTER — Other Ambulatory Visit (INDEPENDENT_AMBULATORY_CARE_PROVIDER_SITE_OTHER): Payer: Medicare Other

## 2021-04-08 ENCOUNTER — Ambulatory Visit: Payer: Medicare Other

## 2021-04-08 ENCOUNTER — Ambulatory Visit (INDEPENDENT_AMBULATORY_CARE_PROVIDER_SITE_OTHER): Payer: Medicare Other

## 2021-04-08 DIAGNOSIS — J209 Acute bronchitis, unspecified: Secondary | ICD-10-CM

## 2021-04-08 LAB — CBC WITH DIFFERENTIAL/PLATELET
Basophils Absolute: 0 10*3/uL (ref 0.0–0.1)
Basophils Relative: 0.9 % (ref 0.0–3.0)
Eosinophils Absolute: 0 10*3/uL (ref 0.0–0.7)
Eosinophils Relative: 0.2 % (ref 0.0–5.0)
HCT: 31.9 % — ABNORMAL LOW (ref 36.0–46.0)
Hemoglobin: 10.8 g/dL — ABNORMAL LOW (ref 12.0–15.0)
Lymphocytes Relative: 9.7 % — ABNORMAL LOW (ref 12.0–46.0)
Lymphs Abs: 0.5 10*3/uL — ABNORMAL LOW (ref 0.7–4.0)
MCHC: 33.8 g/dL (ref 30.0–36.0)
MCV: 100.8 fl — ABNORMAL HIGH (ref 78.0–100.0)
Monocytes Absolute: 0.3 10*3/uL (ref 0.1–1.0)
Monocytes Relative: 7.1 % (ref 3.0–12.0)
Neutro Abs: 4 10*3/uL (ref 1.4–7.7)
Neutrophils Relative %: 82.1 % — ABNORMAL HIGH (ref 43.0–77.0)
Platelets: 102 10*3/uL — ABNORMAL LOW (ref 150.0–400.0)
RBC: 3.16 Mil/uL — ABNORMAL LOW (ref 3.87–5.11)
RDW: 14.5 % (ref 11.5–15.5)
WBC: 4.8 10*3/uL (ref 4.0–10.5)

## 2021-04-08 LAB — BASIC METABOLIC PANEL
BUN: 29 mg/dL — ABNORMAL HIGH (ref 6–23)
CO2: 26 mEq/L (ref 19–32)
Calcium: 9.1 mg/dL (ref 8.4–10.5)
Chloride: 89 mEq/L — ABNORMAL LOW (ref 96–112)
Creatinine, Ser: 0.71 mg/dL (ref 0.40–1.20)
GFR: 89.57 mL/min (ref >60.00)
Glucose, Bld: 83 mg/dL (ref 70–99)
Potassium: 4.5 mEq/L (ref 3.5–5.1)
Sodium: 123 mEq/L — ABNORMAL LOW (ref 135–145)

## 2021-04-08 NOTE — Telephone Encounter (Signed)
Please see 04/08/2021 phone note.

## 2021-04-08 NOTE — Telephone Encounter (Signed)
Spoke to patient's sister, [name](DPR). [name] stated that patient was prescribed doxy over the weekend by on call provider.  [name] is requesting a CXR, due to recurrent infections and need for abx.  C/o fatigued, bilateral crackles heard by brother in law who is a Charity fundraiser, sob with exertion, cough prod cough with clear sputum. Unable to obtain sputum culture. Sx developed 04/04/2021. Duoneb QID, bipap QHS pulmicort BID. She is not using brovana.   Applied 2L after spo2 dropped to 88% on roomair. Spo2 recovered to 95% with 2L.  No recent covid test.  Two covid vaccines and 1 booster.  [name] is concerned about PNA.   Dr. Maple Hudson, please advise. Dr. Everardo All please advise. Thanks.   Current Outpatient Medications on File Prior to Visit  Medication Sig Dispense Refill   albuterol (PROVENTIL) (2.5 MG/3ML) 0.083% nebulizer solution Take 2.5 mg by nebulization every 6 (six) hours as needed for wheezing or shortness of breath.     arformoterol (BROVANA) 15 MCG/2ML NEBU Take 2 mLs (15 mcg total) by nebulization 2 (two) times daily. 120 mL 6   budesonide (PULMICORT) 0.5 MG/2ML nebulizer solution INHALE 1 VIAL BY NEBULIZATION IN THE MORNING AND AT BEDTIME. 120 mL 11   cannabidiol (EPIDIOLEX) 100 MG/ML solution GIVE 3.7 ML BY MOUTH 2 TIMES A DAY. REFILLS CALL: [phone]. 222 mL 5   carbidopa-levodopa (SINEMET IR) 25-100 MG tablet PLACE 2 TABLETS INTO FEEDING TUBE 3 (THREE) TIMES DAILY. TAKE 2 TABLET THREE TIMES A DAY WITH MEALS 216 tablet 0   cyclobenzaprine (FLEXERIL) 5 MG tablet Take 1/2 to 1 tablet at night as needed for muscle spasms 30 tablet 4   diclofenac Sodium (VOLTAREN) 1 % GEL Apply 2 g topically 4 (four) times daily. To affected joint. 350 g 6   doxycycline (VIBRA-TABS) 100 MG tablet Take 1 tablet (100 mg total) by mouth 2 (two) times daily. 14 tablet 0   folic acid (FOLVITE) 1 MG tablet Take 1 mg by mouth daily.     furosemide (LASIX) 20 MG tablet TAKE 1 TABLET BY MOUTH DAILY FOR 3 DAYS. 30 tablet 0    Incontinence Supplies (EXTENSION TUBING/CONNECTOR) MISC Use with PEG feedings change every 5 days Mic-key extension 6 each 11   levETIRAcetam (KEPPRA) 100 MG/ML solution GIVE (500MG ) IN AM, (1000MG ) IN PM 473 mL 0   levothyroxine (SYNTHROID) 150 MCG tablet Place 150 mcg into feeding tube daily before breakfast.     NONFORMULARY OR COMPOUNDED ITEM Antifungal solution: Terbinafine 3%, Fluconazole 2%, Tea Tree Oil 5%, Urea 10%, Ibuprofen 2% in DMSO suspension #75mL 1 each 3   Nutritional Supplements (FEEDING SUPPLEMENT, OSMOLITE 1.2 CAL,) LIQD 1 can of Osmolite 1.2 bolus feeding daily Disp 30 day supply refill 1 year 237 mL 11   OXYGEN Inhale 3 L into the lungs as directed. With CPAP     phenytoin (DILANTIN) 125 MG/5ML suspension Take 83mL (100mg ) in AM, 70mL (200mg ) in PM 1080 mL 3   predniSONE (DELTASONE) 20 MG tablet Take 1 tablet (20 mg total) by mouth daily with breakfast for 5 days. 5 tablet 0   rivaroxaban (XARELTO) 20 MG TABS tablet Take 1 tablet (20 mg total) by mouth daily with supper. 30 tablet 5   Sennosides (SENNA) 8.8 MG/5ML SYRP TAKE 10 MLS BY MOUTH DAILY AS NEEDED 237 mL 1   simvastatin (ZOCOR) 40 MG tablet TAKE 1 TABLET (40 MG TOTAL) BY MOUTH AT BEDTIME. PLEASE KEEP UPCOMING APPT FOR FUTURE REFILLS. 90 tablet 3  sodium chloride (MURO 128) 5 % ophthalmic ointment Place 1 application into both eyes at bedtime.     SYRINGE DISPOSABLE 60CC 60 ML MISC 1 syringe per feeding three times a day 90 each 12   vitamin B-12 (CYANOCOBALAMIN) 100 MCG tablet Place 100 mcg into feeding tube daily.      [DISCONTINUED] Omeprazole Magnesium (PRILOSEC) 10 MG PACK 20 mg by PEG Tube route daily before breakfast. 60 each 3   No current facility-administered medications on file prior to visit.    Allergies  Allergen Reactions   Cephalexin Other (See Comments)    Seizures    Namenda [Memantine Hcl] Other (See Comments)    Made memory issues worse, sleepiness, increased confusion

## 2021-04-08 NOTE — Telephone Encounter (Signed)
If she can be worked in today with an APP that would be best. Otherwise- order outpatient CXR, CBC w diff, BMET for dx acute bronchitis If she gets worse before she can be seen, go to UC or ED.

## 2021-04-08 NOTE — Telephone Encounter (Signed)
No availability with NP's today.  CXR, CBC and BMET has been ordered.   Patient's sister, [name](DPR) is aware and voiced her understanding.  Nothing further needed.

## 2021-04-08 NOTE — Telephone Encounter (Signed)
Spoke to patient's sister, [name](DPR), who is requesting CXR results.  [name] is aware that scan has not been read by radiology yet. Kathrynn Running that our office would contact her once read and reviewed by provide. She voiced her understanding and had no further questions. Nothing further needed.

## 2021-04-09 ENCOUNTER — Telehealth: Payer: Self-pay | Admitting: Pulmonary Disease

## 2021-04-09 NOTE — Telephone Encounter (Signed)
Called and spoke with patient's sister [name], on [place]. Advised her that the CXR was clear. She questioned why there was a diagnosis of acute bronchitis. I explained that there needed to be a diagnosis attached to order for insurance purposes, and he was going off of patient's symptoms. I also went over lab results, advised her to contact PCP to go over labs and treatment. [name] verbalized understanding.  Nothing further needed at this time.

## 2021-04-09 NOTE — Telephone Encounter (Signed)
Pt sister calling back for xray results

## 2021-04-11 NOTE — Telephone Encounter (Signed)
Please schedule patient for follow-up with me when next available. In-person, video or telephone ok. Patient/family preference.

## 2021-04-11 NOTE — Telephone Encounter (Signed)
Attempted to call pt's sister [name] but line went directly to VM.  Left message for [name] to return call.  When [name] returns call, please schedule appt next avail with Dr. Everardo All (can be in-person, video, or televisit).

## 2021-04-13 ENCOUNTER — Other Ambulatory Visit: Payer: Self-pay | Admitting: Neurology

## 2021-04-14 ENCOUNTER — Telehealth (INDEPENDENT_AMBULATORY_CARE_PROVIDER_SITE_OTHER): Payer: Medicare Other | Admitting: Pulmonary Disease

## 2021-04-14 ENCOUNTER — Other Ambulatory Visit: Payer: Self-pay | Admitting: Neurology

## 2021-04-14 ENCOUNTER — Other Ambulatory Visit (HOSPITAL_COMMUNITY): Payer: Self-pay

## 2021-04-14 ENCOUNTER — Ambulatory Visit: Payer: Medicare Other | Admitting: Pulmonary Disease

## 2021-04-14 DIAGNOSIS — J45909 Unspecified asthma, uncomplicated: Secondary | ICD-10-CM

## 2021-04-14 DIAGNOSIS — Z8701 Personal history of pneumonia (recurrent): Secondary | ICD-10-CM | POA: Diagnosis not present

## 2021-04-14 DIAGNOSIS — G4733 Obstructive sleep apnea (adult) (pediatric): Secondary | ICD-10-CM

## 2021-04-14 DIAGNOSIS — R0989 Other specified symptoms and signs involving the circulatory and respiratory systems: Secondary | ICD-10-CM | POA: Diagnosis not present

## 2021-04-14 MED ORDER — SCOPOLAMINE 1 MG/3DAYS TD PT72: 1.0000 | MEDICATED_PATCH | TRANSDERMAL | 2 refills | Status: AC

## 2021-04-14 NOTE — Progress Notes (Signed)
Virtual Visit via Video Note  I connected with Kaitlyn Powell on 04/14/21 at 10:30 AM EDT by a video enabled telemedicine application and verified that I am speaking with the correct person using two identifiers.  Location: Patient: Home Provider: [facility] office   I discussed the limitations of evaluation and management by telemedicine and the availability of in person appointments. The patient expressed understanding and agreed to proceed.   I discussed the assessment and treatment plan with the patient. The patient was provided an opportunity to ask questions and all were answered. The patient agreed with the plan and demonstrated an understanding of the instructions.   The patient was advised to call back or seek an in-person evaluation if the symptoms worsen or if the condition fails to improve as anticipated.   Subjective:   PATIENT ID: Kaitlyn Powell GENDER: female DOB: January 14, 1956, MRN: [id number]   HPI  Chief Complaint  Patient presents with   Follow-up    Sob     Reason for Visit: Follow-up  [name] is a 65 year old female with Down syndrome, Parkinson's disease, epilepsy and OSA on BiPAP who presents for follow-up.  Patient is unable to participate in visit due to mental status. Her sister and brother-in-law provide history noted below.  01/02/21 Since our last visit she has become less ambulatory and currently unable to bear weight or walk. Family has requested Palliative consult and has an appointment scheduled for 6/15. Our clinic was contacted regarding worsening shortness of breath and hypoxemia on room air. She is compliant with her nocturnal oxygen of 3L via [place] at home however the hypoxemia during the daytime is new and she has developed crackles per her caregivers. She on Pulmicort and Duonebs. Secretions has been minimal. She is less alert and breathing hard. Her oxygen during the day has been < 88% and will improve on 2L to  95%.  04/14/21 Since our last visit, she was treated with extended course of antibiotics for acute hypoxemic respiratory failure suspected to be related to aspiration. Today she has productive cough with crackles. Secretions clear. Some days she has more drooling especially when she is less alert. Uses guafenisin up to four times daily with symptoms still. Compliant with Pulmicort and Duonebs. She has since been weaned off oxygen.  She had CXR 9/6 which was negative negative.  Past Medical History:  Diagnosis Date   Arthritis    Aspiration pneumonia (HCC)    Asthma    Cardiac arrhythmia    have pacemeaker   Down syndrome    DVT of popliteal vein (HCC) 09/08/2018   chronic   GERD (gastroesophageal reflux disease)    Heart block    Hypothyroidism    Mental retardation    Pacemaker    Parkinson's disease (HCC) 05/2019   Pulmonary emboli (HCC)    Seizures (HCC)    Sleep apnea with use of continuous positive airway pressure (CPAP)      Allergies  Allergen Reactions   Cephalexin Other (See Comments)    Seizures    Namenda [Memantine Hcl] Other (See Comments)    Made memory issues worse, sleepiness, increased confusion     Outpatient Medications Prior to Visit  Medication Sig Dispense Refill   albuterol (PROVENTIL) (2.5 MG/3ML) 0.083% nebulizer solution Take 2.5 mg by nebulization every 6 (six) hours as needed for wheezing or shortness of breath.     budesonide (PULMICORT) 0.5 MG/2ML nebulizer solution INHALE 1 VIAL BY NEBULIZATION IN THE  MORNING AND AT BEDTIME. 120 mL 11   cannabidiol (EPIDIOLEX) 100 MG/ML solution GIVE 3.7 ML BY MOUTH 2 TIMES A DAY. REFILLS CALL: [phone]. 222 mL 5   carbidopa-levodopa (SINEMET IR) 25-100 MG tablet PLACE 2 TABLETS INTO FEEDING TUBE 3 (THREE) TIMES DAILY. TAKE 2 TABLET THREE TIMES A DAY WITH MEALS 216 tablet 0   cyclobenzaprine (FLEXERIL) 5 MG tablet Take 1/2 to 1 tablet at night as needed for muscle spasms 30 tablet 4   diclofenac Sodium  (VOLTAREN) 1 % GEL Apply 2 g topically 4 (four) times daily. To affected joint. 350 g 6   folic acid (FOLVITE) 1 MG tablet Take 1 mg by mouth daily.     furosemide (LASIX) 20 MG tablet TAKE 1 TABLET BY MOUTH DAILY FOR 3 DAYS. 30 tablet 0   Incontinence Supplies (EXTENSION TUBING/CONNECTOR) MISC Use with PEG feedings change every 5 days Mic-key extension 6 each 11   levETIRAcetam (KEPPRA) 100 MG/ML solution GIVE (500MG ) IN AM, (1000MG ) IN PM 473 mL 0   levothyroxine (SYNTHROID) 150 MCG tablet Place 150 mcg into feeding tube daily before breakfast.     NONFORMULARY OR COMPOUNDED ITEM Antifungal solution: Terbinafine 3%, Fluconazole 2%, Tea Tree Oil 5%, Urea 10%, Ibuprofen 2% in DMSO suspension #28mL 1 each 3   Nutritional Supplements (FEEDING SUPPLEMENT, OSMOLITE 1.2 CAL,) LIQD 1 can of Osmolite 1.2 bolus feeding daily Disp 30 day supply refill 1 year 237 mL 11   OXYGEN Inhale 3 L into the lungs as directed. With CPAP     phenytoin (DILANTIN) 125 MG/5ML suspension Take 53mL (100mg ) in AM, 72mL (200mg ) in PM 1080 mL 3   rivaroxaban (XARELTO) 20 MG TABS tablet Take 1 tablet (20 mg total) by mouth daily with supper. 30 tablet 5   Sennosides (SENNA) 8.8 MG/5ML SYRP TAKE 10 MLS BY MOUTH DAILY AS NEEDED 237 mL 1   simvastatin (ZOCOR) 40 MG tablet TAKE 1 TABLET (40 MG TOTAL) BY MOUTH AT BEDTIME. PLEASE KEEP UPCOMING APPT FOR FUTURE REFILLS. 90 tablet 3   sodium chloride (MURO 128) 5 % ophthalmic ointment Place 1 application into both eyes at bedtime.     SYRINGE DISPOSABLE 60CC 60 ML MISC 1 syringe per feeding three times a day 90 each 12   vitamin B-12 (CYANOCOBALAMIN) 100 MCG tablet Place 100 mcg into feeding tube daily.      arformoterol (BROVANA) 15 MCG/2ML NEBU Take 2 mLs (15 mcg total) by nebulization 2 (two) times daily. (Patient not taking: Reported on 04/14/2021) 120 mL 6   doxycycline (VIBRA-TABS) 100 MG tablet Take 1 tablet (100 mg total) by mouth 2 (two) times daily. 14 tablet 0   No  facility-administered medications prior to visit.    Review of Systems  Constitutional:  Negative for chills, diaphoresis, fever, malaise/fatigue and weight loss.  HENT:  Negative for congestion.   Respiratory:  Positive for cough and sputum production. Negative for hemoptysis, shortness of breath and wheezing.   Cardiovascular:  Negative for chest pain, palpitations and leg swelling.    Objective:   There were no vitals filed for this visit.      Physical Exam: General: Chronically ill-appearing, Down-syndrome features, no acute distress HENT: [place], AT Eyes: EOMI, no scleral icterus Respiratory: No respiratory distress Neuro: Intermittently engaged, nonverbal   Data Reviewed:  Imaging: CT Chest 09/21/19 - Bibasilar consolidation with air bronchograms CXR 04/08/21 - No infiltrate effusion or edema  PFT: None on file  Sleep Studies: PSG 09/10/15 -  AHI 12.8. 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 Recently treated for CAP/aspiration pneumonia as outpatient.  Acute pulmonary hypoxemia secondary to community-acquired pneumonia/aspiration - hypoxemia improved --Completed levaquin --CONTINUE supplemental oxygen for goal SpO2 >88% as needed  Aspiration/secretions --CONTINUE guaifenesin q4h as needed --ADD scopolamine patch  Asthma  --CONTINUE Pulmicort nebulizer TWICE a day --CONTINUE Brovana nebulizer TWICE a day --CONTINUE Duonebs nebulizer TWICE a day  --CONTINUE Albuterol four times a day as needed  OSA on BiPAP --CONTINUE BiPAP nightly and when napping  Goals of Care Followed by Palliative Care  Health Maintenance Immunization History  Administered Date(s) Administered   Influenza Inj Mdck Quad Pf 04/25/2019   Influenza Split 05/03/2016, 04/16/2017   Influenza, Quadrivalent,  Recombinant, Inj, Pf 04/25/2019   Influenza, Seasonal, Injecte, Preservative Fre 05/03/2016, 04/16/2017, 05/31/2017   Influenza,inj,Quad PF,6+ Mos 04/22/2017, 05/02/2018, 05/15/2020   Influenza-Unspecified 05/03/2016, 05/03/2019   Janssen (J&J) SARS-COV-2 Vaccination 11/04/2019   PFIZER(Purple Top)SARS-COV-2 Vaccination 09/02/2020   Pneumococcal Polysaccharide-23 07/19/2017   Pneumococcal-Unspecified 07/19/2017   Tdap 11/01/2014   CT Lung Screen - not qualified  No orders of the defined types were placed in this encounter.  Meds ordered this encounter  Medications   scopolamine (TRANSDERM SCOP, 1.5 MG,) 1 MG/3DAYS    Sig: Place 1 patch (1.5 mg total) onto the skin every 3 (three) days.    Dispense:  24 patch    Refill:  2   I have spent a total time of 33-minutes on the day of the appointment reviewing prior documentation, coordinating care and discussing medical diagnosis and plan with the patient/family. Past medical history, allergies, medications were reviewed. Pertinent imaging, labs and tests included in this note have been reviewed and interpreted independently by me.   No follow-ups on file.  Telitha Plath Mechele Collin, MD [facility] 04/14/2021 10:53 AM  Office Number [phone]

## 2021-04-18 ENCOUNTER — Telehealth: Payer: Self-pay | Admitting: Pulmonary Disease

## 2021-04-18 NOTE — Telephone Encounter (Signed)
PA request was received from [facility] [address] Phone: Fax:  Medication name and strength: scopolamine 1mg  patch Ordering Provider: JE  Was PA started with CMM?: Yes If yes, please enter KEY: [id number] Medication tried and failed:  Covered Alternatives:   PA sent to plan, time frame for approval / denial: 72 hrs

## 2021-04-21 NOTE — Telephone Encounter (Signed)
Approved. The requested medication is covered on the formulary with a Quantity Limit (QL) of <<10 patches>> per <<30>> days. Since this request is within the QL, it does not require a Coverage Determination Request. You filled this medication on <<04/18/2021>> for quantity of <<8 patches>> per <<24>> day supply at <>. Your next refill is on <<05/06/2021>>.  Nothing further needed since patient was able to pick up her patches on on 04/18/21.

## 2021-04-22 ENCOUNTER — Encounter (HOSPITAL_COMMUNITY): Payer: Self-pay | Admitting: *Deleted

## 2021-04-22 ENCOUNTER — Emergency Department (HOSPITAL_COMMUNITY)
Admission: EM | Admit: 2021-04-22 | Discharge: 2021-04-22 | Disposition: A | Discharge status: Home / Self Care | Payer: Medicare Other | Attending: Emergency Medicine | Admitting: Emergency Medicine

## 2021-04-22 ENCOUNTER — Emergency Department (HOSPITAL_COMMUNITY): Payer: Medicare Other

## 2021-04-22 DIAGNOSIS — J4550 Severe persistent asthma, uncomplicated: Secondary | ICD-10-CM | POA: Insufficient documentation

## 2021-04-22 DIAGNOSIS — E039 Hypothyroidism, unspecified: Secondary | ICD-10-CM | POA: Diagnosis not present

## 2021-04-22 DIAGNOSIS — Z7901 Long term (current) use of anticoagulants: Secondary | ICD-10-CM | POA: Insufficient documentation

## 2021-04-22 DIAGNOSIS — E871 Hypo-osmolality and hyponatremia: Secondary | ICD-10-CM | POA: Diagnosis present

## 2021-04-22 DIAGNOSIS — Z950 Presence of cardiac pacemaker: Secondary | ICD-10-CM | POA: Diagnosis not present

## 2021-04-22 DIAGNOSIS — G20 Parkinson's disease: Secondary | ICD-10-CM | POA: Diagnosis not present

## 2021-04-22 DIAGNOSIS — Z7951 Long term (current) use of inhaled steroids: Secondary | ICD-10-CM | POA: Diagnosis not present

## 2021-04-22 DIAGNOSIS — Z79899 Other long term (current) drug therapy: Secondary | ICD-10-CM | POA: Insufficient documentation

## 2021-04-22 LAB — BASIC METABOLIC PANEL
Anion gap: 7 (ref 5–15)
BUN: 11 mg/dL (ref 8–23)
CO2: 26 mmol/L (ref 22–32)
Calcium: 9 mg/dL (ref 8.9–10.3)
Chloride: 92 mmol/L — ABNORMAL LOW (ref 98–111)
Creatinine, Ser: 0.67 mg/dL (ref 0.44–1.00)
GFR, Estimated: >60 mL/min (ref >60)
Glucose, Bld: 95 mg/dL (ref 70–99)
Potassium: 4.6 mmol/L (ref 3.5–5.1)
Sodium: 125 mmol/L — ABNORMAL LOW (ref 135–145)

## 2021-04-22 LAB — CBC WITH DIFFERENTIAL/PLATELET
Abs Immature Granulocytes: 0.02 10*3/uL (ref 0.00–0.07)
Basophils Absolute: 0.1 10*3/uL (ref 0.0–0.1)
Basophils Relative: 2 %
Eosinophils Absolute: 0.1 10*3/uL (ref 0.0–0.5)
Eosinophils Relative: 3 %
HCT: 29.9 % — ABNORMAL LOW (ref 36.0–46.0)
Hemoglobin: 10.3 g/dL — ABNORMAL LOW (ref 12.0–15.0)
Immature Granulocytes: 1 %
Lymphocytes Relative: 23 %
Lymphs Abs: 0.8 10*3/uL (ref 0.7–4.0)
MCH: 34.1 pg — ABNORMAL HIGH (ref 26.0–34.0)
MCHC: 34.4 g/dL (ref 30.0–36.0)
MCV: 99 fL (ref 80.0–100.0)
Monocytes Absolute: 0.5 10*3/uL (ref 0.1–1.0)
Monocytes Relative: 15 %
Neutro Abs: 2 10*3/uL (ref 1.7–7.7)
Neutrophils Relative %: 56 %
Platelets: 77 10*3/uL — ABNORMAL LOW (ref 150–400)
RBC: 3.02 MIL/uL — ABNORMAL LOW (ref 3.87–5.11)
RDW: 14 % (ref 11.5–15.5)
WBC: 3.6 10*3/uL — ABNORMAL LOW (ref 4.0–10.5)
nRBC: 0 % (ref 0.0–0.2)

## 2021-04-22 MED ORDER — SODIUM CHLORIDE 0.9 % IV BOLUS
1000.0000 mL | Freq: Once | INTRAVENOUS | Status: AC
  Administered 2021-04-22: 1000 mL via INTRAVENOUS

## 2021-04-22 NOTE — ED Triage Notes (Signed)
BIBA Per EMS: Pt arrives from home with c/o low sodium. Pt nurse called last night and advised pt go to ER due to sodium being 121. Pt is nonverbal and bed bound. Pt hx of alzheimer's and parkinson's. Sister is main caregiver. Vitals WDL 120/45 95% RA 90 HR 24 RR

## 2021-04-22 NOTE — ED Provider Notes (Signed)
[facility]-EMERGENCY DEPT Provider Note   CSN: [id number] Arrival date & time: 04/22/21  0846     History Chief Complaint  Patient presents with   Low Sodium    Kaitlyn Powell is a 65 y.o. female.  65 Yo F with a chief complaints of hyponatremia.  Patient was seen in her doctor's office yesterday and had her sodium drawn and was 121.  Patient has not been sick per the family.  Is on her last day of treatment for an asymptomatic urinary tract infection.  About a week or so ago had an event where she lost consciousness of unknown etiology.  Has been seen by her pulmonologist in the past month with concern for change in her sputum.  Had normal testing performed there.  Difficult obtaining history as patient is nonverbal at baseline has Parkinson's and Down syndrome.  Other than ciprofloxacin denies any recent change in their medications.  Deny change in cough congestion or fever.  Is fed through a G-tube which they have been doing further normal protocol.  The history is provided by the patient.  Illness Severity:  Moderate Onset quality:  Gradual Duration:  1 week Timing:  Constant Progression:  Unchanged Chronicity:  New Associated symptoms: no chest pain, no congestion, no fever, no headaches, no myalgias, no nausea, no rhinorrhea, no shortness of breath, no vomiting and no wheezing       Past Medical History:  Diagnosis Date   Arthritis    Aspiration pneumonia (HCC)    Asthma    Cardiac arrhythmia    have pacemeaker   Down syndrome    DVT of popliteal vein (HCC) 09/08/2018   chronic   GERD (gastroesophageal reflux disease)    Heart block    Hypothyroidism    Mental retardation    Pacemaker    Parkinson's disease (HCC) 05/2019   Pulmonary emboli (HCC)    Seizures (HCC)    Sleep apnea with use of continuous positive airway pressure (CPAP)     Patient Active Problem List   Diagnosis Date Noted   Community acquired bacterial pneumonia 01/02/2021    Severe persistent asthma 05/30/2020   Left ankle pain 02/20/2020   Gastrostomy status (HCC)    Moderate malnutrition (HCC)    Acute hypoxemic respiratory failure (HCC)    Dysphagia    Advanced care planning/counseling discussion    Goals of care, counseling/discussion    Palliative care by specialist    Cellulitis of left hand 04/04/2019   Left hand pain 04/04/2019   Heart block AV complete (HCC) 01/19/2019   Chronic deep vein thrombosis (DVT) of right popliteal vein (HCC) 09/28/2018   Localization-related (focal) (partial) symptomatic epilepsy and epileptic syndromes with complex partial seizures, intractable, with status epilepticus (HCC) 09/07/2018   Dehydration 08/01/2018   Functional urinary incontinence 07/22/2017   OSA treated with BiPAP 12/31/2016   Mixed conductive and sensorineural hearing loss of both ears 08/05/2016   Seizures (HCC) 07/03/2016   Bilateral hearing loss 07/03/2016   Down syndrome 05/10/2016   Anemia due to medication 05/10/2016   Acquired hypothyroidism 05/10/2016   Elevated lipase 05/10/2016   History of pulmonary embolism 05/10/2016   G tube feedings (HCC) 05/10/2016   Pacemaker 05/10/2016   S/P percutaneous endoscopic gastrostomy (PEG) tube placement (HCC) 05/10/2016    Past Surgical History:  Procedure Laterality Date   IR GASTROSTOMY TUBE MOD SED  10/04/2019   IR RADIOLOGIST EVAL & MGMT  02/07/2020   IR RADIOLOGIST EVAL &  MGMT  02/28/2020   IR REPLC GASTRO/COLONIC TUBE PERCUT W/FLUORO  12/14/2019   IR REPLC GASTRO/COLONIC TUBE PERCUT W/FLUORO  01/04/2020   IR REPLC GASTRO/COLONIC TUBE PERCUT W/FLUORO  03/19/2020   PACEMAKER INSERTION  12/09/2015   [employer] Accolade MRI EL PPM implanted in [place] for complete heart block and syncope   PEG TUBE PLACEMENT     removed in 2017   TIBIA FRACTURE SURGERY Right      OB History   No obstetric history on file.     Family History  Problem Relation Age of Onset   Diabetes Father    Heart disease  Father    Breast cancer Sister     Social History   Tobacco Use   Smoking status: Never   Smokeless tobacco: Never  Vaping Use   Vaping Use: Never used  Substance Use Topics   Alcohol use: No   Drug use: No    Home Medications Prior to Admission medications   Medication Sig Start Date End Date Taking? Authorizing Provider  albuterol (PROVENTIL) (2.5 MG/3ML) 0.083% nebulizer solution Take 2.5 mg by nebulization every 6 (six) hours as needed for wheezing or shortness of breath.    [provider]  arformoterol (BROVANA) 15 MCG/2ML NEBU Take 2 mLs (15 mcg total) by nebulization 2 (two) times daily. Patient not taking: Reported on 04/14/2021 01/02/21   Luciano Cutter, MD  budesonide (PULMICORT) 0.5 MG/2ML nebulizer solution INHALE 1 VIAL BY NEBULIZATION IN THE MORNING AND AT BEDTIME. 08/14/20   Luciano Cutter, MD  cannabidiol (EPIDIOLEX) 100 MG/ML solution GIVE 3.7 ML BY MOUTH 2 TIMES A DAY. 04/14/21   Van Clines, MD  carbidopa-levodopa (SINEMET IR) 25-100 MG tablet PLACE 2 TABLETS INTO FEEDING TUBE 3 (THREE) TIMES DAILY. TAKE 2 TABLET THREE TIMES A DAY WITH MEALS 04/02/21   Van Clines, MD  cyclobenzaprine (FLEXERIL) 5 MG tablet Take 1/2 to 1 tablet at night as needed for muscle spasms 12/04/20   Van Clines, MD  diclofenac Sodium (VOLTAREN) 1 % GEL Apply 2 g topically 4 (four) times daily. To affected joint. 07/19/20   Judi Saa, DO  folic acid (FOLVITE) 1 MG tablet Take 1 mg by mouth daily. 02/03/20   [provider]  furosemide (LASIX) 20 MG tablet TAKE 1 TABLET BY MOUTH DAILY FOR 3 DAYS. 02/25/21   Luciano Cutter, MD  Incontinence Supplies (EXTENSION TUBING/CONNECTOR) MISC Use with PEG feedings change every 5 days Mic-key extension 04/18/20   Esterwood, Amy S, PA-C  levETIRAcetam (KEPPRA) 100 MG/ML solution GIVE (500MG ) IN AM, (1000MG ) IN PM 04/14/21   , MD  levothyroxine (SYNTHROID) 150 MCG tablet Place 150 mcg into feeding  tube daily before breakfast.    [provider]  NONFORMULARY OR COMPOUNDED ITEM Antifungal solution: Terbinafine 3%, Fluconazole 2%, Tea Tree Oil 5%, Urea 10%, Ibuprofen 2% in DMSO suspension #69mL 02/13/20   [place], 31m, DPM  Nutritional Supplements (FEEDING SUPPLEMENT, OSMOLITE 1.2 CAL,) LIQD 1 can of Osmolite 1.2 bolus feeding daily Disp 30 day supply refill 1 year 09/10/20   Lise Auer, MD  OXYGEN Inhale 3 L into the lungs as directed. With CPAP    [provider]  phenytoin (DILANTIN) 125 MG/5ML suspension Take 64mL (100mg ) in AM, 34mL (200mg ) in PM 02/05/21   , MD  rivaroxaban (XARELTO) 20 MG TABS tablet Take 1 tablet (20 mg total) by mouth daily with supper. 02/19/21  Hillis Range, MD  scopolamine (TRANSDERM SCOP, 1.5 MG,) 1 MG/3DAYS Place 1 patch (1.5 mg total) onto the skin every 3 (three) days. 04/14/21   Luciano Cutter, MD  Sennosides (SENNA) 8.8 MG/5ML SYRP TAKE 10 MLS BY MOUTH DAILY AS NEEDED 12/09/20   Napoleon Form, MD  simvastatin (ZOCOR) 40 MG tablet TAKE 1 TABLET (40 MG TOTAL) BY MOUTH AT BEDTIME. PLEASE KEEP UPCOMING APPT FOR FUTURE REFILLS. 01/13/21   Allred, Fayrene Fearing, MD  sodium chloride (MURO 128) 5 % ophthalmic ointment Place 1 application into both eyes at bedtime.    [provider]  SYRINGE DISPOSABLE 60CC 60 ML MISC 1 syringe per feeding three times a day 04/18/20   Esterwood, Amy S, PA-C  vitamin B-12 (CYANOCOBALAMIN) 100 MCG tablet Place 100 mcg into feeding tube daily.  12/26/19   [provider]  Omeprazole Magnesium (PRILOSEC) 10 MG PACK 20 mg by PEG Tube route daily before breakfast. 07/01/20 07/03/20  Napoleon Form, MD    Allergies    Cephalexin and Namenda [memantine hcl]  Review of Systems   Review of Systems  Constitutional:  Negative for chills and fever.  HENT:  Negative for congestion and rhinorrhea.   Eyes:  Negative for redness and visual disturbance.  Respiratory:  Negative for  shortness of breath and wheezing.   Cardiovascular:  Negative for chest pain and palpitations.  Gastrointestinal:  Negative for nausea and vomiting.  Genitourinary:  Negative for dysuria and urgency.  Musculoskeletal:  Negative for arthralgias and myalgias.  Skin:  Negative for pallor and wound.  Neurological:  Negative for dizziness and headaches.   Physical Exam Updated Vital Signs BP (!) 95/45   Pulse (!) 57   Temp 98.7 F (37.1 C) (Axillary) Comment: UTO Oral  Resp 18   SpO2 94%   Physical Exam Vitals and nursing note reviewed.  Constitutional:      General: She is not in acute distress.    Appearance: She is well-developed. She is not diaphoretic.  HENT:     Head: Normocephalic and atraumatic.     Comments: Down facies Eyes:     Pupils: Pupils are equal, round, and reactive to light.  Cardiovascular:     Rate and Rhythm: Normal rate and regular rhythm.     Heart sounds: No murmur heard.   No friction rub. No gallop.  Pulmonary:     Effort: Pulmonary effort is normal.     Breath sounds: No wheezing or rales.  Abdominal:     General: There is no distension.     Palpations: Abdomen is soft.     Tenderness: There is no abdominal tenderness.  Musculoskeletal:        General: No tenderness.     Cervical back: Normal range of motion and neck supple.     Comments: No appreciable edema  Skin:    General: Skin is warm and dry.  Neurological:     Mental Status: She is alert.     Comments: Non verbal  Psychiatric:        Behavior: Behavior normal.    ED Results / Procedures / Treatments   Labs (all labs ordered are listed, but only abnormal results are displayed) Labs Reviewed  CBC WITH DIFFERENTIAL/PLATELET - Abnormal; Notable for the following components:      Result Value   WBC 3.6 (*)    RBC 3.02 (*)    Hemoglobin 10.3 (*)    HCT 29.9 (*)    [facility]  34.1 (*)    Platelets 77 (*)    All other components within normal limits  BASIC METABOLIC PANEL - Abnormal;  Notable for the following components:   Sodium 125 (*)    Chloride 92 (*)    All other components within normal limits    EKG None  Radiology DG Chest Port 1 View  Result Date: 04/22/2021 CLINICAL DATA:  Cough, productive of sputum. EXAM: PORTABLE CHEST 1 VIEW COMPARISON:  April 08, 2021. FINDINGS: RIGHT-sided dual lead pacer device in similar position, power pack over the RIGHT chest. Leads project over the heart. Trachea midline. Cardiomediastinal contours are stable. Central pulmonary vascular congestion. No lobar consolidative process.  No pneumothorax. No sign of pleural effusion on frontal radiograph. EKG leads project over the chest. Wire from patient's mask reportedly over LEFT upper chest. On limited assessment there is no acute skeletal process. IMPRESSION: Central pulmonary vascular congestion. No acute cardiopulmonary disease. Electronically Signed   By: Donzetta Kohut M.D.   On: 04/22/2021 10:12    Procedures Procedures   Medications Ordered in ED Medications  sodium chloride 0.9 % bolus 1,000 mL (0 mLs Intravenous Stopped 04/22/21 1133)    ED Course  I have reviewed the triage vital signs and the nursing notes.  Pertinent labs & imaging results that were available during my care of the patient were reviewed by me and considered in my medical decision making (see chart for details).    MDM Rules/Calculators/A&P                           65 yo F with a significant past medical history of Down syndrome and is nonverbal at baseline here with hyponatremia.  Patient actually had lab work drawn by her pulmonologist about 2 weeks ago that showed a downward trending sodium level.  Reportedly her sodium is dropped 3 points since then.  Not drastically changed.  Difficult to assess for change in mental function.    The patient has had a few seizures in the past few months which is a bit of breakthrough since the past 6 mos or so.   Patient's sodium level is actually higher  than it was seen in the pulmonology office.  As she is still at her baseline do not feel admission or further intervention required here.  Discussed with the family who agree.  Will discharge home.  3:26 PM:  I have discussed the diagnosis/risks/treatment options with the patient and believe the pt to be eligible for discharge home to follow-up with PCP. We also discussed returning to the ED immediately if new or worsening sx occur. We discussed the sx which are most concerning (e.g., sudden worsening pain, fever, inability to tolerate by mouth) that necessitate immediate return. Medications administered to the patient during their visit and any new prescriptions provided to the patient are listed below.  Medications given during this visit Medications  sodium chloride 0.9 % bolus 1,000 mL (0 mLs Intravenous Stopped 04/22/21 1133)     The patient appears reasonably screen and/or stabilized for discharge and I doubt any other medical condition or other [facility] requiring further screening, evaluation, or treatment in the ED at this time prior to discharge.   Final Clinical Impression(s) / ED Diagnoses Final diagnoses:  Hyponatremia    Rx / DC Orders ED Discharge Orders     None        Melene Plan, DO 04/22/21 1526

## 2021-04-22 NOTE — ED Notes (Signed)
Family/Caregiver at bedside.

## 2021-04-22 NOTE — ED Notes (Signed)
Pt was provided perineal care, pt had no bowel movements throughout care.

## 2021-04-28 ENCOUNTER — Telehealth: Payer: Self-pay

## 2021-04-28 DIAGNOSIS — Z8701 Personal history of pneumonia (recurrent): Secondary | ICD-10-CM | POA: Insufficient documentation

## 2021-04-28 DIAGNOSIS — R0989 Other specified symptoms and signs involving the circulatory and respiratory systems: Secondary | ICD-10-CM | POA: Insufficient documentation

## 2021-04-28 NOTE — Telephone Encounter (Signed)
Appt made.   LMTCB to notify patient.

## 2021-04-28 NOTE — Telephone Encounter (Signed)
-----   Message from Chi Mechele Collin, MD sent at 04/28/2021  1:26 PM EDT ----- Regarding: Follow-up Please schedule for routine follow-up in 3 months

## 2021-04-30 ENCOUNTER — Ambulatory Visit: Payer: Medicare Other | Admitting: Family Medicine

## 2021-04-30 NOTE — Progress Notes (Deleted)
[facility] [address] [place] [address] Phone: [phone] Subjective:    I'm seeing this patient by the request  of:  Tracey Harries, MD  CC: neck pain   [id number]  Kaitlyn Powell is a 65 y.o. female coming in with history of down syndrome, dvt, parkinson's, seizures with  complaint of neck pain        Past Medical History:  Diagnosis Date   Arthritis    Aspiration pneumonia (HCC)    Asthma    Cardiac arrhythmia    have pacemeaker   Down syndrome    DVT of popliteal vein (HCC) 09/08/2018   chronic   GERD (gastroesophageal reflux disease)    Heart block    Hypothyroidism    Mental retardation    Pacemaker    Parkinson's disease (HCC) 05/2019   Pulmonary emboli (HCC)    Seizures (HCC)    Sleep apnea with use of continuous positive airway pressure (CPAP)    Past Surgical History:  Procedure Laterality Date   IR GASTROSTOMY TUBE MOD SED  10/04/2019   IR RADIOLOGIST EVAL & MGMT  02/07/2020   IR RADIOLOGIST EVAL & MGMT  02/28/2020   IR REPLC GASTRO/COLONIC TUBE PERCUT W/FLUORO  12/14/2019   IR REPLC GASTRO/COLONIC TUBE PERCUT W/FLUORO  01/04/2020   IR REPLC GASTRO/COLONIC TUBE PERCUT W/FLUORO  03/19/2020   PACEMAKER INSERTION  12/09/2015   [employer] Accolade MRI EL PPM implanted in [place] for complete heart block and syncope   PEG TUBE PLACEMENT     removed in 2017   TIBIA FRACTURE SURGERY Right    Social History   Socioeconomic History   Marital status: Single    Spouse name: Not on file   Number of children: Not on file   Years of education: Not on file   Highest education level: Not on file  Occupational History   Occupation: disabled  Tobacco Use   Smoking status: Never   Smokeless tobacco: Never  Vaping Use   Vaping Use: Never used  Substance and Sexual Activity   Alcohol use: No   Drug use: No   Sexual activity: Never  Other Topics Concern   Not on file  Social History Narrative   Pt is left handed    Lives in 2 story home with her sister, brother-in-law and BIL's brother (pt stays on bottom floor)   Social Determinants of Health   Financial Resource Strain: Not on file  Food Insecurity: Not on file  Transportation Needs: Not on file  Physical Activity: Not on file  Stress: Not on file  Social Connections: Not on file   Allergies  Allergen Reactions   Cephalexin Other (See Comments)    Seizures    Namenda [Memantine Hcl] Other (See Comments)    Made memory issues worse, sleepiness, increased confusion   Family History  Problem Relation Age of Onset   Diabetes Father    Heart disease Father    Breast cancer Sister     Current Outpatient Medications (Endocrine & Metabolic):    levothyroxine (SYNTHROID) 150 MCG tablet, Place 150 mcg into feeding tube daily before breakfast.  Current Outpatient Medications (Cardiovascular):    furosemide (LASIX) 20 MG tablet, TAKE 1 TABLET BY MOUTH DAILY FOR 3 DAYS.   simvastatin (ZOCOR) 40 MG tablet, TAKE 1 TABLET (40 MG TOTAL) BY MOUTH AT BEDTIME. PLEASE KEEP UPCOMING APPT FOR FUTURE REFILLS.  Current Outpatient Medications (Respiratory):    albuterol (PROVENTIL) (2.5 MG/3ML)  0.083% nebulizer solution, Take 2.5 mg by nebulization every 6 (six) hours as needed for wheezing or shortness of breath.   arformoterol (BROVANA) 15 MCG/2ML NEBU, Take 2 mLs (15 mcg total) by nebulization 2 (two) times daily. (Patient not taking: Reported on 04/14/2021)   budesonide (PULMICORT) 0.5 MG/2ML nebulizer solution, INHALE 1 VIAL BY NEBULIZATION IN THE MORNING AND AT BEDTIME.   Current Outpatient Medications (Hematological):    folic acid (FOLVITE) 1 MG tablet, Take 1 mg by mouth daily.   rivaroxaban (XARELTO) 20 MG TABS tablet, Take 1 tablet (20 mg total) by mouth daily with supper.   vitamin B-12 (CYANOCOBALAMIN) 100 MCG tablet, Place 100 mcg into feeding tube daily.   Current Outpatient Medications (Other):    cannabidiol (EPIDIOLEX) 100 MG/ML solution,  GIVE 3.7 ML BY MOUTH 2 TIMES A DAY.   carbidopa-levodopa (SINEMET IR) 25-100 MG tablet, PLACE 2 TABLETS INTO FEEDING TUBE 3 (THREE) TIMES DAILY. TAKE 2 TABLET THREE TIMES A DAY WITH MEALS   cyclobenzaprine (FLEXERIL) 5 MG tablet, Take 1/2 to 1 tablet at night as needed for muscle spasms   diclofenac Sodium (VOLTAREN) 1 % GEL, Apply 2 g topically 4 (four) times daily. To affected joint.   Incontinence Supplies (EXTENSION TUBING/CONNECTOR) MISC, Use with PEG feedings change every 5 days Mic-key extension   levETIRAcetam (KEPPRA) 100 MG/ML solution, GIVE (500MG ) IN AM, (1000MG ) IN PM   NONFORMULARY OR COMPOUNDED ITEM, Antifungal solution: Terbinafine 3%, Fluconazole 2%, Tea Tree Oil 5%, Urea 10%, Ibuprofen 2% in DMSO suspension #36mL   Nutritional Supplements (FEEDING SUPPLEMENT, OSMOLITE 1.2 CAL,) LIQD, 1 can of Osmolite 1.2 bolus feeding daily Disp 30 day supply refill 1 year   OXYGEN, Inhale 3 L into the lungs as directed. With CPAP   phenytoin (DILANTIN) 125 MG/5ML suspension, Take 3mL (100mg ) in AM, 74mL (200mg ) in PM   scopolamine (TRANSDERM SCOP, 1.5 MG,) 1 MG/3DAYS, Place 1 patch (1.5 mg total) onto the skin every 3 (three) days.   Sennosides (SENNA) 8.8 MG/5ML SYRP, TAKE 10 MLS BY MOUTH DAILY AS NEEDED   sodium chloride (MURO 128) 5 % ophthalmic ointment, Place 1 application into both eyes at bedtime.   SYRINGE DISPOSABLE 60CC 60 ML MISC, 1 syringe per feeding three times a day   Reviewed prior external information including notes and imaging from  primary care provider As well as notes that were available from care everywhere and other healthcare systems.  Past medical history, social, surgical and family history all reviewed in electronic medical record.  No pertanent information unless stated regarding to the chief complaint.   Review of Systems:  No headache, visual changes, nausea, vomiting, diarrhea, constipation, dizziness, abdominal pain, skin rash, fevers, chills, night  sweats, weight loss, swollen lymph nodes, body aches, joint swelling, chest pain, shortness of breath, mood changes. POSITIVE muscle aches  Objective  There were no vitals taken for this visit.   General: No apparent distress alert and oriented x3 mood and affect normal, dressed appropriately.  HEENT: Pupils equal, extraocular movements intact  Respiratory: Patient's speak in full sentences and does not appear short of breath  Cardiovascular: No lower extremity edema, non tender, no erythema  Gait normal with good balance and coordination.  MSK:       Impression and Recommendations:     The above documentation has been reviewed and is accurate and complete 11m, DO

## 2021-05-01 ENCOUNTER — Other Ambulatory Visit: Payer: Self-pay | Admitting: Neurology

## 2021-05-02 ENCOUNTER — Emergency Department (HOSPITAL_COMMUNITY): Payer: Medicare Other

## 2021-05-02 ENCOUNTER — Emergency Department (HOSPITAL_COMMUNITY)
Admission: EM | Admit: 2021-05-02 | Discharge: 2021-05-02 | Disposition: A | Discharge status: Home / Self Care | Payer: Medicare Other | Attending: Emergency Medicine | Admitting: Emergency Medicine

## 2021-05-02 ENCOUNTER — Other Ambulatory Visit: Payer: Self-pay

## 2021-05-02 ENCOUNTER — Encounter (HOSPITAL_COMMUNITY): Payer: Self-pay | Admitting: Emergency Medicine

## 2021-05-02 DIAGNOSIS — R609 Edema, unspecified: Secondary | ICD-10-CM | POA: Insufficient documentation

## 2021-05-02 DIAGNOSIS — R109 Unspecified abdominal pain: Secondary | ICD-10-CM | POA: Diagnosis not present

## 2021-05-02 DIAGNOSIS — E039 Hypothyroidism, unspecified: Secondary | ICD-10-CM | POA: Diagnosis not present

## 2021-05-02 DIAGNOSIS — Z7951 Long term (current) use of inhaled steroids: Secondary | ICD-10-CM | POA: Insufficient documentation

## 2021-05-02 DIAGNOSIS — Z950 Presence of cardiac pacemaker: Secondary | ICD-10-CM | POA: Insufficient documentation

## 2021-05-02 DIAGNOSIS — R0602 Shortness of breath: Secondary | ICD-10-CM | POA: Insufficient documentation

## 2021-05-02 DIAGNOSIS — Z20822 Contact with and (suspected) exposure to covid-19: Secondary | ICD-10-CM | POA: Insufficient documentation

## 2021-05-02 DIAGNOSIS — Z7901 Long term (current) use of anticoagulants: Secondary | ICD-10-CM | POA: Insufficient documentation

## 2021-05-02 DIAGNOSIS — N390 Urinary tract infection, site not specified: Secondary | ICD-10-CM

## 2021-05-02 DIAGNOSIS — R600 Localized edema: Secondary | ICD-10-CM

## 2021-05-02 DIAGNOSIS — G20 Parkinson's disease: Secondary | ICD-10-CM | POA: Diagnosis not present

## 2021-05-02 DIAGNOSIS — J45909 Unspecified asthma, uncomplicated: Secondary | ICD-10-CM | POA: Diagnosis not present

## 2021-05-02 DIAGNOSIS — M549 Dorsalgia, unspecified: Secondary | ICD-10-CM

## 2021-05-02 LAB — CBC WITH DIFFERENTIAL/PLATELET
Abs Immature Granulocytes: 0.03 10*3/uL (ref 0.00–0.07)
Basophils Absolute: 0 10*3/uL (ref 0.0–0.1)
Basophils Relative: 1 %
Eosinophils Absolute: 0 10*3/uL (ref 0.0–0.5)
Eosinophils Relative: 1 %
HCT: 27.5 % — ABNORMAL LOW (ref 36.0–46.0)
Hemoglobin: 9.3 g/dL — ABNORMAL LOW (ref 12.0–15.0)
Immature Granulocytes: 1 %
Lymphocytes Relative: 16 %
Lymphs Abs: 0.8 10*3/uL (ref 0.7–4.0)
MCH: 34.2 pg — ABNORMAL HIGH (ref 26.0–34.0)
MCHC: 33.8 g/dL (ref 30.0–36.0)
MCV: 101.1 fL — ABNORMAL HIGH (ref 80.0–100.0)
Monocytes Absolute: 0.7 10*3/uL (ref 0.1–1.0)
Monocytes Relative: 15 %
Neutro Abs: 3.3 10*3/uL (ref 1.7–7.7)
Neutrophils Relative %: 66 %
Platelets: 95 10*3/uL — ABNORMAL LOW (ref 150–400)
RBC: 2.72 MIL/uL — ABNORMAL LOW (ref 3.87–5.11)
RDW: 14.3 % (ref 11.5–15.5)
WBC: 4.9 10*3/uL (ref 4.0–10.5)
nRBC: 0 % (ref 0.0–0.2)

## 2021-05-02 LAB — URINALYSIS, ROUTINE W REFLEX MICROSCOPIC
Bilirubin Urine: NEGATIVE
Bilirubin Urine: NEGATIVE
Glucose, UA: NEGATIVE mg/dL
Glucose, UA: NEGATIVE mg/dL
Hgb urine dipstick: NEGATIVE
Hgb urine dipstick: NEGATIVE
Ketones, ur: 5 mg/dL — AB
Ketones, ur: NEGATIVE mg/dL
Nitrite: POSITIVE — AB
Nitrite: POSITIVE — AB
Protein, ur: 30 mg/dL — AB
Protein, ur: NEGATIVE mg/dL
Specific Gravity, Urine: 1.013 (ref 1.005–1.030)
Specific Gravity, Urine: 1.015 (ref 1.005–1.030)
WBC, UA: >50 WBC/hpf — ABNORMAL HIGH (ref 0–5)
WBC, UA: >50 WBC/hpf — ABNORMAL HIGH (ref 0–5)
pH: 7 (ref 5.0–8.0)
pH: 7 (ref 5.0–8.0)

## 2021-05-02 LAB — COMPREHENSIVE METABOLIC PANEL
ALT: 9 U/L (ref 0–44)
AST: 19 U/L (ref 15–41)
Albumin: 2.8 g/dL — ABNORMAL LOW (ref 3.5–5.0)
Alkaline Phosphatase: 86 U/L (ref 38–126)
Anion gap: 9 (ref 5–15)
BUN: 13 mg/dL (ref 8–23)
CO2: 26 mmol/L (ref 22–32)
Calcium: 8.8 mg/dL — ABNORMAL LOW (ref 8.9–10.3)
Chloride: 94 mmol/L — ABNORMAL LOW (ref 98–111)
Creatinine, Ser: 0.6 mg/dL (ref 0.44–1.00)
GFR, Estimated: >60 mL/min (ref >60)
Glucose, Bld: 101 mg/dL — ABNORMAL HIGH (ref 70–99)
Potassium: 4.5 mmol/L (ref 3.5–5.1)
Sodium: 129 mmol/L — ABNORMAL LOW (ref 135–145)
Total Bilirubin: 0.5 mg/dL (ref 0.3–1.2)
Total Protein: 7.2 g/dL (ref 6.5–8.1)

## 2021-05-02 LAB — PHENYTOIN LEVEL, TOTAL: Phenytoin Lvl: 8.1 ug/mL — ABNORMAL LOW (ref 10.0–20.0)

## 2021-05-02 LAB — RESP PANEL BY RT-PCR (FLU A&B, COVID) ARPGX2
Influenza A by PCR: NEGATIVE
Influenza B by PCR: NEGATIVE
SARS Coronavirus 2 by RT PCR: NEGATIVE

## 2021-05-02 LAB — LACTIC ACID, PLASMA
Lactic Acid, Venous: 0.9 mmol/L (ref 0.5–1.9)
Lactic Acid, Venous: 0.8 mmol/L (ref 0.5–1.9)

## 2021-05-02 LAB — TROPONIN I (HIGH SENSITIVITY)
Troponin I (High Sensitivity): 15 ng/L (ref <18)
Troponin I (High Sensitivity): 15 ng/L (ref <18)

## 2021-05-02 LAB — BRAIN NATRIURETIC PEPTIDE: B Natriuretic Peptide: 165.5 pg/mL — ABNORMAL HIGH (ref 0.0–100.0)

## 2021-05-02 MED ORDER — CIPROFLOXACIN HCL 500 MG PO TABS: 500.0000 mg | ORAL_TABLET | Freq: Two times a day (BID) | ORAL | 0 refills | Status: DC

## 2021-05-02 MED ORDER — IOHEXOL 350 MG/ML SOLN
80.0000 mL | Freq: Once | INTRAVENOUS | Status: AC | PRN
  Administered 2021-05-02: 80 mL via INTRAVENOUS

## 2021-05-02 MED ORDER — CIPROFLOXACIN IN D5W 400 MG/200ML IV SOLN
400.0000 mg | Freq: Once | INTRAVENOUS | Status: AC
  Administered 2021-05-02: 400 mg via INTRAVENOUS
  Filled 2021-05-02: qty 200

## 2021-05-02 MED ORDER — SODIUM CHLORIDE 0.9 % IV BOLUS
500.0000 mL | Freq: Once | INTRAVENOUS | Status: AC
  Administered 2021-05-02: 500 mL via INTRAVENOUS

## 2021-05-02 MED ORDER — IOHEXOL 350 MG/ML SOLN: 100.0000 mL | Freq: Once | INTRAVENOUS | Status: DC | PRN

## 2021-05-02 MED ORDER — SODIUM CHLORIDE 0.9 % IV SOLN: INTRAVENOUS | Status: DC

## 2021-05-02 MED ORDER — SODIUM CHLORIDE 0.9 % IV SOLN
500.0000 mg | Freq: Once | INTRAVENOUS | Status: AC
  Administered 2021-05-02: 500 mg via INTRAVENOUS
  Filled 2021-05-02: qty 10

## 2021-05-02 NOTE — ED Triage Notes (Signed)
BIB EMS from home, complains of SOB since this Monday (9/26) since a witnessed seizure on 9/26. Baseline CPAP at night for OSA, sats in mid 80s in daytime, normally RA. Has been using nasal cannula at home to assit w/ oxygenation. Also states no BM in 4 days, has used sennacot w/o movement. Also pain in upper back with movement. EMS gave 2 DuoNebs, pt was 90% on NRB, w/ decreased movement and wheezing. Compliance w/ seizure meds, last seizure approx 5 years ago.

## 2021-05-02 NOTE — Discharge Instructions (Addendum)
It is not clear what is causing her trouble breathing, or leg edema.  She does not appear to be in acute heart failure at this time.  Urinalysis indicates infection.  A urine culture was sent and we started her on antibiotic, ciprofloxacin.  Treat her with Tylenol, 650 mg, every 4 hours while awake.  Do this until she is finished with the antibiotic medication.  Follow-up with her doctor for a checkup next week.  Consider getting a geriatric doctor to help her with her condition.  Follow her oxygen status and use her oxygen as needed to keep sats above 90%.  Return here, if needed.

## 2021-05-02 NOTE — ED Notes (Signed)
[place] IV attempted without success. IV team consult placed.

## 2021-05-02 NOTE — ED Provider Notes (Signed)
[facility] LONG EMERGENCY DEPARTMENT Provider Note  CSN: [id number] Arrival date & time: 05/02/21 1217    History No chief complaint on file.   Kaitlyn Powell is a 65 y.o. female with history of Down Syndrome, brought to the ED via EMS from home where she lives with her older sister and brother-in-law who are her primary caregivers. Sister provides history as patient is non-verbal. Per sister patient had labs done at a routine doctor's visit about 2 weeks ago showing hyponatremia, seen in the ED on 9/20 for same, Na 125 then (improved from 121) and so ultimately discharged home. She has been getting increased Na in her feeding tube since then, scheduled for recheck next week. She had a seizure on 9/26, lasted for several minutes then resolved. She is on seizure medications already. Since that time, when transitioning her to and from wheelchair she has been grunting and seems to be in pain, unclear where this is coming from but sister suspects it is her upper back that is bothering her. She has also been having increased trouble breathing over the last several days. She usually wears CPAP at night but is on RA during the day. She has had SpO2 drop into the 80s so the sister has had her on [place] oxygen for a few days with good improvement. She has not had a fever during that time. Sister reports she last had a BM on 9-26, has not had results with stool softeners or enema and seems to be having some abdominal discomfort as well. Treated for UTI about 3 weeks ago with Cipro. Sister has also noticed some edema of her leg which is greater than typical for her.    Past Medical History:  Diagnosis Date   Arthritis    Aspiration pneumonia (HCC)    Asthma    Cardiac arrhythmia    have pacemeaker   Down syndrome    DVT of popliteal vein (HCC) 09/08/2018   chronic   GERD (gastroesophageal reflux disease)    Heart block    Hypothyroidism    Mental retardation    Pacemaker    Parkinson's disease (HCC)  05/2019   Pulmonary emboli (HCC)    Seizures (HCC)    Sleep apnea with use of continuous positive airway pressure (CPAP)     Past Surgical History:  Procedure Laterality Date   IR GASTROSTOMY TUBE MOD SED  10/04/2019   IR RADIOLOGIST EVAL & MGMT  02/07/2020   IR RADIOLOGIST EVAL & MGMT  02/28/2020   IR REPLC GASTRO/COLONIC TUBE PERCUT W/FLUORO  12/14/2019   IR REPLC GASTRO/COLONIC TUBE PERCUT W/FLUORO  01/04/2020   IR REPLC GASTRO/COLONIC TUBE PERCUT W/FLUORO  03/19/2020   PACEMAKER INSERTION  12/09/2015   [employer] Accolade MRI EL PPM implanted in [place] for complete heart block and syncope   PEG TUBE PLACEMENT     removed in 2017   TIBIA FRACTURE SURGERY Right     Family History  Problem Relation Age of Onset   Diabetes Father    Heart disease Father    Breast cancer Sister     Social History   Tobacco Use   Smoking status: Never   Smokeless tobacco: Never  Vaping Use   Vaping Use: Never used  Substance Use Topics   Alcohol use: No   Drug use: No     Home Medications Prior to Admission medications   Medication Sig Start Date End Date Taking? Authorizing Provider  albuterol (PROVENTIL) (2.5 MG/3ML) 0.083% nebulizer  solution Take 2.5 mg by nebulization every 6 (six) hours as needed for wheezing or shortness of breath.    [provider]  arformoterol (BROVANA) 15 MCG/2ML NEBU Take 2 mLs (15 mcg total) by nebulization 2 (two) times daily. Patient not taking: Reported on 04/14/2021 01/02/21   Luciano Cutter, MD  budesonide (PULMICORT) 0.5 MG/2ML nebulizer solution INHALE 1 VIAL BY NEBULIZATION IN THE MORNING AND AT BEDTIME. 08/14/20   Luciano Cutter, MD  cannabidiol (EPIDIOLEX) 100 MG/ML solution GIVE 3.7 ML BY MOUTH 2 TIMES A DAY. 04/14/21   Van Clines, MD  carbidopa-levodopa (SINEMET IR) 25-100 MG tablet PLACE 2 TABLETS INTO FEEDING TUBE 3 (THREE) TIMES DAILY. TAKE 2 TABLET THREE TIMES A DAY WITH MEALS 04/02/21   Van Clines, MD  cyclobenzaprine  (FLEXERIL) 5 MG tablet Take 1/2 to 1 tablet at night as needed for muscle spasms 12/04/20   Van Clines, MD  diclofenac Sodium (VOLTAREN) 1 % GEL Apply 2 g topically 4 (four) times daily. To affected joint. 07/19/20   Judi Saa, DO  folic acid (FOLVITE) 1 MG tablet Take 1 mg by mouth daily. 02/03/20   [provider]  furosemide (LASIX) 20 MG tablet TAKE 1 TABLET BY MOUTH DAILY FOR 3 DAYS. 02/25/21   Luciano Cutter, MD  Incontinence Supplies (EXTENSION TUBING/CONNECTOR) MISC Use with PEG feedings change every 5 days Mic-key extension 04/18/20   Esterwood, Amy S, PA-C  levETIRAcetam (KEPPRA) 100 MG/ML solution GIVE (500MG ) IN AM, (1000MG ) IN PM 05/02/21   , MD  levothyroxine (SYNTHROID) 150 MCG tablet Place 150 mcg into feeding tube daily before breakfast.    [provider]  NONFORMULARY OR COMPOUNDED ITEM Antifungal solution: Terbinafine 3%, Fluconazole 2%, Tea Tree Oil 5%, Urea 10%, Ibuprofen 2% in DMSO suspension #39mL 02/13/20   [place], 31m, DPM  Nutritional Supplements (FEEDING SUPPLEMENT, OSMOLITE 1.2 CAL,) LIQD 1 can of Osmolite 1.2 bolus feeding daily Disp 30 day supply refill 1 year 09/10/20   Lise Auer, MD  OXYGEN Inhale 3 L into the lungs as directed. With CPAP    [provider]  phenytoin (DILANTIN) 125 MG/5ML suspension Take 16mL (100mg ) in AM, 63mL (200mg ) in PM 02/05/21   , MD  rivaroxaban (XARELTO) 20 MG TABS tablet Take 1 tablet (20 mg total) by mouth daily with supper. 02/19/21   Allred, , MD  scopolamine (TRANSDERM SCOP, 1.5 MG,) 1 MG/3DAYS Place 1 patch (1.5 mg total) onto the skin every 3 (three) days. 04/14/21   Van Clines, MD  Sennosides (SENNA) 8.8 MG/5ML SYRP TAKE 10 MLS BY MOUTH DAILY AS NEEDED 12/09/20   Fayrene Fearing, MD  simvastatin (ZOCOR) 40 MG tablet TAKE 1 TABLET (40 MG TOTAL) BY MOUTH AT BEDTIME. PLEASE KEEP UPCOMING APPT FOR FUTURE REFILLS. 01/13/21   Allred, Luciano Cutter, MD   sodium chloride (MURO 128) 5 % ophthalmic ointment Place 1 application into both eyes at bedtime.    [provider]  SYRINGE DISPOSABLE 60CC 60 ML MISC 1 syringe per feeding three times a day 04/18/20   Esterwood, Amy S, PA-C  vitamin B-12 (CYANOCOBALAMIN) 100 MCG tablet Place 100 mcg into feeding tube daily.  12/26/19   [provider]  Omeprazole Magnesium (PRILOSEC) 10 MG PACK 20 mg by PEG Tube route daily before breakfast. 07/01/20 07/03/20  04/20/20, MD     Allergies    Cephalexin and Namenda [memantine hcl]  Review of Systems   Review of Systems Unable to assess due to mental status.    Physical Exam BP 120/72 (BP Location: Left Arm)   Pulse 89   Temp 98.7 F (37.1 C) (Oral)   Resp (!) 22   Ht 4\' 11"  (1.499 m)   Wt 73 kg   SpO2 97%   BMI 32.51 kg/m   Physical Exam Vitals and nursing note reviewed.  Constitutional:      Appearance: Normal appearance.  HENT:     Head: Normocephalic and atraumatic.     Nose: Nose normal.     Mouth/Throat:     Mouth: Mucous membranes are moist.  Eyes:     Extraocular Movements: Extraocular movements intact.     Conjunctiva/sclera: Conjunctivae normal.  Cardiovascular:     Rate and Rhythm: Normal rate.  Pulmonary:     Effort: Pulmonary effort is normal.     Breath sounds: Rhonchi present.  Abdominal:     General: Abdomen is flat.     Palpations: Abdomen is soft.     Tenderness: There is abdominal tenderness (mild diffuse). There is no guarding.  Musculoskeletal:        General: Normal range of motion.     Cervical back: Neck supple.     Right lower leg: Edema (trace) present.     Left lower leg: Edema (trace) present.  Skin:    General: Skin is warm and dry.  Psychiatric:        Mood and Affect: Mood normal.     ED Results / Procedures / Treatments   Labs (all labs ordered are listed, but only abnormal results are displayed) Labs Reviewed  COMPREHENSIVE METABOLIC PANEL  CBC WITH  DIFFERENTIAL/PLATELET  URINALYSIS, ROUTINE W REFLEX MICROSCOPIC  BRAIN NATRIURETIC PEPTIDE  LACTIC ACID, PLASMA  LACTIC ACID, PLASMA  TROPONIN I (HIGH SENSITIVITY)    EKG EKG Interpretation  Date/Time:  Friday May 02 2021 12:44:59 EDT Ventricular Rate:  91 PR Interval:  196 QRS Duration: 97 QT Interval:  369 QTC Calculation: 454 R Axis:   82 Text Interpretation: Sinus rhythm Low voltage, precordial leads Consider right ventricular hypertrophy Borderline T abnormalities, inferior leads Since last tracing Rate faster Confirmed by 01-23-1970 [phone]) on 05/02/2021 1:18:29 PM  Radiology No results found.  Procedures Procedures  Medications Ordered in the ED Medications - No data to display   MDM Rules/Calculators/A&P MDM Patient with complex PMH here with several concerns by sister. Will check labs, CXR, UA and likely advanced imaging of chest (to eval PE) and abdomen (to eval pain) if labs are amenable. Patient overall looks comfortable on [place] oxygen.   ED Course  I have reviewed the triage vital signs and the nursing notes.  Pertinent labs & imaging results that were available during my care of the patient were reviewed by me and considered in my medical decision making (see chart for details).  Clinical Course as of 05/02/21 1650  Fri May 02, 2021  1413 CXR without acute change [CS]  1649 Delay in getting labs drawn due to difficult stick. Care of the patient signed out to Dr. May 04, 2021 at the change of shift pending labs, consider CTA PE and/or CT abd/pel when labs result.  [CS]    Clinical Course User Index [CS] Effie Shy, MD    Final Clinical Impression(s) / ED Diagnoses Final diagnoses:  SOB (shortness of breath)    Rx / DC Orders ED Discharge Orders     None  Pollyann Savoy, MD 05/02/21 [phone]

## 2021-05-02 NOTE — ED Provider Notes (Signed)
4:50 PM-checkout from Dr. Bernette Mayers to evaluate patient after return of labs and imaging.  Patient may need to be admitted for shortness of breath.  She is apparently chronically on oxygen, but uses it only at nighttime when using CPAP, but more short of breath today.  She arrives by EMS, after being found hypoxic at home.  Clinical Course as of 05/03/21 1122  Fri May 02, 2021  1413 CXR without acute change [CS]  1649 Delay in getting labs drawn due to difficult stick. Care of the patient signed out to Dr. Effie Shy at the change of shift pending labs, consider CTA PE and/or CT abd/pel when labs result.  [CS]  1702 Multiple issues: -seizure -decreased sodium -pedal edema, using Lasix -pain with mvt. Possibly upper back -"belly breathing and low sats 85%" - PCP Everlene Other  [EW]    Clinical Course User Index [CS] Pollyann Savoy, MD [EW] Mancel Bale, MD    Patient Vitals for the past 24 hrs:  BP Temp Temp src Pulse Resp SpO2 Height Weight  05/02/21 2230 118/84 100 F (37.8 C) Oral 73 18 100 % -- --  05/02/21 2200 128/67 -- -- 82 19 100 % -- --  05/02/21 2130 (!) 141/78 -- -- 82 20 95 % -- --  05/02/21 2100 129/60 100 F (37.8 C) Rectal 78 18 98 % -- --  05/02/21 2030 128/68 -- -- 83 18 99 % -- --  05/02/21 1930 (!) 105/55 -- -- (!) 51 14 96 % -- --  05/02/21 1900 (!) 121/98 -- -- 70 19 96 % -- --  05/02/21 1830 128/68 -- -- 85 18 100 % -- --  05/02/21 1730 124/61 -- -- 82 15 99 % -- --  05/02/21 1700 126/65 -- -- 84 18 98 % -- --  05/02/21 1625 (!) 122/58 -- -- 85 17 96 % -- --  05/02/21 1533 116/64 -- -- 88 16 98 % -- --  05/02/21 1430 121/76 -- -- 83 12 100 % -- --  05/02/21 1330 (!) 119/59 -- -- 90 18 100 % -- --  05/02/21 1300 120/66 -- -- 97 17 99 % -- --  05/02/21 1233 120/72 98.7 F (37.1 C) Oral 89 (!) 22 97 % -- --  05/02/21 1230 -- -- -- -- -- -- 4\' 11"  (1.499 m) 73 kg    10:15 PM reevaluation with update and discussion. After initial assessment and treatment, an  updated evaluation reveals clinically stable at this time.  Findings discussed with patient's sister who was at the bedside, all questions were answered.   Medical Decision Making:  This patient is presenting for evaluation of shortness of breath, which does require a range of treatment options, and is a complaint that involves a high risk of morbidity and mortality. The differential diagnoses include acute illness, chronic respiratory distress with exacerbation, complication from seizure, Down syndrome. I decided to review old records, and in summary elderly female, living with family members, decompensated at baseline and worsening over the last couple of weeks..  I obtained additional historical information from sister and brother-in-law at bedside.  Clinical Laboratory Tests Ordered, included CBC, Metabolic panel, Urinalysis, and BMP, Dilantin level, lactic acid, troponin . Review indicates normal except BNP high, phenytoin level low, hemoglobin low, this is indicative of infection, sodium low, chloride low, glucose high, calcium low, albumin low. Radiologic Tests Ordered, included CT angio chest, CT abdomen, chest x-ray.  I independently Visualized: Radiographic images, which show no acute  abnormalities  Cardiac Monitor Tracing which shows normal sinus rhythm   Critical Interventions-clinical evaluation, laboratory testing, radiography, observation, urinary catheterization for clean sample, to assess for UTI and culture for suspected acute infection.  After These Interventions, the Patient was reevaluated and was found with acute and chronic illnesses, debilitated state and progressive illness.  Patient requires advanced care which she is currently being given in her home setting by her sophisticated caregivers.  She has some home services, and this is apparently advancing, gradually.  She is followed closely by her PCP but has difficulty getting to his office for in person  evaluations.  There are barriers to receiving health care at home including lack of a ramp to get in and out of the home.  Patient will likely need progressive care at home or change in care setting to more skilled in a facility.  Currently family members have elected to not do that.  She does not meet criteria for requirement of hospitalization.  She likely has UTI without signs of severe sepsis.  I doubt that she is in acute congestive heart failure.  Last cardiac echo was done in 2020 and showed normal EF.  I discussed need for close outpatient evaluation with the family members were agreeable to this plan.  CRITICAL CARE-yes Performed by: Mancel Bale  .Critical Care Performed by: Mancel Bale, MD Authorized by: Mancel Bale, MD   Critical care provider statement:    Critical care time (minutes):  50   Critical care start time:  05/02/2021 4:45 PM   Critical care end time:  05/02/2021 10:30 PM   Critical care time was exclusive of:  Separately billable procedures and treating other patients   Critical care was time spent personally by me on the following activities:  Blood draw for specimens, development of treatment plan with patient or surrogate, discussions with consultants, evaluation of patient's response to treatment, examination of patient, obtaining history from patient or surrogate, ordering and performing treatments and interventions, ordering and review of laboratory studies, pulse oximetry, re-evaluation of patient's condition, review of old charts and ordering and review of radiographic studies   Nursing Notes Reviewed/ Care Coordinated Applicable Imaging Reviewed Interpretation of Laboratory Data incorporated into ED treatment  The patient appears reasonably screened and/or stabilized for discharge and I doubt any other medical condition or other [facility] requiring further screening, evaluation, or treatment in the ED at this time prior to discharge.  Plan: Home  Medications-continue current; Home Treatments-supportive care; return here if the recommended treatment, does not improve the symptoms; Recommended follow up-PCP follow-up as soon as possible.  Consider geriatric care management.         Mancel Bale, MD 05/03/21 [phone]

## 2021-05-02 NOTE — ED Notes (Signed)
300 mL revealed via bladder scan.

## 2021-05-02 NOTE — ED Notes (Addendum)
IV team consult placed due to difficult stick.

## 2021-05-05 ENCOUNTER — Telehealth: Payer: Self-pay | Admitting: Gastroenterology

## 2021-05-05 ENCOUNTER — Other Ambulatory Visit: Payer: Self-pay

## 2021-05-05 LAB — URINE CULTURE: Culture: >=100000 — AB

## 2021-05-05 MED ORDER — SENNA 8.8 MG/5ML PO SYRP: ORAL_SOLUTION | ORAL | 1 refills | Status: AC

## 2021-05-05 NOTE — Telephone Encounter (Signed)
Patient has had her bowel movement. This occurred after [facility] call for recommendations. She went the week without any stool. Over the course of the week, various things were tried. Usually after 1 or 2 days without a bowel movement, Senna or Smooth Move Tea "will do the trick." The next was glycerin suppositories, an enema and finally MOM. The patient was in the ER for SOB 05/02/21. CT did not show an obstruction, but no intervention was done for the constipation.  [name] is alarmed because this has never happened before. Asks if the Parkinson's could be causing this?

## 2021-05-05 NOTE — Telephone Encounter (Signed)
Inbound call from patient's sister stating patient has not had a bowel movement for the past week.  Has tried different otc medications but have not helped.  Please advise.

## 2021-05-05 NOTE — Telephone Encounter (Signed)
Spoke with the guardians of the patient. We reviewed the recommendations for going forward.  The patient has not had any further bowel movement since this morning. She is belching a lot. Mr Kieth Brightly reports the patient's abdomen is hard. Concerned that the bowels are still very full. Further reports the patient had a seizure on Monday 04/28/21. She has not seemed to swallow as well since then. Not coughing or choking. All foods are soft. The CT that was done at the hospital over the weekend does not mention constipation.  Sorry to send this piecemeal. These are concerns they thought of since the earlier conversation.

## 2021-05-05 NOTE — Telephone Encounter (Signed)
Please advise to use regular bowel regimen every day with stool softener Docusate 1tablets or Miralax 1 capful twice daily and use senna or MOM as needed. Can use dulcolax suppositories as needed if she has severe constipation. Thanks

## 2021-05-06 ENCOUNTER — Telehealth: Payer: Self-pay

## 2021-05-06 NOTE — Telephone Encounter (Signed)
GI symptoms are secondary to decreased p.o. intake, activity, Parkinson's, seizures and changes in her mental status.  Continue with bowel regimen as outlined earlier.  Okay to use MiraLAX bowel purge as needed once every few weeks if she has significant constipation.  Thank you

## 2021-05-06 NOTE — Telephone Encounter (Signed)
Post ED Visit - Positive Culture Follow-up  Culture report reviewed by antimicrobial stewardship pharmacist: [facility] []  , Pharm.D. []  Enzo Bi, Pharm.D., BCPS AQ-ID []  , Pharm.D., BCPS []  Celedonio Miyamoto, Pharm.D., BCPS []  [place], Garvin Fila.D., BCPS, AAHIVP []  , Pharm.D., BCPS, AAHIVP []  Georgina Pillion, PharmD, BCPS []  , PharmD, BCPS []  [place], PharmD, BCPS []  [address], PharmD []  , PharmD, BCPS []  Estella Husk, PharmD  Pharmacy Team []  Lysle Pearl, PharmD []  , PharmD []  Phillips Climes, PharmD []  , Rph []  Agapito Games) , PharmD []  Verlan Friends, PharmD []  , PharmD []  Mervyn Gay, PharmD []  , PharmD []  Vinnie Level, PharmD []  [facility], PharmD []  , PharmD []  Len Childs, PharmD   Positive urine culture Treated with ciprofloxacin HCL, organism sensitive to the same and no further patient follow-up is required at this time.  05/06/2021, 11:53 AM

## 2021-05-07 ENCOUNTER — Telehealth: Payer: Self-pay | Admitting: Neurology

## 2021-05-07 NOTE — Telephone Encounter (Signed)
Pt had seizure on 04/28/21. She seems to have several changes since then and had to go to the ED. [name] would like a sooner appointment or a quick phone call to try and understand the changes.

## 2021-05-07 NOTE — Telephone Encounter (Signed)
Seized 715 on 04/28/2021, no clue how long she was like that, took patient to ER 05/02/2021, After the seizure she has been belly breathing, on 2 liters daily now, instead of at night, constipation for one solid week. When went to [facility] and did lots of procedures and labs, Ct was done as well. Has noticed pitting edema in feet. Requesting a phone call back, could advancement be on going .Things have declined. Please call or do VV.

## 2021-05-07 NOTE — Telephone Encounter (Signed)
Left message for Kaitlyn Powell to call back with medication change.

## 2021-05-07 NOTE — Telephone Encounter (Signed)
[facility], went straight to VM. Left message that with her symptoms described, I'm not sure this is all just from progression of her dementia. Seizure would not cause the continued symptoms she describes of belly breathing, increased O2 requirements. May need to go back to ER.

## 2021-05-09 MED ORDER — PHENYTOIN 125 MG/5ML PO SUSP: ORAL | 3 refills | Status: AC

## 2021-05-09 NOTE — Telephone Encounter (Signed)
[name] called and informed Just change the 63mL in AM. Continue 30mL in PM

## 2021-05-09 NOTE — Telephone Encounter (Signed)
Patient and her sister are calling to follow up on previous message below.

## 2021-05-09 NOTE — Telephone Encounter (Signed)
I think the infection in itself can also lower her seizure threshold. How many more days on antibiotic? We can increase Dilantin a little, sometimes just a little increase can bump level too much, so we can give Dilantin 108mL in AM, 8 mL in PM. She is currently on 4mL in AM, can increase to 79mL. Thanks

## 2021-05-09 NOTE — Telephone Encounter (Signed)
Lm on vm for [facility] to return call.

## 2021-05-09 NOTE — Telephone Encounter (Signed)
Spoke with Kaitlyn Powell she stated that Kaitlyn Powell had another seizure at 5 am this morning. Kaitlyn Powell's husband is a Engineer, civil (consulting) and asking about increasing Dilantin back up? They said that Kaitlyn Powell is on an antibiotic for UTI and wondering if that could be causing her seizures (in the past antibiotic cause a tonic clonic seizure). Kaitlyn Powell has been in the ER twice in 11 days and they do no think going back in a good choice. Kaitlyn Powell and her husband stated Kaitlyn Powell had a good day yesterday other symptoms form other phone note had stopped and pt had had a BM yesterday. Kaitlyn Powell's husband stated Kaitlyn Powell VS are good he did not give them to me. Kaitlyn Powell stated that Dr Karel Jarvis could call he back if she needed too.

## 2021-05-09 NOTE — Telephone Encounter (Signed)
Just change the 49mL in AM. Continue 104mL in PM. Thanks!

## 2021-05-09 NOTE — Telephone Encounter (Signed)
Spoke with Kaitlyn Powell in regards to recommendations. I have provided her the instructions in how to complete Miralax purge. She states that patient has a PEG, advised that I would space out the intervals of the Miralax purge and give her 1 cup every 30 minutes until all gone if she is tolerating it. Kaitlyn Powell states that this is how they give her the other liquids. Kaitlyn Powell is ware that the purge is only when patient is significantly constipated. Kaitlyn Powell verbalized understanding of all information and had no concerns at the end of the call.

## 2021-05-09 NOTE — Telephone Encounter (Signed)
Kaitlyn Powell stated that Kaitlyn Powell has 2 days left of the antibiotics. Pt is already on Dilantin 37ml in am and 38ml in the pm [facility] stated that they will increase the morning to 42ml's and asking if you want to keep PM at 8 ml or change it?

## 2021-05-14 ENCOUNTER — Ambulatory Visit (INDEPENDENT_AMBULATORY_CARE_PROVIDER_SITE_OTHER): Payer: Medicare Other

## 2021-05-14 DIAGNOSIS — I442 Atrioventricular block, complete: Secondary | ICD-10-CM

## 2021-05-15 ENCOUNTER — Encounter: Payer: Self-pay | Admitting: Nurse Practitioner

## 2021-05-15 ENCOUNTER — Other Ambulatory Visit: Payer: Self-pay

## 2021-05-15 ENCOUNTER — Ambulatory Visit: Payer: Medicare Other | Admitting: Family Medicine

## 2021-05-15 ENCOUNTER — Ambulatory Visit (INDEPENDENT_AMBULATORY_CARE_PROVIDER_SITE_OTHER)
Admission: RE | Admit: 2021-05-15 | Discharge: 2021-05-15 | Disposition: A | Discharge status: Home / Self Care | Payer: Medicare Other | Source: Ambulatory Visit | Attending: Nurse Practitioner | Admitting: Nurse Practitioner

## 2021-05-15 ENCOUNTER — Other Ambulatory Visit (INDEPENDENT_AMBULATORY_CARE_PROVIDER_SITE_OTHER): Payer: Medicare Other

## 2021-05-15 ENCOUNTER — Ambulatory Visit (INDEPENDENT_AMBULATORY_CARE_PROVIDER_SITE_OTHER): Payer: Medicare Other | Admitting: Nurse Practitioner

## 2021-05-15 VITALS — BP 130/76 | HR 73

## 2021-05-15 DIAGNOSIS — R6889 Other general symptoms and signs: Secondary | ICD-10-CM

## 2021-05-15 DIAGNOSIS — R0602 Shortness of breath: Secondary | ICD-10-CM | POA: Diagnosis not present

## 2021-05-15 DIAGNOSIS — K5900 Constipation, unspecified: Secondary | ICD-10-CM

## 2021-05-15 DIAGNOSIS — D649 Anemia, unspecified: Secondary | ICD-10-CM | POA: Diagnosis not present

## 2021-05-15 LAB — IBC + FERRITIN
Ferritin: 138.2 ng/mL (ref 10.0–291.0)
Iron: 97 ug/dL (ref 42–145)
Saturation Ratios: 32.2 % (ref 20.0–50.0)
TIBC: 301 ug/dL (ref 250.0–450.0)
Transferrin: 215 mg/dL (ref 212.0–360.0)

## 2021-05-15 LAB — CBC WITH DIFFERENTIAL/PLATELET
Basophils Absolute: 0.1 10*3/uL (ref 0.0–0.1)
Basophils Relative: 2.5 % (ref 0.0–3.0)
Eosinophils Absolute: 0.1 10*3/uL (ref 0.0–0.7)
Eosinophils Relative: 2.3 % (ref 0.0–5.0)
HCT: 36 % (ref 36.0–46.0)
Hemoglobin: 12 g/dL (ref 12.0–15.0)
Lymphocytes Relative: 18.9 % (ref 12.0–46.0)
Lymphs Abs: 0.7 10*3/uL (ref 0.7–4.0)
MCHC: 33.3 g/dL (ref 30.0–36.0)
MCV: 103.2 fl — ABNORMAL HIGH (ref 78.0–100.0)
Monocytes Absolute: 0.3 10*3/uL (ref 0.1–1.0)
Monocytes Relative: 8.6 % (ref 3.0–12.0)
Neutro Abs: 2.4 10*3/uL (ref 1.4–7.7)
Neutrophils Relative %: 67.7 % (ref 43.0–77.0)
Platelets: 153 10*3/uL (ref 150.0–400.0)
RBC: 3.49 Mil/uL — ABNORMAL LOW (ref 3.87–5.11)
RDW: 15.2 % (ref 11.5–15.5)
WBC: 3.5 10*3/uL — ABNORMAL LOW (ref 4.0–10.5)

## 2021-05-15 LAB — COMPREHENSIVE METABOLIC PANEL
ALT: 6 U/L (ref 0–35)
AST: 25 U/L (ref 0–37)
Albumin: 3.9 g/dL (ref 3.5–5.2)
Alkaline Phosphatase: 112 U/L (ref 39–117)
BUN: 13 mg/dL (ref 6–23)
CO2: 28 mEq/L (ref 19–32)
Calcium: 9.8 mg/dL (ref 8.4–10.5)
Chloride: 90 mEq/L — ABNORMAL LOW (ref 96–112)
Creatinine, Ser: 0.68 mg/dL (ref 0.40–1.20)
GFR: 91.68 mL/min (ref >60.00)
Glucose, Bld: 97 mg/dL (ref 70–99)
Potassium: 4.5 mEq/L (ref 3.5–5.1)
Sodium: 126 mEq/L — ABNORMAL LOW (ref 135–145)
Total Bilirubin: 0.4 mg/dL (ref 0.2–1.2)
Total Protein: 9 g/dL — ABNORMAL HIGH (ref 6.0–8.3)

## 2021-05-15 LAB — B12 AND FOLATE PANEL
Folate: >23.4 ng/mL (ref >5.9)
Vitamin B-12: 756 pg/mL (ref 211–911)

## 2021-05-15 NOTE — Patient Instructions (Signed)
LABS:  Lab work has been ordered for you today. Our lab is located in the basement. Press "B" on the elevator. The lab is located at the first door on the left as you exit the elevator.  HEALTHCARE LAWS AND MY CHART RESULTS: Due to recent changes in healthcare laws, you may see the results of your imaging and laboratory studies on MyChart before your provider has had a chance to review them.   We understand that in some cases there may be results that are confusing or concerning to you. Not all laboratory results come back in the same time frame and the provider may be waiting for multiple results in order to interpret others.  Please give [place] 48 hours in order for your provider to thoroughly review all the results before contacting the office for clarification of your results.   IMAGING Your provider has requested that you have an chest x-ray and abdominal x ray before leaving today. Please go to the basement floor to our Radiology department for the test.   RECOMMENDATIONS: Miralax- Dissolve one capful in 8 ounces of water and drink before bed as needed. Follow up with primary care provider or neurologist regarding recent seizures.  Go to the emergency room if shortness of breath persists or worsens.  It was great seeing you today! Thank you for entrusting me with your care and choosing [facility].  Arnaldo Natal, CRNP

## 2021-05-15 NOTE — Progress Notes (Signed)
05/15/2021 Kaitlyn Powell [id number] 06-Jul-1956   Chief Complaint: Abdominal pain   History of Present Illness: Kaitlyn Powell is a 65 year old female with a past medical history of Down syndrome, Parkinson's disease, hyponatremia, PE on Xarelto, OSA on BiPAP and Alzheimer's dementia.  She is nonverbal.  She is accompanied by her sister and brother-in-law who provide her history.  Her sister and brother-in-law are concerned Jensyn is having abdominal pain which is noticeable when she stands from a sitting position as this movement causes her to make grunting noises with increased respirations for the past month. She also has increased burping and hiccups when she stands up.  No distress assessed when eating.  She eats a soft and pured diet without coughing or obvious difficulty with swallowing.  She receives water and other fluids via PEG tube.  Her family witnessed a grand mal type seizure 3 weeks ago and a second similar seizure on 04/28/2021.  She went 7 days without passing a bowel movement after her most recent seizure.  She typically passes a normal formed brown bowel movement daily.  She received MiraLAX, senna liquid, smooth move tea, Duca locks suppository and an enema without significant relief.  Several days later she finally started passing large amounts of brown formed stool.  Since then, she is passing a formed brown bowel movement every day or every other day.  No rectal bleeding or black stools.  No vomiting.  Her sister and brother-in-law who is a Engineer, civil (consulting) assisted with standing Shelma up from the wheelchair and during this change of position she developed tight respirations with audible wheezes. She was successfully transferred to the exam table, rested for several minutes before returning back to the wheel chair. In no acute distress. I did not observe any obvious signs of abdominal distress, no signs of guarding. I explained to her family that I am more concerned about her respiratory  status and possible underlying cardiac issue as the etiology for her near respiratory distress symptoms.  This is a challenging situation as the patient is nonverbal and is unable to communicate if she is having abdominal pain.  Her brother-in-law disagreed with my assessment and he stated [name] was anxious which results in breathing faster.  She was apparently seen by her pulmonologist within the past and without any significant changes on exam.  In review of her epic records, she presented to [facility] ED on 05/02/2021 due to having shortness of breath.  Her oxygen saturations were around 85% per EMS.  A chest x-ray was without acute changes.  A chest CTA was negative, no evidence of PE.  CTAP with contrast without acute intra-abdominal/pelvic abnormality.  Sodium 129.  BUN 13.  Creatinine 0.60.  Albumin 2.9.  Normal LFTs.  Troponin 15.  BNP 165.5.  WBC 4.9.  Hemoglobin 9.3.  MCV 101.1.  Platelet 95.  Overall, her clinical status was stable and she did not meet criteria for admission and she was discharged home.  CTAP 05/02/2021 with contrast: Hepatobiliary: No focal liver abnormality is seen. No gallstones, gallbladder wall thickening, or biliary dilatation.   Pancreas: Unremarkable. No pancreatic ductal dilatation or surrounding inflammatory changes.   Spleen: Normal in size without focal abnormality.   Adrenals/Urinary Tract: Adrenal glands are within normal limits. Kidneys are well visualized bilaterally. No renal calculi are seen. Normal excretion of contrast is noted. Bladder is well distended.   Stomach/Bowel: The appendix is not well visualized. A gastrostomy catheter is noted in satisfactory  position. No obstructive or inflammatory changes of the colon are seen. Small bowel and stomach are otherwise within normal limits.   Vascular/Lymphatic: Aortic atherosclerosis. No enlarged abdominal or pelvic lymph nodes.   Reproductive: Uterus and bilateral adnexa are unremarkable.    Other: No abdominal wall hernia or abnormality. No abdominopelvic ascites.   Musculoskeletal: Multiple compression deformities are noted involving L1 and L2. Degenerative changes are seen. Impression: No acute abnormality is noted. Chronic appearing compression deformities are noted   Chest CTA 05/02/2021: CTA of the chest: No evidence of pulmonary emboli.  Stable appearing aberrant right subclavian artery.  Multiple compression fractures stable in appearance.  Mild basilar scarring.  Current Outpatient Medications on File Prior to Visit  Medication Sig Dispense Refill   albuterol (PROVENTIL) (2.5 MG/3ML) 0.083% nebulizer solution Take 2.5 mg by nebulization every 6 (six) hours as needed for wheezing or shortness of breath.     budesonide (PULMICORT) 0.5 MG/2ML nebulizer solution INHALE 1 VIAL BY NEBULIZATION IN THE MORNING AND AT BEDTIME. 120 mL 11   cannabidiol (EPIDIOLEX) 100 MG/ML solution GIVE 3.7 ML BY MOUTH 2 TIMES A DAY. 222 mL 5   carbidopa-levodopa (SINEMET IR) 25-100 MG tablet PLACE 2 TABLETS INTO FEEDING TUBE 3 (THREE) TIMES DAILY. TAKE 2 TABLET THREE TIMES A DAY WITH MEALS 216 tablet 0   cyclobenzaprine (FLEXERIL) 5 MG tablet Take 1/2 to 1 tablet at night as needed for muscle spasms 30 tablet 4   diclofenac Sodium (VOLTAREN) 1 % GEL Apply 2 g topically 4 (four) times daily. To affected joint. (Patient taking differently: Apply 2 g topically 4 (four) times daily as needed. To affected joint.) 350 g 6   folic acid (FOLVITE) 1 MG tablet Take 1 mg by mouth daily.     furosemide (LASIX) 20 MG tablet TAKE 1 TABLET BY MOUTH DAILY FOR 3 DAYS. (Patient taking differently: daily as needed.) 30 tablet 0   Incontinence Supplies (EXTENSION TUBING/CONNECTOR) MISC Use with PEG feedings change every 5 days Mic-key extension 6 each 11   levETIRAcetam (KEPPRA) 100 MG/ML solution GIVE (500MG ) IN AM, (1000MG ) IN PM 473 mL 0   levothyroxine (SYNTHROID) 150 MCG tablet Place 150 mcg into  feeding tube daily before breakfast.     NONFORMULARY OR COMPOUNDED ITEM Antifungal solution: Terbinafine 3%, Fluconazole 2%, Tea Tree Oil 5%, Urea 10%, Ibuprofen 2% in DMSO suspension #58mL (Patient taking differently: as needed. Antifungal solution: Terbinafine 3%, Fluconazole 2%, Tea Tree Oil 5%, Urea 10%, Ibuprofen 2% in DMSO suspension #67mL) 1 each 3   Nutritional Supplements (FEEDING SUPPLEMENT, OSMOLITE 1.2 CAL,) LIQD 1 can of Osmolite 1.2 bolus feeding daily Disp 30 day supply refill 1 year 237 mL 11   OXYGEN Inhale 3 L into the lungs as directed. With CPAP     phenytoin (DILANTIN) 125 MG/5ML suspension Take 3mL  in AM, 75mL (200mg ) in PM 1080 mL 3   rivaroxaban (XARELTO) 20 MG TABS tablet Take 1 tablet (20 mg total) by mouth daily with supper. 30 tablet 5   scopolamine (TRANSDERM SCOP, 1.5 MG,) 1 MG/3DAYS Place 1 patch (1.5 mg total) onto the skin every 3 (three) days. 24 patch 2   Sennosides (SENNA) 8.8 MG/5ML SYRP TAKE 10 MLS BY MOUTH DAILY AS NEEDED 237 mL 1   simvastatin (ZOCOR) 40 MG tablet TAKE 1 TABLET (40 MG TOTAL) BY MOUTH AT BEDTIME. PLEASE KEEP UPCOMING APPT FOR FUTURE REFILLS. 90 tablet 3   sodium chloride (MURO 128) 5 % ophthalmic ointment  Place 1 application into both eyes at bedtime.     SYRINGE DISPOSABLE 60CC 60 ML MISC 1 syringe per feeding three times a day 90 each 12   vitamin B-12 (CYANOCOBALAMIN) 100 MCG tablet Place 100 mcg into feeding tube daily.      [DISCONTINUED] Omeprazole Magnesium (PRILOSEC) 10 MG PACK 20 mg by PEG Tube route daily before breakfast. 60 each 3   No current facility-administered medications on file prior to visit.   . Allergies  Allergen Reactions   Cephalexin Other (See Comments)    Seizures    Namenda [Memantine Hcl] Other (See Comments)    Made memory issues worse, sleepiness, increased confusion   Current Medications, Allergies, Past Medical History, Past Surgical History, Family History and Social History were reviewed in [employer] record.  Review of Systems:   Constitutional: Negative for fever, sweats, chills or weight loss.  Respiratory: Negative for shortness of breath.   Cardiovascular: Negative for chest pain, palpitations and leg swelling.  Gastrointestinal: See HPI.  Musculoskeletal: Negative for back pain or muscle aches.  Neurological: Negative for dizziness, headaches or paresthesias.    Physical Exam: BP 130/76   Pulse 73   SpO2 95%  Wt Readings from Last 3 Encounters:  05/02/21 160 lb 15 oz (73 kg)  02/18/21 160 lb (72.6 kg)  12/13/20 165 lb (74.8 kg)    General: Pale complected 65 year old female with Down syndrome. Head: Down syndrome facial features. Eyes: No scleral icterus. Conjunctiva pink . Ears: Normal auditory acuity. Lungs: Tight breath sounds with audible wheezes.  Diminished in the bases. Heart: Regular rate and rhythm, no murmur. Abdomen: Soft, nondistended.  Mild central abdominal firmness.  No overt signs of tenderness/guarding or rebound.  No masses or hepatomegaly. Normal bowel sounds x 4 quadrants.  PEG tube site intact. Rectal: Deferred.  Musculoskeletal: Stiff gait and movements of all extremities. Extremities: No edema. Neurological: She is nonverbal.  She moves all extremities. Psychological: Alert and cooperative. Normal mood and affect  Assessment and Recommendations:  1) 65 year old female with Down syndrome with constipation possibly triggered following a witnessed grand mal seizure.  Family reported no BM for 7 days, eventually passed a large amount of stool after taking MiraLAX, senna, smooth move tea, Dulcolax suppository and an enema.  Since then, she is passing a formed brown bowel movement every day or every other day.  No rectal bleeding or black stool.  Family is concerned she is having abdominal pain as she grunts and breathes faster when she stands up from a sitting position x 4 weeks.  CTAP with contrast 05/02/2021 done in the ED without  evidence of a bowel obstruction or intra-abdominal/pelvic inflammatory or infectious process. -KUB to rule out stool-filled colon/obstruction -Recommended MiraLAX nightly -Okay to continue smooth move tea daily  2) Shortness of breath which is worse with exertion. History of PE in 2017 on Xarelto. Chest CTA 05/02/2021 without evidence of PE.  Echo 01/2019 with LVEF 60 to 65% with mild MR.  -Chest x-ray -Follow-up with pulmonologist -May require further cardiac evaluation -Patient present to the ED if she has worsening shortness of breath  3) Macrocytic anemia.  No overt GI bleeding. -CBC, iron, ferritin, folate and B12 level  4) Thrombocytopenia, unknown etiology.  Normal liver per CT 05/02/2021.

## 2021-05-16 ENCOUNTER — Telehealth: Payer: Self-pay | Admitting: Pulmonary Disease

## 2021-05-16 ENCOUNTER — Telehealth (INDEPENDENT_AMBULATORY_CARE_PROVIDER_SITE_OTHER): Payer: Medicare Other | Admitting: Pulmonary Disease

## 2021-05-16 ENCOUNTER — Encounter: Payer: Self-pay | Admitting: Pulmonary Disease

## 2021-05-16 DIAGNOSIS — R0602 Shortness of breath: Secondary | ICD-10-CM | POA: Diagnosis not present

## 2021-05-16 LAB — CUP PACEART REMOTE DEVICE CHECK
Battery Remaining Longevity: 120 mo
Battery Remaining Percentage: 100 %
Brady Statistic RA Percent Paced: 18 %
Brady Statistic RV Percent Paced: 13 %
Date Time Interrogation Session: 20221012022100
Implantable Lead Implant Date: 20170508
Implantable Lead Implant Date: 20170508
Implantable Lead Location: 753859
Implantable Lead Location: 753860
Implantable Lead Model: 7740
Implantable Lead Model: 7741
Implantable Lead Serial Number: 1111
Implantable Lead Serial Number: 1111
Implantable Pulse Generator Implant Date: 20170508
Lead Channel Impedance Value: 649 Ohm
Lead Channel Impedance Value: 705 Ohm
Lead Channel Pacing Threshold Amplitude: 0.7 V
Lead Channel Pacing Threshold Pulse Width: 0.4 ms
Lead Channel Setting Pacing Amplitude: 2 V
Lead Channel Setting Pacing Amplitude: 5 V
Lead Channel Setting Pacing Pulse Width: 1 ms
Lead Channel Setting Sensing Sensitivity: 2.5 mV
Pulse Gen Serial Number: 747619

## 2021-05-16 NOTE — Telephone Encounter (Signed)
LMTCB with patient/sister.      JE please see CXR from 05/15/21. Please advise. Thanks :)

## 2021-05-16 NOTE — Progress Notes (Signed)
Virtual Visit via Video Note  I connected with Kaitlyn Powell on 05/16/21 at  3:15 PM EDT by a video enabled telemedicine application and verified that I am speaking with the correct person using two identifiers.  Location: Patient: Home Provider: [facility] office   I discussed the limitations of evaluation and management by telemedicine and the availability of in person appointments. The patient expressed understanding and agreed to proceed.   Subjective:   PATIENT ID: Kaitlyn Powell GENDER: female DOB: January 25, 1956, MRN: [id number]   HPI  Chief Complaint  Patient presents with   Follow-up    Pt has been to the hospital multiple times since last visit. Pt has had problems with SOB and also with having few BM's and having discomfort from that.    Reason for Visit: Follow-up  [name] is a 65 year old female with Down syndrome, Parkinson's disease, epilepsy and OSA on BiPAP who presents for follow-up. Patient is non-verbal. Her sister and brother-in-law provide history noted below.  01/02/21 Since our last visit she has become less ambulatory and currently unable to bear weight or walk. Family has requested Palliative consult and has an appointment scheduled for 6/15. Our clinic was contacted regarding worsening shortness of breath and hypoxemia on room air. She is compliant with her nocturnal oxygen of 3L via [place] at home however the hypoxemia during the daytime is new and she has developed crackles per her caregivers. She on Pulmicort and Duonebs. Secretions has been minimal. She is less alert and breathing hard. Her oxygen during the day has been < 88% and will improve on 2L to 95%.  04/14/21 Since our last visit, she was treated with extended course of antibiotics for acute hypoxemic respiratory failure suspected to be related to aspiration. Today she has productive cough with crackles. Secretions clear. Some days she has more drooling especially when she is less alert.  Uses guafenisin up to four times daily with symptoms still. Compliant with Pulmicort and Duonebs. She has since been weaned off oxygen.  She had CXR 9/6 which was negative negative.  05/16/21 Since our last visit, she has had multiple ED visits including for hyponatremia and most recently abdominal distension/pain associated with shortness of breath on 05/02/21. CTA PE and CT A/P were unremarkable. She was discharged home from the ED. However she continues to grunt and seem short of breath/holding breath however no desaturation. Nadir 88% but 92% including now. She has been seen by Cardiology and diuresed with improvement in lower extremity edema. She has been compliant with BiPAP. Secretions has resolved since being on scheduled guaifenesin. Never had to use scopolamine patch.  Past Medical History:  Diagnosis Date   Arthritis    Aspiration pneumonia (HCC)    Asthma    Cardiac arrhythmia    have pacemeaker   Down syndrome    DVT of popliteal vein (HCC) 09/08/2018   chronic   GERD (gastroesophageal reflux disease)    Heart block    Hypothyroidism    Mental retardation    Pacemaker    Parkinson's disease (HCC) 05/2019   Pulmonary emboli (HCC)    Seizures (HCC)    Sleep apnea with use of continuous positive airway pressure (CPAP)      Allergies  Allergen Reactions   Cephalexin Other (See Comments)    Seizures    Namenda [Memantine Hcl] Other (See Comments)    Made memory issues worse, sleepiness, increased confusion     Outpatient Medications Prior to Visit  Medication Sig Dispense Refill   albuterol (PROVENTIL) (2.5 MG/3ML) 0.083% nebulizer solution Take 2.5 mg by nebulization every 6 (six) hours as needed for wheezing or shortness of breath.     budesonide (PULMICORT) 0.5 MG/2ML nebulizer solution INHALE 1 VIAL BY NEBULIZATION IN THE MORNING AND AT BEDTIME. 120 mL 11   cannabidiol (EPIDIOLEX) 100 MG/ML solution GIVE 3.7 ML BY MOUTH 2 TIMES A DAY. 222 mL 5   carbidopa-levodopa  (SINEMET IR) 25-100 MG tablet PLACE 2 TABLETS INTO FEEDING TUBE 3 (THREE) TIMES DAILY. TAKE 2 TABLET THREE TIMES A DAY WITH MEALS 216 tablet 0   cyclobenzaprine (FLEXERIL) 5 MG tablet Take 1/2 to 1 tablet at night as needed for muscle spasms 30 tablet 4   diclofenac Sodium (VOLTAREN) 1 % GEL Apply 2 g topically 4 (four) times daily. To affected joint. (Patient taking differently: Apply 2 g topically 4 (four) times daily as needed. To affected joint.) 350 g 6   folic acid (FOLVITE) 1 MG tablet Take 1 mg by mouth daily.     furosemide (LASIX) 20 MG tablet TAKE 1 TABLET BY MOUTH DAILY FOR 3 DAYS. (Patient taking differently: daily as needed.) 30 tablet 0   Incontinence Supplies (EXTENSION TUBING/CONNECTOR) MISC Use with PEG feedings change every 5 days Mic-key extension 6 each 11   levETIRAcetam (KEPPRA) 100 MG/ML solution GIVE (500MG ) IN AM, (1000MG ) IN PM 473 mL 0   levothyroxine (SYNTHROID) 150 MCG tablet Place 150 mcg into feeding tube daily before breakfast.     Nutritional Supplements (FEEDING SUPPLEMENT, OSMOLITE 1.2 CAL,) LIQD 1 can of Osmolite 1.2 bolus feeding daily Disp 30 day supply refill 1 year 237 mL 11   OXYGEN Inhale 3 L into the lungs as directed. With CPAP     phenytoin (DILANTIN) 125 MG/5ML suspension Take 41mL  in AM, 39mL (200mg ) in PM 1080 mL 3   rivaroxaban (XARELTO) 20 MG TABS tablet Take 1 tablet (20 mg total) by mouth daily with supper. 30 tablet 5   scopolamine (TRANSDERM SCOP, 1.5 MG,) 1 MG/3DAYS Place 1 patch (1.5 mg total) onto the skin every 3 (three) days. 24 patch 2   Sennosides (SENNA) 8.8 MG/5ML SYRP TAKE 10 MLS BY MOUTH DAILY AS NEEDED 237 mL 1   simvastatin (ZOCOR) 40 MG tablet TAKE 1 TABLET (40 MG TOTAL) BY MOUTH AT BEDTIME. PLEASE KEEP UPCOMING APPT FOR FUTURE REFILLS. 90 tablet 3   sodium chloride (MURO 128) 5 % ophthalmic ointment Place 1 application into both eyes at bedtime.     SYRINGE DISPOSABLE 60CC 60 ML MISC 1 syringe per feeding three times a day  90 each 12   vitamin B-12 (CYANOCOBALAMIN) 100 MCG tablet Place 100 mcg into feeding tube daily.      NONFORMULARY OR COMPOUNDED ITEM Antifungal solution: Terbinafine 3%, Fluconazole 2%, Tea Tree Oil 5%, Urea 10%, Ibuprofen 2% in DMSO suspension #42mL (Patient not taking: Reported on 05/16/2021) 1 each 3   No facility-administered medications prior to visit.    Review of Systems  Constitutional:  Negative for chills, diaphoresis, fever, malaise/fatigue and weight loss.  HENT:  Negative for congestion.   Respiratory:  Negative for cough, hemoptysis, sputum production, shortness of breath and wheezing.   Cardiovascular:  Negative for chest pain, palpitations and leg swelling.    Objective:   There were no vitals filed for this visit. SpO2 92%    Physical Exam: General: Chronically ill--appearing, Down-syndrome features, no acute distress HENT: [place], AT Eyes: EOMI,  no scleral icterus Respiratory:: No respiratory distress. No audible wheezing Neuro: Awake, interactive, engaged, non-verbal  Data Reviewed:  Imaging: CT Chest 09/21/19 - Bibasilar consolidation with air bronchograms CXR 04/08/21 - No infiltrate effusion or edema CXR 05/16/21 - Low lung volumes. Chronic bilateral opacities  PFT: None on file  Sleep Studies: PSG 09/10/15 - AHI 12.8. 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 Reviewed chest imaging including recent CTA and CXR with unchanged bibasilar scarring with no new infiltrate. Patient is non-toxic appearing with no clinical signs/symptoms concerning for active infection (no fever, cough, worsening secretions, AMS). Would not recommend treatment for pneumonia at this time. Although patient's communication is limited, it seems that her work-up for her symptoms is reassuring with no  evidence of acute issues to treat. Family expressed understanding and appreciated discussion.  History of aspiration CXR reviewed and appears chronic. No stigmata of infection.  --No antibiotics indicated --CONTINUE supplemental oxygen for goal SpO2 >88% as needed  Aspiration/secretions - improving --CONTINUE guaifenesin q4h as needed --DC scopolamine patch  Asthma  --CONTINUE Pulmicort nebulizer TWICE a day --CONTINUE Brovana nebulizer TWICE a day --CONTINUE Duonebs nebulizer TWICE a day  --CONTINUE Albuterol four times a day as needed  OSA on BiPAP --CONTINUE BiPAP nightly and when napping  Seizures --Followed by Neurology. Recently increased Dilantin  Goals of Care Followed by Palliative Care  Health Maintenance Immunization History  Administered Date(s) Administered   Influenza Inj Mdck Quad Pf 04/25/2019   Influenza Split 05/03/2016, 04/16/2017   Influenza, Quadrivalent, Recombinant, Inj, Pf 04/25/2019   Influenza, Seasonal, Injecte, Preservative Fre 05/03/2016, 04/16/2017, 05/31/2017   Influenza,inj,Quad PF,6+ Mos 04/22/2017, 05/02/2018, 05/15/2020, 04/21/2021   Influenza-Unspecified 05/03/2016, 05/03/2019   Janssen (J&J) SARS-COV-2 Vaccination 11/04/2019   PFIZER(Purple Top)SARS-COV-2 Vaccination 09/02/2020   Pfizer Covid-19 Vaccine Bivalent Booster 40yrs & up 04/21/2021   Pneumococcal Polysaccharide-23 07/19/2017   Pneumococcal-Unspecified 07/19/2017   Tdap 11/01/2014   CT Lung Screen - not qualified  No orders of the defined types were placed in this encounter.  No orders of the defined types were placed in this encounter.  I have spent a total time of 25-minutes on the day of the appointment reviewing prior documentation, coordinating care and discussing medical diagnosis and plan with the patient/family. Past medical history, allergies, medications were reviewed. Pertinent imaging, labs and tests included in this note have been reviewed and interpreted  independently by me.  No follow-ups on file.  Maxx Calaway Mechele Collin, MD [facility] 05/16/2021 3:50 PM  Office Number [phone]

## 2021-05-16 NOTE — Telephone Encounter (Signed)
Spoke with [name] (per [facility]) and scheduled pt for My Chart visit with Dr. Everardo All at 3:15pm . Nothing further needed at this time.   Routing to Dr. Everardo All as Lorain Childes

## 2021-05-16 NOTE — Telephone Encounter (Signed)
Please schedule video visit with me at 3:15PM

## 2021-05-20 ENCOUNTER — Telehealth (HOSPITAL_COMMUNITY): Payer: Self-pay

## 2021-05-20 ENCOUNTER — Other Ambulatory Visit (HOSPITAL_COMMUNITY): Payer: Self-pay | Admitting: Interventional Radiology

## 2021-05-20 DIAGNOSIS — Z431 Encounter for attention to gastrostomy: Secondary | ICD-10-CM

## 2021-05-20 NOTE — Telephone Encounter (Signed)
Will call to schedule peg replacement when I hear back from schedulers. AW

## 2021-05-23 ENCOUNTER — Other Ambulatory Visit: Payer: Self-pay | Admitting: Pulmonary Disease

## 2021-05-23 ENCOUNTER — Other Ambulatory Visit: Payer: Self-pay | Admitting: Neurology

## 2021-05-23 NOTE — Progress Notes (Signed)
Remote pacemaker transmission.   

## 2021-05-27 ENCOUNTER — Other Ambulatory Visit: Payer: Self-pay | Admitting: Pulmonary Disease

## 2021-05-28 ENCOUNTER — Other Ambulatory Visit: Payer: Self-pay | Admitting: Pulmonary Disease

## 2021-05-28 NOTE — Telephone Encounter (Signed)
Dr. Everardo All, please advise on this.  Changes Requested   ipratropium (ATROVENT) 0.02 % nebulizer solution       Changed from: ipratropium-albuterol (DUONEB) 0.5-2.5 (3) MG/3ML SOLN   Sig: N/A   Disp:  Not specified    Refills:  0   Start: 05/27/2021   Class: Normal   Non-formulary   Last ordered: Yesterday by Luciano Cutter, MD Last refill: 05/27/2021   Rx #: [id number]   Pharmacy comment: Alternative Requested:THE PRESCRIBED MEDICATION IS NOT COVERED BY INSURANCE. PLEASE CONSIDER CHANGING TO ONE OF THE SUGGESTED COVERED ALTERNATIVES.     To be filled at: [id number] - [place], [place] - [address]     If needed, we can route this to Rx Prior Auth Pool to try to help figure out which neb sol is covered.

## 2021-05-28 NOTE — Telephone Encounter (Signed)
Atrovent is not an acceptable alternative. She needs Duonebs q6h. Refilled on 05/27/21. Can you look into whether Part B medicare can cover this?

## 2021-05-29 ENCOUNTER — Telehealth: Payer: Self-pay | Admitting: Neurology

## 2021-05-29 ENCOUNTER — Telehealth: Payer: Self-pay | Admitting: Pulmonary Disease

## 2021-05-29 MED ORDER — LEVOFLOXACIN 25 MG/ML PO SOLN: 500.0000 mg | Freq: Every day | ORAL | 0 refills | Status: AC

## 2021-05-29 NOTE — Telephone Encounter (Signed)
Pt's sister called in stating there have been some changes. She is twitching, jerking, getting anxious, she seems to not want to open her eyes, and belly breathing. They are not sure what to do between now and the next visit on 06/17/21.

## 2021-05-29 NOTE — Telephone Encounter (Signed)
Spoke with the pt's family member, [name]  He states he was seen by Dr Everardo All yesterday and discussed Boyd with her  He says that she has been having increased DOE and increased cough with clear sputum x 2 days  He is a nurse and when he listened to her lungs he hears crackles in her RML  He said her sats with ambulation have been as low as 85% ra and this increased to 95% with 2lpm o2  He states that she has not had any fevers or aches  She is using all of her respiratory meds as directed  She has not taken a covid test  He wants recommendations to try and keep her out of the hospital  She had a cxr with GI that is in Epic from 05/15/21 Please advise thanks

## 2021-05-29 NOTE — Telephone Encounter (Signed)
I contacted patient sister, she refused to take this young lady to ER, states,"her husband is a cardiac RN". Wanted you to be advised of some anxiety she has been having over the last few weeks, the last few days. She has a noticed a decline in her health. With her breathing, etc. She did call her pulmonologist to let them know as well.Again she refused ER for evaluation due to covid, Rsv, flu. So rotating this to you to let you know, I advised her you were not her and I would forward this to you. Thank you.

## 2021-05-29 NOTE — Telephone Encounter (Signed)
[place] Telephone Encounter  She had some intermittent episodes twitching and jerking that is not typical of her usual seizures associated with hyperventilation. Family concerned regarding pain vs parkinsons vs new pulmonary infection (increased secretions, new O2 requirement). They have also contacted her neurologist today.  We discussed whether patient should be evaluated in the ED. Family understandably wishes to avoid if possible. The cause of her symptoms is difficult to elicit with her multiple co-morbidities and she would benefit from ED evaluation. I have encourage them to seek medical attention if she worsens, including seeing her cognitive symptoms recur/worsen and oxygen requirements increase. For now family requesting antibiotics as her mental status seems to be at baseline even during the twitching episodes.   Assessment/Plan Concern for recurrent aspiration --ORDERED levaquin x 7 days --CONTINUE oxygen for goal SpO2 >88% --CONTINUE guaifenesin q4h as needed  Generalized twitching --Unable to observe on telephone encounter --Advised to present to ED for worsening mental status or recurrent symptoms

## 2021-05-30 NOTE — Telephone Encounter (Signed)
Spoke to [place]. They are thinking she has a couple of issues going on. She is on antibiotics. For the past couple of days, she has had trembling, twitching, they are worried about a neurological cause. They mostly occur when they are moving her, she is on the BiPAP and quiet and comfortable currently. It was almost non-stop the past few days, then 12:30am,she reached her hand out trembling and started to pull herself up but she was asleep. They held her and whole body was trembling, she settled down in . Then had another at 1:30am. She would not open her eyes, "looked like not even with [place]." No fevers, she would be burning up, normal temp, then has a BM. They are increasing her O2 requirements to keep sats up. Discussed that these symptoms are not clearly seizure-related, sound more systemic, likely related to underlying infection being treated. They are certainly different from her typical seizures. We had increased her Dilantin dose when she had seizures over a month ago, I am hesitant to increase seizure medication for these symptoms without evaluating her first. She may need a head CT and Dilantin level. Discussed that we should think about goals of care, they just want her assessed, do not want her intubated but if she needs IV medications they are okay with that. They would like to keep her closely monitored at home but if she starts having more O2 desaturation, will bring her in.

## 2021-05-30 NOTE — Telephone Encounter (Signed)
Patient's sister, [name], called requesting a call back about this matter.  She said the patient is still about the same today and she'd like Dr. Rosalyn Gess recommendations.   She said she was expecting a call back this morning and is following up.

## 2021-06-02 ENCOUNTER — Emergency Department (HOSPITAL_COMMUNITY): Payer: Medicare Other

## 2021-06-02 ENCOUNTER — Inpatient Hospital Stay (HOSPITAL_COMMUNITY): Payer: Medicare Other

## 2021-06-02 ENCOUNTER — Other Ambulatory Visit: Payer: Self-pay

## 2021-06-02 ENCOUNTER — Inpatient Hospital Stay (HOSPITAL_COMMUNITY)
Admission: EM | Admit: 2021-06-02 | Discharge: 2021-07-03 | DRG: 871 | Disposition: E | Payer: Medicare Other | Attending: Internal Medicine | Admitting: Internal Medicine

## 2021-06-02 DIAGNOSIS — Z7951 Long term (current) use of inhaled steroids: Secondary | ICD-10-CM

## 2021-06-02 DIAGNOSIS — D61818 Other pancytopenia: Secondary | ICD-10-CM | POA: Diagnosis present

## 2021-06-02 DIAGNOSIS — R1310 Dysphagia, unspecified: Secondary | ICD-10-CM | POA: Diagnosis present

## 2021-06-02 DIAGNOSIS — E669 Obesity, unspecified: Secondary | ICD-10-CM | POA: Diagnosis present

## 2021-06-02 DIAGNOSIS — J189 Pneumonia, unspecified organism: Secondary | ICD-10-CM | POA: Diagnosis present

## 2021-06-02 DIAGNOSIS — J690 Pneumonitis due to inhalation of food and vomit: Secondary | ICD-10-CM | POA: Diagnosis present

## 2021-06-02 DIAGNOSIS — G4733 Obstructive sleep apnea (adult) (pediatric): Secondary | ICD-10-CM | POA: Diagnosis present

## 2021-06-02 DIAGNOSIS — Z9981 Dependence on supplemental oxygen: Secondary | ICD-10-CM

## 2021-06-02 DIAGNOSIS — F0280 Dementia in other diseases classified elsewhere without behavioral disturbance: Secondary | ICD-10-CM | POA: Diagnosis present

## 2021-06-02 DIAGNOSIS — Z20822 Contact with and (suspected) exposure to covid-19: Secondary | ICD-10-CM | POA: Diagnosis present

## 2021-06-02 DIAGNOSIS — G20 Parkinson's disease: Secondary | ICD-10-CM | POA: Diagnosis present

## 2021-06-02 DIAGNOSIS — G40909 Epilepsy, unspecified, not intractable, without status epilepticus: Secondary | ICD-10-CM | POA: Diagnosis present

## 2021-06-02 DIAGNOSIS — E039 Hypothyroidism, unspecified: Secondary | ICD-10-CM | POA: Diagnosis present

## 2021-06-02 DIAGNOSIS — Z931 Gastrostomy status: Secondary | ICD-10-CM

## 2021-06-02 DIAGNOSIS — Z515 Encounter for palliative care: Secondary | ICD-10-CM | POA: Diagnosis not present

## 2021-06-02 DIAGNOSIS — Z66 Do not resuscitate: Secondary | ICD-10-CM | POA: Diagnosis present

## 2021-06-02 DIAGNOSIS — Q909 Down syndrome, unspecified: Secondary | ICD-10-CM

## 2021-06-02 DIAGNOSIS — Z6832 Body mass index (BMI) 32.0-32.9, adult: Secondary | ICD-10-CM

## 2021-06-02 DIAGNOSIS — Z86718 Personal history of other venous thrombosis and embolism: Secondary | ICD-10-CM | POA: Diagnosis not present

## 2021-06-02 DIAGNOSIS — Z7901 Long term (current) use of anticoagulants: Secondary | ICD-10-CM

## 2021-06-02 DIAGNOSIS — A419 Sepsis, unspecified organism: Secondary | ICD-10-CM | POA: Diagnosis present

## 2021-06-02 DIAGNOSIS — Z833 Family history of diabetes mellitus: Secondary | ICD-10-CM

## 2021-06-02 DIAGNOSIS — Z8701 Personal history of pneumonia (recurrent): Secondary | ICD-10-CM

## 2021-06-02 DIAGNOSIS — D696 Thrombocytopenia, unspecified: Secondary | ICD-10-CM | POA: Diagnosis present

## 2021-06-02 DIAGNOSIS — J9601 Acute respiratory failure with hypoxia: Secondary | ICD-10-CM | POA: Diagnosis not present

## 2021-06-02 DIAGNOSIS — Z79899 Other long term (current) drug therapy: Secondary | ICD-10-CM

## 2021-06-02 DIAGNOSIS — E861 Hypovolemia: Secondary | ICD-10-CM | POA: Diagnosis present

## 2021-06-02 DIAGNOSIS — E871 Hypo-osmolality and hyponatremia: Secondary | ICD-10-CM | POA: Diagnosis present

## 2021-06-02 DIAGNOSIS — R569 Unspecified convulsions: Secondary | ICD-10-CM

## 2021-06-02 DIAGNOSIS — R0603 Acute respiratory distress: Secondary | ICD-10-CM

## 2021-06-02 DIAGNOSIS — Z7189 Other specified counseling: Secondary | ICD-10-CM | POA: Diagnosis not present

## 2021-06-02 DIAGNOSIS — J9621 Acute and chronic respiratory failure with hypoxia: Secondary | ICD-10-CM | POA: Diagnosis present

## 2021-06-02 DIAGNOSIS — Z86711 Personal history of pulmonary embolism: Secondary | ICD-10-CM | POA: Diagnosis not present

## 2021-06-02 DIAGNOSIS — J4550 Severe persistent asthma, uncomplicated: Secondary | ICD-10-CM | POA: Diagnosis present

## 2021-06-02 DIAGNOSIS — Z8249 Family history of ischemic heart disease and other diseases of the circulatory system: Secondary | ICD-10-CM

## 2021-06-02 DIAGNOSIS — K219 Gastro-esophageal reflux disease without esophagitis: Secondary | ICD-10-CM | POA: Diagnosis present

## 2021-06-02 DIAGNOSIS — Z950 Presence of cardiac pacemaker: Secondary | ICD-10-CM

## 2021-06-02 DIAGNOSIS — D6489 Other specified anemias: Secondary | ICD-10-CM | POA: Diagnosis present

## 2021-06-02 DIAGNOSIS — G40211 Localization-related (focal) (partial) symptomatic epilepsy and epileptic syndromes with complex partial seizures, intractable, with status epilepticus: Secondary | ICD-10-CM | POA: Diagnosis present

## 2021-06-02 DIAGNOSIS — Z7401 Bed confinement status: Secondary | ICD-10-CM

## 2021-06-02 DIAGNOSIS — G928 Other toxic encephalopathy: Secondary | ICD-10-CM | POA: Diagnosis present

## 2021-06-02 DIAGNOSIS — Z79890 Hormone replacement therapy (postmenopausal): Secondary | ICD-10-CM

## 2021-06-02 LAB — CBC WITH DIFFERENTIAL/PLATELET
Abs Immature Granulocytes: 0.04 10*3/uL (ref 0.00–0.07)
Basophils Absolute: 0 10*3/uL (ref 0.0–0.1)
Basophils Relative: 0 %
Eosinophils Absolute: 0 10*3/uL (ref 0.0–0.5)
Eosinophils Relative: 0 %
HCT: 30.7 % — ABNORMAL LOW (ref 36.0–46.0)
Hemoglobin: 10.7 g/dL — ABNORMAL LOW (ref 12.0–15.0)
Immature Granulocytes: 2 %
Lymphocytes Relative: 12 %
Lymphs Abs: 0.3 10*3/uL — ABNORMAL LOW (ref 0.7–4.0)
MCH: 34.5 pg — ABNORMAL HIGH (ref 26.0–34.0)
MCHC: 34.9 g/dL (ref 30.0–36.0)
MCV: 99 fL (ref 80.0–100.0)
Monocytes Absolute: 0.2 10*3/uL (ref 0.1–1.0)
Monocytes Relative: 6 %
Neutro Abs: 2 10*3/uL (ref 1.7–7.7)
Neutrophils Relative %: 80 %
Platelets: 50 10*3/uL — ABNORMAL LOW (ref 150–400)
RBC: 3.1 MIL/uL — ABNORMAL LOW (ref 3.87–5.11)
RDW: 13.9 % (ref 11.5–15.5)
WBC: 2.5 10*3/uL — ABNORMAL LOW (ref 4.0–10.5)
nRBC: 0 % (ref 0.0–0.2)

## 2021-06-02 LAB — URINALYSIS, ROUTINE W REFLEX MICROSCOPIC
Bilirubin Urine: NEGATIVE
Glucose, UA: NEGATIVE mg/dL
Ketones, ur: 5 mg/dL — AB
Nitrite: NEGATIVE
Protein, ur: 30 mg/dL — AB
Specific Gravity, Urine: 1.013 (ref 1.005–1.030)
pH: 5 (ref 5.0–8.0)

## 2021-06-02 LAB — STREP PNEUMONIAE URINARY ANTIGEN: Strep Pneumo Urinary Antigen: NEGATIVE

## 2021-06-02 LAB — SODIUM, URINE, RANDOM: Sodium, Ur: 15 mmol/L

## 2021-06-02 LAB — COMPREHENSIVE METABOLIC PANEL
ALT: 8 U/L (ref 0–44)
AST: 86 U/L — ABNORMAL HIGH (ref 15–41)
Albumin: 2.2 g/dL — ABNORMAL LOW (ref 3.5–5.0)
Alkaline Phosphatase: 65 U/L (ref 38–126)
Anion gap: 9 (ref 5–15)
BUN: 38 mg/dL — ABNORMAL HIGH (ref 8–23)
CO2: 24 mmol/L (ref 22–32)
Calcium: 7.3 mg/dL — ABNORMAL LOW (ref 8.9–10.3)
Chloride: 85 mmol/L — ABNORMAL LOW (ref 98–111)
Creatinine, Ser: 0.96 mg/dL (ref 0.44–1.00)
GFR, Estimated: >60 mL/min (ref >60)
Glucose, Bld: 106 mg/dL — ABNORMAL HIGH (ref 70–99)
Potassium: 5 mmol/L (ref 3.5–5.1)
Sodium: 118 mmol/L — CL (ref 135–145)
Total Bilirubin: 0.5 mg/dL (ref 0.3–1.2)
Total Protein: 6.5 g/dL (ref 6.5–8.1)

## 2021-06-02 LAB — RESPIRATORY PANEL BY PCR

## 2021-06-02 LAB — SODIUM
Sodium: 119 mmol/L — CL (ref 135–145)
Sodium: 125 mmol/L — ABNORMAL LOW (ref 135–145)
Sodium: 125 mmol/L — ABNORMAL LOW (ref 135–145)
Sodium: 124 mmol/L — ABNORMAL LOW (ref 135–145)

## 2021-06-02 LAB — OSMOLALITY, URINE: Osmolality, Ur: 418 mOsm/kg (ref 300–900)

## 2021-06-02 LAB — BRAIN NATRIURETIC PEPTIDE: B Natriuretic Peptide: 102.5 pg/mL — ABNORMAL HIGH (ref 0.0–100.0)

## 2021-06-02 LAB — TSH: TSH: 0.328 u[IU]/mL — ABNORMAL LOW (ref 0.350–4.500)

## 2021-06-02 LAB — PROTIME-INR
INR: 1.4 — ABNORMAL HIGH (ref 0.8–1.2)
Prothrombin Time: 16.9 seconds — ABNORMAL HIGH (ref 11.4–15.2)

## 2021-06-02 LAB — RESP PANEL BY RT-PCR (FLU A&B, COVID) ARPGX2
Influenza A by PCR: NEGATIVE
Influenza B by PCR: NEGATIVE
SARS Coronavirus 2 by RT PCR: NEGATIVE

## 2021-06-02 LAB — APTT: aPTT: 49 seconds — ABNORMAL HIGH (ref 24–36)

## 2021-06-02 LAB — LACTIC ACID, PLASMA: Lactic Acid, Venous: 1.1 mmol/L (ref 0.5–1.9)

## 2021-06-02 LAB — PHENYTOIN LEVEL, TOTAL: Phenytoin Lvl: 4.6 ug/mL — ABNORMAL LOW (ref 10.0–20.0)

## 2021-06-02 LAB — PROCALCITONIN: Procalcitonin: 1.5 ng/mL

## 2021-06-02 LAB — HIV ANTIBODY (ROUTINE TESTING W REFLEX): HIV Screen 4th Generation wRfx: NONREACTIVE

## 2021-06-02 MED ORDER — LEVOTHYROXINE SODIUM 75 MCG PO TABS: 175.0000 ug | ORAL_TABLET | Freq: Every day | ORAL | Status: DC

## 2021-06-02 MED ORDER — LEVETIRACETAM 100 MG/ML PO SOLN: 500.0000 mg | Freq: Two times a day (BID) | ORAL | Status: DC

## 2021-06-02 MED ORDER — LACTATED RINGERS IV SOLN: INTRAVENOUS | Status: DC

## 2021-06-02 MED ORDER — SIMVASTATIN 20 MG PO TABS
40.0000 mg | ORAL_TABLET | Freq: Every day | ORAL | Status: DC
  Administered 2021-06-02: 40 mg
  Filled 2021-06-02: qty 2

## 2021-06-02 MED ORDER — CANNABIDIOL 100 MG/ML PO SOLN
370.0000 mg | Freq: Two times a day (BID) | ORAL | Status: DC
  Administered 2021-06-02 – 2021-06-03 (×2): 370 mg via ORAL
  Filled 2021-06-02 (×4): qty 3.7

## 2021-06-02 MED ORDER — BUDESONIDE 0.5 MG/2ML IN SUSP
0.5000 mg | Freq: Two times a day (BID) | RESPIRATORY_TRACT | Status: DC
  Filled 2021-06-02 (×3): qty 2

## 2021-06-02 MED ORDER — CYCLOBENZAPRINE HCL 5 MG PO TABS
2.5000 mg | ORAL_TABLET | Freq: Every evening | ORAL | Status: DC | PRN
  Filled 2021-06-02: qty 0.5

## 2021-06-02 MED ORDER — CARBIDOPA-LEVODOPA 25-100 MG PO TABS
2.0000 | ORAL_TABLET | Freq: Three times a day (TID) | ORAL | Status: DC
  Administered 2021-06-02 – 2021-06-03 (×2): 2
  Filled 2021-06-02 (×6): qty 2

## 2021-06-02 MED ORDER — ACETAMINOPHEN 650 MG RE SUPP: 650.0000 mg | Freq: Four times a day (QID) | RECTAL | Status: DC | PRN

## 2021-06-02 MED ORDER — GUAIFENESIN 100 MG/5ML PO LIQD
400.0000 mg | Freq: Every day | ORAL | Status: DC
  Filled 2021-06-02: qty 20

## 2021-06-02 MED ORDER — PHENYTOIN 125 MG/5ML PO SUSP: 125.0000 mg | Freq: Two times a day (BID) | ORAL | Status: DC

## 2021-06-02 MED ORDER — PIPERACILLIN-TAZOBACTAM 3.375 G IVPB
3.3750 g | Freq: Three times a day (TID) | INTRAVENOUS | Status: DC
  Administered 2021-06-02 – 2021-06-03 (×3): 3.375 g via INTRAVENOUS
  Filled 2021-06-02 (×5): qty 50

## 2021-06-02 MED ORDER — ACETAMINOPHEN 500 MG PO TABS
1000.0000 mg | ORAL_TABLET | Freq: Once | ORAL | Status: AC
  Administered 2021-06-02: 1000 mg via ORAL
  Filled 2021-06-02: qty 2

## 2021-06-02 MED ORDER — PHENYTOIN 125 MG/5ML PO SUSP
200.0000 mg | Freq: Every day | ORAL | Status: DC
  Administered 2021-06-02: 200 mg
  Filled 2021-06-02: qty 8

## 2021-06-02 MED ORDER — METHYLPREDNISOLONE SODIUM SUCC 125 MG IJ SOLR
125.0000 mg | Freq: Once | INTRAMUSCULAR | Status: AC
  Administered 2021-06-02: 125 mg via INTRAVENOUS
  Filled 2021-06-02: qty 2

## 2021-06-02 MED ORDER — IPRATROPIUM-ALBUTEROL 0.5-2.5 (3) MG/3ML IN SOLN
3.0000 mL | Freq: Once | RESPIRATORY_TRACT | Status: AC
  Administered 2021-06-02: 3 mL via RESPIRATORY_TRACT
  Filled 2021-06-02: qty 3

## 2021-06-02 MED ORDER — LEVETIRACETAM 100 MG/ML PO SOLN
500.0000 mg | Freq: Every morning | ORAL | Status: DC
  Administered 2021-06-03: 500 mg
  Filled 2021-06-02: qty 5

## 2021-06-02 MED ORDER — PIPERACILLIN-TAZOBACTAM 3.375 G IVPB 30 MIN
3.3750 g | Freq: Once | INTRAVENOUS | Status: AC
  Administered 2021-06-02: 3.375 g via INTRAVENOUS
  Filled 2021-06-02: qty 50

## 2021-06-02 MED ORDER — SODIUM CHLORIDE 0.9 % IV BOLUS (SEPSIS)
1000.0000 mL | Freq: Once | INTRAVENOUS | Status: AC
  Administered 2021-06-02: 1000 mL via INTRAVENOUS

## 2021-06-02 MED ORDER — SODIUM CHLORIDE (HYPERTONIC) 5 % OP SOLN
1.0000 [drp] | Freq: Every day | OPHTHALMIC | Status: DC
  Filled 2021-06-02: qty 15

## 2021-06-02 MED ORDER — PHENYTOIN 125 MG/5ML PO SUSP
125.0000 mg | Freq: Every morning | ORAL | Status: DC
  Filled 2021-06-02 (×2): qty 5

## 2021-06-02 MED ORDER — FOLIC ACID 1 MG PO TABS
1.0000 mg | ORAL_TABLET | Freq: Every day | ORAL | Status: DC
  Administered 2021-06-03: 1 mg
  Filled 2021-06-02: qty 1

## 2021-06-02 MED ORDER — LEVETIRACETAM 100 MG/ML PO SOLN
1000.0000 mg | Freq: Every day | ORAL | Status: DC
  Administered 2021-06-02: 1000 mg
  Filled 2021-06-02 (×3): qty 10

## 2021-06-02 MED ORDER — ALBUTEROL SULFATE (2.5 MG/3ML) 0.083% IN NEBU: 2.5000 mg | INHALATION_SOLUTION | RESPIRATORY_TRACT | Status: DC | PRN

## 2021-06-02 MED ORDER — CANNABIDIOL 100 MG/ML PO SOLN
370.0000 mg | Freq: Two times a day (BID) | ORAL | Status: DC
  Filled 2021-06-02 (×2): qty 3.7

## 2021-06-02 MED ORDER — ACETAMINOPHEN 325 MG PO TABS
650.0000 mg | ORAL_TABLET | Freq: Four times a day (QID) | ORAL | Status: DC | PRN
  Administered 2021-06-03: 650 mg
  Filled 2021-06-02: qty 2

## 2021-06-02 MED ORDER — IPRATROPIUM-ALBUTEROL 0.5-2.5 (3) MG/3ML IN SOLN
3.0000 mL | Freq: Four times a day (QID) | RESPIRATORY_TRACT | Status: DC
  Administered 2021-06-02 – 2021-06-03 (×5): 3 mL via RESPIRATORY_TRACT
  Filled 2021-06-02 (×5): qty 3

## 2021-06-02 MED ORDER — SENNA 8.6 MG PO TABS: 2.0000 | ORAL_TABLET | Freq: Every day | ORAL | Status: DC | PRN

## 2021-06-02 MED ORDER — VITAMIN B-12 100 MCG PO TABS
100.0000 ug | ORAL_TABLET | Freq: Every day | ORAL | Status: DC
  Administered 2021-06-03: 100 ug
  Filled 2021-06-02: qty 1

## 2021-06-02 NOTE — Progress Notes (Signed)
Pharmacy Consulted Regarding Epidiolex Dosing   Pharmacy consulted regarding epidiolex dosing with elevated AST of 86. Discussed with Dr. Artis Flock and patient has been on this medication for multiple years per family report. Since ALT/AST < 3x ULN okay to continue home regimen of epidiolex 3.35ml (370 mg) twice daily. Continue to monitor LFTs and will need to discontinue if AST or ALT > 3x ULN.   Thank you for allowing pharmacy to be part of this patient's care.   Gerrit Halls, PharmD, BCPS Clinical Pharmacist 06-22-2021 6:38 PM

## 2021-06-02 NOTE — H&P (Signed)
History and Physical    Kaitlyn Powell ZOX:[id number] DOB: 1956/02/29 DOA: 2021/06/23  PCP: Tracey Harries, MD Consultants:  neurology: Dr. Karel Jarvis, pulmonology: Dr. Everardo All, EP: Dr. Johney Frame, GI: Dr. Lavon Paganini Patient coming from:  Home - lives with   Chief Complaint: shortness of breath with new oxygen requirement, fatigue, weakness.   HPI: Kaitlyn Powell is a 65 y.o. female with medical history significant of down syndrome/MR, Parkinsons dementia, OSA on bipap,  dementia, hypothyroidism, severe persistent asthma, PPM due to heart block, G tube feedings, hx of DVT and PE who presented to ED with shortness of breath, fatigue, weakness and respiratory failure. Her sister gives history as she is non verbal at baseline.  She tells me a few weeks ago she was making gurgling noises that sounded like it was in her stomach and grunting. They saw the GI doc and they thought it was respiratory and she had blood work and a CXR done. She then talked to her pulmonologist who didn't think her CXR was acutely concerning; however, patient began to go down hill. She was getting progressively short of breath, had crackles in her lungs and was just more lethargic so they called pulm on 10/27 and was given levaquin. She continued to get worse requiring bipap during the day  and her oxygen dropped to 85% on room air and they called 911.   She has not had fever/chills at home, had 99.8 temp this AM. ROS is unattainable as she is non verbal. Sister states she has had swelling in her ankles and gave her few doses of lasix. She has had no vomiting and diarrhea.     ED Course: vitals: Temperature 101.4, blood pressure 127/56, heart rate 81, respiratory rate 26, oxygen 100% on 15 L nonrebreather mask. Labs: WBC 2.5, hemoglobin 10.7, platelets 50, sodium 118, chloride 85, BUN 38, AST 86, INR 1.4, Chest x-ray, suggest bilateral pneumonia.  Bilateral hilar regions and right paratracheal soft tissues appear more prominent  possibility of lymphadenopathy is not excluded.  Possible small left pleural effusion. In ED patient bolused with 2 L, placed on maintenance fluids, given Zosyn, DuoNeb, and Solu-Medrol.  TRH was asked to admit.  Review of Systems: As per HPI; otherwise review of systems reviewed and negative.   Ambulatory Status:  mainly bed bound, but can shuffle with max assist.    Past Medical History:  Diagnosis Date   Arthritis    Aspiration pneumonia (HCC)    Asthma    Cardiac arrhythmia    have pacemeaker   Down syndrome    DVT of popliteal vein (HCC) 09/08/2018   chronic   GERD (gastroesophageal reflux disease)    Heart block    Hypothyroidism    Mental retardation    Pacemaker    Parkinson's disease (HCC) 05/2019   Pulmonary emboli (HCC)    Seizures (HCC)    Sleep apnea with use of continuous positive airway pressure (CPAP)     Past Surgical History:  Procedure Laterality Date   IR GASTROSTOMY TUBE MOD SED  10/04/2019   IR RADIOLOGIST EVAL & MGMT  02/07/2020   IR RADIOLOGIST EVAL & MGMT  02/28/2020   IR REPLC GASTRO/COLONIC TUBE PERCUT W/FLUORO  12/14/2019   IR REPLC GASTRO/COLONIC TUBE PERCUT W/FLUORO  01/04/2020   IR REPLC GASTRO/COLONIC TUBE PERCUT W/FLUORO  03/19/2020   PACEMAKER INSERTION  12/09/2015   [employer] Accolade MRI EL PPM implanted in [place] for complete heart block and syncope   PEG TUBE PLACEMENT  removed in 2017   TIBIA FRACTURE SURGERY Right     Social History   Socioeconomic History   Marital status: Single    Spouse name: Not on file   Number of children: Not on file   Years of education: Not on file   Highest education level: Not on file  Occupational History   Occupation: disabled  Tobacco Use   Smoking status: Never   Smokeless tobacco: Never  Vaping Use   Vaping Use: Never used  Substance and Sexual Activity   Alcohol use: No   Drug use: No   Sexual activity: Never  Other Topics Concern   Not on file  Social History Narrative   Pt is  left handed   Lives in 2 story home with her sister, brother-in-law and BIL's brother (pt stays on bottom floor)   Social Determinants of Health   Financial Resource Strain: Not on file  Food Insecurity: Not on file  Transportation Needs: Not on file  Physical Activity: Not on file  Stress: Not on file  Social Connections: Not on file  Intimate Partner Violence: Not on file    Allergies  Allergen Reactions   Cephalexin Other (See Comments)    Seizures    Namenda [Memantine Hcl] Other (See Comments)    Made memory issues worse, sleepiness, increased confusion    Family History  Problem Relation Age of Onset   Diabetes Father    Heart disease Father    Breast cancer Sister     Prior to Admission medications   Medication Sig Start Date End Date Taking? Authorizing Provider  albuterol (PROVENTIL) (2.5 MG/3ML) 0.083% nebulizer solution Take 2.5 mg by nebulization every 6 (six) hours as needed for wheezing or shortness of breath.    [provider]  budesonide (PULMICORT) 0.5 MG/2ML nebulizer solution INHALE 1 VIAL BY NEBULIZATION IN THE MORNING AND AT BEDTIME. 08/14/20   Luciano Cutter, MD  cannabidiol (EPIDIOLEX) 100 MG/ML solution GIVE 3.7 ML BY MOUTH 2 TIMES A DAY. 04/14/21   Van Clines, MD  carbidopa-levodopa (SINEMET IR) 25-100 MG tablet PLACE 2 TABLETS INTO FEEDING TUBE 3 (THREE) TIMES DAILY. TAKE 2 TABLET THREE TIMES A DAY WITH MEALS 04/02/21   Van Clines, MD  cyclobenzaprine (FLEXERIL) 5 MG tablet Take 1/2 to 1 tablet at night as needed for muscle spasms 12/04/20   Van Clines, MD  diclofenac Sodium (VOLTAREN) 1 % GEL Apply 2 g topically 4 (four) times daily. To affected joint. Patient taking differently: Apply 2 g topically 4 (four) times daily as needed. To affected joint. 07/19/20   Judi Saa, DO  folic acid (FOLVITE) 1 MG tablet Take 1 mg by mouth daily. 02/03/20   [provider]  furosemide (LASIX) 20 MG tablet TAKE 1 TABLET BY MOUTH  DAILY FOR 3 DAYS. Patient taking differently: daily as needed. 02/25/21   Luciano Cutter, MD  Incontinence Supplies (EXTENSION TUBING/CONNECTOR) MISC Use with PEG feedings change every 5 days Mic-key extension 04/18/20   Esterwood, Amy S, PA-C  ipratropium-albuterol (DUONEB) 0.5-2.5 (3) MG/3ML SOLN INHALE 1 VIAL VIA NEBULIZATION EVERY 6 HOURS AS NEEDED 05/28/21   Luciano Cutter, MD  levETIRAcetam (KEPPRA) 100 MG/ML solution GIVE (500MG ) IN AM, (1000MG ) IN PM 05/23/21   Van Clines, MD  levofloxacin (LEVAQUIN) 25 MG/ML solution Take 20 mLs (500 mg total) by mouth daily for 7 doses. 05/29/21 06/05/21  Luciano Cutter, MD  levothyroxine (SYNTHROID) 150 MCG  tablet Place 150 mcg into feeding tube daily before breakfast.    [provider]  NONFORMULARY OR COMPOUNDED ITEM Antifungal solution: Terbinafine 3%, Fluconazole 2%, Tea Tree Oil 5%, Urea 10%, Ibuprofen 2% in DMSO suspension #33mL Patient not taking: Reported on 05/16/2021 02/13/20   Freddie Breech, DPM  Nutritional Supplements (FEEDING SUPPLEMENT, OSMOLITE 1.2 CAL,) LIQD 1 can of Osmolite 1.2 bolus feeding daily Disp 30 day supply refill 1 year 09/10/20   Napoleon Form, MD  OXYGEN Inhale 3 L into the lungs as directed. With CPAP    [provider]  phenytoin (DILANTIN) 125 MG/5ML suspension Take 56mL  in AM, 27mL (200mg ) in PM 05/09/21   07/09/21, MD  rivaroxaban (XARELTO) 20 MG TABS tablet Take 1 tablet (20 mg total) by mouth daily with supper. 02/19/21   Allred, 02/21/21, MD  scopolamine (TRANSDERM SCOP, 1.5 MG,) 1 MG/3DAYS Place 1 patch (1.5 mg total) onto the skin every 3 (three) days. 04/14/21   06/14/21, MD  Sennosides (SENNA) 8.8 MG/5ML SYRP TAKE 10 MLS BY MOUTH DAILY AS NEEDED 05/05/21   07/05/21, MD  simvastatin (ZOCOR) 40 MG tablet TAKE 1 TABLET (40 MG TOTAL) BY MOUTH AT BEDTIME. PLEASE KEEP UPCOMING APPT FOR FUTURE REFILLS. 01/13/21   Allred, 01/15/21, MD  sodium chloride (MURO  128) 5 % ophthalmic ointment Place 1 application into both eyes at bedtime.    [provider]  SYRINGE DISPOSABLE 60CC 60 ML MISC 1 syringe per feeding three times a day 04/18/20   Esterwood, Amy S, PA-C  vitamin B-12 (CYANOCOBALAMIN) 100 MCG tablet Place 100 mcg into feeding tube daily.  12/26/19   [provider]  Omeprazole Magnesium (PRILOSEC) 10 MG PACK 20 mg by PEG Tube route daily before breakfast. 07/01/20 07/03/20  14/1/21, MD    Physical Exam: Vitals:   05/22/2021 1115 05/12/2021 1142 05/06/2021 1145 05/03/2021 1245  BP: 112/68  111/60 101/64  Pulse: 81 89 78 71  Resp: (!) 26 (!) 26 (!) 26 (!) 26  Temp:    99.8 F (37.7 C)  TempSrc:    Rectal  SpO2: (!) 89% 96% 97% 100%  Weight:      Height:         General:  Appears calm and comfortable and is in NAD Eyes:  PERRL, EOMI, normal lids, iris ENT:   lips & tongue, mmm; appropriate dentition Neck:  no LAD, masses or thyromegaly; no carotid bruits Cardiovascular:  RRR, no m/r/g. No LE edema.  Respiratory:   CTA bilaterally with what sounds like a supraglottic expiratory wheeze. Moving air. Exam limited as she will not sit up.  Normal respiratory effort. Abdomen:  soft, NT, ND, NABS Back:   normal alignment Skin:  no rash or induration seen on limited exam Musculoskeletal:  can not assess. no bony abnormality Lower extremity:  No LE edema.  Limited foot exam with no ulcerations.  2+ distal pulses. Psychiatric:   nonverbal, at baseline.  Neurologic:  can not assess.     Radiological Exams on Admission: Independently reviewed - see discussion in A/P where applicable  DG Chest Port 1 View  Result Date: 05/22/2021 CLINICAL DATA:  Altered mental status EXAM: PORTABLE CHEST 1 VIEW COMPARISON:  Previous studies including the examination of 05/15/2021 FINDINGS: Transverse diameter of heart is in the upper limits of normal. Thoracic aorta is tortuous and ectatic. Patchy infiltrates are seen in the parahilar  regions and lower lung fields, more  so on the left side with interval worsening. Both hilar regions appear more prominent. Left lateral CP angle is indistinct. There is no pneumothorax. Deformities are seen in the left clavicle and left upper ribs with no significant change. Pacemaker battery is seen in right infraclavicular region. IMPRESSION: There is interval increase in patchy infiltrates in the parahilar regions and lower lung fields, more so on the left side suggesting bilateral pneumonia. Both hilar regions and right paratracheal soft tissues appear more prominent. Possibility of lymphadenopathy is not excluded. Possible small left pleural effusion. Electronically Signed   By: Ernie Avena M.D.   On: 05/27/2021 10:11    EKG: Independently reviewed.  NSR with rate 82; nonspecific ST changes with no evidence of acute ischemia   Labs on Admission: I have personally reviewed the available labs and imaging studies at the time of the admission.  Pertinent labs:  WBC 2.5,  hemoglobin 10.7,  platelets 50,  sodium 118,  chloride 85,  BUN 38,  AST 86,  INR 1.4,    Assessment/Plan Principal Problem:   Sepsis due to pneumonia [facility]) -65 year old female presenting with sepsis (WBC <4000, fever, RR>20) likely secondary to pneumonia based off fever, decreased oxygen saturation and CXR findings.  -admit to progressive -requiring HHFNC at 30L -CXR findings, shortness of breath concerning for pneumonia. Also has hx of aspiration and is at risk for aspiration pneumonia -checking Respiratory viral panel and urinary antigens.  -blood cultures -procalcitonin to help guide abx therapy -not doing cefepime as it lowers seizure threshold. Discussed with pharmacy and will continue zosyn at this time for broad coverage, but can hopefully tailor as indicated. No known hx of MDR risk.  -continue scheduled duonebs -IVF and follow CBC  Active Problems:    Acute on chronic respiratory failure with  hypoxia  -Has been on supplemental oxygen as needed to keep oxygen greater than 88%. -Her shortness of breath and oxygenation was requiring BiPAP at home and she was satting around 85% on room air.  States the nasal cannula was not doing much that is why they changed to BiPAP -She is maintaining oxygenation on heated high flow oxygen at 30L nasal cannula -Likely secondary to pneumonia -has expiratory wheeze, but sounds more supraglottic in nature. Asthma exacerbation on differential, but do not believe she is in exacerbation at this time.  -on xarelto with no missed doses making PE very low on differential.  -Wean as tolerated and treat underlying causes.    Hyponatremia -Sodium has been trending down as well over the past few months.  Was 121 in September 2022 at her PCP office and was sent to ED.  She was not admitted at that time appears to be 125-131 at baseline over past year. -Sodium 118 on arrival to ED repeat pending -appears to be hypotonic hyponatremia, checking urine sodium and urine osmolality -repeat 119: consulted nephrology     Thrombocytopenia (HCC) --has been trending downward. Was 77 at PCP office in 04/2021.  -? Sepsis, platelets 50 today.  -last dose of xarelto yesterday. Discussed with pharmacy. Hold xarelto dose tonight and repeat CBC in AM, if over 50 could do heparin gtt.     Severe persistent asthma -She appears to be moving air although mildly exam is quite limited.  She has no inspiratory wheezing and expiratory wheeze sounds more supraglottic in nature. Do not think she is in an exacerbation at this time, but with pneumonia could easily be thrown into exacerbation.  -Give Solu-Medrol in ED.  We will hold on steroids at this time with active infection but follow clinically as she may need them -Continue scheduled DuoNebs, Pulmicort and albuterol as needed    Seizures (HCC) -Sister she has had some episodes of twisting and jerking movements however these have seemed  to have resolved -will check a Dilantin level and CT head. Continue keppra and dilantin. Pharmacy to dose epidiolex with elevated AST.  -Seizure precautions -Avoiding cefepime as this lowers the seizure threshold -If happens again would benefit from neurology consult    Acquired hypothyroidism -check TSH-abnormal in 9/22, continue synthroid     Anemia due to medication -Iron studies done this month showed normal B12 folate -iron: 97, transferrin: 215, %sat: 32, ferritin: 138, TIBC: 301    History of pulmonary embolism/DVT Xarelto  Platelets of 50, monitor.     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  -Dr. Archer Asa is her IR doctor and he is on call today, will call and see if they can do this while in patient. (Sent secure chat, in procedure)    Pacemaker  Stable, on telemetry Normal device function.    OSA treated with BiPAP Bipap qhs.   Body mass index is 32.51 kg/m.   Level of care: Progressive DVT prophylaxis:   TED hose. Xarelto on hold due to platelets 50K for today Code Status: partial code. DNI confirmed with family Family Communication: sister present at bedside: Kaitlyn Powell  Disposition Plan:  The patient is from: home  Anticipated d/c is to: per day team    Requires inpatient hospitalization and is at significant risk of worsening, requires constant monitoring, assessment and MDM with specialists.     Patient is currently: acutely ill Consults called:  IR/nephrology  Admission status:  inpatient   Dragon dictation used in completing this note.    Orland Mustard MD [facility]   How to contact the [facility] Attending or Consulting provider 7A - 7P or covering provider during after hours 7P -7A, for this patient?  Check the care team in [facility] and look  for a) attending/consulting [facility] provider listed and b) the [facility] team listed Log into [email or web address] and use [facility] universal password to access. If you do not have the password, please contact the hospital operator. Locate the [facility] provider you are looking for under [facility] and page to a number that you can be directly reached. If you still have difficulty reaching the provider, please page the [facility] (Director on Call) for the Hospitalists listed on amion for assistance.   06/27/2021, 1:18 PM

## 2021-06-02 NOTE — Sepsis Progress Note (Signed)
[facility] is monitoring this sepsis 

## 2021-06-02 NOTE — ED Triage Notes (Signed)
Pt arrives via EMS from home for c/o deteriorating mental status and difficulty breathing that has worsening over the last week. Pt has down syndrome and parkinson's is cared for at home by brother and sister. Per family pt is being tx for pneumonia and possible UTI over the last week. Taking antibiotics at home. EMS reports that pt was on Bipap upon arrival. Pt normally wear 3 L BiPap at night per family but has been requiring O2 during the day over the last week. Pt baseline mental status is alert and looking around. Normally grips hands. Pt has no extremity movement at this time. Eyes open upon arrival.

## 2021-06-02 NOTE — Consult Note (Addendum)
Nephrology Consult   Requesting provider: Orma Flaming Service requesting consult: Hospitalist Reason for consult: Hyponatremia   Assessment/Recommendations: [name] is a/an 65 y.o. female with a past medical history down syndrome, Parkinson's, OSA, dementia, hypothyroidism, asthma, G-tube dependence, DVT and PE  Hyponatremia: chronic for the past 6 months or so. It's unclear to me what the cause is but I would suspect SIADH from recurrent/ongoing pulmonary disease. At this time she appears dry on exam so hypovolemic component also likely. I see no culprit SIADH meds at home -Repeat Na now and then in 6 hours -Goal Na is 125 by 9am tomorrow morning -Continue LR for now at 150cc/hr and adjust therapy based on response -F/u Urine Na and Osm -F/u TSH; cortisol of low utility s/p steroids -Likely benefit from Urea/Na tabs going forward  Volume Status: Appears dry. Hydration as above  Bilateral pneumonia: Antibiotics and management per primary team  Acute hypoxic respiratory failure: Likely secondary to pneumonia.  Management per primary team with supplemental oxygen and antibiotics.  Nebulizers and steroids  Parkinson's/Down syndrome: Management per primary  GOC: Overall decline with medical comorbidities.  May be beneficial to have palliative care stop by while she is here.  Recommendations conveyed to primary service.   ADDENDUM: Sodium returned at 125 after aggressive fluid resuscitation.  Do not want to go much higher than this until tomorrow morning.  We will stop lactated Ringer's and recheck sodium at 1800 then 2200 to ensure stability. May start d5w if Na corrects further.  [facility] 05/12/2021 2:57 PM   _____________________________________________________________________________________ CC: Hyponatremia, acute hypoxic respiratory failure, fatigue, altered mental status  History of Present Illness: Kaitlyn Powell is a/an 65 y.o.  female with a past medical history of down syndrome, Parkinson's, OSA, dementia, hypothyroidism, asthma, G-tube dependence, DVT and PE who presents with worsening fatigue, confusion, hypoxia..  At baseline the patient is nonverbal.  History was obtained per chart review and from the sister.  The patient has several chronic diseases and lives with family.  Palliative care has been involved in the past.  Patient has a history of aspiration pneumonia and follows with pulmonology.  She is G-tube dependent.  Recently the patient was having some gurgling sounds and ultimately underwent a chest x-ray which was concerning for pneumonia.  She had progressive shortness of breath and was given Levaquin late last week.  Despite this the patient failed to improve.  She required BiPAP at home over the weekend.  She usually uses BiPAP at night but was requiring it during the day.  Sister states that the patient has also been more confused and lethargic.  They have been dealing with low sodium intermittently over the past month or 2.  The sister was mixing salt water and using this for hydration via G-tube at the request of her outpatient provider.  No thiazide diuretic.  Sodium was normal in March 2021 but ever since the end of that month sodium has been below normal around 125-130 but a fair amount of variability.    In the emergency department the patient was noted to be febrile.  COVID was negative.  Chest x-ray concerning for pneumonia.  Sodium on arrival to the emergency department was 118.  She has received over 2 L of fluid and is on LR at 150cc/hr.  Urine studies have been obtained but are pending.  BNP essentially normal. She has been given broad spectrum abx and steroids.   Medications:  Current Facility-Administered Medications  Medication Dose Route Frequency Provider Last Rate Last Admin   acetaminophen (TYLENOL) tablet 650 mg  650 mg Per Tube Q6H PRN Orma Flaming, MD       Or   acetaminophen (TYLENOL)  suppository 650 mg  650 mg Rectal Q6H PRN Orma Flaming, MD       albuterol (PROVENTIL) (2.5 MG/3ML) 0.083% nebulizer solution 2.5 mg  2.5 mg Nebulization Q4H PRN Orma Flaming, MD       ipratropium-albuterol (DUONEB) 0.5-2.5 (3) MG/3ML nebulizer solution 3 mL  3 mL Nebulization Q6H Orma Flaming, MD   3 mL at 05/06/2021 1419   lactated ringers infusion   Intravenous Continuous Horton, Alvin Critchley, DO 150 mL/hr at 05/25/2021 1331 New Bag at 05/19/2021 1331   Current Outpatient Medications  Medication Sig Dispense Refill   budesonide (PULMICORT) 0.5 MG/2ML nebulizer solution INHALE 1 VIAL BY NEBULIZATION IN THE MORNING AND AT BEDTIME. 120 mL 11   cannabidiol (EPIDIOLEX) 100 MG/ML solution GIVE 3.7 ML BY MOUTH 2 TIMES A DAY. 222 mL 5   carbidopa-levodopa (SINEMET IR) 25-100 MG tablet PLACE 2 TABLETS INTO FEEDING TUBE 3 (THREE) TIMES DAILY. TAKE 2 TABLET THREE TIMES A DAY WITH MEALS 216 tablet 0   cyclobenzaprine (FLEXERIL) 5 MG tablet Take 1/2 to 1 tablet at night as needed for muscle spasms 30 tablet 4   Dextran 70-Hypromellose (ARTIFICIAL TEARS) 0.1-0.3 % SOLN Apply 1 drop to eye 2 (two) times daily as needed (dry eyes).     diclofenac Sodium (VOLTAREN) 1 % GEL Apply 2 g topically 4 (four) times daily. To affected joint. [id number] g 6   folic acid (FOLVITE) 1 MG tablet Take 1 mg by mouth daily.     furosemide (LASIX) 20 MG tablet TAKE 1 TABLET BY MOUTH DAILY FOR 3 DAYS. (Patient taking differently: Take 20 mg by mouth daily as needed for fluid.) 30 tablet 0   GUAIFENESIN PO Place 400 mg into feeding tube in the morning, at noon, and at bedtime.     ipratropium-albuterol (DUONEB) 0.5-2.5 (3) MG/3ML SOLN INHALE 1 VIAL VIA NEBULIZATION EVERY 6 HOURS AS NEEDED (Patient taking differently: Take 3 mLs by nebulization every 6 (six) hours as needed (sob/wheezing).) 360 mL 12   levETIRAcetam (KEPPRA) 100 MG/ML solution GIVE 5ML (500MG ) IN AM, 10ML (1000MG ) IN PM 473 mL 0   levofloxacin (LEVAQUIN) 25 MG/ML solution  Take 20 mLs (500 mg total) by mouth daily for 7 doses. 140 mL 0   levothyroxine (SYNTHROID) 175 MCG tablet Place 175 mcg into feeding tube daily before breakfast.     Nutritional Supplements (FEEDING SUPPLEMENT, OSMOLITE 1.2 CAL,) LIQD 1 can of Osmolite 1.2 bolus feeding daily Disp 30 day supply refill 1 year 237 mL 11   phenytoin (DILANTIN) 125 MG/5ML suspension Take 32mL  in AM, 17mL (200mg ) in PM 1080 mL 3   rivaroxaban (XARELTO) 20 MG TABS tablet Take 1 tablet (20 mg total) by mouth daily with supper. 30 tablet 5   Sennosides (SENNA) 8.8 MG/5ML SYRP TAKE 10 MLS BY MOUTH DAILY AS NEEDED (Patient taking differently: Take 10 mLs by mouth daily as needed (constipation).) 237 mL 1   simvastatin (ZOCOR) 40 MG tablet TAKE 1 TABLET (40 MG TOTAL) BY MOUTH AT BEDTIME. PLEASE KEEP UPCOMING APPT FOR FUTURE REFILLS. 90 tablet 3   sodium chloride (MURO 128) 5 % ophthalmic ointment Place 1 application into both eyes at bedtime.     vitamin B-12 (CYANOCOBALAMIN) 100 MCG tablet Place 100 mcg into feeding tube  daily.      Incontinence Supplies (EXTENSION TUBING/CONNECTOR) MISC Use with PEG feedings change every 5 days Mic-key extension 6 each 11   NONFORMULARY OR COMPOUNDED ITEM Antifungal solution: Terbinafine 3%, Fluconazole 2%, Tea Tree Oil 5%, Urea 10%, Ibuprofen 2% in DMSO suspension #50mL (Patient not taking: No sig reported) 1 each 3   OXYGEN Inhale 3 L into the lungs as directed. With CPAP     scopolamine (TRANSDERM SCOP, 1.5 MG,) 1 MG/3DAYS Place 1 patch (1.5 mg total) onto the skin every 3 (three) days. 24 patch 2   SYRINGE DISPOSABLE 60CC 60 ML MISC 1 syringe per feeding three times a day 90 each 12     ALLERGIES Cephalexin and Namenda [memantine hcl]  MEDICAL HISTORY Past Medical History:  Diagnosis Date   Arthritis    Aspiration pneumonia (HCC)    Asthma    Cardiac arrhythmia    have pacemeaker   Down syndrome    DVT of popliteal vein (HCC) 09/08/2018   chronic   GERD (gastroesophageal  reflux disease)    Heart block    Hypothyroidism    Mental retardation    Pacemaker    Parkinson's disease ([place]) 05/2019   Pulmonary emboli (HCC)    Seizures (HCC)    Sleep apnea with use of continuous positive airway pressure (CPAP)      SOCIAL HISTORY Social History   Socioeconomic History   Marital status: Single    Spouse name: Not on file   Number of children: Not on file   Years of education: Not on file   Highest education level: Not on file  Occupational History   Occupation: disabled  Tobacco Use   Smoking status: Never   Smokeless tobacco: Never  Vaping Use   Vaping Use: Never used  Substance and Sexual Activity   Alcohol use: No   Drug use: No   Sexual activity: Never  Other Topics Concern   Not on file  Social History Narrative   Pt is left handed   Lives in 2 story home with her sister, brother-in-law and BIL's brother (pt stays on bottom floor)   Social Determinants of Health   Financial Resource Strain: Not on file  Food Insecurity: Not on file  Transportation Needs: Not on file  Physical Activity: Not on file  Stress: Not on file  Social Connections: Not on file  Intimate Partner Violence: Not on file     FAMILY HISTORY Family History  Problem Relation Age of Onset   Diabetes Father    Heart disease Father    Breast cancer Sister       Review of Systems: unable to obtain due to the patient's altered mental status  Physical Exam: Vitals:   05/26/2021 1336 05/10/2021 1337  BP:    Pulse:  66  Resp:  (!) 22  Temp:    SpO2: 100% 94%   Total I/O In: 1150 [IV Piggyback:1150] Out: 400 [Urine:400]  Intake/Output Summary (Last 24 hours) at 05/23/2021 1457 Last data filed at 05/04/2021 1330 Gross per 24 hour  Intake 1150 ml  Output 400 ml  Net 750 ml   General: Acute and chronically ill-appearing, lying in bed HEENT: Down syndrome features, dry mucous membranes, anicteric sclera, oropharynx clear without lesions CV: regular rate,  normal rhythm, no murmurs, nonpitting edema at the ankles bilaterally Lungs: Coarse bilateral breath sounds throughout with expiratory wheezing, mild increased work of breathing Abd: soft, non-tender, non-distended Skin: no visible lesions or rashes Psych:  Awake and alert, calm and cooperative Musculoskeletal: no obvious deformities Neuro: Nonverbal, does not follow commands  Test Results Reviewed Lab Results  Component Value Date   NA 118 (LL) 06/19/21   NA 119 (LL) 2021/06/19   K 5.0 June 19, 2021   CL 85 (L) Jun 19, 2021   CO2 24 Jun 19, 2021   BUN 38 (H) 2021-06-19   CREATININE 0.96 19-Jun-2021   GFR 91.68 05/15/2021   CALCIUM 7.3 (L) 06/19/21   ALBUMIN 2.2 (L) 2021-06-19   PHOS 5.3 (H) 10/10/2019     I have reviewed all relevant outside healthcare records related to the patient's current hospitalization

## 2021-06-02 NOTE — ED Provider Notes (Signed)
[facility] EMERGENCY DEPARTMENT Provider Note   CSN: [id number] Arrival date & time: 2021-06-20  1324     History Chief Complaint  Patient presents with   Code Sepsis   Altered Mental Status    Kaitlyn Powell is a 65 y.o. female.  HPI  65 year old female with past medical history of mental retardation, Down syndrome, Parkinson's, dementia, DVT/PE anticoagulated on Xarelto, OSA uses BiPAP at night, currently being treated for pneumonia/UTI on Levaquin presents the emergency department accompanied by sister who is the primary caregiver for worsening shortness of breath and mental status change.  Patient is currently nonverbal, history obtained from sister.  At baseline patient is minimally verbal but will use upper extremities.  Sister states that last week she was diagnosed with UTI and pneumonia, placed on Levaquin of which she has been taking with breathing treatments.  However her respiratory status has been declining and she has been requiring BiPAP throughout the day as well.  Patient now is completely nonverbal or using her upper extremities which is a change for her.  Sister endorses intermittent fever at home, diarrhea due to medications.  Past Medical History:  Diagnosis Date   Arthritis    Aspiration pneumonia (HCC)    Asthma    Cardiac arrhythmia    have pacemeaker   Down syndrome    DVT of popliteal vein (HCC) 09/08/2018   chronic   GERD (gastroesophageal reflux disease)    Heart block    Hypothyroidism    Mental retardation    Pacemaker    Parkinson's disease (HCC) 05/2019   Pulmonary emboli (HCC)    Seizures (HCC)    Sleep apnea with use of continuous positive airway pressure (CPAP)     Patient Active Problem List   Diagnosis Date Noted   Terminal respiratory secretions 04/28/2021   History of aspiration pneumonia 04/28/2021   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) 02/11/2021   Community acquired bacterial pneumonia 01/02/2021   Severe  persistent asthma 05/30/2020   Left ankle pain 02/20/2020   Gastrostomy status (HCC)    Moderate malnutrition (HCC)    Dysphagia    Advanced care planning/counseling discussion    Goals of care, counseling/discussion    Palliative care by specialist    Cellulitis of left hand 04/04/2019   Left hand pain 04/04/2019   Heart block AV complete (HCC) 01/19/2019   Chronic deep vein thrombosis (DVT) of right popliteal vein (HCC) 09/28/2018   Localization-related (focal) (partial) symptomatic epilepsy and epileptic syndromes with complex partial seizures, intractable, with status epilepticus (HCC) 09/07/2018   Dehydration 08/01/2018   Functional urinary incontinence 07/22/2017   OSA treated with BiPAP 12/31/2016   Mixed conductive and sensorineural hearing loss of both ears 08/05/2016   Seizures (HCC) 07/03/2016   Bilateral hearing loss 07/03/2016   Down syndrome 05/10/2016   Anemia due to medication 05/10/2016   Acquired hypothyroidism 05/10/2016   Elevated lipase 05/10/2016   History of pulmonary embolism 05/10/2016   G tube feedings (HCC) 05/10/2016   Pacemaker 05/10/2016   S/P percutaneous endoscopic gastrostomy (PEG) tube placement (HCC) 05/10/2016    Past Surgical History:  Procedure Laterality Date   IR GASTROSTOMY TUBE MOD SED  10/04/2019   IR RADIOLOGIST EVAL & MGMT  02/07/2020   IR RADIOLOGIST EVAL & MGMT  02/28/2020   IR REPLC GASTRO/COLONIC TUBE PERCUT W/FLUORO  12/14/2019   IR REPLC GASTRO/COLONIC TUBE PERCUT W/FLUORO  01/04/2020   IR REPLC GASTRO/COLONIC TUBE PERCUT W/FLUORO  03/19/2020  PACEMAKER INSERTION  12/09/2015   [employer] Accolade MRI EL PPM implanted in [place] for complete heart block and syncope   PEG TUBE PLACEMENT     removed in 2017   TIBIA FRACTURE SURGERY Right      OB History   No obstetric history on file.     Family History  Problem Relation Age of Onset   Diabetes Father    Heart disease Father    Breast cancer Sister     Social History    Tobacco Use   Smoking status: Never   Smokeless tobacco: Never  Vaping Use   Vaping Use: Never used  Substance Use Topics   Alcohol use: No   Drug use: No    Home Medications Prior to Admission medications   Medication Sig Start Date End Date Taking? Authorizing Provider  albuterol (PROVENTIL) (2.5 MG/3ML) 0.083% nebulizer solution Take 2.5 mg by nebulization every 6 (six) hours as needed for wheezing or shortness of breath.    [provider]  budesonide (PULMICORT) 0.5 MG/2ML nebulizer solution INHALE 1 VIAL BY NEBULIZATION IN THE MORNING AND AT BEDTIME. 08/14/20   Luciano Cutter, MD  cannabidiol (EPIDIOLEX) 100 MG/ML solution GIVE 3.7 ML BY MOUTH 2 TIMES A DAY. 04/14/21   Van Clines, MD  carbidopa-levodopa (SINEMET IR) 25-100 MG tablet PLACE 2 TABLETS INTO FEEDING TUBE 3 (THREE) TIMES DAILY. TAKE 2 TABLET THREE TIMES A DAY WITH MEALS 04/02/21   Van Clines, MD  cyclobenzaprine (FLEXERIL) 5 MG tablet Take 1/2 to 1 tablet at night as needed for muscle spasms 12/04/20   Van Clines, MD  diclofenac Sodium (VOLTAREN) 1 % GEL Apply 2 g topically 4 (four) times daily. To affected joint. Patient taking differently: Apply 2 g topically 4 (four) times daily as needed. To affected joint. 07/19/20   Judi Saa, DO  folic acid (FOLVITE) 1 MG tablet Take 1 mg by mouth daily. 02/03/20   [provider]  furosemide (LASIX) 20 MG tablet TAKE 1 TABLET BY MOUTH DAILY FOR 3 DAYS. Patient taking differently: daily as needed. 02/25/21   Luciano Cutter, MD  Incontinence Supplies (EXTENSION TUBING/CONNECTOR) MISC Use with PEG feedings change every 5 days Mic-key extension 04/18/20   Esterwood, Amy S, PA-C  ipratropium-albuterol (DUONEB) 0.5-2.5 (3) MG/3ML SOLN INHALE 1 VIAL VIA NEBULIZATION EVERY 6 HOURS AS NEEDED 05/28/21   Luciano Cutter, MD  levETIRAcetam (KEPPRA) 100 MG/ML solution GIVE (500MG ) IN AM, (1000MG ) IN PM 05/23/21   , MD   levofloxacin (LEVAQUIN) 25 MG/ML solution Take 20 mLs (500 mg total) by mouth daily for 7 doses. 05/29/21 06/05/21  05/31/21, MD  levothyroxine (SYNTHROID) 150 MCG tablet Place 150 mcg into feeding tube daily before breakfast.    [provider]  NONFORMULARY OR COMPOUNDED ITEM Antifungal solution: Terbinafine 3%, Fluconazole 2%, Tea Tree Oil 5%, Urea 10%, Ibuprofen 2% in DMSO suspension #44mL Patient not taking: Reported on 05/16/2021 02/13/20   05/18/2021, DPM  Nutritional Supplements (FEEDING SUPPLEMENT, OSMOLITE 1.2 CAL,) LIQD 1 can of Osmolite 1.2 bolus feeding daily Disp 30 day supply refill 1 year 09/10/20   Freddie Breech, MD  OXYGEN Inhale 3 L into the lungs as directed. With CPAP    [provider]  phenytoin (DILANTIN) 125 MG/5ML suspension Take 61mL  in AM, 64mL (200mg ) in PM 05/09/21   4m, MD  rivaroxaban (XARELTO) 20 MG TABS tablet Take  1 tablet (20 mg total) by mouth daily with supper. 02/19/21   Allred, Fayrene Fearing, MD  scopolamine (TRANSDERM SCOP, 1.5 MG,) 1 MG/3DAYS Place 1 patch (1.5 mg total) onto the skin every 3 (three) days. 04/14/21   Luciano Cutter, MD  Sennosides (SENNA) 8.8 MG/5ML SYRP TAKE 10 MLS BY MOUTH DAILY AS NEEDED 05/05/21   Napoleon Form, MD  simvastatin (ZOCOR) 40 MG tablet TAKE 1 TABLET (40 MG TOTAL) BY MOUTH AT BEDTIME. PLEASE KEEP UPCOMING APPT FOR FUTURE REFILLS. 01/13/21   Allred, Fayrene Fearing, MD  sodium chloride (MURO 128) 5 % ophthalmic ointment Place 1 application into both eyes at bedtime.    [provider]  SYRINGE DISPOSABLE 60CC 60 ML MISC 1 syringe per feeding three times a day 04/18/20   Esterwood, Amy S, PA-C  vitamin B-12 (CYANOCOBALAMIN) 100 MCG tablet Place 100 mcg into feeding tube daily.  12/26/19   [provider]  Omeprazole Magnesium (PRILOSEC) 10 MG PACK 20 mg by PEG Tube route daily before breakfast. 07/01/20 07/03/20  Napoleon Form, MD    Allergies    Cephalexin and Namenda  [memantine hcl]  Review of Systems   Review of Systems  Unable to perform ROS: Mental status change   Physical Exam Updated Vital Signs BP (!) 127/56   Pulse 81   Temp (!) 101.4 F (38.6 C) (Rectal)   Resp (!) 26   Ht 4\' 11"  (1.499 m)   Wt 73 kg   SpO2 100%   BMI 32.51 kg/m   Physical Exam Vitals and nursing note reviewed.  Constitutional:      Appearance: Normal appearance. She is ill-appearing.  HENT:     Head: Normocephalic.     Mouth/Throat:     Mouth: Mucous membranes are dry.  Eyes:     Pupils: Pupils are equal, round, and reactive to light.  Cardiovascular:     Rate and Rhythm: Normal rate.  Pulmonary:     Breath sounds: Wheezing and rales present.  Abdominal:     General: There is no distension.     Palpations: Abdomen is soft.  Musculoskeletal:     Cervical back: No rigidity.  Skin:    General: Skin is warm.     Coloration: Skin is pale.  Neurological:     Mental Status: She is alert. She is disoriented.    ED Results / Procedures / Treatments   Labs (all labs ordered are listed, but only abnormal results are displayed) Labs Reviewed  RESP PANEL BY RT-PCR (FLU A&B, COVID) ARPGX2  CULTURE, BLOOD (ROUTINE X 2)  CULTURE, BLOOD (ROUTINE X 2)  URINE CULTURE  LACTIC ACID, PLASMA  LACTIC ACID, PLASMA  COMPREHENSIVE METABOLIC PANEL  CBC WITH DIFFERENTIAL/PLATELET  PROTIME-INR  APTT  URINALYSIS, ROUTINE W REFLEX MICROSCOPIC  BRAIN NATRIURETIC PEPTIDE    EKG None  Radiology No results found.  Procedures .Critical Care Performed by: , DO Authorized by: Rozelle Logan, DO   Critical care provider statement:    Critical care time (minutes):  45   Critical care time was exclusive of:  Separately billable procedures and treating other patients   Critical care was necessary to treat or prevent imminent or life-threatening deterioration of the following conditions:  Sepsis and respiratory failure   Critical care was time spent  personally by me on the following activities:  Ordering and performing treatments and interventions, ordering and review of laboratory studies, ordering and review of radiographic studies, pulse oximetry, re-evaluation  of patient's condition, review of old charts, obtaining history from patient or surrogate, examination of patient, evaluation of patient's response to treatment and development of treatment plan with patient or surrogate   I assumed direction of critical care for this patient from another provider in my specialty: no     Care discussed with: admitting provider     Medications Ordered in ED Medications  sodium chloride 0.9 % bolus 1,000 mL (1,000 mLs Intravenous New Bag/Given 06-08-21 0943)  ipratropium-albuterol (DUONEB) 0.5-2.5 (3) MG/3ML nebulizer solution 3 mL (has no administration in time range)  methylPREDNISolone sodium succinate (SOLU-MEDROL) 125 mg/2 mL injection 125 mg (has no administration in time range)    ED Course  I have reviewed the triage vital signs and the nursing notes.  Pertinent labs & imaging results that were available during my care of the patient were reviewed by me and considered in my medical decision making (see chart for details).    MDM Rules/Calculators/A&P                           Level 27 caveat  65 year old female presents emergency department accompanied by her sister who is her primary caregiver for concern of change in mental status and worsening respiratory status.  Patient is reportedly being treated for UTI/pneumonia as an outpatient on oral Levaquin.  She typically wears BiPAP at night but has required BiPAP throughout the day due to tachypnea and appreciated respiratory distress.  Patient is tachypneic here, belly breathing, febrile.  Stable blood pressure.  She is nonverbal, does not localize or follow commands.  Blood work shows an acute hyponatremia from baseline, normal saline has been ordered.  Chest x-ray shows bilateral  pneumonia.  Lactic is normal.  Consulted with ED pharmacist in regards to appropriate initial antibiotic therapy due to allergies and current outpatient p.o. therapy.  Zosyn was recommended as a starting point.  Patient was weaned from nonrebreather and transition to high flow, currently tolerating well.  Planning to avoid BiPAP due to mental status/pneumonia on x-ray.  Flu and COVID swab was negative.  Will admit patient for treatment of sepsis.  Sister updated and agrees with admission plan.  Patients evaluation and results requires admission for further treatment and care. Patient agrees with admission plan, offers no new complaints and is stable/unchanged at time of admit.  Final Clinical Impression(s) / ED Diagnoses Final diagnoses:  None    Rx / DC Orders ED Discharge Orders     None        Rozelle Logan, DO 06-08-21 1308

## 2021-06-02 NOTE — ED Notes (Signed)
Spoke with RT to see if pts O2 can be decreased. RT recommenced decreasing FiO2 to 30%. Pts SpO2 dropped to 87%. FiO2 was increased to 60% and Pt is now 94% and tolerating well  at this time.

## 2021-06-02 NOTE — Progress Notes (Addendum)
Pharmacy Antibiotic Note  Kaitlyn Powell is a 65 y.o. female admitted on 05/19/2021 with pneumonia and UTI.  Pharmacy has been consulted for zosyn dosing.  Was started on levaquin for UTI and PNA as outpatient; however, presented to ED as nonverbal with reported intermittent fevers at home. WBC 2.5. PCT 1.5. LA 1.1, afebrile. Scr 0.9 (CrCl 50 mL/min). Has cephalexin allergy but has tolerated PCN and cephalosporins in past.  Plan: Zosyn 3.375g IV q8h (4 hour infusion). Monitor renal fx, clinical pic, cx results, and LOT  Height: 4\' 11"  (149.9 cm) Weight: 73 kg (160 lb 15 oz) IBW/kg (Calculated) : 43.2  Temp (24hrs), Avg:100.6 F (38.1 C), Min:99.8 F (37.7 C), Max:101.4 F (38.6 C)  Recent Labs  Lab 05/23/2021 0915  WBC 2.5*  CREATININE 0.96  LATICACIDVEN 1.1    Estimated Creatinine Clearance: 50.8 mL/min (by C-G formula based on SCr of 0.96 mg/dL).    Allergies  Allergen Reactions   Cephalexin Other (See Comments)    Seizures    Namenda [Memantine Hcl] Other (See Comments)    Made memory issues worse, sleepiness, increased confusion    Antimicrobials this admission: Zosyn 10/31 >>   Dose adjustments this admission: N/A  Microbiology results: 10/31 BCx: sent 10/31 UCx: sent  10/31 Resp PCR: neg   Thank you for allowing pharmacy to be a part of this patient's care.  11/31, PharmD, BCCCP Clinical Pharmacist  Phone: [phone] 05/24/2021 4:27 PM  Please check AMION for all [facility] Pharmacy phone numbers After 10:00 PM, call Main Pharmacy [phone]

## 2021-06-02 NOTE — ED Notes (Signed)
Called and asked lab to add on phenytoin and procalcitonin

## 2021-06-02 NOTE — ED Notes (Signed)
RT called for Hi-Flow

## 2021-06-03 ENCOUNTER — Inpatient Hospital Stay (HOSPITAL_COMMUNITY): Payer: Medicare Other

## 2021-06-03 DIAGNOSIS — G20 Parkinson's disease: Secondary | ICD-10-CM | POA: Diagnosis not present

## 2021-06-03 DIAGNOSIS — Z515 Encounter for palliative care: Secondary | ICD-10-CM

## 2021-06-03 DIAGNOSIS — J9601 Acute respiratory failure with hypoxia: Secondary | ICD-10-CM

## 2021-06-03 DIAGNOSIS — R1310 Dysphagia, unspecified: Secondary | ICD-10-CM

## 2021-06-03 DIAGNOSIS — A419 Sepsis, unspecified organism: Secondary | ICD-10-CM | POA: Diagnosis not present

## 2021-06-03 DIAGNOSIS — Z7189 Other specified counseling: Secondary | ICD-10-CM

## 2021-06-03 DIAGNOSIS — J189 Pneumonia, unspecified organism: Secondary | ICD-10-CM | POA: Diagnosis not present

## 2021-06-03 LAB — CBC
HCT: 29.2 % — ABNORMAL LOW (ref 36.0–46.0)
Hemoglobin: 9.9 g/dL — ABNORMAL LOW (ref 12.0–15.0)
MCH: 34.1 pg — ABNORMAL HIGH (ref 26.0–34.0)
MCHC: 33.9 g/dL (ref 30.0–36.0)
MCV: 100.7 fL — ABNORMAL HIGH (ref 80.0–100.0)
Platelets: 48 10*3/uL — ABNORMAL LOW (ref 150–400)
RBC: 2.9 MIL/uL — ABNORMAL LOW (ref 3.87–5.11)
RDW: 13.9 % (ref 11.5–15.5)
WBC: 2.4 10*3/uL — ABNORMAL LOW (ref 4.0–10.5)
nRBC: 0 % (ref 0.0–0.2)

## 2021-06-03 LAB — BASIC METABOLIC PANEL
Anion gap: 9 (ref 5–15)
BUN: 34 mg/dL — ABNORMAL HIGH (ref 8–23)
CO2: 21 mmol/L — ABNORMAL LOW (ref 22–32)
Calcium: 7.8 mg/dL — ABNORMAL LOW (ref 8.9–10.3)
Chloride: 96 mmol/L — ABNORMAL LOW (ref 98–111)
Creatinine, Ser: 0.91 mg/dL (ref 0.44–1.00)
GFR, Estimated: >60 mL/min (ref >60)
Glucose, Bld: 113 mg/dL — ABNORMAL HIGH (ref 70–99)
Potassium: 4.6 mmol/L (ref 3.5–5.1)
Sodium: 126 mmol/L — ABNORMAL LOW (ref 135–145)

## 2021-06-03 LAB — PROCALCITONIN: Procalcitonin: 0.98 ng/mL

## 2021-06-03 LAB — URINE CULTURE: Culture: NO GROWTH

## 2021-06-03 MED ORDER — LORAZEPAM 1 MG PO TABS: 1.0000 mg | ORAL_TABLET | ORAL | Status: DC | PRN

## 2021-06-03 MED ORDER — ONDANSETRON 4 MG PO TBDP: 4.0000 mg | ORAL_TABLET | Freq: Four times a day (QID) | ORAL | Status: DC | PRN

## 2021-06-03 MED ORDER — MORPHINE BOLUS VIA INFUSION
1.0000 mg | INTRAVENOUS | Status: DC | PRN
  Administered 2021-06-04 (×6): 2 mg via INTRAVENOUS
  Filled 2021-06-03: qty 2

## 2021-06-03 MED ORDER — POLYVINYL ALCOHOL 1.4 % OP SOLN
1.0000 [drp] | Freq: Four times a day (QID) | OPHTHALMIC | Status: DC | PRN
  Filled 2021-06-03: qty 15

## 2021-06-03 MED ORDER — ONDANSETRON HCL 4 MG/2ML IJ SOLN: 4.0000 mg | Freq: Four times a day (QID) | INTRAMUSCULAR | Status: DC | PRN

## 2021-06-03 MED ORDER — GLYCOPYRROLATE 0.2 MG/ML IJ SOLN: 0.2000 mg | INTRAMUSCULAR | Status: DC | PRN

## 2021-06-03 MED ORDER — PHENYTOIN 125 MG/5ML PO SUSP
125.0000 mg | Freq: Every morning | ORAL | Status: DC
  Filled 2021-06-03: qty 8

## 2021-06-03 MED ORDER — HALOPERIDOL LACTATE 2 MG/ML PO CONC
0.5000 mg | ORAL | Status: DC | PRN
  Filled 2021-06-03: qty 0.3

## 2021-06-03 MED ORDER — HALOPERIDOL LACTATE 5 MG/ML IJ SOLN: 0.5000 mg | INTRAMUSCULAR | Status: DC | PRN

## 2021-06-03 MED ORDER — LORAZEPAM 2 MG/ML IJ SOLN
1.0000 mg | INTRAMUSCULAR | Status: DC | PRN
  Filled 2021-06-03: qty 1

## 2021-06-03 MED ORDER — SODIUM CHLORIDE 0.9 % IV SOLN: INTRAVENOUS | Status: DC

## 2021-06-03 MED ORDER — PHENYTOIN 125 MG/5ML PO SUSP
125.0000 mg | Freq: Every morning | ORAL | Status: DC
  Administered 2021-06-03: 125 mg
  Filled 2021-06-03 (×2): qty 5

## 2021-06-03 MED ORDER — OSMOLITE 1.2 CAL PO LIQD
237.0000 mL | ORAL | Status: DC
  Filled 2021-06-03: qty 237

## 2021-06-03 MED ORDER — PHENYTOIN 125 MG/5ML PO SUSP
200.0000 mg | Freq: Every day | ORAL | Status: DC
  Filled 2021-06-03 (×3): qty 8

## 2021-06-03 MED ORDER — BIOTENE DRY MOUTH MT LIQD: 15.0000 mL | OROMUCOSAL | Status: DC | PRN

## 2021-06-03 MED ORDER — PHENYTOIN 125 MG/5ML PO SUSP: 200.0000 mg | Freq: Every day | ORAL | Status: DC

## 2021-06-03 MED ORDER — LORAZEPAM 2 MG/ML PO CONC: 1.0000 mg | ORAL | Status: DC | PRN

## 2021-06-03 MED ORDER — FUROSEMIDE 10 MG/ML IJ SOLN
40.0000 mg | Freq: Once | INTRAMUSCULAR | Status: DC
  Filled 2021-06-03: qty 4

## 2021-06-03 MED ORDER — OSMOLITE 1.2 CAL PO LIQD
1000.0000 mL | ORAL | Status: DC
  Filled 2021-06-03: qty 1000

## 2021-06-03 MED ORDER — GLYCOPYRROLATE 1 MG PO TABS
1.0000 mg | ORAL_TABLET | ORAL | Status: DC | PRN
  Filled 2021-06-03: qty 1

## 2021-06-03 MED ORDER — HALOPERIDOL 0.5 MG PO TABS
0.5000 mg | ORAL_TABLET | ORAL | Status: DC | PRN
  Filled 2021-06-03: qty 1

## 2021-06-03 MED ORDER — MORPHINE 100MG IN NS 100ML (1MG/ML) PREMIX INFUSION
1.0000 mg/h | INTRAVENOUS | Status: DC
Start: 2021-06-03 — End: 2021-06-05
  Administered 2021-06-03: 1 mg/h via INTRAVENOUS
  Administered 2021-06-04: 2 mg/h via INTRAVENOUS
  Administered 2021-06-04: 7 mg/h via INTRAVENOUS
  Filled 2021-06-03 (×2): qty 100

## 2021-06-03 NOTE — Progress Notes (Signed)
This chaplain responded to family's request for spiritual care.  The chaplain is appreciative of the RN-Sandy update before the visit.  The chaplain understands the Pt. was admitted with aspiration pneumonia.  The Pt. sister-[name] and brother in law-George are at the bedside, both are knowledgeable of the Pt. healthcare journey.  The chaplain understands the Pt. lives with [name] and [employer].   The chaplain offered sacred space for [name] to reflect on the Pt. story of community, appreciation of music, and family love while living with Down's Syndrome.  The family accepted the chaplain's invitation for prayer and requested a Catholic priest visit. The chaplain will update the RN as plans are determined for a priest visit.  Chaplain Stephanie Acre [phone]

## 2021-06-03 NOTE — Consult Note (Signed)
Consultation Note Date: 06/03/2021   Patient Name: Kaitlyn Powell  DOB: 1955/10/12  MRN: [id number]  Age / Sex: 65 y.o., female  PCP: Tracey Harries, MD Referring Physician: Zannie Cove, MD  Reason for Consultation: Establishing goals of care "urgent consult needed, Down Syndrome, dementia, and Parkinson's with respiratory failure and aspiration pneumonia"  HPI/Patient Profile: 66 y.o. female  with past medical history of Down Syndrome/MR, Parkinson's, OSA on nocturnal BiPAPm hypothyroidism, severe persistent asthma, PPM due to heart block, dysphasia with PEG tube, seizure disorder, and hx of DVT/PE. She presented to the emergency department on June 12, 2021 with shortness of breath and weakness. In the ED, she is febrile and tachypneic. Labs are significant for WBC 2.5, hemoglobin 10.7, platelets 50. Chest x-ray is suggestive of bilateral pneumonia with possible small left pleural effusion. Initially on 15L NRB, then progressed to high flow nasal cannula, then requiring BiPAP.  Admitted to [facility] with acute respiratory failure and suspected aspiration pneumonia.   Clinical Assessment and Goals of Care: I have reviewed medical records including EPIC notes, labs and imaging, and discussed the case with Dr. Jomarie Longs. I went to see patient in the ED. She is on BiPAP and somnolent. Her sister [name] and brother-in-law [name] are at bedside. [name] decides to stay with patient at the bedside, while [facility] and I go to the ED consult room to discuss diagnosis, prognosis, GOC, EOL wishes, disposition, and options.  Patient and family are known to our service from her previous hospitalization in March 2021. I re-introduced Palliative Medicine as specialized medical care for people living with serious illness. It focuses on providing relief from the symptoms and stress of a serious illness.   We discussed a brief life review of the  patient. Oral is from a large family; she is one of 6 children. They are all originally from [place], [place]. [name] lived in a group home there for many years. About 5 years ago, she was hospitalized with a PE and aspiration pneumonia. The medical team at that time had given her a grim prognosis, and the family was conflicted about goals of care. The 4 older brothers (who all live in the [place] area) were in favor of comfort care. However, [name] and [name] wanted to give her a chance to recover. They opted for PEG tube placement and ultimately brought [name] to live with them in [place]. She regained functional status with PT and eventually was able to have the PEG tube removed. However, she had to have another PEG tube placed in March 2021.   As far as functional status, [name] reports there has been a decline. [name] is mostly non-verbal at baseline with the exception of "yeah", and "ok". [name] reports she would have been bed-bound for the past year except that she and [name] "walk for her", basically fully supporting her and simulating the act of walking.    We discussed her current illness and what it means in the larger context of her ongoing co-morbidities.  Natural disease trajectory of Parkinson's disease was discussed.  We reviewed that Parkinson's is a progressive, non-curable disease underlying the patient's current acute medical conditions. We reviewed specific indicators of end stages, including inability to communicate, bed bound/non-ambulatory status, severe dysphasia, and incontinence of bowel/bladder.  Discussed that her Parkinson's has caused irreversible dysphagia. Focused on recurrent aspiration pneumonia. Explained that it is a terminal condition. [name] verbalizes understanding and agrees.   The difference between full scope medical intervention and comfort care was considered.  I reviewed the concept of comfort care, emphasizing this involves de-escalating and stopping full scope  medical interventions, allowing a natural course to occur. Discussed that the goal is comfort and dignity rather than cure/prolonging life.    Discussed transitioning to comfort care while in the hospital, and what that would look like--keeping her clean and dry, no labs, no artificial hydration or feeding, no antibiotics, minimizing of medications, medication for pain and dyspnea. This is a very difficult decision for [name] but she ultimately decides to proceed with transition to comfort care. Discussed starting a morphine infusion and then transitioning off BiPAP. Provided education on expectations at EOL and that prognosis may be hours to days.   17:45 - I returned to bedside. Morphine infusion has been started. Patient appears comfortable and her breathing is less labored. Discussed the need for visit from Catholic priest Father Ree Kida for last rites. He can visit tonight if urgently needed but has a standing appointment tomorrow morning at 9:30 am. I let [name] and [name] know that we can leave BiPAP in place until Father Ree Kida is able to visit.    Primary decision maker: [name] (sister)    SUMMARY OF RECOMMENDATIONS   Full comfort measures initiated DNR/DNI as previously documented Added orders for symptom management at EOL and discontinued orders that were not focused on comfort Awaiting visit from Catholic priest prior to transition off BiPAP PMT will continue to follow   When family is ready, may proceed with transition from BiPAP to high flow nasal cannula in coordination with respiratory therapy. Prior to removing BiPAP, please administer bolus dose of morphine via infusion (1-2 mg over 2 minutes).  Symptom Management:  Lorazepam (ATIVAN) prn for anxiety Haloperidol (HALDOL) prn for agitation  Glycopyrrolate (ROBINUL) for excessive secretions Ondansetron (ZOFRAN) prn for nausea Polyvinyl alcohol (LIQUIFILM TEARS) prn for dry eyes Antiseptic oral rinse (BIOTENE) prn for dry  mouth   Code Status/Advance Care Planning: DNR  Palliative Prophylaxis:  Frequent Pain Assessment and Turn Reposition  Additional Recommendations (Limitations, Scope, Preferences): Full Comfort Care  Prognosis:  Hours - Days  Discharge Planning: Anticipated Hospital Death      Primary Diagnoses: Present on Admission:  (Resolved) Localization-related (focal) (partial) symptomatic epilepsy and epileptic syndromes with complex partial seizures, intractable, with status epilepticus (HCC)  OSA treated with BiPAP  Anemia due to medication  Acquired hypothyroidism  History of pulmonary embolism  Pacemaker  Severe persistent asthma  (Resolved) Multifocal pneumonia  Hyponatremia  Thrombocytopenia (HCC)  Sepsis due to pneumonia (HCC)  Acute respiratory failure with hypoxia (HCC)   I have reviewed the medical record, interviewed the patient and family, and examined the patient. The following aspects are pertinent.  Past Medical History:  Diagnosis Date   Arthritis    Aspiration pneumonia (HCC)    Asthma    Cardiac arrhythmia    have pacemeaker   Down syndrome    DVT of popliteal vein (HCC) 09/08/2018   chronic   GERD (gastroesophageal reflux disease)    Heart block    Hypothyroidism  Mental retardation    Pacemaker    Parkinson's disease (HCC) 05/2019   Pulmonary emboli (HCC)    Seizures (HCC)    Sleep apnea with use of continuous positive airway pressure (CPAP)      Family History  Problem Relation Age of Onset   Diabetes Father    Heart disease Father    Breast cancer Sister    Scheduled Meds:  budesonide  0.5 mg Nebulization BID   cannabidiol  370 mg Oral BID   carbidopa-levodopa  2 tablet Per Tube TID   ipratropium-albuterol  3 mL Nebulization Q6H   levETIRAcetam  1,000 mg Per Tube Q2200   And   levETIRAcetam  500 mg Per Tube q morning   phenytoin  200 mg Per Tube Q2200   And   phenytoin  125 mg Per Tube q morning   sodium chloride  1 drop Both  Eyes QHS   Continuous Infusions:  morphine     PRN Meds:.acetaminophen **OR** acetaminophen, albuterol, antiseptic oral rinse, cyclobenzaprine, glycopyrrolate **OR** glycopyrrolate **OR** glycopyrrolate, haloperidol **OR** haloperidol **OR** haloperidol lactate, LORazepam **OR** LORazepam **OR** LORazepam, morphine, ondansetron **OR** ondansetron (ZOFRAN) IV, polyvinyl alcohol   Allergies  Allergen Reactions   Cephalexin Other (See Comments)    Seizures    Namenda [Memantine Hcl] Other (See Comments)    Made memory issues worse, sleepiness, increased confusion   Review of Systems  Unable to perform ROS  Physical Exam Vitals reviewed.  Constitutional:      Appearance: She is obese. She is ill-appearing.  Pulmonary:     Effort: Tachypnea present.     Comments: BiPAP Neurological:     Mental Status: She is lethargic.     Motor: Weakness present.  Psychiatric:        Speech: She is noncommunicative.    Vital Signs: BP (!) 112/55 (BP Location: Right Arm)   Pulse 77   Temp 98.1 F (36.7 C) (Oral)   Resp 20   Ht 4\' 11"  (1.499 m)   Wt 73 kg   SpO2 97%   BMI 32.51 kg/m  Pain Scale: Faces      SpO2: SpO2: 97 % O2 Device:SpO2: 97 %   Palliative Assessment/Data: PPS 10%     Time In: 1350 Time Out: 1520 Additional time: 15 minutes Time Total: 105 minutes Greater than 50%  of this time was spent counseling and coordinating care related to the above assessment and plan.  Signed by: , NP   Please contact Palliative Medicine Team phone at [phone] for questions and concerns.  For individual provider: See [phone]

## 2021-06-03 NOTE — Progress Notes (Signed)
RN of pt called this RT at 0740 stating that admitting MD requested pt be placed back on heated high flow nasal cannula. RT at bedside at 0745 removed pt from BiPAP and placed pt on HHFNC. While on BiPAP pt presented with WOB and accessory muscle use. Once placed on HHFNC, pt presented the same. RT spoke with family about placing pt on [facility], and family agreed. RT came back to room to give pt scheduled breathing tx. Family of pt was adamantly requesting pt be place back on BiPAP due to her WOB. RT contacted Dr. Jomarie Longs about the situation. Admitting stated she came and spoke with family this AM for 30 mins and informed them of pt's condition as to not leaving pt on BiPAP. RT went to talk with family once again and family insisted pt be placed back on BiPAP and also requested to speak to ED MD. RT contacted admitting. Admitting stated to go ahead and place pt back on BiPAP. RN of pt is at bedside as well. RT will continue to monitor pt.

## 2021-06-03 NOTE — ED Notes (Signed)
Cleaned for small amount greenish diarrhea stool. Skin reddened perineum and buttock with open abrasions noted  buttock bilaterally. Skin barrier placed over all redness and abrasions. Peri care done and new purwick placed.

## 2021-06-03 NOTE — ED Notes (Signed)
Nephrology at bedside

## 2021-06-03 NOTE — ED Notes (Addendum)
RT at bedside, patient placed back on BIPAP. Family remains at bedside.

## 2021-06-03 NOTE — ED Notes (Signed)
Pts respiratory rate has increased and her mentation has decreased. She is no longer able to follow basic commands. Dr. Julian Reil called to bedside for reassessment. Plan is to trial bipap due to DNI status.

## 2021-06-03 NOTE — Progress Notes (Signed)
RT assisted with transportation of this pt from ED to 5W8 while on BiPAP. Pt tolerated well with SVS and no complaints. RT gave report to 5W RT. RN currently at bedside.

## 2021-06-03 NOTE — ED Notes (Signed)
Chaplain requested by family and notified of request.

## 2021-06-03 NOTE — Progress Notes (Signed)
RT removed pt from BiPAP and placed on heated high flow nasal cannula (HHFNC) per MD request.

## 2021-06-03 NOTE — Progress Notes (Signed)
Placed pt. On bipap due to distress per MD.

## 2021-06-03 NOTE — ED Notes (Signed)
Attempted to draw morning labs - unsuccessful. This RN asked the phlebotomist to try.

## 2021-06-03 NOTE — ED Notes (Signed)
Chaplain is at bedside with family and patient.

## 2021-06-03 NOTE — Progress Notes (Signed)
Nephrology Follow-Up Consult note   Assessment/Recommendations: Kaitlyn Powell is a/an 65 y.o. female with a past medical history significant for down syndrome, Parkinson's, OSA, dementia, hypothyroidism, asthma, G-tube dependence, DVT and PE   Hyponatremia: chronic for the past 6 months or so. It's unclear to me what the cause is but I would suspect SIADH from recurrent/ongoing pulmonary disease.  Appears dry on exam and has improved with IV hydration -Continue IV fluids with normal saline -Goal Na is 131 x 9 a.m. tomorrow -Continue LR for now at 150cc/hr and adjust therapy based on response -Urine sodium 15 and urine osmolality 418 -TSH only mildly decreased at 0.3   Volume Status: Appears dry. Hydration as above   Bilateral pneumonia: Antibiotics and management per primary team   Acute hypoxic respiratory failure: Likely secondary to pneumonia.  Management per primary team with supplemental oxygen and antibiotics.  Nebulizers and steroids   Parkinson's/Down syndrome: Management per primary   GOC: Overall decline with medical comorbidities.  She is worsened overnight likely related to sepsis and ongoing aspiration.  Monitoring clinical status closely.  Possibly transition to comfort measures later today   Recommendations conveyed to primary service.    Darnell Level [facility] 06/03/2021 12:29 PM  ___________________________________________________________  CC: Hyponatremia, altered mental status, sepsis  Interval History/Subjective: Worsening clinical status overnight with worsening hypoxia and confusion.  Signs of ongoing aspiration requiring BiPAP.  Difficult lab draw.  Medications:  Current Facility-Administered Medications  Medication Dose Route Frequency Provider Last Rate Last Admin   0.9 %  sodium chloride infusion   Intravenous Continuous Zannie Cove, MD 75 mL/hr at 06/03/21 0832 New Bag at 06/03/21 6712   acetaminophen (TYLENOL) tablet 650 mg   650 mg Per Tube Q6H PRN Orland Mustard, MD   650 mg at 06/03/21 1139   Or   acetaminophen (TYLENOL) suppository 650 mg  650 mg Rectal Q6H PRN Orland Mustard, MD       albuterol (PROVENTIL) (2.5 MG/3ML) 0.083% nebulizer solution 2.5 mg  2.5 mg Nebulization Q4H PRN Orland Mustard, MD       budesonide (PULMICORT) nebulizer solution 0.5 mg  0.5 mg Nebulization BID Orland Mustard, MD       cannabidiol (EPIDIOLEX) 100 MG/ML solution 370 mg  370 mg Oral BID Lodema Hong A, RPH   370 mg at 05/10/2021 2107   carbidopa-levodopa (SINEMET IR) 25-100 MG per tablet immediate release 2 tablet  2 tablet Per Tube TID Orland Mustard, MD   2 tablet at 06/03/21 1108   cyclobenzaprine (FLEXERIL) tablet 2.5 mg  2.5 mg Per Tube QHS PRN Orland Mustard, MD       feeding supplement (OSMOLITE 1.2 CAL) liquid 237 mL  237 mL Per Tube Q24H Zannie Cove, MD       folic acid (FOLVITE) tablet 1 mg  1 mg Per Tube Daily Orland Mustard, MD   1 mg at 06/03/21 1107   ipratropium-albuterol (DUONEB) 0.5-2.5 (3) MG/3ML nebulizer solution 3 mL  3 mL Nebulization Q6H Orland Mustard, MD   3 mL at 06/03/21 0745   levETIRAcetam (KEPPRA) 100 MG/ML solution 1,000 mg  1,000 mg Per Tube Q2200 Vicente Serene, RPH   1,000 mg at 05/14/2021 2221   And   levETIRAcetam (KEPPRA) 100 MG/ML solution 500 mg  500 mg Per Tube q morning Vicente Serene, RPH   500 mg at 06/03/21 1118   levothyroxine (SYNTHROID) tablet 175 mcg  175 mcg Per Tube QAC breakfast Orland Mustard, MD  phenytoin (DILANTIN) 125 MG/5ML suspension 200 mg  200 mg Per Tube Q2200 Zannie Cove, MD       And   phenytoin (DILANTIN) 125 MG/5ML suspension 125 mg  125 mg Per Tube q morning Zannie Cove, MD       piperacillin-tazobactam (ZOSYN) IVPB 3.375 g  3.375 g Intravenous Q8H Orland Mustard, MD   Stopped at 06/03/21 6301   senna (SENOKOT) tablet 17.2 mg  2 tablet Oral Daily PRN Orland Mustard, MD       simvastatin (ZOCOR) tablet 40 mg  40 mg Per Tube QHS Orland Mustard, MD   40 mg  at 05/05/2021 2220   sodium chloride (MURO 128) 5 % ophthalmic solution 1 drop  1 drop Both Eyes QHS Orland Mustard, MD       vitamin B-12 (CYANOCOBALAMIN) tablet 100 mcg  100 mcg Per Tube Daily Orland Mustard, MD   100 mcg at 06/03/21 1106   Current Outpatient Medications  Medication Sig Dispense Refill   budesonide (PULMICORT) 0.5 MG/2ML nebulizer solution INHALE 1 VIAL BY NEBULIZATION IN THE MORNING AND AT BEDTIME. 120 mL 11   cannabidiol (EPIDIOLEX) 100 MG/ML solution GIVE 3.7 ML BY MOUTH 2 TIMES A DAY. 222 mL 5   carbidopa-levodopa (SINEMET IR) 25-100 MG tablet PLACE 2 TABLETS INTO FEEDING TUBE 3 (THREE) TIMES DAILY. TAKE 2 TABLET THREE TIMES A DAY WITH MEALS 216 tablet 0   cyclobenzaprine (FLEXERIL) 5 MG tablet Take 1/2 to 1 tablet at night as needed for muscle spasms 30 tablet 4   Dextran 70-Hypromellose (ARTIFICIAL TEARS) 0.1-0.3 % SOLN Apply 1 drop to eye 2 (two) times daily as needed (dry eyes).     diclofenac Sodium (VOLTAREN) 1 % GEL Apply 2 g topically 4 (four) times daily. To affected joint. 350 g 6   folic acid (FOLVITE) 1 MG tablet Take 1 mg by mouth daily.     furosemide (LASIX) 20 MG tablet TAKE 1 TABLET BY MOUTH DAILY FOR 3 DAYS. (Patient taking differently: Take 20 mg by mouth daily as needed for fluid.) 30 tablet 0   GUAIFENESIN PO Place 400 mg into feeding tube in the morning, at noon, and at bedtime.     ipratropium-albuterol (DUONEB) 0.5-2.5 (3) MG/3ML SOLN INHALE 1 VIAL VIA NEBULIZATION EVERY 6 HOURS AS NEEDED (Patient taking differently: Take 3 mLs by nebulization every 6 (six) hours as needed (sob/wheezing).) 360 mL 12   levETIRAcetam (KEPPRA) 100 MG/ML solution GIVE (500MG ) IN AM, (1000MG ) IN PM 473 mL 0   levofloxacin (LEVAQUIN) 25 MG/ML solution Take 20 mLs (500 mg total) by mouth daily for 7 doses. 140 mL 0   levothyroxine (SYNTHROID) 175 MCG tablet Place 175 mcg into feeding tube daily before breakfast.     Nutritional Supplements (FEEDING SUPPLEMENT,  OSMOLITE 1.2 CAL,) LIQD 1 can of Osmolite 1.2 bolus feeding daily Disp 30 day supply refill 1 year 237 mL 11   phenytoin (DILANTIN) 125 MG/5ML suspension Take 71mL  in AM, 55mL (200mg ) in PM 1080 mL 3   rivaroxaban (XARELTO) 20 MG TABS tablet Take 1 tablet (20 mg total) by mouth daily with supper. 30 tablet 5   Sennosides (SENNA) 8.8 MG/5ML SYRP TAKE 10 MLS BY MOUTH DAILY AS NEEDED (Patient taking differently: Take 10 mLs by mouth daily as needed (constipation).) 237 mL 1   simvastatin (ZOCOR) 40 MG tablet TAKE 1 TABLET (40 MG TOTAL) BY MOUTH AT BEDTIME. PLEASE KEEP UPCOMING APPT FOR FUTURE REFILLS. 90 tablet 3  sodium chloride (MURO 128) 5 % ophthalmic ointment Place 1 application into both eyes at bedtime.     vitamin B-12 (CYANOCOBALAMIN) 100 MCG tablet Place 100 mcg into feeding tube daily.      Incontinence Supplies (EXTENSION TUBING/CONNECTOR) MISC Use with PEG feedings change every 5 days Mic-key extension 6 each 11   NONFORMULARY OR COMPOUNDED ITEM Antifungal solution: Terbinafine 3%, Fluconazole 2%, Tea Tree Oil 5%, Urea 10%, Ibuprofen 2% in DMSO suspension #8mL (Patient not taking: No sig reported) 1 each 3   OXYGEN Inhale 3 L into the lungs as directed. With CPAP     scopolamine (TRANSDERM SCOP, 1.5 MG,) 1 MG/3DAYS Place 1 patch (1.5 mg total) onto the skin every 3 (three) days. 24 patch 2   SYRINGE DISPOSABLE 60CC 60 ML MISC 1 syringe per feeding three times a day 90 each 12      Review of Systems: 10 systems reviewed and negative except per interval history/subjective  Physical Exam: Vitals:   06/03/21 1157 06/03/21 1200  BP:  (!) 123/56  Pulse: 93 92  Resp: (!) 28 (!) 27  Temp:    SpO2: 96% 99%   No intake/output data recorded.  Intake/Output Summary (Last 24 hours) at 06/03/2021 1229 Last data filed at 06/03/2021 0155 Gross per 24 hour  Intake 577.41 ml  Output 400 ml  Net 177.41 ml   Constitutional: Acutely ill-appearing ENMT: Dry mucous membranes without lesions,  BiPAP in place CV: normal rate, some nonpitting edema at the ankle Respiratory: Coarse bilateral breath sounds with increased work of breathing Gastrointestinal: soft, non-tender, no palpable masses or hernias Skin: no visible lesions or rashes Psych: Awake and alert, noncommunicative   Test Results I personally reviewed new and old clinical labs and radiology tests Lab Results  Component Value Date   NA 126 (L) 06/03/2021   K 4.6 06/03/2021   CL 96 (L) 06/03/2021   CO2 21 (L) 06/03/2021   BUN 34 (H) 06/03/2021   CREATININE 0.91 06/03/2021   GFR 91.68 05/15/2021   CALCIUM 7.8 (L) 06/03/2021   ALBUMIN 2.2 (L) 06-30-21   PHOS 5.3 (H) 10/10/2019

## 2021-06-03 NOTE — Significant Event (Addendum)
Called to bedside by RN, pt with worsening mentation, respiratory distress, wet breath sounds, accessory muscle use.  Confirmed that pt is DNI, but is partial code and may use BIPAP if indicated.  Pt placed on rescue BIPAP.  Breathing has significantly improved on rescue BIPAP; however, mentation has not.  Informed by RN they were unable to draw AM labs (poor veins).  Family called and has arrived at bedside.  I fear that pt may be aspirating on BIPAP, and is certainly a large aspiration risk (discussed with family).  I fear that pt may be in DIC at this point with 50 platelets yesterday, and now has bleeding gums and bruising to R upper chest.  Discussed all of above with family.  CXR demonstrates worsening severe B PNA.  Family currently at bedside discussing and deciding if they want [place] to further attempt to draw labs, ABG, etc, vs more of a focus on comfort measures at this point.

## 2021-06-03 NOTE — ED Notes (Signed)
Resp at bedside placing pt on bipap

## 2021-06-03 NOTE — Progress Notes (Signed)
Pt arrived to 5W08 from ED. Pt on Bipap with no obvious signs of distress. Family at bedside. All questions answered.

## 2021-06-03 NOTE — ED Notes (Signed)
Materials management called and will supply/send tube feed pump and tubing to start tube feeds.

## 2021-06-03 NOTE — Progress Notes (Addendum)
PROGRESS NOTE    Kaitlyn Powell  ONG:[id number] DOB: 12-Jan-1956 DOA: 05/22/2021 PCP: Tracey Harries, MD  Brief Narrative: 65/F with history of Down syndrome, mental retardation, Parkinson's disease, dementia, asthma, dysphagia, PEG tube, OSA on BiPAP, history of DVT/PE was brought to the emergency room yesterday by sister with worsening respiratory distress.  She was started on Levaquin 10/27 prior to admission and was subsequently brought to the emergency room yesterday. -She is bedbound, nonverbal at baseline, despite PEG tube had been tolerating food by mouth and was primarily only getting liquids through PEG tube. -In the emergency room she was febrile tachypneic and hypoxic placed on a nonrebreather mask initially subsequently weaned down to nasal cannula.  Chest x-ray noted bilateral pneumonia, also noted to have severe hyponatremia on admission. -Since admission overnight early this morning, she had worsening respiratory distress, limited code, DNI, subsequently placed on BiPAP, chest x-ray noted worsening pneumonia. -She was also very difficult stick for blood draws and a.m. labs could not be obtained, despite numerous attempts  Assessment & Plan:   Acute hypoxic respiratory failure Aspiration pneumonia Toxic encephalopathy Sepsis, poa -Remains critically ill at this time, she is DNI -Currently on BiPAP with 60% FiO2,, remains obtunded, tachypneic,  recommended heated high flow to patient's sister in the setting of current diagnosis of aspiration pneumonia -Repeat chest x-ray this morning with worsening pneumonia -Discussed extensively with family, poor prognosis, she is at high risk for decompensation and continued worsening, confirmed limited code, no plan for CPR, defibrillation or mechanical intubation -Family was agreeable to transition to comfort care should she continue to decline/does not respond to medical treatment -Palliative consulted  Hyponatremia -Likely hypovolemic,  was improving with hydration -Sodium stabilized around 125, repeat BMP pending, and gentle NS at 75 mL/h, follow-up a.m. labs -nephrology following  Dysphagia -Restart tube feeds, has a PEG tube -Will continue to aspirate her secretions even if she gets all meds and p.o. intake via G-tube  Down syndrome, mental retardation, nonverbal Dementia Parkinson's disease Dysphagia -Total care, needs palliative services and ideally home hospice  Thrombocytopenia -Could be secondary to sepsis, has depression of all cell lines, will check peripheral smear -also check DIC panel  Pancytopenia -Check peripheral smear  History of seizure disorder -Continue Keppra and Dilantin  History of pulmonary embolism -Xarelto on hold in the setting of severe thrombocytopenia  DVT prophylaxis: SCDs Code Status: Limited code, no CPR, defibrillation or intubation Family Communication: Discussed with sister and brother-in-law at bedside Disposition Plan:  Status is: Inpatient  Remains inpatient appropriate because: Severity of illness  Consultants:  Palliative care  Procedures:   Antimicrobials:    Subjective: -Worsening respiratory distress placed on BiPAP at 6:30 AM  Objective: Vitals:   06/03/21 0800 06/03/21 0836 06/03/21 0905 06/03/21 1000  BP: (!) 124/48  107/60 (!) 128/58  Pulse: 84  87 89  Resp: (!) 32 (!) 29 (!) 24 (!) 28  Temp: 98.5 F (36.9 C)     TempSrc: Oral     SpO2: 99% 100% 96% 97%  Weight:      Height:        Intake/Output Summary (Last 24 hours) at 06/03/2021 1143 Last data filed at 06/03/2021 0155 Gross per 24 hour  Intake 1577.41 ml  Output 400 ml  Net 1177.41 ml   Filed Weights   05/27/2021 0926  Weight: 73 kg    Examination:  General exam: Chronically ill female laying in bed, BiPAP on, unresponsive HEENT: Bruising noted around right collarbone Respiratory system:  Scattered rhonchi throughout Cardiovascular system: S1-S2, regular rhythm,  tachycardic Abd: nondistended, soft and nontender., PEG tube noted Central nervous system: Not responsive, does not follow commands, nonverbal at baseline Extremities: No edema Skin: Bruising as noted above Psychiatry: Nonverbal and obtunded    Data Reviewed:   CBC: Recent Labs  Lab 06/19/2021 0915  WBC 2.5*  NEUTROABS 2.0  HGB 10.7*  HCT 30.7*  MCV 99.0  PLT 50*   Basic Metabolic Panel: Recent Labs  Lab Jun 19, 2021 0915 06/19/21 1417 06-19-21 1832 2021/06/19 2230  NA 119*  118* 125* 125* 124*  K 5.0  --   --   --   CL 85*  --   --   --   CO2 24  --   --   --   GLUCOSE 106*  --   --   --   BUN 38*  --   --   --   CREATININE 0.96  --   --   --   CALCIUM 7.3*  --   --   --    GFR: Estimated Creatinine Clearance: 50.8 mL/min (by C-G formula based on SCr of 0.96 mg/dL). Liver Function Tests: Recent Labs  Lab 19-Jun-2021 0915  AST 86*  ALT 8  ALKPHOS 65  BILITOT 0.5  PROT 6.5  ALBUMIN 2.2*   No results for input(s): LIPASE, AMYLASE in the last 168 hours. No results for input(s): AMMONIA in the last 168 hours. Coagulation Profile: Recent Labs  Lab 06-19-2021 0915  INR 1.4*   Cardiac Enzymes: No results for input(s): CKTOTAL, CKMB, CKMBINDEX, TROPONINI in the last 168 hours. BNP (last 3 results) No results for input(s): PROBNP in the last 8760 hours. HbA1C: No results for input(s): HGBA1C in the last 72 hours. CBG: No results for input(s): GLUCAP in the last 168 hours. Lipid Profile: No results for input(s): CHOL, HDL, LDLCALC, TRIG, CHOLHDL, LDLDIRECT in the last 72 hours. Thyroid Function Tests: Recent Labs    2021/06/19 0915  TSH 0.328*   Anemia Panel: No results for input(s): VITAMINB12, FOLATE, FERRITIN, TIBC, IRON, RETICCTPCT in the last 72 hours. Urine analysis:    Component Value Date/Time   COLORURINE YELLOW 06/19/21 1200   APPEARANCEUR HAZY (A) 2021-06-19 1200   APPEARANCEUR Turbid (A) 06/21/2018 1014   LABSPEC 1.013 06/19/21 1200    PHURINE 5.0 19-Jun-2021 1200   GLUCOSEU NEGATIVE 19-Jun-2021 1200   GLUCOSEU NEGATIVE 05/08/2020 0926   HGBUR SMALL (A) 2021/06/19 1200   BILIRUBINUR NEGATIVE 19-Jun-2021 1200   BILIRUBINUR Negative 06/21/2018 1014   KETONESUR 5 (A) 06/19/21 1200   PROTEINUR 30 (A) 2021-06-19 1200   UROBILINOGEN 0.2 05/08/2020 0926   NITRITE NEGATIVE 06-19-2021 1200   LEUKOCYTESUR TRACE (A) 06/19/21 1200   Sepsis Labs: @LABRCNTIP (procalcitonin:4,lacticidven:4)  ) Recent Results (from the past 240 hour(s))  Blood Culture (routine x 2)     Status: None (Preliminary result)   Collection Time: 06-19-21  9:14 AM   Specimen: BLOOD RIGHT HAND  Result Value Ref Range Status   Specimen Description BLOOD RIGHT HAND  Final   Special Requests   Final    BOTTLES DRAWN AEROBIC AND ANAEROBIC Blood Culture adequate volume   Culture   Final    NO GROWTH < 24 HOURS Performed at [facility], [address], [place], [place] [address]    Report Status PENDING  Incomplete  Blood Culture (routine x 2)     Status: None (Preliminary result)   Collection Time: 06/19/2021  9:14  AM   Specimen: BLOOD LEFT HAND  Result Value Ref Range Status   Specimen Description BLOOD LEFT HAND  Final   Special Requests   Final    BOTTLES DRAWN AEROBIC AND ANAEROBIC Blood Culture adequate volume   Culture   Final    NO GROWTH < 24 HOURS Performed at [facility], [address] [place], [place] [address]    Report Status PENDING  Incomplete  Resp Panel by RT-PCR (Flu A&B, Covid) Nasopharyngeal Swab     Status: None   Collection Time: 05/06/2021  9:32 AM   Specimen: Nasopharyngeal Swab; Nasopharyngeal(NP) swabs in vial transport medium  Result Value Ref Range Status   SARS Coronavirus 2 by RT PCR NEGATIVE NEGATIVE Final    Comment: (NOTE) SARS-CoV-2 target nucleic acids are NOT DETECTED.  The SARS-CoV-2 RNA is generally detectable in upper respiratory specimens during the acute phase of infection. The  lowest concentration of SARS-CoV-2 viral copies this assay can detect is 138 copies/mL. A negative result does not preclude SARS-Cov-2 infection and should not be used as the sole basis for treatment or other patient management decisions. A negative result may occur with  improper specimen collection/handling, submission of specimen other than nasopharyngeal swab, presence of viral mutation(s) within the areas targeted by this assay, and inadequate number of viral copies(<138 copies/mL). A negative result must be combined with clinical observations, patient history, and epidemiological information. The expected result is Negative.  Fact Sheet for Patients:  BloggerCourse.com  Fact Sheet for Healthcare Providers:  SeriousBroker.it  This test is no t yet approved or cleared by the [place] FDA and  has been authorized for detection and/or diagnosis of SARS-CoV-2 by FDA under an Emergency Use Authorization (EUA). This EUA will remain  in effect (meaning this test can be used) for the duration of the COVID-19 declaration under Section 564(b)(1) of the Act, 21 U.S.C.section 360bbb-3(b)(1), unless the authorization is terminated  or revoked sooner.       Influenza A by PCR NEGATIVE NEGATIVE Final   Influenza B by PCR NEGATIVE NEGATIVE Final    Comment: (NOTE) The Xpert Xpress SARS-CoV-2/FLU/RSV plus assay is intended as an aid in the diagnosis of influenza from Nasopharyngeal swab specimens and should not be used as a sole basis for treatment. Nasal washings and aspirates are unacceptable for Xpert Xpress SARS-CoV-2/FLU/RSV testing.  Fact Sheet for Patients: BloggerCourse.com  Fact Sheet for Healthcare Providers: SeriousBroker.it  This test is not yet approved or cleared by the [place] FDA and has been authorized for detection and/or diagnosis of SARS-CoV-2 by FDA under  an Emergency Use Authorization (EUA). This EUA will remain in effect (meaning this test can be used) for the duration of the COVID-19 declaration under Section 564(b)(1) of the Act, 21 U.S.C. section 360bbb-3(b)(1), unless the authorization is terminated or revoked.  Performed at [facility], [address], [place], [place] [address]   Respiratory (~20 pathogens) panel by PCR     Status: None   Collection Time: 06/01/2021 12:44 PM   Specimen: Nasopharyngeal Swab; Respiratory  Result Value Ref Range Status   Adenovirus NOT DETECTED NOT DETECTED Final   Coronavirus 229E NOT DETECTED NOT DETECTED Final    Comment: (NOTE) The Coronavirus on the Respiratory Panel, DOES NOT test for the novel  Coronavirus (2019 nCoV)    Coronavirus HKU1 NOT DETECTED NOT DETECTED Final   Coronavirus NL63 NOT DETECTED NOT DETECTED Final   Coronavirus OC43 NOT DETECTED NOT DETECTED  Final   Metapneumovirus NOT DETECTED NOT DETECTED Final   Rhinovirus / Enterovirus NOT DETECTED NOT DETECTED Final   Influenza A NOT DETECTED NOT DETECTED Final   Influenza B NOT DETECTED NOT DETECTED Final   Parainfluenza Virus 1 NOT DETECTED NOT DETECTED Final   Parainfluenza Virus 2 NOT DETECTED NOT DETECTED Final   Parainfluenza Virus 3 NOT DETECTED NOT DETECTED Final   Parainfluenza Virus 4 NOT DETECTED NOT DETECTED Final   Respiratory Syncytial Virus NOT DETECTED NOT DETECTED Final   Bordetella pertussis NOT DETECTED NOT DETECTED Final   Bordetella Parapertussis NOT DETECTED NOT DETECTED Final   Chlamydophila pneumoniae NOT DETECTED NOT DETECTED Final   Mycoplasma pneumoniae NOT DETECTED NOT DETECTED Final    Comment: Performed at [facility], [address] [place], [place] [address]  Urine Culture     Status: None   Collection Time: 05/27/2021 12:45 PM   Specimen: In/Out Cath Urine  Result Value Ref Range Status   Specimen Description IN/OUT CATH URINE  Final   Special Requests NONE  Final   Culture    Final    NO GROWTH Performed at [facility], [address], [place], [place] [address]    Report Status 06/03/2021 FINAL  Final         Radiology Studies: CT HEAD WO CONTRAST ( )  Result Date: 05/31/2021 CLINICAL DATA:  Seizure, nontraumatic. Worsening mental status. Difficulty breathing. Down syndrome. Parkinson's disease. EXAM: CT HEAD WITHOUT CONTRAST TECHNIQUE: Contiguous axial images were obtained from the base of the skull through the vertex without intravenous contrast. COMPARISON:  04/05/2019 FINDINGS: Brain: Chronic brain atrophy with temporal lobe predominance. Chronic small-vessel ischemic changes throughout the cerebral hemispheric white matter. No sign of acute infarction, mass lesion, structure hydrocephalus or extra-axial collection. Vascular: There is atherosclerotic calcification of the major vessels at the base of the brain. Skull: Negative Sinuses/Orbits: Small amount of fluid in the right maxillary sinus. Other sinuses are clear. Orbits negative. Other: None IMPRESSION: Advanced chronic brain atrophy, temporal lobe predominant. Chronic small-vessel ischemic changes throughout the white matter. No acute or reversible finding. Cerebral Atrophy (ICD10-G31.9). Air-fluid level in the right maxillary sinus suggesting rhinosinusitis. Electronically Signed   By: Paulina Fusi M.D.   On: 05/28/2021 14:06   DG CHEST PORT 1 VIEW  Result Date: 06/03/2021 CLINICAL DATA:  65 year old female with history of respiratory distress. EXAM: PORTABLE CHEST 1 VIEW COMPARISON:  Chest x-ray 05/31/2021. FINDINGS: Right-sided pacemaker device in place with lead tips projecting over the expected location of the right atrium and right ventricle. Lung volumes are normal. Patchy multifocal interstitial and airspace disease noted throughout the mid to lower lungs bilaterally, worsened slightly when compared to the prior study. No definite pleural effusions. No pneumothorax. Cephalization of the  pulmonary vasculature. Heart size is normal. Upper mediastinal contours are within normal limits. Atherosclerotic calcifications in the thoracic aorta. IMPRESSION: 1. The appearance the chest is compatible with severe multilobar bilateral pneumonia, slightly worsened compared to the prior study, as above. 2. Aortic atherosclerosis. Electronically Signed   By: Trudie Reed M.D.   On: 06/03/2021 06:29   DG Chest Port 1 View  Result Date: 05/14/2021 CLINICAL DATA:  Altered mental status EXAM: PORTABLE CHEST 1 VIEW COMPARISON:  Previous studies including the examination of 05/15/2021 FINDINGS: Transverse diameter of heart is in the upper limits of normal. Thoracic aorta is tortuous and ectatic. Patchy infiltrates are seen in the parahilar regions and lower lung fields, more so on  the left side with interval worsening. Both hilar regions appear more prominent. Left lateral CP angle is indistinct. There is no pneumothorax. Deformities are seen in the left clavicle and left upper ribs with no significant change. Pacemaker battery is seen in right infraclavicular region. IMPRESSION: There is interval increase in patchy infiltrates in the parahilar regions and lower lung fields, more so on the left side suggesting bilateral pneumonia. Both hilar regions and right paratracheal soft tissues appear more prominent. Possibility of lymphadenopathy is not excluded. Possible small left pleural effusion. Electronically Signed   By: Ernie Avena M.D.   On: 06/10/2021 10:11        Scheduled Meds:  budesonide  0.5 mg Nebulization BID   cannabidiol  370 mg Oral BID   carbidopa-levodopa  2 tablet Per Tube TID   feeding supplement (OSMOLITE 1.2 CAL)  237 mL Per Tube Q24H   folic acid  1 mg Per Tube Daily   guaiFENesin  400 mg Per Tube Daily   ipratropium-albuterol  3 mL Nebulization Q6H   levETIRAcetam  1,000 mg Per Tube Q2200   And   levETIRAcetam  500 mg Per Tube q morning   levothyroxine  175 mcg Per  Tube QAC breakfast   phenytoin  200 mg Per Tube Q2200   And   phenytoin  125 mg Per Tube q morning   simvastatin  40 mg Per Tube QHS   sodium chloride  1 drop Both Eyes QHS   vitamin B-12  100 mcg Per Tube Daily   Continuous Infusions:  sodium chloride 75 mL/hr at 06/03/21 0832   piperacillin-tazobactam (ZOSYN)  IV Stopped (06/03/21 9311)     LOS: 1 day    Time spent:  Zannie Cove, MD [facility]   06/03/2021, 11:43 AM

## 2021-06-03 NOTE — Progress Notes (Signed)
This chaplain updated the Pt. family on the chaplain's communication with Father Ree Kida.  The chaplain understands because of the observance of All Saints Day, Father Ree Kida has limited availability to visit today. Father Ree Kida chose to share his cell phone number with the Pt. sister if there is an imminent need tonight after 8pm.  Father Ree Kida set aside Wed. from 9:30-10:00 am to visit if he does not hear from the family tonight.    This chaplain will F/U with spiritual care as needed.  Chaplain Stephanie Acre [phone]

## 2021-06-03 NOTE — ED Notes (Signed)
Family at bedside. 

## 2021-06-03 NOTE — ED Notes (Signed)
Morning labs attempted by two phlebotomists but was unsuccessful - Dr. Julian Reil informed.

## 2021-06-03 DEATH — deceased

## 2021-06-04 DIAGNOSIS — Q909 Down syndrome, unspecified: Secondary | ICD-10-CM | POA: Diagnosis not present

## 2021-06-04 DIAGNOSIS — A419 Sepsis, unspecified organism: Secondary | ICD-10-CM | POA: Diagnosis not present

## 2021-06-04 DIAGNOSIS — J9621 Acute and chronic respiratory failure with hypoxia: Secondary | ICD-10-CM | POA: Diagnosis not present

## 2021-06-04 DIAGNOSIS — Z515 Encounter for palliative care: Secondary | ICD-10-CM | POA: Diagnosis not present

## 2021-06-04 DIAGNOSIS — J189 Pneumonia, unspecified organism: Secondary | ICD-10-CM | POA: Diagnosis not present

## 2021-06-04 LAB — LEGIONELLA PNEUMOPHILA SEROGP 1 UR AG: L. pneumophila Serogp 1 Ur Ag: NEGATIVE

## 2021-06-04 MED ORDER — LORAZEPAM 2 MG/ML IJ SOLN: 2.0000 mg | INTRAMUSCULAR | Status: DC | PRN

## 2021-06-05 ENCOUNTER — Ambulatory Visit (HOSPITAL_COMMUNITY): Payer: Medicare Other

## 2021-06-06 ENCOUNTER — Ambulatory Visit: Payer: Medicare Other | Admitting: Family Medicine

## 2021-06-06 ENCOUNTER — Other Ambulatory Visit (HOSPITAL_COMMUNITY): Payer: Medicare Other

## 2021-06-07 LAB — CULTURE, BLOOD (ROUTINE X 2)
Culture: NO GROWTH
Culture: NO GROWTH
Special Requests: ADEQUATE
Special Requests: ADEQUATE

## 2021-06-17 ENCOUNTER — Ambulatory Visit: Payer: Medicare Other | Admitting: Neurology

## 2021-07-03 NOTE — Progress Notes (Signed)
Daily Progress Note   Patient Name: Kaitlyn Powell       Date: 06/20/21 DOB: [birth year]  Age: 65 y.o. MRN#: [id number] Attending Physician: Glade Lloyd, MD Primary Care Physician: Tracey Harries, MD Admit Date: 05/06/2021  Reason for Consultation/Follow-up: Establishing goals of care and Terminal Care  Patient Profile/HPI:   65 y.o. female  with past medical history of Down Syndrome/MR, Parkinson's, OSA on nocturnal BiPAPm hypothyroidism, severe persistent asthma, PPM due to heart block, dysphasia with PEG tube, seizure disorder, and hx of DVT/PE. She presented to the emergency department on 05/12/2021 with shortness of breath and weakness. In the ED, she is febrile and tachypneic. Labs are significant for WBC 2.5, hemoglobin 10.7, platelets 50. Chest x-ray is suggestive of bilateral pneumonia with possible small left pleural effusion. Initially on 15L NRB, then progressed to high flow nasal cannula, then requiring BiPAP.  Admitted to [facility] with acute respiratory failure and suspected aspiration pneumonia.  She has been transitioned to comfort measures only.  Subjective: Kaitlyn Dandy and Kaitlyn Powell are at bedside as well as [name].  Bipap has been discontinued. Kaitlyn Powell appears comfortable at this time. Discussed symptom management and titrating high flow oxygen down while using symptom management medications for comfort.  Family was expecting arrival of Father Ree Kida at 0930 but have not been visited yet.    Review of Systems  Unable to perform ROS: Acuity of condition    Physical Exam Vitals and nursing note reviewed.  Cardiovascular:     Rate and Rhythm: Normal rate.  Pulmonary:     Effort: Pulmonary effort is normal.  Neurological:     Comments: unresponsive            Vital Signs:  BP (!) 104/53 (BP Location: Right Arm)   Pulse 94   Temp 97.7 F (36.5 C) (Oral)   Resp (!) 8   Ht 4\' 11"  (1.499 m)   Wt 73 kg   SpO2 97%   BMI 32.51 kg/m  SpO2: SpO2: 97 % O2 Device: O2 Device: High Flow Nasal Cannula O2 Flow Rate: O2 Flow Rate (L/min): 20 L/min  Intake/output summary:  Intake/Output Summary (Last 24 hours) at 20-Jun-2021 1040 Last data filed at 20-Jun-2021 0300 Gross per 24 hour  Intake 430.84 ml  Output --  Net 430.84 ml   LBM: Last BM  Date:  (PTA) Baseline Weight: Weight: 73 kg Most recent weight: Weight: 73 kg       Palliative Assessment/Data: PPS: 10%        Palliative Care Assessment & Plan    Assessment/Recommendations/Plan  Continue with comfort measures only Continue to titrate high flow O2 down while utilizing morphine boluses for comfort Do not check sats. Do not titrate O2. Give benzos and/or opioids for shortness of breath, dyspnea, increased work of breathing or respiratory rate over 22.   Call PMT at [phone] for uncontrolled symptoms from 0700-1900. Outside of these hours, please contact primary team.  Appreciate Chaplain assistance with contacting Father Ree Kida     Code Status: DNR  Prognosis:  Hours - Days  Discharge Planning: Anticipated Hospital Death  Care plan was discussed with patient's family and care team.  Thank you for allowing the Palliative Medicine Team to assist in the care of this patient.  Total time:  38 minutes Prolonged billing: No     Greater than 50%  of this time was spent counseling and coordinating care related to the above assessment and plan.  Ocie Bob, AGNP-C Palliative Medicine   Please contact Palliative Medicine Team phone at [phone] for questions and concerns.

## 2021-07-03 NOTE — Discharge Summary (Signed)
Death Summary  Kaitlyn Powell ZWC:[id number] DOB: 05-Jan-1956 DOA: 2021/06/07  PCP: Tracey Harries, MD  Admit date: 06-07-21 Date of Death: 2021-06-09 Time of Death: 67   History of present illness:  65/F with history of Down syndrome, mental retardation, Parkinson's disease, dementia, asthma, dysphagia, PEG tube, OSA on BiPAP, history of DVT/PE presented with worsening respiratory distress.  On presentation, she was found to have bilateral pneumonia and severe hyponatremia.  She was started on broad-spectrum antibiotics.  Her respiratory status worsened during the hospitalization requiring BiPAP; chest x-ray showing worsening pneumonia.  After discussion with palliative care team, family decided to transition her to comfort measures and she was started on morphine drip on 06-08-2021.  She passed away on Jun 09, 2021 at 1747.  Final Diagnoses:   Comfort measures only status Acute hypoxic respiratory failure Aspiration pneumonia Sepsis: Present on admission Acute metabolic encephalopathy Hyponatremia Dysphagia Down syndrome/mental retardation/nonverbal Dementia Parkinson's disease Thrombocytopenia Pancytopenia History of seizure disorder History of pulmonary embolism   The results of significant diagnostics from this hospitalization (including imaging, microbiology, ancillary and laboratory) are listed below for reference.    Significant Diagnostic Studies: DG Chest 2 View  Result Date: 05/15/2021 CLINICAL DATA:  65 year old female with history of shortness of breath and constipation for 1 week. EXAM: CHEST - 2 VIEW COMPARISON:  Chest x-ray 05/02/2021. FINDINGS: Lung volumes are slightly low. Patchy areas of interstitial prominence an ill-defined opacities are noted throughout the lungs bilaterally, most severe in the mid to lower lungs. No pleural effusions. No pneumothorax. No cephalization of the pulmonary vasculature. Heart size is normal. Upper mediastinal contours are within  normal limits. Atherosclerotic calcifications in the thoracic aorta. Right-sided pacemaker device in place with lead tips projecting over the expected location of the right atrium and right ventricle. Multiple old healed left-sided rib fractures. IMPRESSION: 1. The appearance the chest is concerning for potential multilobar bilateral bronchopneumonia, as above. 2. Aortic atherosclerosis. Electronically Signed   By: Trudie Reed M.D.   On: 05/15/2021 14:33   DG Abd 1 View  Result Date: 05/19/2021 CLINICAL DATA:  Evaluate for possible constipation EXAM: ABDOMEN - 1 VIEW COMPARISON:  10/04/2019 FINDINGS: Scattered large and small bowel gas is noted. Gastrostomy button is noted in the stomach. Scattered fecal material is noted throughout the colon consistent with constipation. No free air is seen. Degenerative changes of lumbar spine are noted. IMPRESSION: Changes of moderate colonic constipation. Electronically Signed   By: Alcide Clever M.D.   On: 05/19/2021 00:09   CT HEAD WO CONTRAST ( )  Result Date: Jun 07, 2021 CLINICAL DATA:  Seizure, nontraumatic. Worsening mental status. Difficulty breathing. Down syndrome. Parkinson's disease. EXAM: CT HEAD WITHOUT CONTRAST TECHNIQUE: Contiguous axial images were obtained from the base of the skull through the vertex without intravenous contrast. COMPARISON:  04/05/2019 FINDINGS: Brain: Chronic brain atrophy with temporal lobe predominance. Chronic small-vessel ischemic changes throughout the cerebral hemispheric white matter. No sign of acute infarction, mass lesion, structure hydrocephalus or extra-axial collection. Vascular: There is atherosclerotic calcification of the major vessels at the base of the brain. Skull: Negative Sinuses/Orbits: Small amount of fluid in the right maxillary sinus. Other sinuses are clear. Orbits negative. Other: None IMPRESSION: Advanced chronic brain atrophy, temporal lobe predominant. Chronic small-vessel ischemic changes  throughout the white matter. No acute or reversible finding. Cerebral Atrophy (ICD10-G31.9). Air-fluid level in the right maxillary sinus suggesting rhinosinusitis. Electronically Signed   By: Paulina Fusi M.D.   On: 2021-06-07 14:06   DG CHEST PORT 1 VIEW  Result Date: 06/03/2021 CLINICAL DATA:  65 year old female with history of respiratory distress. EXAM: PORTABLE CHEST 1 VIEW COMPARISON:  Chest x-ray 06/12/2021. FINDINGS: Right-sided pacemaker device in place with lead tips projecting over the expected location of the right atrium and right ventricle. Lung volumes are normal. Patchy multifocal interstitial and airspace disease noted throughout the mid to lower lungs bilaterally, worsened slightly when compared to the prior study. No definite pleural effusions. No pneumothorax. Cephalization of the pulmonary vasculature. Heart size is normal. Upper mediastinal contours are within normal limits. Atherosclerotic calcifications in the thoracic aorta. IMPRESSION: 1. The appearance the chest is compatible with severe multilobar bilateral pneumonia, slightly worsened compared to the prior study, as above. 2. Aortic atherosclerosis. Electronically Signed   By: Trudie Reed M.D.   On: 06/03/2021 06:29   DG Chest Port 1 View  Result Date: 06/12/21 CLINICAL DATA:  Altered mental status EXAM: PORTABLE CHEST 1 VIEW COMPARISON:  Previous studies including the examination of 05/15/2021 FINDINGS: Transverse diameter of heart is in the upper limits of normal. Thoracic aorta is tortuous and ectatic. Patchy infiltrates are seen in the parahilar regions and lower lung fields, more so on the left side with interval worsening. Both hilar regions appear more prominent. Left lateral CP angle is indistinct. There is no pneumothorax. Deformities are seen in the left clavicle and left upper ribs with no significant change. Pacemaker battery is seen in right infraclavicular region. IMPRESSION: There is interval increase in  patchy infiltrates in the parahilar regions and lower lung fields, more so on the left side suggesting bilateral pneumonia. Both hilar regions and right paratracheal soft tissues appear more prominent. Possibility of lymphadenopathy is not excluded. Possible small left pleural effusion. Electronically Signed   By: Ernie Avena M.D.   On: 06/12/2021 10:11   CUP PACEART REMOTE DEVICE CHECK  Result Date: 05/16/2021 Scheduled remote reviewed. Normal device function.  6 ATR's, EGM's show narrow, regular, tachy rhythm, 1:1 Next remote 91 days. LR   Microbiology: Recent Results (from the past 240 hour(s))  Blood Culture (routine x 2)     Status: None (Preliminary result)   Collection Time: 2021/06/12  9:14 AM   Specimen: BLOOD RIGHT HAND  Result Value Ref Range Status   Specimen Description BLOOD RIGHT HAND  Final   Special Requests   Final    BOTTLES DRAWN AEROBIC AND ANAEROBIC Blood Culture adequate volume   Culture   Final    NO GROWTH 3 DAYS Performed at [facility], [address], [place], [place] [address]    Report Status PENDING  Incomplete  Blood Culture (routine x 2)     Status: None (Preliminary result)   Collection Time: 06/12/2021  9:14 AM   Specimen: BLOOD LEFT HAND  Result Value Ref Range Status   Specimen Description BLOOD LEFT HAND  Final   Special Requests   Final    BOTTLES DRAWN AEROBIC AND ANAEROBIC Blood Culture adequate volume   Culture   Final    NO GROWTH 3 DAYS Performed at [facility], [address], [place], [place] [address]    Report Status PENDING  Incomplete  Resp Panel by RT-PCR (Flu A&B, Covid) Nasopharyngeal Swab     Status: None   Collection Time: 06-12-21  9:32 AM   Specimen: Nasopharyngeal Swab; Nasopharyngeal(NP) swabs in vial transport medium  Result Value Ref Range Status   SARS Coronavirus 2 by RT PCR NEGATIVE NEGATIVE Final    Comment: (NOTE) SARS-CoV-2  target nucleic acids are NOT DETECTED.  The SARS-CoV-2 RNA is  generally detectable in upper respiratory specimens during the acute phase of infection. The lowest concentration of SARS-CoV-2 viral copies this assay can detect is 138 copies/mL. A negative result does not preclude SARS-Cov-2 infection and should not be used as the sole basis for treatment or other patient management decisions. A negative result may occur with  improper specimen collection/handling, submission of specimen other than nasopharyngeal swab, presence of viral mutation(s) within the areas targeted by this assay, and inadequate number of viral copies(<138 copies/mL). A negative result must be combined with clinical observations, patient history, and epidemiological information. The expected result is Negative.  Fact Sheet for Patients:  BloggerCourse.com  Fact Sheet for Healthcare Providers:  SeriousBroker.it  This test is no t yet approved or cleared by the [place] FDA and  has been authorized for detection and/or diagnosis of SARS-CoV-2 by FDA under an Emergency Use Authorization (EUA). This EUA will remain  in effect (meaning this test can be used) for the duration of the COVID-19 declaration under Section 564(b)(1) of the Act, 21 U.S.C.section 360bbb-3(b)(1), unless the authorization is terminated  or revoked sooner.       Influenza A by PCR NEGATIVE NEGATIVE Final   Influenza B by PCR NEGATIVE NEGATIVE Final    Comment: (NOTE) The Xpert Xpress SARS-CoV-2/FLU/RSV plus assay is intended as an aid in the diagnosis of influenza from Nasopharyngeal swab specimens and should not be used as a sole basis for treatment. Nasal washings and aspirates are unacceptable for Xpert Xpress SARS-CoV-2/FLU/RSV testing.  Fact Sheet for Patients: BloggerCourse.com  Fact Sheet for Healthcare Providers: SeriousBroker.it  This test is not yet approved or cleared by the [place] FDA and has been authorized for detection and/or diagnosis of SARS-CoV-2 by FDA under an Emergency Use Authorization (EUA). This EUA will remain in effect (meaning this test can be used) for the duration of the COVID-19 declaration under Section 564(b)(1) of the Act, 21 U.S.C. section 360bbb-3(b)(1), unless the authorization is terminated or revoked.  Performed at [facility], [address] [place], [place] [address]   Respiratory (~20 pathogens) panel by PCR     Status: None   Collection Time: 06/01/2021 12:44 PM   Specimen: Nasopharyngeal Swab; Respiratory  Result Value Ref Range Status   Adenovirus NOT DETECTED NOT DETECTED Final   Coronavirus 229E NOT DETECTED NOT DETECTED Final    Comment: (NOTE) The Coronavirus on the Respiratory Panel, DOES NOT test for the novel  Coronavirus (2019 nCoV)    Coronavirus HKU1 NOT DETECTED NOT DETECTED Final   Coronavirus NL63 NOT DETECTED NOT DETECTED Final   Coronavirus OC43 NOT DETECTED NOT DETECTED Final   Metapneumovirus NOT DETECTED NOT DETECTED Final   Rhinovirus / Enterovirus NOT DETECTED NOT DETECTED Final   Influenza A NOT DETECTED NOT DETECTED Final   Influenza B NOT DETECTED NOT DETECTED Final   Parainfluenza Virus 1 NOT DETECTED NOT DETECTED Final   Parainfluenza Virus 2 NOT DETECTED NOT DETECTED Final   Parainfluenza Virus 3 NOT DETECTED NOT DETECTED Final   Parainfluenza Virus 4 NOT DETECTED NOT DETECTED Final   Respiratory Syncytial Virus NOT DETECTED NOT DETECTED Final   Bordetella pertussis NOT DETECTED NOT DETECTED Final   Bordetella Parapertussis NOT DETECTED NOT DETECTED Final   Chlamydophila pneumoniae NOT DETECTED NOT DETECTED Final   Mycoplasma pneumoniae NOT DETECTED NOT DETECTED Final    Comment: Performed at [facility], [address] [place], [place] [address]  Urine Culture     Status: None   Collection Time: 05/16/2021 12:45 PM   Specimen: In/Out Cath Urine  Result Value Ref Range Status    Specimen Description IN/OUT CATH URINE  Final   Special Requests NONE  Final   Culture   Final    NO GROWTH Performed at [facility], [address] [place], [place] [address]    Report Status 06/03/2021 FINAL  Final     Labs: Basic Metabolic Panel: Recent Labs  Lab 05/06/2021 0915 05/11/2021 1417 06/01/2021 1832 05/07/2021 2230 06/03/21 1123  NA 119*  118* 125* 125* 124* 126*  K 5.0  --   --   --  4.6  CL 85*  --   --   --  96*  CO2 24  --   --   --  21*  GLUCOSE 106*  --   --   --  113*  BUN 38*  --   --   --  34*  CREATININE 0.96  --   --   --  0.91  CALCIUM 7.3*  --   --   --  7.8*   Liver Function Tests: Recent Labs  Lab 05/20/2021 0915  AST 86*  ALT 8  ALKPHOS 65  BILITOT 0.5  PROT 6.5  ALBUMIN 2.2*   No results for input(s): LIPASE, AMYLASE in the last 168 hours. No results for input(s): AMMONIA in the last 168 hours. CBC: Recent Labs  Lab 05/03/2021 0915 06/03/21 1123  WBC 2.5* 2.4*  NEUTROABS 2.0  --   HGB 10.7* 9.9*  HCT 30.7* 29.2*  MCV 99.0 100.7*  PLT 50* 48*   Cardiac Enzymes: No results for input(s): CKTOTAL, CKMB, CKMBINDEX, TROPONINI in the last 168 hours. D-Dimer No results for input(s): DDIMER in the last 72 hours. BNP: Invalid input(s): POCBNP CBG: No results for input(s): GLUCAP in the last 168 hours. Anemia work up No results for input(s): VITAMINB12, FOLATE, FERRITIN, TIBC, IRON, RETICCTPCT in the last 72 hours. Urinalysis    Component Value Date/Time   COLORURINE YELLOW 05/16/2021 1200   APPEARANCEUR HAZY (A) 05/24/2021 1200   APPEARANCEUR Turbid (A) 06/21/2018 1014   LABSPEC 1.013 05/29/2021 1200   PHURINE 5.0 05/11/2021 1200   GLUCOSEU NEGATIVE 05/11/2021 1200   GLUCOSEU NEGATIVE 05/08/2020 0926   HGBUR SMALL (A) 05/11/2021 1200   BILIRUBINUR NEGATIVE 05/30/2021 1200   BILIRUBINUR Negative 06/21/2018 1014   KETONESUR 5 (A) 05/10/2021 1200   PROTEINUR 30 (A) 05/15/2021 1200   UROBILINOGEN 0.2 05/08/2020 0926    NITRITE NEGATIVE 05/07/2021 1200   LEUKOCYTESUR TRACE (A) 05/12/2021 1200   Sepsis Labs Invalid input(s): PROCALCITONIN,  WBC,  LACTICIDVEN     SIGNED:  Glade Lloyd, MD  [facility] 06/05/2021, 1:28 PM

## 2021-07-03 NOTE — Progress Notes (Signed)
Pt deceased with official time of death 49. Family at bedside. Glade Lloyd, MD notified. [facility] donor services called with April Shore providing case number: [id number]. Patient is not a candidate for donor services.

## 2021-07-03 NOTE — Progress Notes (Signed)
This chaplain is appreciative of the communication from the PMT NP-Kasie.  The chaplain phoned Father Ree Kida and confirmed today's visit around 11am. The chaplain updated the PMT, RN, and family.  The Pt. family is appreciative of the F/U and communication.    This chaplain is available for F/U EOL spiritual care as needed.  Chaplain Stephanie Acre [phone]

## 2021-07-03 NOTE — Progress Notes (Signed)
The patient's care has been transitioned to a focus on comfort. No further nephrology needs so we will sign off.

## 2021-07-03 NOTE — Progress Notes (Signed)
Patient ID: Kaitlyn Powell, female   DOB: 1956-07-16, 65 y.o.   MRN: [id number]  PROGRESS NOTE    Kaitlyn Powell  TML:[id number] DOB: [birth year] DOA: 2021-06-29 PCP: Tracey Harries, MD   Brief Narrative:  65/F with history of Down syndrome, mental retardation, Parkinson's disease, dementia, asthma, dysphagia, PEG tube, OSA on BiPAP, history of DVT/PE presented with worsening respiratory distress.  On presentation, she was found to have bilateral pneumonia and severe hyponatremia.  She was started on broad-spectrum antibiotics.  Her respiratory status worsened during the hospitalization requiring BiPAP; chest x-ray showing worsening pneumonia.  After discussion with palliative care team, family decided to transition her to comfort measures and she was started on morphine drip on 06/03/2021.  Assessment & Plan:   Comfort measures only status Acute hypoxic respiratory failure Aspiration pneumonia Sepsis: Present on admission Acute metabolic encephalopathy Hyponatremia Dysphagia Down syndrome/mental retardation/nonverbal Dementia Parkinson's disease Thrombocytopenia Pancytopenia History of seizure disorder History of pulmonary embolism  Plan -As discussed above,  after discussion with palliative care team, family decided to transition her to comfort measures and she was started on morphine drip on 06/03/2021. -Currently on BiPAP but will transition to high flow nasal cannula oxygen if able to tolerate.  Palliative care following.  Prognosis is very poor.  Expect in-hospital death.   DVT prophylaxis: None per comfort measures Code Status: DNR Family Communication: Sister at bedside Disposition Plan: Status is: Inpatient  Remains inpatient appropriate because: Expect in-hospital death  Consultants: Palliative care  Procedures: None  Antimicrobials:  Anti-infectives (From admission, onward)    Start     Dose/Rate Route Frequency Ordered Stop   06-29-21 2000   piperacillin-tazobactam (ZOSYN) IVPB 3.375 g  Status:  Discontinued        3.375 g 12.5 mL/hr over 240 Minutes Intravenous Every 8 hours Jun 29, 2021 1631 06/03/21 1449   06-29-21 1130  piperacillin-tazobactam (ZOSYN) IVPB 3.375 g        3.375 g 100 mL/hr over 30 Minutes Intravenous  Once 2021/06/29 1118 06/29/2021 1307        Subjective: Patient seen and examined at bedside.  Currently on BiPAP.  Sister at bedside states that patient looks comfortable.  No seizures reported.  Objective: Vitals:   06/03/21 1934 06/03/2021 0412 07/01/2021 0736 06/10/2021 1040  BP: (!) 104/53     Pulse: 66 94    Resp: 16 12 (!) 8   Temp: 97.7 F (36.5 C)     TempSrc: Oral     SpO2: 97%   97%  Weight:      Height:        Intake/Output Summary (Last 24 hours) at 06/03/2021 1123 Last data filed at 06/06/2021 0300 Gross per 24 hour  Intake 430.84 ml  Output --  Net 430.84 ml   Filed Weights   June 29, 2021 0926  Weight: 73 kg    Examination:  General exam: Appears chronically ill and deconditioned.  Currently on BiPAP.    Data Reviewed: I have personally reviewed following labs and imaging studies  CBC: Recent Labs  Lab 06-29-21 0915 06/03/21 1123  WBC 2.5* 2.4*  NEUTROABS 2.0  --   HGB 10.7* 9.9*  HCT 30.7* 29.2*  MCV 99.0 100.7*  PLT 50* 48*   Basic Metabolic Panel: Recent Labs  Lab 2021-06-29 0915 2021/06/29 1417 06/29/2021 1832 06/29/2021 2230 06/03/21 1123  NA 119*  118* 125* 125* 124* 126*  K 5.0  --   --   --  4.6  CL 85*  --   --   --  96*  CO2 24  --   --   --  21*  GLUCOSE 106*  --   --   --  113*  BUN 38*  --   --   --  34*  CREATININE 0.96  --   --   --  0.91  CALCIUM 7.3*  --   --   --  7.8*   GFR: Estimated Creatinine Clearance: 53.6 mL/min (by C-G formula based on SCr of 0.91 mg/dL). Liver Function Tests: Recent Labs  Lab 06-19-21 0915  AST 86*  ALT 8  ALKPHOS 65  BILITOT 0.5  PROT 6.5  ALBUMIN 2.2*   No results for input(s): LIPASE, AMYLASE in the last 168  hours. No results for input(s): AMMONIA in the last 168 hours. Coagulation Profile: Recent Labs  Lab Jun 19, 2021 0915  INR 1.4*   Cardiac Enzymes: No results for input(s): CKTOTAL, CKMB, CKMBINDEX, TROPONINI in the last 168 hours. BNP (last 3 results) No results for input(s): PROBNP in the last 8760 hours. HbA1C: No results for input(s): HGBA1C in the last 72 hours. CBG: No results for input(s): GLUCAP in the last 168 hours. Lipid Profile: No results for input(s): CHOL, HDL, LDLCALC, TRIG, CHOLHDL, LDLDIRECT in the last 72 hours. Thyroid Function Tests: Recent Labs    Jun 19, 2021 0915  TSH 0.328*   Anemia Panel: No results for input(s): VITAMINB12, FOLATE, FERRITIN, TIBC, IRON, RETICCTPCT in the last 72 hours. Sepsis Labs: Recent Labs  Lab 2021/06/19 0915 06/03/21 1123  PROCALCITON 1.50 0.98  LATICACIDVEN 1.1  --     Recent Results (from the past 240 hour(s))  Blood Culture (routine x 2)     Status: None (Preliminary result)   Collection Time: 19-Jun-2021  9:14 AM   Specimen: BLOOD RIGHT HAND  Result Value Ref Range Status   Specimen Description BLOOD RIGHT HAND  Final   Special Requests   Final    BOTTLES DRAWN AEROBIC AND ANAEROBIC Blood Culture adequate volume   Culture   Final    NO GROWTH 2 DAYS Performed at [facility], [address], [place], [place] [address]    Report Status PENDING  Incomplete  Blood Culture (routine x 2)     Status: None (Preliminary result)   Collection Time: 06/19/21  9:14 AM   Specimen: BLOOD LEFT HAND  Result Value Ref Range Status   Specimen Description BLOOD LEFT HAND  Final   Special Requests   Final    BOTTLES DRAWN AEROBIC AND ANAEROBIC Blood Culture adequate volume   Culture   Final    NO GROWTH 2 DAYS Performed at [facility], [address] [place], [place] [address]    Report Status PENDING  Incomplete  Resp Panel by RT-PCR (Flu A&B, Covid) Nasopharyngeal Swab     Status: None   Collection Time: 06/19/2021  9:32  AM   Specimen: Nasopharyngeal Swab; Nasopharyngeal(NP) swabs in vial transport medium  Result Value Ref Range Status   SARS Coronavirus 2 by RT PCR NEGATIVE NEGATIVE Final    Comment: (NOTE) SARS-CoV-2 target nucleic acids are NOT DETECTED.  The SARS-CoV-2 RNA is generally detectable in upper respiratory specimens during the acute phase of infection. The lowest concentration of SARS-CoV-2 viral copies this assay can detect is 138 copies/mL. A negative result does not preclude SARS-Cov-2 infection and should not be used as the sole basis for treatment or other patient management decisions. A negative result may occur with  improper specimen collection/handling, submission  of specimen other than nasopharyngeal swab, presence of viral mutation(s) within the areas targeted by this assay, and inadequate number of viral copies(<138 copies/mL). A negative result must be combined with clinical observations, patient history, and epidemiological information. The expected result is Negative.  Fact Sheet for Patients:  BloggerCourse.com  Fact Sheet for Healthcare Providers:  SeriousBroker.it  This test is no t yet approved or cleared by the [place] FDA and  has been authorized for detection and/or diagnosis of SARS-CoV-2 by FDA under an Emergency Use Authorization (EUA). This EUA will remain  in effect (meaning this test can be used) for the duration of the COVID-19 declaration under Section 564(b)(1) of the Act, 21 U.S.C.section 360bbb-3(b)(1), unless the authorization is terminated  or revoked sooner.       Influenza A by PCR NEGATIVE NEGATIVE Final   Influenza B by PCR NEGATIVE NEGATIVE Final    Comment: (NOTE) The Xpert Xpress SARS-CoV-2/FLU/RSV plus assay is intended as an aid in the diagnosis of influenza from Nasopharyngeal swab specimens and should not be used as a sole basis for treatment. Nasal washings and aspirates are  unacceptable for Xpert Xpress SARS-CoV-2/FLU/RSV testing.  Fact Sheet for Patients: BloggerCourse.com  Fact Sheet for Healthcare Providers: SeriousBroker.it  This test is not yet approved or cleared by the [place] FDA and has been authorized for detection and/or diagnosis of SARS-CoV-2 by FDA under an Emergency Use Authorization (EUA). This EUA will remain in effect (meaning this test can be used) for the duration of the COVID-19 declaration under Section 564(b)(1) of the Act, 21 U.S.C. section 360bbb-3(b)(1), unless the authorization is terminated or revoked.  Performed at [facility], [address], [place], [place] [address]   Respiratory (~20 pathogens) panel by PCR     Status: None   Collection Time: 05/28/2021 12:44 PM   Specimen: Nasopharyngeal Swab; Respiratory  Result Value Ref Range Status   Adenovirus NOT DETECTED NOT DETECTED Final   Coronavirus 229E NOT DETECTED NOT DETECTED Final    Comment: (NOTE) The Coronavirus on the Respiratory Panel, DOES NOT test for the novel  Coronavirus (2019 nCoV)    Coronavirus HKU1 NOT DETECTED NOT DETECTED Final   Coronavirus NL63 NOT DETECTED NOT DETECTED Final   Coronavirus OC43 NOT DETECTED NOT DETECTED Final   Metapneumovirus NOT DETECTED NOT DETECTED Final   Rhinovirus / Enterovirus NOT DETECTED NOT DETECTED Final   Influenza A NOT DETECTED NOT DETECTED Final   Influenza B NOT DETECTED NOT DETECTED Final   Parainfluenza Virus 1 NOT DETECTED NOT DETECTED Final   Parainfluenza Virus 2 NOT DETECTED NOT DETECTED Final   Parainfluenza Virus 3 NOT DETECTED NOT DETECTED Final   Parainfluenza Virus 4 NOT DETECTED NOT DETECTED Final   Respiratory Syncytial Virus NOT DETECTED NOT DETECTED Final   Bordetella pertussis NOT DETECTED NOT DETECTED Final   Bordetella Parapertussis NOT DETECTED NOT DETECTED Final   Chlamydophila pneumoniae NOT DETECTED NOT DETECTED Final    Mycoplasma pneumoniae NOT DETECTED NOT DETECTED Final    Comment: Performed at [facility], [address], [place], [place] [address]  Urine Culture     Status: None   Collection Time: 05/03/2021 12:45 PM   Specimen: In/Out Cath Urine  Result Value Ref Range Status   Specimen Description IN/OUT CATH URINE  Final   Special Requests NONE  Final   Culture   Final    NO GROWTH Performed at [facility], [address], [place],  [place] [address]    Report Status 06/03/2021 FINAL  Final         Radiology Studies: CT HEAD WO CONTRAST ( )  Result Date: 05/29/2021 CLINICAL DATA:  Seizure, nontraumatic. Worsening mental status. Difficulty breathing. Down syndrome. Parkinson's disease. EXAM: CT HEAD WITHOUT CONTRAST TECHNIQUE: Contiguous axial images were obtained from the base of the skull through the vertex without intravenous contrast. COMPARISON:  04/05/2019 FINDINGS: Brain: Chronic brain atrophy with temporal lobe predominance. Chronic small-vessel ischemic changes throughout the cerebral hemispheric white matter. No sign of acute infarction, mass lesion, structure hydrocephalus or extra-axial collection. Vascular: There is atherosclerotic calcification of the major vessels at the base of the brain. Skull: Negative Sinuses/Orbits: Small amount of fluid in the right maxillary sinus. Other sinuses are clear. Orbits negative. Other: None IMPRESSION: Advanced chronic brain atrophy, temporal lobe predominant. Chronic small-vessel ischemic changes throughout the white matter. No acute or reversible finding. Cerebral Atrophy (ICD10-G31.9). Air-fluid level in the right maxillary sinus suggesting rhinosinusitis. Electronically Signed   By: Paulina Fusi M.D.   On: 05/09/2021 14:06   DG CHEST PORT 1 VIEW  Result Date: 06/03/2021 CLINICAL DATA:  65 year old female with history of respiratory distress. EXAM: PORTABLE CHEST 1 VIEW COMPARISON:  Chest x-ray 05/05/2021. FINDINGS: Right-sided  pacemaker device in place with lead tips projecting over the expected location of the right atrium and right ventricle. Lung volumes are normal. Patchy multifocal interstitial and airspace disease noted throughout the mid to lower lungs bilaterally, worsened slightly when compared to the prior study. No definite pleural effusions. No pneumothorax. Cephalization of the pulmonary vasculature. Heart size is normal. Upper mediastinal contours are within normal limits. Atherosclerotic calcifications in the thoracic aorta. IMPRESSION: 1. The appearance the chest is compatible with severe multilobar bilateral pneumonia, slightly worsened compared to the prior study, as above. 2. Aortic atherosclerosis. Electronically Signed   By: Trudie Reed M.D.   On: 06/03/2021 06:29        Scheduled Meds:  levETIRAcetam  1,000 mg Per Tube Q2200   And   levETIRAcetam  500 mg Per Tube q morning   phenytoin  200 mg Per Tube Q2200   And   phenytoin  125 mg Per Tube q morning   sodium chloride  1 drop Both Eyes QHS   Continuous Infusions:  morphine 5 mg/hr (2021/06/13 6384)          Glade Lloyd, MD [facility] Jun 13, 2021, 11:23 AM

## 2021-07-03 DEATH — deceased

## 2021-07-07 ENCOUNTER — Ambulatory Visit: Payer: Medicare Other | Admitting: Pulmonary Disease

## 2021-09-15 ENCOUNTER — Ambulatory Visit: Payer: Medicare Other | Admitting: Neurology
